# Patient Record
Sex: Male | Born: 1940 | Race: White | Hispanic: No | Marital: Married | Smoking: Former smoker
Health system: Southern US, Community
[De-identification: ages and names within clinical notes are randomized; demographics above are authoritative.]

## PROBLEM LIST (undated history)

## (undated) DIAGNOSIS — I2510 Atherosclerotic heart disease of native coronary artery without angina pectoris: Secondary | ICD-10-CM

## (undated) DIAGNOSIS — J189 Pneumonia, unspecified organism: Secondary | ICD-10-CM

## (undated) DIAGNOSIS — I10 Essential (primary) hypertension: Secondary | ICD-10-CM

## (undated) DIAGNOSIS — I350 Nonrheumatic aortic (valve) stenosis: Secondary | ICD-10-CM

## (undated) DIAGNOSIS — R011 Cardiac murmur, unspecified: Secondary | ICD-10-CM

## (undated) DIAGNOSIS — Z9289 Personal history of other medical treatment: Secondary | ICD-10-CM

## (undated) DIAGNOSIS — K922 Gastrointestinal hemorrhage, unspecified: Secondary | ICD-10-CM

## (undated) DIAGNOSIS — IMO0002 Reserved for concepts with insufficient information to code with codable children: Secondary | ICD-10-CM

## (undated) DIAGNOSIS — Z992 Dependence on renal dialysis: Secondary | ICD-10-CM

## (undated) DIAGNOSIS — Z955 Presence of coronary angioplasty implant and graft: Secondary | ICD-10-CM

## (undated) DIAGNOSIS — N186 End stage renal disease: Secondary | ICD-10-CM

## (undated) DIAGNOSIS — I4891 Unspecified atrial fibrillation: Secondary | ICD-10-CM

## (undated) DIAGNOSIS — D649 Anemia, unspecified: Secondary | ICD-10-CM

## (undated) DIAGNOSIS — I219 Acute myocardial infarction, unspecified: Secondary | ICD-10-CM

## (undated) DIAGNOSIS — K5520 Angiodysplasia of colon without hemorrhage: Secondary | ICD-10-CM

## (undated) DIAGNOSIS — K222 Esophageal obstruction: Secondary | ICD-10-CM

## (undated) DIAGNOSIS — K219 Gastro-esophageal reflux disease without esophagitis: Secondary | ICD-10-CM

## (undated) DIAGNOSIS — E7800 Pure hypercholesterolemia, unspecified: Secondary | ICD-10-CM

## (undated) DIAGNOSIS — M109 Gout, unspecified: Secondary | ICD-10-CM

## (undated) DIAGNOSIS — I701 Atherosclerosis of renal artery: Secondary | ICD-10-CM

## (undated) DIAGNOSIS — M1990 Unspecified osteoarthritis, unspecified site: Secondary | ICD-10-CM

## (undated) HISTORY — PX: OTHER SURGICAL HISTORY: SHX169

## (undated) HISTORY — DX: Acute myocardial infarction, unspecified: I21.9

## (undated) HISTORY — PX: DIALYSIS FISTULA CREATION: SHX611

## (undated) HISTORY — DX: Gastro-esophageal reflux disease without esophagitis: K21.9

## (undated) HISTORY — PX: ESOPHAGOGASTRODUODENOSCOPY (EGD) WITH ESOPHAGEAL DILATION: SHX5812

## (undated) HISTORY — PX: CORONARY ANGIOPLASTY WITH STENT PLACEMENT: SHX49

## (undated) HISTORY — DX: Angiodysplasia of colon without hemorrhage: K55.20

## (undated) HISTORY — DX: Atherosclerosis of renal artery: I70.1

## (undated) HISTORY — DX: Nonrheumatic aortic (valve) stenosis: I35.0

## (undated) HISTORY — DX: Unspecified osteoarthritis, unspecified site: M19.90

## (undated) HISTORY — DX: Anemia, unspecified: D64.9

## (undated) HISTORY — DX: Pure hypercholesterolemia, unspecified: E78.00

## (undated) HISTORY — PX: DOPPLER ECHOCARDIOGRAPHY: SHX263

## (undated) HISTORY — PX: URETERAL STENT PLACEMENT: SHX822

## (undated) HISTORY — DX: Essential (primary) hypertension: I10

## (undated) HISTORY — DX: Cardiac murmur, unspecified: R01.1

---

## 1996-08-22 HISTORY — PX: RENAL ARTERY STENT: SHX2321

## 2003-07-24 ENCOUNTER — Ambulatory Visit (HOSPITAL_COMMUNITY): Admission: RE | Admit: 2003-07-24 | Discharge: 2003-07-24 | Payer: Self-pay | Admitting: Pulmonary Disease

## 2003-12-28 ENCOUNTER — Encounter: Admission: RE | Admit: 2003-12-28 | Discharge: 2003-12-28 | Payer: Self-pay | Admitting: Cardiovascular Disease

## 2004-01-02 ENCOUNTER — Ambulatory Visit (HOSPITAL_COMMUNITY): Admission: RE | Admit: 2004-01-02 | Discharge: 2004-01-02 | Payer: Self-pay | Admitting: Cardiovascular Disease

## 2004-01-02 HISTORY — PX: RENAL ANGIOGRAM: SHX6061

## 2004-02-20 ENCOUNTER — Ambulatory Visit (HOSPITAL_COMMUNITY): Admission: RE | Admit: 2004-02-20 | Discharge: 2004-02-20 | Payer: Self-pay | Admitting: Vascular Surgery

## 2005-09-12 ENCOUNTER — Emergency Department (HOSPITAL_COMMUNITY): Admission: EM | Admit: 2005-09-12 | Discharge: 2005-09-12 | Payer: Self-pay | Admitting: Emergency Medicine

## 2007-03-24 ENCOUNTER — Encounter (HOSPITAL_COMMUNITY): Admission: RE | Admit: 2007-03-24 | Discharge: 2007-06-22 | Payer: Self-pay | Admitting: Nephrology

## 2009-03-19 ENCOUNTER — Ambulatory Visit (HOSPITAL_COMMUNITY): Admission: RE | Admit: 2009-03-19 | Discharge: 2009-03-19 | Payer: Self-pay | Admitting: Nephrology

## 2009-04-02 ENCOUNTER — Ambulatory Visit (HOSPITAL_COMMUNITY): Admission: RE | Admit: 2009-04-02 | Discharge: 2009-04-02 | Payer: Self-pay | Admitting: Nephrology

## 2009-04-13 HISTORY — PX: COLONOSCOPY: SHX174

## 2009-04-16 ENCOUNTER — Inpatient Hospital Stay (HOSPITAL_COMMUNITY): Admission: RE | Admit: 2009-04-16 | Discharge: 2009-04-17 | Payer: Self-pay | Admitting: Nephrology

## 2009-04-17 ENCOUNTER — Encounter (INDEPENDENT_AMBULATORY_CARE_PROVIDER_SITE_OTHER): Payer: Self-pay | Admitting: Nephrology

## 2009-10-01 DIAGNOSIS — Z955 Presence of coronary angioplasty implant and graft: Secondary | ICD-10-CM

## 2009-10-01 HISTORY — DX: Presence of coronary angioplasty implant and graft: Z95.5

## 2010-03-13 HISTORY — PX: ESOPHAGOGASTRODUODENOSCOPY: SHX1529

## 2010-03-15 ENCOUNTER — Encounter: Payer: Self-pay | Admitting: Emergency Medicine

## 2010-03-15 ENCOUNTER — Inpatient Hospital Stay (HOSPITAL_COMMUNITY)
Admission: EM | Admit: 2010-03-15 | Discharge: 2010-03-21 | Payer: Self-pay | Source: Home / Self Care | Attending: Cardiovascular Disease | Admitting: Cardiovascular Disease

## 2010-04-08 ENCOUNTER — Encounter (HOSPITAL_COMMUNITY)
Admission: RE | Admit: 2010-04-08 | Discharge: 2010-05-08 | Payer: Self-pay | Source: Home / Self Care | Attending: Family Medicine | Admitting: Family Medicine

## 2010-04-08 ENCOUNTER — Ambulatory Visit
Admission: RE | Admit: 2010-04-08 | Discharge: 2010-04-08 | Payer: Self-pay | Source: Home / Self Care | Attending: Internal Medicine | Admitting: Internal Medicine

## 2010-04-09 ENCOUNTER — Telehealth (INDEPENDENT_AMBULATORY_CARE_PROVIDER_SITE_OTHER): Payer: Self-pay

## 2010-04-09 ENCOUNTER — Ambulatory Visit (HOSPITAL_COMMUNITY)
Admission: RE | Admit: 2010-04-09 | Discharge: 2010-05-13 | Payer: Self-pay | Source: Home / Self Care | Attending: Family Medicine | Admitting: Family Medicine

## 2010-04-09 ENCOUNTER — Encounter: Payer: Self-pay | Admitting: Internal Medicine

## 2010-04-11 ENCOUNTER — Ambulatory Visit (HOSPITAL_COMMUNITY)
Admission: RE | Admit: 2010-04-11 | Discharge: 2010-04-11 | Payer: Self-pay | Source: Home / Self Care | Attending: Internal Medicine | Admitting: Internal Medicine

## 2010-04-15 ENCOUNTER — Encounter (INDEPENDENT_AMBULATORY_CARE_PROVIDER_SITE_OTHER): Payer: Self-pay

## 2010-04-15 ENCOUNTER — Encounter: Payer: Self-pay | Admitting: Urgent Care

## 2010-04-15 ENCOUNTER — Encounter: Payer: Self-pay | Admitting: Internal Medicine

## 2010-04-16 HISTORY — PX: GIVENS CAPSULE STUDY: SHX5432

## 2010-04-22 ENCOUNTER — Encounter: Payer: Self-pay | Admitting: Urgent Care

## 2010-04-24 IMAGING — CR DG CHEST 2V
2 series · 2 of 2 positions shown · non-contrast
Comparison: 04/02/2009

CLINICAL DATA: Pneumonia.  Cough.  Short of breath.  Anemia.

CHEST - 2 VIEW

[w chest pa]
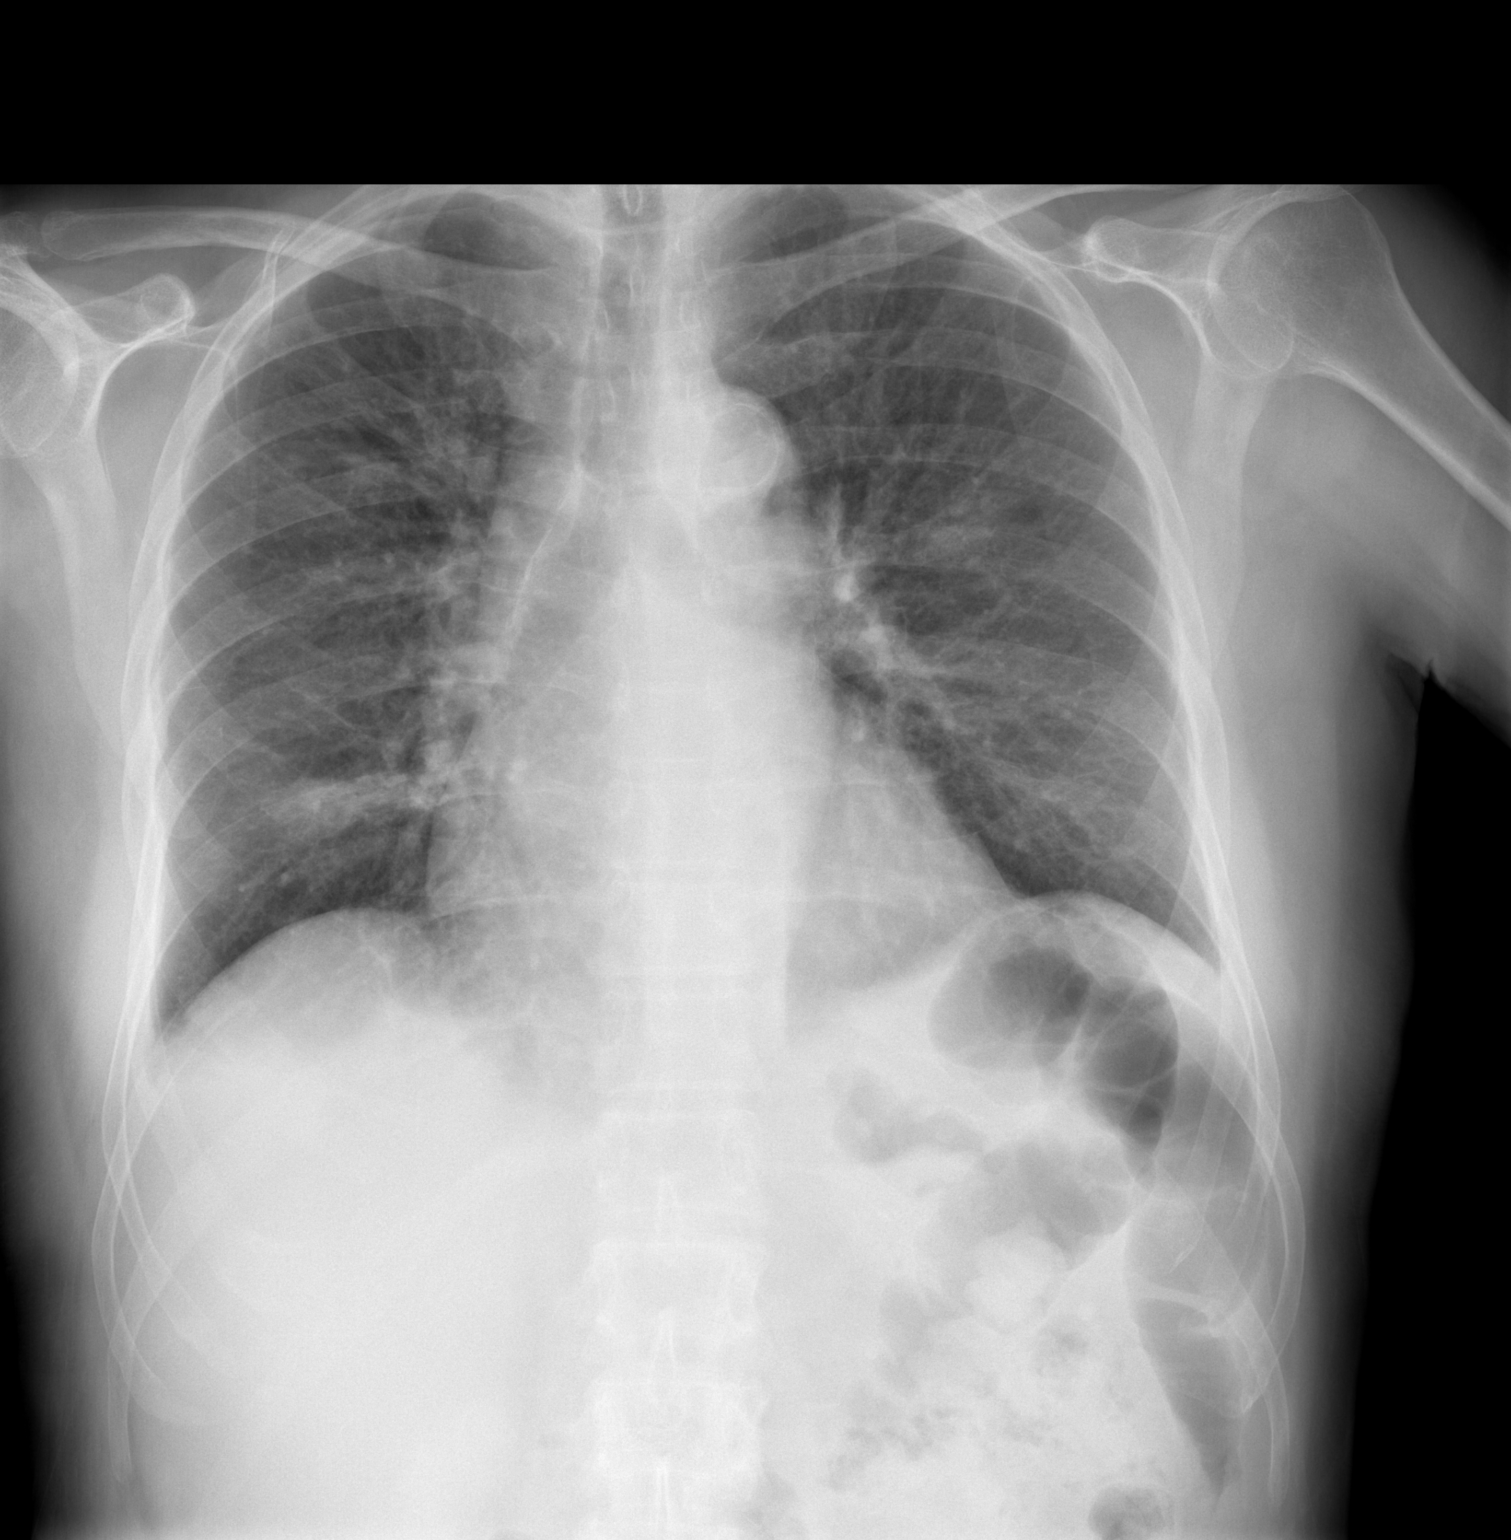

[w chest lat]
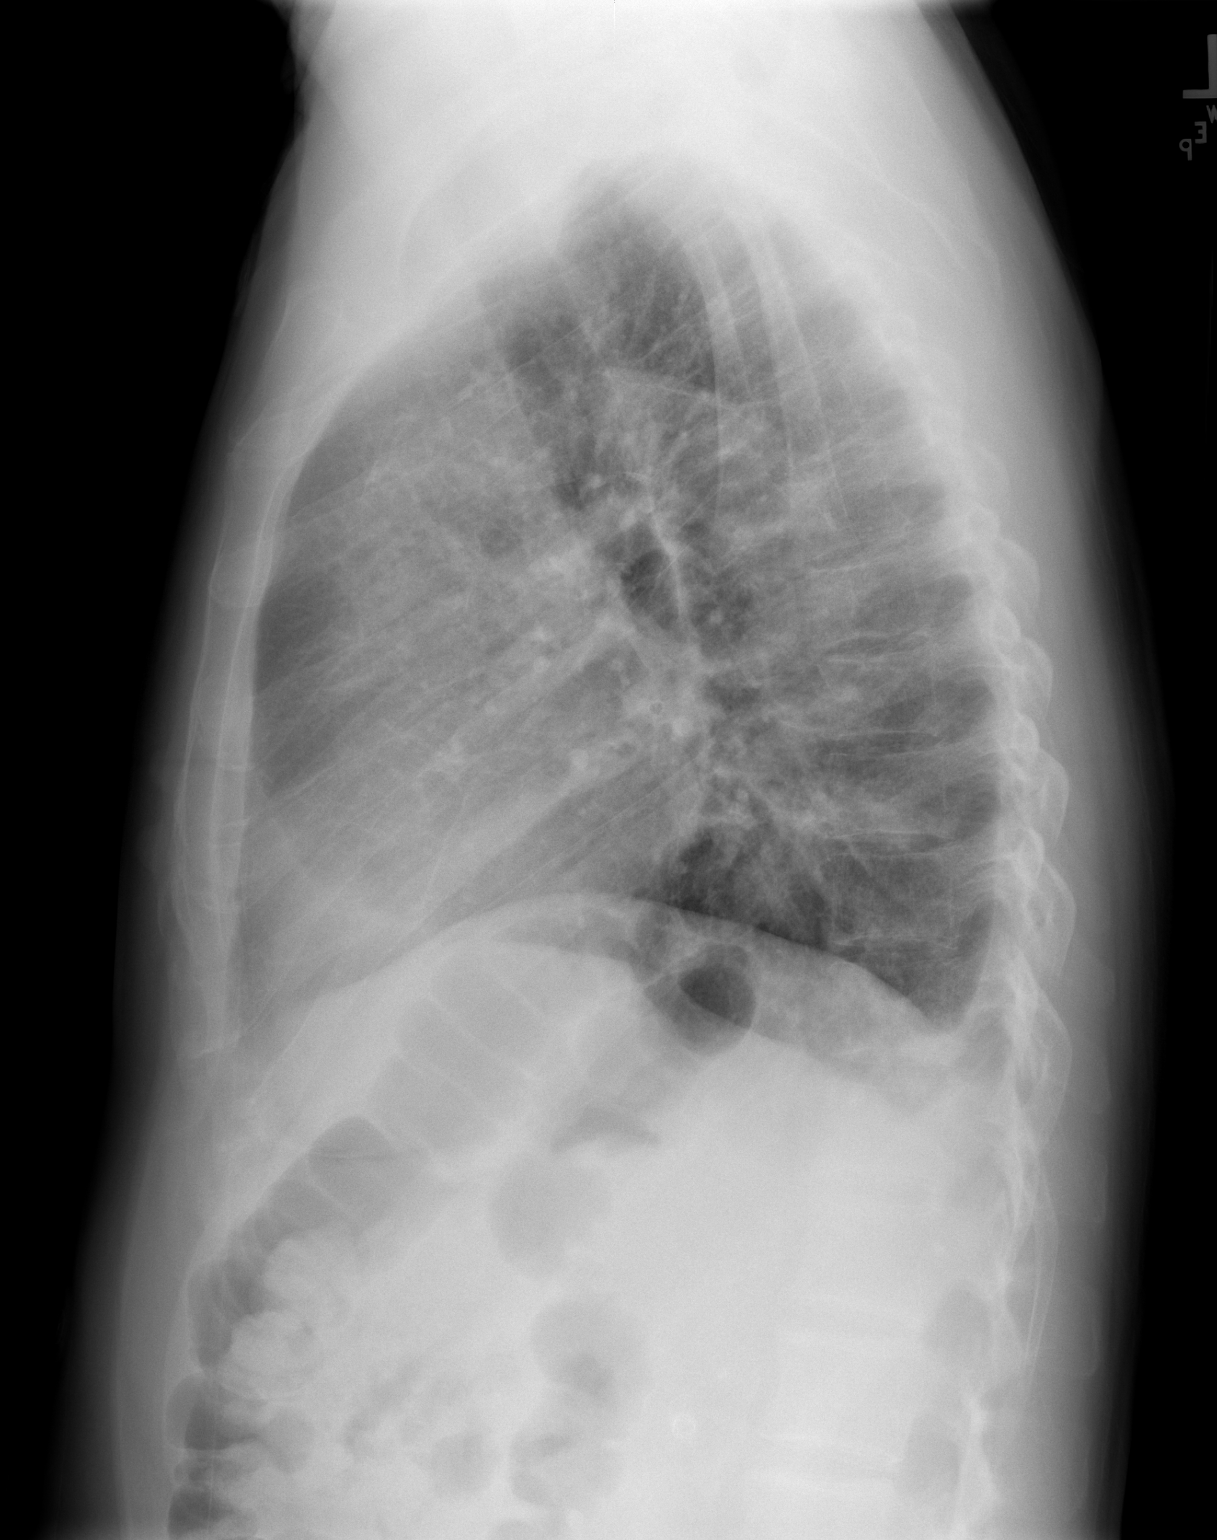

[2 of 2 positions shown; findings below may reference images not displayed]

FINDINGS: Mild infiltrate persists in the right lower lobe, and is
without significant change.  Tiny right pleural effusion is also
unchanged.  Left lung remains clear.  Heart size and mediastinal
contours are stable and within normal limits.
IMPRESSION: Mild right lower lobe infiltrate and tiny right pleural effusion,
without significant interval change.

## 2010-05-02 HISTORY — PX: NM MYOCAR PERF WALL MOTION: HXRAD629

## 2010-05-06 ENCOUNTER — Encounter (INDEPENDENT_AMBULATORY_CARE_PROVIDER_SITE_OTHER): Payer: Self-pay | Admitting: *Deleted

## 2010-05-13 NOTE — Op Note (Signed)
  NAME:  Roger [name], Roger [name]                  ACCOUNT NO.:  [id number]  MEDICAL RECORD NO.:  [id number]          PATIENT TYPE:  AMB  LOCATION:  DAY                           FACILITY:  [facility]  PHYSICIAN:  R. Roetta Sessions, M.D. DATE OF BIRTH:  [birth year]  DATE OF PROCEDURE:  04/11/2010 DATE OF DISCHARGE:  04/11/2010                              OPERATIVE REPORT   REFERRING PHYSICIAN:  R. Roetta Sessions, MD  PRIMARY CARE PHYSICIAN:  Angus G. McInnis, MD  PROCEDURE:  Small bowel Givens capsule study.  INDICATIONS FOR PROCEDURE:  Roger [name] is a 70 year old male with history of chronic renal failure.  He has been on aspirin, Plavix, and was taking NSAIDs as well.  He was seen further to evaluate for obscure GI bleed after an upper endoscopy in [place] on March 20, 2011, was found to have a small peripyloric ulcers and Schatzki ring.  His last colonoscopy was in January 2011, where he was found to have hemorrhoids. He previously had a negative bleeding scan.  FINDINGS:  First gastric image is at 0 seconds, gastric passage time is 26 minutes, first image is at 26 minutes and 44 seconds, at 1 hour, 29 minutes, and 6 seconds.  He has an erosion versus AVM which is nonbleeding at 1 hour, 32 minutes, and 19 seconds.  There is evidence of somewhat edematous small bowel mucosa.  There is no evidence of stricture or mass.  This persisted throughout the remainder of the small bowel.  He also has multiple petechiae beginning around 2 hours 14 minutes and persisting to the remainder of the small bowel.  There is no evidence of mass, stricture, bleeding, or ulceration.  The small bowel passage time is 2 hours, 3 minutes.  First ileocecal image at 2 hours, 29 minutes, and 54 seconds.  First cecal image at 2 hours, 29 minutes, and 54 seconds.  IMPRESSION:  He has a small bowel erosions versus AVM and nonspecific distal small bowel inflammation in the setting of anemia of chronic disease along with  aspirin, Plavix, and NSAIDs.  RECOMMENDATIONS: 1. We will continue to follow H and H. 2. Continue aspirin and Plavix. 3. Avoid all NSAIDs.     Lorenza Burton, N.P.   ______________________________ R. Roetta Sessions, M.D.    Dustin Folks  D:  04/15/2010  T:  04/15/2010  Job:  [id number]  cc:   Angus G. Renard Matter, MD Fax: [phone]  Electronically Signed by Lorenza Burton N.P. on 04/21/2010 07:09:02 PM Electronically Signed by Lorrin Goodell M.D. on 05/13/2010 09:08:13 AM

## 2010-05-15 NOTE — Letter (Signed)
Summary: Recall, Labs Needed  [facility]  [address]   [place], [place] [address]   Phone: [phone]  Fax: [phone]    April 15, 2010  Roger [name] [address] [place], [place]  [address] 10-Jan-1941   Dear [name],   Our records indicate it is time to repeat your blood work.  You can take the enclosed form to the lab on or near the date indicated.  Please make note of the new location of the lab:   [address], 2nd floor   [facility]  Our office will call you within a week to ten business days with the results.  If you do not hear from [place] in 10 business days, you should call the office.  If you have any questions regarding this, call the office at [phone], and ask for the nurse.  Labs are due on 04/29/10.   Sincerely,    Hendricks Limes LPN  [facility] Ph: [phone]   Fax: [phone]

## 2010-05-15 NOTE — Miscellaneous (Signed)
Summary: Orders Update  Clinical Lists Changes  Orders: Added new Test order of T-Hemoglobin and Hematocrit (1005) - Signed 

## 2010-05-15 NOTE — Letter (Addendum)
Summary: LABS  LABS   Imported By: Rexene Alberts 04/22/2010 12:06:32  _____________________________________________________________________  External Attachment:    Type:   Image     Comment:   External Document  Appended Document: LABS Please let pt know Hgb up to 10 OV w/ [place] 3 months or sooner if re-bleeds Have pt send H/H from dialysis in 6 weeks for our review CC:McInnis Thanks  Appended Document: LABS Pt aware.  Appended Document: LABS Lab order faxed to [name].  Appended Document: LABS pt aware fo appt 2/13 @ 2pm w/KJ

## 2010-05-15 NOTE — Letter (Signed)
Summary: GIVNES CAPSULE ORDER  GIVNES CAPSULE ORDER   Imported By: Ave Filter 04/09/2010 09:44:25  _____________________________________________________________________  External Attachment:    Type:   Image     Comment:   External Document  Appended Document: GIVNES CAPSULE ORDER #[id number] LM for return call Please let pt know: 1) GIVENS shows some erosions/inflammation 2) AVOID all NSAIDS 3) Continue ASA&plavix 4) Needs H/H 2 weeks NW:GNFAOZ 5) OV in 4-6 weeks 6) Call if overt bleeding Thanks  Appended Document: GIVNES CAPSULE ORDER pts wife aware, lab order in mail  Appended Document: GIVNES CAPSULE ORDER reminder in computer

## 2010-05-15 NOTE — Assessment & Plan Note (Signed)
Summary: FU/SS   Visit Type:  Initial Consult Primary Care Provider:  Dr. Megan Mans  CC:  blood in stool.  History of Present Illness: Roger [name] is a pleasant 70 year old male who presents today secondary to ongoing anemia. He has a hx significant for renal failure secondary to hypertension and goes to dialysis Tues, [place], Sat. Had blood drawn today during dialysis, H/H 8.1/25.5. Was going to have transfusion, but blood not ready. Will have it done tomorrow around 10. VSS. Feels somewhat weak, otherwise ok. Recently d/c from [facility] due to MI, requiring admission 12/3-12/9 with cardiac cath and stent on 12/3. When d/c from [facility]  Hgb 9.1. Had EGD with enteroscopy on 12/7, showing small hiatal hernia and a few tiny to small peripyloric ulcers with flat, white bases. +schatzki ring which was widely patent. nuclear bleeding scan negative. last TCS done Jan 2011 only showing internal hemorrhoids.Reports left-sided abdominal discomfort, intermittent, started a few days ago. felt like a 4/10. denies nausea.. Denies hematemesis. no brbpr, +tarry stool at [facility]. Reports continued dark stools but not tarry. "black".reports  rare, intertemiitent  dysphagia, has known Schatzki ring, widely patent on egd on 12/7. . +loss of appetite X 2 mos. Reports 190 a few years ago, had pneumonia last year and dropped lots of weight. now 161. reports NSAIDs use daily prior to hospitalization, secondary to headaches.   Current Medications (verified): 1)  Tylenol 325 Mg Tabs (Acetaminophen) .... Q 4 Hours As Needed 2)  Aspirin 325 Mg Tabs (Aspirin) .... Once Daily 3)  Lipitor 40 Mg Tabs (Atorvastatin Calcium) .... Once Daily 4)  Plavix 75 Mg Tabs (Clopidogrel Bisulfate) .... Once Daily 5)  Imdur 30 Mg Xr24h-Tab (Isosorbide Mononitrate) .... Once Daily 6)  Lopressor 50 Mg Tabs (Metoprolol Tartrate) .... 1/2 Once Daily 7)  Nitrostat 0.4 Mg Subl (Nitroglycerin) .... As Needed 8)  Pantoprazole Sodium 40 Mg Tbec (Pantoprazole Sodium)  .... Once Daily 9)  Allopurinol 100 Mg Tabs (Allopurinol) .... 2 Once Daily 10)  Enalapril Maleate 10 Mg Tabs (Enalapril Maleate) .... Once Daily 11)  Phoslo 667 Mg Caps (Calcium Acetate (Phos Binder)) .... Take 4 Caps Three Times A Day and 1 Cap With Snacks Two Times A Day 12)  Tramadol Hcl 50 Mg Tabs (Tramadol Hcl) .... Two Times A Day As Needed  Allergies (verified): No Known Drug Allergies  Past History:  Past Medical History: Hypertension arthritis hypercholesterolemia kidney failure secondary to HTN MI GERD EGD/TCS Jan 2011 with [name]: EGD: Moderately large hiatal hernia which could conceivably contribute       to his anemia. 2. Schatzki ring, which might account for his occasional intermittent       dysphagia symptoms. TCS:  No source of heme-positive stool or anemia identified.  2. Family history of colon cancer, without worrisome findings on  current examination. +internal hemorrhoids. EGD Dec 2011 by [name]:  ENDOSCOPIC DIAGNOSES:   1. Small hiatal hernia with a widely patent fibrous ring.   2. Few tiny to small peripyloric ulcers with nice flat white bases and       no bleeding stigmata.   3. Minimal bulbitis.   4. Otherwise within normal limits EGD and pediatric colonoscope past       the ligament of Treitz 160 cm from the mouth down the jejunum       without signs of bleeding.      Past Surgical History: ureteral stent  Family History: Mother: heart disease, HTN, deceased Father: heart disease, HTN, deceased  2 brothers, 1 sister: heart disease  brother: colon cancer, deceased age 73  Social History: Retired: Production assistant, [employer] Married X 47 years 3 children Patient is a former smoker. quit in 1994, 1ppd Alcohol Use - no Smoking Status:  quit  Review of Systems General:  Denies fever, chills, and anorexia. Eyes:  Denies blurring, irritation, and discharge. ENT:  Complains of difficulty swallowing; denies sore throat and hoarseness. CV:  Denies  chest pains and syncope. Resp:  Denies dyspnea at rest and wheezing. GI:  Complains of difficulty swallowing, abdominal pain, and black BMs; denies pain on swallowing, nausea, indigestion/heartburn, change in bowel habits, and bloody BM's. GU:  Denies urinary burning and urinary frequency. MS:  Denies joint pain / LOM, joint swelling, and joint stiffness. Derm:  Denies rash, itching, and dry skin. Neuro:  Denies weakness and syncope. Psych:  Denies depression and anxiety. Endo:  Denies cold intolerance and heat intolerance. Heme:  Denies bruising and bleeding.  Vital Signs:  Patient profile:   70 year old male Height:      69 inches Weight:      161 pounds BMI:     23.86 Temp:     98.7 degrees F oral Pulse rate:   68 / minute BP sitting:   148 / 82  (left arm) Cuff size:   regular  Vitals Entered By: Hendricks Limes LPN (April 08, 2010 2:52 PM)  Physical Exam  General:  Well developed, well nourished, no acute distress. Head:  Normocephalic and atraumatic. Eyes:  sclera without icterus, conjuctiva clear Mouth:  No deformity or lesions, dentition normal. Lungs:  Clear throughout to auscultation. Heart:  Regular rate and rhythm; no murmurs, rubs,  or bruits. Abdomen:  normal bowel sounds, obese, without guarding, without rebound, no distesion, no tenderness, and no hepatomegally or splenomegaly.   Msk:  Symmetrical with no gross deformities. Normal posture. Pulses:  Normal pulses noted. Extremities:  No clubbing, cyanosis, edema or deformities noted. Neurologic:  Alert and  oriented x4;  grossly normal neurologically.  Impression & Recommendations:  Problem # 1:  ANEMIA-UNSPECIFIED (ICD-75.8)  70 year old male with hx of renal disease requiring dialysis Tues, Thurs, Sat, recently d/c from [facility] after MI, stent placement. While admitted, underwent EGD with enteroscopy secondary to anemia, as well as nuclear bleeding scan. Notable drop in Hgb today at dialysis down to 8.1. Was  9.1 at discharge on 12/9. Prior to this, TCS/EGD done in Jan 2011 secondary to heme +stools. Likely component of anemia is r/t chronic disease; however, significant drop in hgb warrants further investigation. Will proceed with GIVENS capsule study to assess for any small bowel etiology.   capsule study further recommendations to follow Pt to receive blood from dialysis 12/28.   Orders: Consultation Level III [phone])

## 2010-05-15 NOTE — Progress Notes (Signed)
----   Converted from flag ---- ---- 04/09/2010 7:58 AM, Gerrit Halls NP wrote: Cleone Slim! This pt needs to be set up for GIVENS. Doris or Raynelle Fanning, can one of you call him this morning before 10ish? He is getting some blood around that time. Anna ------------------------------  Appended Document:  Pt aware the appt will be made.  Appended Document:  Pt scheduled for Givens 04/11/10@7 :30a.m.

## 2010-05-15 NOTE — Letter (Signed)
Summary: Scheduled Appointment  [facility]  [address]   [place], [place] [address]   Phone: [phone]  Fax: [phone]    May 06, 2010   Dear: Roger [name]            DOB: 07-Oct-1940    I have been instructed to schedule you an appointment in our office.  Your appointment is as follows:   Date:           May 26, 2010   Time:           2PM     Please be here 15 minutes early.   Provider:      Lorenza Burton, FNP-BS    Please contact the office if you need to reschedule this appointment for a more convenient time.   Thank you,    Diana Eves       [facility] Ph: [phone]   Fax: [phone]

## 2010-05-26 ENCOUNTER — Encounter: Payer: Self-pay | Admitting: Urgent Care

## 2010-05-26 ENCOUNTER — Encounter: Payer: Self-pay | Admitting: *Deleted

## 2010-05-26 ENCOUNTER — Ambulatory Visit: Payer: Medicare Other | Admitting: Urgent Care

## 2010-05-26 DIAGNOSIS — D649 Anemia, unspecified: Secondary | ICD-10-CM

## 2010-05-26 DIAGNOSIS — R1319 Other dysphagia: Secondary | ICD-10-CM

## 2010-06-04 NOTE — Assessment & Plan Note (Signed)
Summary: Anemia   Visit Type:  Follow-up Visit Primary Care Provider:  McInnis  Chief Complaint:  F/U Roger [name].  History of Present Illness: cc: Dr Roger [name]  Hx anemia/obscure GI bleed.  Last hgb 12 he believes 2-3 wks ago at dialysis [facility]).  We requested records.  Denies rectal bleeding or melena.  Denies abd pain, heartburn, indigestion, nausea or vomiting.  Wt steadily increasing.  Appetite ok.  Denies fever/chills.  Occ epistaxis every few days a couple months now in small amts.  On plavix & ASA.  Was previously on celebrex, but has d/c'd this.  1/412 SB Given's capsule->small bowel erosions versus AVM, nonspecific distal small bowel inflammation in the setting of anemia of chronic disease along with aspirin, Plavix, and NSAIDs.  c/o dysphagia, feels like food gets stuck in upper esophagus, Schatski's ring.  On protonix two times a day.  Does not want Esophageal dilation at this time.      Current Medications (verified): 1)  Tylenol 325 Mg Tabs (Acetaminophen) .... Q 4 Hours As Needed 2)  Aspirin 325 Mg Tabs (Aspirin) .... Once Daily 3)  Lipitor 40 Mg Tabs (Atorvastatin Calcium) .... Once Daily 4)  Plavix 75 Mg Tabs (Clopidogrel Bisulfate) .... Once Daily 5)  Imdur 30 Mg Xr24h-Tab (Isosorbide Mononitrate) .... Once Daily 6)  Lopressor 50 Mg Tabs (Metoprolol Tartrate) .... 1/2 Once Daily 7)  Nitrostat 0.4 Mg Subl (Nitroglycerin) .... As Needed 8)  Pantoprazole Sodium 40 Mg Tbec (Pantoprazole Sodium) .... One Tablet Twice Daily 9)  Allopurinol 100 Mg Tabs (Allopurinol) .... 2 Once Daily 10)  Enalapril Maleate 10 Mg Tabs (Enalapril Maleate) .... Once Daily 11)  Phoslo 667 Mg Caps (Calcium Acetate (Phos Binder)) .... Take 4 Caps Three Times A Day and 1 Cap With Snacks Two Times A Day 12)  Renavite .... One Tablet Daily  Allergies (verified): No Known Drug Allergies  Past History:  Past Medical History: Hypertension arthritis hypercholesterolemia kidney  failure secondary to HTN MI GERD 1/412 SB Given's capsule->small bowel erosions versus AVM, nonspecific distal small bowel inflammation in the setting of anemia of chronic disease along with aspirin, Plavix, and NSAIDs. EGD/TCS Jan 2011 with Dr. Matthias [name]: EGD: Moderately large hiatal hernia which could conceivably contribute       to his anemia. 2. Schatzki ring, which might account for his occasional intermittent       dysphagia symptoms. TCS:  No source of heme-positive stool or anemia identified.  2. Family history of colon cancer, without worrisome findings on  current examination. +internal hemorrhoids. EGD Dec 2011 by Dr. Ewing [name]:  ENDOSCOPIC DIAGNOSES:   1. Small hiatal hernia with a widely patent fibrous ring.   2. Few tiny to small peripyloric ulcers with nice flat white bases and       no bleeding stigmata.   3. Minimal bulbitis.   4. Otherwise within normal limits EGD and pediatric colonoscope past       the ligament of Treitz 160 cm from the mouth down the jejunum       without signs of bleeding.      Past Surgical History: Reviewed history from 04/08/2010 and no changes required. ureteral stent  Review of Systems General:  Denies fever, chills, sweats, anorexia, fatigue, weakness, malaise, weight loss, and sleep disorder. ENT:  See HPI; Complains of nosebleeds. CV:  Denies chest pains, angina, palpitations, syncope, dyspnea on exertion, orthopnea, PND, peripheral edema, and claudication. Resp:  Denies dyspnea at rest, dyspnea with exercise, cough, sputum,  wheezing, coughing up blood, and pleurisy. GI:  See HPI; Denies jaundice. GU:  Denies urinary burning, blood in urine, urinary frequency, urinary hesitancy, nocturnal urination, and urinary incontinence. MS:  Denies joint pain / LOM, joint swelling, joint stiffness, joint deformity, low back pain, muscle weakness, muscle cramps, muscle atrophy, leg pain at night, leg pain with exertion, and shoulder pain / LOM hand / wrist pain  (CTS). Derm:  Denies rash, itching, dry skin, hives, moles, warts, and unhealing ulcers. Psych:  Denies depression, anxiety, memory loss, suicidal ideation, hallucinations, paranoia, phobia, and confusion. Heme:  Denies bruising and enlarged lymph nodes.  Vital Signs:  Patient profile:   70 year old male Height:      69 inches Weight:      167.50 pounds BMI:     24.82 Temp:     98.3 degrees F oral Pulse rate:   68 / minute BP sitting:   150 / 78  (left arm) Cuff size:   regular  Vitals Entered By: Cloria Spring LPN (May 26, 2010 2:16 PM)  Physical Exam  General:  Well developed, well nourished, no acute distress. Head:  Normocephalic and atraumatic. Eyes:  sclera without icterus, conjuctiva clear Mouth:  No deformity or lesions, dentition normal. Neck:  Supple; no masses or thyromegaly. Heart:  Regular rate and rhythm; no murmurs, rubs,  or bruits. Abdomen:  normal bowel sounds, obese, without guarding, without rebound, no distesion, no tenderness, and no hepatomegally or splenomegaly.   Msk:  Symmetrical with no gross deformities. Normal posture. Pulses:  Normal pulses noted. Extremities:  No clubbing, cyanosis, edema or deformities noted. Neurologic:  Alert and  oriented x4;  grossly normal neurologically. Skin:  Intact without significant lesions or rashes. Cervical Nodes:  No significant cervical adenopathy. Psych:  Alert and cooperative. Normal mood and affect.  Impression & Recommendations:  Problem # 1:  ANEMIA-UNSPECIFIED (ICD-285.9) 70 y/o caucasian male w/ CKD & GI bleed in the setting of NSAIDs, plavix & asa. Hx PUD on EGD & SB erosions/ mild inflammation on recent Roger [name], but no active bleeding.  Pt believes last hgb stable.  Recurrent epistaxis may be contributing to chronic anemia.  Orders: Est. Patient Level III (84696)  Problem # 2:  OTHER DYSPHAGIA (ICD-787.29) Dysphagia in setting of known Schatzki's ring.  Offered EGD w/ esophageal dilation, pt  declined.    Patient Instructions: 1)  FU with Dr Roger [name] & your cardiologist re: nosebleeds on plavix & ASA 2)  Avoid all NSAIDs 3)  If you decide you want EGD w/ dilation, call us back 4)  Level 4-5 dysphagia diet

## 2010-06-23 LAB — CARDIAC PANEL(CRET KIN+CKTOT+MB+TROPI)
CK, MB: 34.4 ng/mL (ref 0.3–4.0)
Relative Index: 8.9 — ABNORMAL HIGH (ref 0.0–2.5)
Relative Index: 9.3 — ABNORMAL HIGH (ref 0.0–2.5)
Total CK: 1866 U/L — ABNORMAL HIGH (ref 7–232)
Troponin I: >100 ng/mL (ref 0.00–0.06)

## 2010-06-23 LAB — HEMOGLOBIN A1C
Hgb A1c MFr Bld: 4.8 % (ref <5.7)
Mean Plasma Glucose: 91 mg/dL (ref <117)

## 2010-06-23 LAB — RENAL FUNCTION PANEL
Albumin: 2.7 g/dL — ABNORMAL LOW (ref 3.5–5.2)
BUN: 66 mg/dL — ABNORMAL HIGH (ref 6–23)
BUN: 81 mg/dL — ABNORMAL HIGH (ref 6–23)
CO2: 29 mEq/L (ref 19–32)
Calcium: 8.6 mg/dL (ref 8.4–10.5)
Chloride: 93 mEq/L — ABNORMAL LOW (ref 96–112)
Creatinine, Ser: 9.71 mg/dL — ABNORMAL HIGH (ref 0.4–1.5)
GFR calc Af Amer: 7 mL/min — ABNORMAL LOW (ref >60)
Phosphorus: 3 mg/dL (ref 2.3–4.6)
Potassium: 5.5 mEq/L — ABNORMAL HIGH (ref 3.5–5.1)
Potassium: 5.7 mEq/L — ABNORMAL HIGH (ref 3.5–5.1)

## 2010-06-23 LAB — CBC
HCT: 26.9 % — ABNORMAL LOW (ref 39.0–52.0)
HCT: 35.6 % — ABNORMAL LOW (ref 39.0–52.0)
Hemoglobin: 11.4 g/dL — ABNORMAL LOW (ref 13.0–17.0)
Hemoglobin: 8.6 g/dL — ABNORMAL LOW (ref 13.0–17.0)
Hemoglobin: 9.1 g/dL — ABNORMAL LOW (ref 13.0–17.0)
MCH: 36.3 pg — ABNORMAL HIGH (ref 26.0–34.0)
MCH: 36.3 pg — ABNORMAL HIGH (ref 26.0–34.0)
MCH: 36.4 pg — ABNORMAL HIGH (ref 26.0–34.0)
MCH: 37.1 pg — ABNORMAL HIGH (ref 26.0–34.0)
MCHC: 32.5 g/dL (ref 30.0–36.0)
MCHC: 33.7 g/dL (ref 30.0–36.0)
MCHC: 33.9 g/dL (ref 30.0–36.0)
MCV: 106.7 fL — ABNORMAL HIGH (ref 78.0–100.0)
MCV: 114.2 fL — ABNORMAL HIGH (ref 78.0–100.0)
Platelets: 154 10*3/uL (ref 150–400)
Platelets: 159 10*3/uL (ref 150–400)
Platelets: 164 10*3/uL (ref 150–400)
Platelets: 168 10*3/uL (ref 150–400)
Platelets: 193 10*3/uL (ref 150–400)
RBC: 2.36 MIL/uL — ABNORMAL LOW (ref 4.22–5.81)
RBC: 2.39 MIL/uL — ABNORMAL LOW (ref 4.22–5.81)
RBC: 2.4 MIL/uL — ABNORMAL LOW (ref 4.22–5.81)
RBC: 2.51 MIL/uL — ABNORMAL LOW (ref 4.22–5.81)
RDW: 14.9 % (ref 11.5–15.5)
RDW: 16.9 % — ABNORMAL HIGH (ref 11.5–15.5)
WBC: 5.2 10*3/uL (ref 4.0–10.5)
WBC: 6.7 10*3/uL (ref 4.0–10.5)

## 2010-06-23 LAB — LIPID PANEL
Cholesterol: 145 mg/dL (ref 0–200)
LDL Cholesterol: 79 mg/dL (ref 0–99)
Triglycerides: 177 mg/dL — ABNORMAL HIGH (ref <150)
VLDL: 35 mg/dL (ref 0–40)

## 2010-06-23 LAB — BASIC METABOLIC PANEL
BUN: 24 mg/dL — ABNORMAL HIGH (ref 6–23)
BUN: 86 mg/dL — ABNORMAL HIGH (ref 6–23)
CO2: 28 mEq/L (ref 19–32)
Calcium: 8.4 mg/dL (ref 8.4–10.5)
Calcium: 8.8 mg/dL (ref 8.4–10.5)
Calcium: 8.9 mg/dL (ref 8.4–10.5)
Chloride: 88 mEq/L — ABNORMAL LOW (ref 96–112)
Creatinine, Ser: 10.64 mg/dL — ABNORMAL HIGH (ref 0.4–1.5)
Creatinine, Ser: 6.23 mg/dL — ABNORMAL HIGH (ref 0.4–1.5)
Creatinine, Ser: 9.2 mg/dL — ABNORMAL HIGH (ref 0.4–1.5)
GFR calc Af Amer: 6 mL/min — ABNORMAL LOW (ref >60)
GFR calc non Af Amer: 5 mL/min — ABNORMAL LOW (ref >60)
GFR calc non Af Amer: 9 mL/min — ABNORMAL LOW (ref >60)
Glucose, Bld: 94 mg/dL (ref 70–99)
Potassium: 4.9 mEq/L (ref 3.5–5.1)
Potassium: 6.8 mEq/L (ref 3.5–5.1)
Sodium: 129 mEq/L — ABNORMAL LOW (ref 135–145)

## 2010-06-23 LAB — CROSSMATCH
ABO/RH(D): O POS
PT AG Type: NEGATIVE
Unit division: 0
Unit division: 0

## 2010-06-23 LAB — MRSA PCR SCREENING: MRSA by PCR: NEGATIVE

## 2010-06-23 LAB — HEMOGLOBIN AND HEMATOCRIT, BLOOD
HCT: 26.4 % — ABNORMAL LOW (ref 39.0–52.0)
Hemoglobin: 8.7 g/dL — ABNORMAL LOW (ref 13.0–17.0)

## 2010-06-23 LAB — HEMOCCULT GUIAC POC 1CARD (OFFICE): Fecal Occult Bld: POSITIVE

## 2010-06-24 LAB — PROTIME-INR: INR: 1 (ref 0.00–1.49)

## 2010-06-24 LAB — CBC
HCT: 43.2 % (ref 39.0–52.0)
MCV: 112.8 fL — ABNORMAL HIGH (ref 78.0–100.0)
Platelets: 212 10*3/uL (ref 150–400)
RBC: 3.83 MIL/uL — ABNORMAL LOW (ref 4.22–5.81)
RDW: 15.2 % (ref 11.5–15.5)
WBC: 6.1 10*3/uL (ref 4.0–10.5)

## 2010-06-24 LAB — COMPREHENSIVE METABOLIC PANEL
ALT: 16 U/L (ref 0–53)
Albumin: 4.1 g/dL (ref 3.5–5.2)
Alkaline Phosphatase: 116 U/L (ref 39–117)
Chloride: 93 mEq/L — ABNORMAL LOW (ref 96–112)
Potassium: 3.6 mEq/L (ref 3.5–5.1)
Sodium: 140 mEq/L (ref 135–145)
Total Bilirubin: 0.5 mg/dL (ref 0.3–1.2)
Total Protein: 8.7 g/dL — ABNORMAL HIGH (ref 6.0–8.3)

## 2010-06-24 LAB — POCT CARDIAC MARKERS
CKMB, poc: 1.2 ng/mL (ref 1.0–8.0)
Myoglobin, poc: 432 ng/mL (ref 12–200)
Troponin i, poc: <0.05 ng/mL (ref 0.00–0.09)

## 2010-06-29 LAB — RENAL FUNCTION PANEL
Albumin: 2.9 g/dL — ABNORMAL LOW (ref 3.5–5.2)
Albumin: 3 g/dL — ABNORMAL LOW (ref 3.5–5.2)
Chloride: 100 mEq/L (ref 96–112)
Chloride: 96 mEq/L (ref 96–112)
Creatinine, Ser: 9.46 mg/dL — ABNORMAL HIGH (ref 0.4–1.5)
GFR calc Af Amer: 16 mL/min — ABNORMAL LOW (ref >60)
GFR calc Af Amer: 7 mL/min — ABNORMAL LOW (ref >60)
GFR calc non Af Amer: 13 mL/min — ABNORMAL LOW (ref >60)
GFR calc non Af Amer: 6 mL/min — ABNORMAL LOW (ref >60)
Potassium: 3.8 mEq/L (ref 3.5–5.1)
Potassium: 4.4 mEq/L (ref 3.5–5.1)
Sodium: 139 mEq/L (ref 135–145)

## 2010-06-29 LAB — CBC
HCT: 33.5 % — ABNORMAL LOW (ref 39.0–52.0)
Hemoglobin: 9.4 g/dL — ABNORMAL LOW (ref 13.0–17.0)
Platelets: 300 10*3/uL (ref 150–400)
RBC: 2.74 MIL/uL — ABNORMAL LOW (ref 4.22–5.81)
WBC: 5.6 10*3/uL (ref 4.0–10.5)
WBC: 6.7 10*3/uL (ref 4.0–10.5)

## 2010-06-29 LAB — CEA: CEA: 1.7 ng/mL (ref 0.0–5.0)

## 2010-06-29 LAB — CROSSMATCH
ABO/RH(D): O POS
Antibody Screen: POSITIVE
Donor AG Type: NEGATIVE

## 2010-06-29 LAB — HEMOCCULT GUIAC POC 1CARD (OFFICE): Fecal Occult Bld: NEGATIVE

## 2010-08-29 NOTE — Cardiovascular Report (Signed)
NAME:  Roger [name], Roger [name] NO.:  [id number]   MEDICAL RECORD NO.:  [id number]          PATIENT TYPE:  OIB   LOCATION:  2899                         FACILITY:  [facility]   PHYSICIAN:  [name], M.D.   DATE OF BIRTH:  [birth year]   DATE OF PROCEDURE:  01/02/2004  DATE OF DISCHARGE:  01/02/2004                              CARDIAC CATHETERIZATION   INDICATIONS:  Mr. Sedore is a 70 year old gentleman with history of right  renal artery PTA and stenting in 1998.  He has hypertension, progressive  renal insufficiency and remote tobacco abuse.  He presents now for abdominal  aortography and bilateral renal angiography to rule out an ischemic etiology  of his progressive renal insufficiency.   PROCEDURE DESCRIPTION:  The patient was brought to the sixth floor [facility] in the postabsorptive state.  He was premedicated  with p.o. Valium.  His right groin was prepped and shaved in the usual  sterile fashion.  1% Xylocaine was used for local anesthesia.  A 6 French  sheath was inserted into the right femoral artery using standard Seldinger  technique. A 5 French tennis racquet catheter was used for midstream  abdominal aortography using Co2 and digital substraction.  A short 5 French  right Judkins catheter was used for selective right and left renal artery  angiography using approximately 5 mL of gadolinium in each kidney.  Retrograde aortic pressure was monitored during the case.   ANGIOGRAPHIC RESULTS:  Co2 angiography was performed using approximately 50  mL of Co2 revealing widely patent renal arteries.  Selective angiography  revealed patent renals bilaterally as well using gadolinium.   OVERALL IMPRESSION:  Widely patent renal arteries.  The patient has  intrinsic renal disease and progressive renal insufficiency.  He will be  hydrated and discharged home later today as an outpatient and will see me  back in approximately two weeks in followup.   Dr. Edd Arbour office was  notified of these results.       JB/MEDQ  D:  01/02/2004  T:  01/03/2004  Job:  [id number]   cc:   [facility]  [address]  [place], [place] [address]   Dyke Maes, M.D.  [address]  [place]  [place] [address]  Fax: [phone]

## 2010-08-29 NOTE — Op Note (Signed)
NAME:  Roger [name], Roger [name]                  ACCOUNT NO.:  [id number]   MEDICAL RECORD NO.:  [id number]          PATIENT TYPE:  OIB   LOCATION:  2899                         FACILITY:  [facility]   PHYSICIAN:  Quita Skye. Hart Rochester, M.D.  DATE OF BIRTH:  16-Jan-1941   DATE OF PROCEDURE:  02/20/2004  DATE OF DISCHARGE:                                 OPERATIVE REPORT   PREOPERATIVE DIAGNOSIS:  End-stage renal disease.   POSTOPERATIVE DIAGNOSIS:  End-stage renal disease.   OPERATION:  Creation of a left upper arm brachial artery to cephalic vein  arteriovenous fistula (Kauffman shunt).   SURGEON:  Quita Skye. Hart Rochester, M.D.   FIRST ASSISTANT:  Pecola Leisure, PA.   ANESTHESIA:  Local.   PROCEDURE:  The patient was taken to the operating room and placed in the  supine position, at which time the left upper extremity was prepped with  Betadine scrub and solution and draped in a routine sterile manner.  After  infiltration with 1% Xylocaine with epinephrine, a transverse incision was  made in the antecubital area, and the cubital vein dissected free.  The  cephalic vein was an excellent vein, being about 3.5 mm in size, and it was  dissected proximally and distally.  Using a side branch, it was dilated up  to the shoulder level and was felt to be adequate for a fistula.  Brachial  artery was exposed beneath the fascia and encircled with vessel loops.  It  had an excellent pulse.  Three thousand units of heparin were given  intravenously.  The artery was occluded proximally and distally with vessel  loops, opened with a 15 blade, and extended with the Potts scissors.  The  cephalic vein was then transected distally after ligating it, slightly  spatulated, and anastomosed end-to-side with 6-0 Prolene.  Following this,  the clamps were released, and there was an excellent pulse and thrill in the  vein, with good Doppler flow up to the shoulder level and good radial and  ulnar Doppler flow at the wrist with the  fistula open.  No protamine was  given.  The wound was irrigated with saline, closed in layers with Vicryl in  a subcuticular fashion.  Sterile dressing applied, and patient taken to the  recovery room in satisfactory condition.       JDL/MEDQ  D:  02/20/2004  T:  02/20/2004  Job:  [id number]

## 2010-08-29 NOTE — Procedures (Signed)
NAME:  Bartosik, Malike L                            ACCOUNT NO.:  [id number]   MEDICAL RECORD NO.:  [id number]                   PATIENT TYPE:  OUT   LOCATION:  [facility]                                 FACILITY:  [facility]   PHYSICIAN:  Edward L. Juanetta Gosling, M.D.             DATE OF BIRTH:  [birth year]   DATE OF PROCEDURE:  DATE OF DISCHARGE:                                    STRESS TEST   INDICATION:  Mr. Daft has a history of hypertension and of irregular heart  beat.  He is undergoing graded exercise testing to rule out ischemic cardiac  disease.  There are no contraindications to graded exercise testing.   FINDINGS:  Mr. Fitzsimmons exercised for 7 minutes, 5 seconds on Bruce protocol  with one acceleration of a stage, reaching and sustaining 10.1 mets.  His  maximum recorded heart rate was 136, which is 86% of his age predicted  maximal heart rate.  He had no chest discomfort with exercise, but did  complain of leg fatigue.  He was somewhat hypertensive at rest and had a  mildly hypertensive response to exercise.  There were no  electrocardiographic changes suggestive of inducible ischemia.   IMPRESSION:  1. Good exercise tolerance.  2. Leg fatigue and discomfort with exercise, but no chest pain.  3. No evidence of inducible ischemia.  4. Mild resting hypertension and hypertensive response to exercise.      ___________________________________________                                            Oneal Deputy. Juanetta Gosling, M.D.   ELH/MEDQ  D:  07/24/2003  T:  07/24/2003  Job:  [id number]   cc:   Angus G. Renard Matter, M.D.  [address]  [place]  [place] [address]  Fax: [phone]

## 2011-03-16 DIAGNOSIS — I219 Acute myocardial infarction, unspecified: Secondary | ICD-10-CM

## 2011-03-16 HISTORY — DX: Acute myocardial infarction, unspecified: I21.9

## 2011-04-20 HISTORY — PX: OTHER SURGICAL HISTORY: SHX169

## 2011-08-12 DIAGNOSIS — Z955 Presence of coronary angioplasty implant and graft: Secondary | ICD-10-CM

## 2011-08-21 ENCOUNTER — Emergency Department (HOSPITAL_COMMUNITY): Payer: Medicare Other

## 2011-08-21 ENCOUNTER — Encounter (HOSPITAL_COMMUNITY): Payer: Self-pay | Admitting: Emergency Medicine

## 2011-08-21 ENCOUNTER — Inpatient Hospital Stay (HOSPITAL_COMMUNITY)
Admission: EM | Admit: 2011-08-21 | Discharge: 2011-08-26 | DRG: 246 | Disposition: A | Discharge status: Home / Self Care | Payer: Medicare Other | Attending: Internal Medicine | Admitting: Internal Medicine

## 2011-08-21 DIAGNOSIS — M129 Arthropathy, unspecified: Secondary | ICD-10-CM | POA: Diagnosis present

## 2011-08-21 DIAGNOSIS — I498 Other specified cardiac arrhythmias: Secondary | ICD-10-CM | POA: Diagnosis present

## 2011-08-21 DIAGNOSIS — Z87891 Personal history of nicotine dependence: Secondary | ICD-10-CM

## 2011-08-21 DIAGNOSIS — R001 Bradycardia, unspecified: Secondary | ICD-10-CM | POA: Diagnosis not present

## 2011-08-21 DIAGNOSIS — I252 Old myocardial infarction: Secondary | ICD-10-CM

## 2011-08-21 DIAGNOSIS — I214 Non-ST elevation (NSTEMI) myocardial infarction: Principal | ICD-10-CM | POA: Diagnosis present

## 2011-08-21 DIAGNOSIS — D638 Anemia in other chronic diseases classified elsewhere: Secondary | ICD-10-CM | POA: Diagnosis present

## 2011-08-21 DIAGNOSIS — I10 Essential (primary) hypertension: Secondary | ICD-10-CM | POA: Diagnosis present

## 2011-08-21 DIAGNOSIS — T82855A Stenosis of coronary artery stent, initial encounter: Secondary | ICD-10-CM

## 2011-08-21 DIAGNOSIS — I739 Peripheral vascular disease, unspecified: Secondary | ICD-10-CM | POA: Diagnosis present

## 2011-08-21 DIAGNOSIS — E785 Hyperlipidemia, unspecified: Secondary | ICD-10-CM | POA: Diagnosis present

## 2011-08-21 DIAGNOSIS — Z992 Dependence on renal dialysis: Secondary | ICD-10-CM

## 2011-08-21 DIAGNOSIS — K219 Gastro-esophageal reflux disease without esophagitis: Secondary | ICD-10-CM | POA: Diagnosis present

## 2011-08-21 DIAGNOSIS — Y849 Medical procedure, unspecified as the cause of abnormal reaction of the patient, or of later complication, without mention of misadventure at the time of the procedure: Secondary | ICD-10-CM | POA: Diagnosis present

## 2011-08-21 DIAGNOSIS — Z9861 Coronary angioplasty status: Secondary | ICD-10-CM

## 2011-08-21 DIAGNOSIS — I2510 Atherosclerotic heart disease of native coronary artery without angina pectoris: Secondary | ICD-10-CM | POA: Diagnosis present

## 2011-08-21 DIAGNOSIS — I480 Paroxysmal atrial fibrillation: Secondary | ICD-10-CM | POA: Diagnosis present

## 2011-08-21 DIAGNOSIS — N2581 Secondary hyperparathyroidism of renal origin: Secondary | ICD-10-CM | POA: Diagnosis present

## 2011-08-21 DIAGNOSIS — M1990 Unspecified osteoarthritis, unspecified site: Secondary | ICD-10-CM | POA: Diagnosis present

## 2011-08-21 DIAGNOSIS — I4891 Unspecified atrial fibrillation: Secondary | ICD-10-CM | POA: Diagnosis present

## 2011-08-21 DIAGNOSIS — T82897A Other specified complication of cardiac prosthetic devices, implants and grafts, initial encounter: Secondary | ICD-10-CM | POA: Diagnosis present

## 2011-08-21 DIAGNOSIS — R1319 Other dysphagia: Secondary | ICD-10-CM

## 2011-08-21 DIAGNOSIS — N186 End stage renal disease: Secondary | ICD-10-CM | POA: Diagnosis present

## 2011-08-21 DIAGNOSIS — I120 Hypertensive chronic kidney disease with stage 5 chronic kidney disease or end stage renal disease: Secondary | ICD-10-CM | POA: Diagnosis present

## 2011-08-21 DIAGNOSIS — N189 Chronic kidney disease, unspecified: Secondary | ICD-10-CM | POA: Diagnosis present

## 2011-08-21 HISTORY — DX: Unspecified atrial fibrillation: I48.91

## 2011-08-21 LAB — BASIC METABOLIC PANEL
CO2: 26 mEq/L (ref 19–32)
Chloride: 91 mEq/L — ABNORMAL LOW (ref 96–112)
GFR calc Af Amer: 7 mL/min — ABNORMAL LOW (ref >90)
Potassium: 4 mEq/L (ref 3.5–5.1)
Sodium: 139 mEq/L (ref 135–145)

## 2011-08-21 LAB — MRSA PCR SCREENING: MRSA by PCR: NEGATIVE

## 2011-08-21 LAB — DIFFERENTIAL
Basophils Absolute: 0 10*3/uL (ref 0.0–0.1)
Lymphocytes Relative: 47 % — ABNORMAL HIGH (ref 12–46)
Monocytes Absolute: 0.9 10*3/uL (ref 0.1–1.0)
Neutro Abs: 2.8 10*3/uL (ref 1.7–7.7)
Neutrophils Relative %: 36 % — ABNORMAL LOW (ref 43–77)

## 2011-08-21 LAB — CARDIAC PANEL(CRET KIN+CKTOT+MB+TROPI)
CK, MB: 4.1 ng/mL — ABNORMAL HIGH (ref 0.3–4.0)
Total CK: 59 U/L (ref 7–232)
Troponin I: 21.24 ng/mL (ref <0.30)

## 2011-08-21 LAB — CBC
HCT: 44.9 % (ref 39.0–52.0)
Platelets: 211 10*3/uL (ref 150–400)
RDW: 14.6 % (ref 11.5–15.5)
WBC: 7.7 10*3/uL (ref 4.0–10.5)

## 2011-08-21 LAB — PROTIME-INR
INR: 1.02 (ref 0.00–1.49)
Prothrombin Time: 13.6 seconds (ref 11.6–15.2)

## 2011-08-21 LAB — APTT: aPTT: 29 seconds (ref 24–37)

## 2011-08-21 MED ORDER — CLOPIDOGREL BISULFATE 75 MG PO TABS
75.0000 mg | ORAL_TABLET | Freq: Every day | ORAL | Status: DC
  Administered 2011-08-21 – 2011-08-24 (×4): 75 mg via ORAL
  Filled 2011-08-21 (×5): qty 1

## 2011-08-21 MED ORDER — HEPARIN (PORCINE) IN NACL 100-0.45 UNIT/ML-% IJ SOLN
12.0000 [IU]/kg/h | INTRAMUSCULAR | Status: DC
  Administered 2011-08-21: 12 [IU]/kg/h via INTRAVENOUS
  Filled 2011-08-21 (×2): qty 250

## 2011-08-21 MED ORDER — FAMOTIDINE 10 MG PO TABS
10.0000 mg | ORAL_TABLET | Freq: Every day | ORAL | Status: DC
  Administered 2011-08-21 – 2011-08-22 (×2): 10 mg via ORAL
  Filled 2011-08-21 (×2): qty 1

## 2011-08-21 MED ORDER — DILTIAZEM HCL 100 MG IV SOLR
5.0000 mg/h | INTRAVENOUS | Status: DC
  Administered 2011-08-21: 5 mg/h via INTRAVENOUS
  Filled 2011-08-21: qty 100

## 2011-08-21 MED ORDER — DILTIAZEM HCL 50 MG/10ML IV SOLN
20.0000 mg | Freq: Once | INTRAVENOUS | Status: AC
  Administered 2011-08-21: 10 mg via INTRAVENOUS
  Filled 2011-08-21: qty 4

## 2011-08-21 MED ORDER — HEPARIN (PORCINE) IN NACL 100-0.45 UNIT/ML-% IJ SOLN
INTRAMUSCULAR | Status: AC
  Filled 2011-08-21: qty 250

## 2011-08-21 MED ORDER — SODIUM CHLORIDE 0.9 % IV BOLUS (SEPSIS)
1000.0000 mL | Freq: Once | INTRAVENOUS | Status: AC
  Administered 2011-08-21: 1000 mL via INTRAVENOUS

## 2011-08-21 MED ORDER — PANTOPRAZOLE SODIUM 40 MG PO TBEC
40.0000 mg | DELAYED_RELEASE_TABLET | Freq: Two times a day (BID) | ORAL | Status: DC
  Administered 2011-08-21 – 2011-08-26 (×11): 40 mg via ORAL
  Filled 2011-08-21 (×5): qty 1
  Filled 2011-08-21: qty 32
  Filled 2011-08-21 (×4): qty 1

## 2011-08-21 MED ORDER — NEPHRO-VITE 0.8 MG PO TABS
1.0000 | ORAL_TABLET | Freq: Every day | ORAL | Status: DC
  Administered 2011-08-21 – 2011-08-26 (×6): 1 via ORAL
  Filled 2011-08-21 (×7): qty 1

## 2011-08-21 MED ORDER — ALLOPURINOL 100 MG PO TABS
100.0000 mg | ORAL_TABLET | Freq: Two times a day (BID) | ORAL | Status: DC
  Administered 2011-08-21 – 2011-08-26 (×10): 100 mg via ORAL
  Filled 2011-08-21 (×12): qty 1

## 2011-08-21 MED ORDER — ASPIRIN 81 MG PO CHEW
324.0000 mg | CHEWABLE_TABLET | Freq: Once | ORAL | Status: AC
  Administered 2011-08-21: 324 mg via ORAL
  Filled 2011-08-21: qty 4

## 2011-08-21 MED ORDER — METOPROLOL TARTRATE 25 MG PO TABS
25.0000 mg | ORAL_TABLET | Freq: Two times a day (BID) | ORAL | Status: DC
  Administered 2011-08-22 – 2011-08-26 (×6): 25 mg via ORAL
  Filled 2011-08-21 (×10): qty 1

## 2011-08-21 MED ORDER — SODIUM CHLORIDE 0.9 % IV SOLN: INTRAVENOUS | Status: DC

## 2011-08-21 MED ORDER — NITROGLYCERIN 0.4 MG SL SUBL
0.4000 mg | SUBLINGUAL_TABLET | SUBLINGUAL | Status: DC | PRN
  Filled 2011-08-21: qty 25

## 2011-08-21 MED ORDER — SODIUM CHLORIDE 0.9 % IJ SOLN
INTRAMUSCULAR | Status: AC
  Filled 2011-08-21: qty 9

## 2011-08-21 MED ORDER — NITROGLYCERIN IN D5W 200-5 MCG/ML-% IV SOLN
2.0000 ug/min | INTRAVENOUS | Status: DC
  Administered 2011-08-21: 5 ug/min via INTRAVENOUS
  Filled 2011-08-21: qty 250

## 2011-08-21 MED ORDER — NITROGLYCERIN 0.4 MG SL SUBL: 0.4000 mg | SUBLINGUAL_TABLET | SUBLINGUAL | Status: DC | PRN

## 2011-08-21 MED ORDER — ISOSORBIDE MONONITRATE ER 30 MG PO TB24
30.0000 mg | ORAL_TABLET | Freq: Every day | ORAL | Status: DC
  Administered 2011-08-22 – 2011-08-26 (×5): 30 mg via ORAL
  Filled 2011-08-21 (×5): qty 1

## 2011-08-21 MED ORDER — ENALAPRIL MALEATE 10 MG PO TABS
10.0000 mg | ORAL_TABLET | Freq: Every day | ORAL | Status: DC
  Administered 2011-08-22 – 2011-08-25 (×5): 10 mg via ORAL
  Filled 2011-08-21 (×5): qty 1

## 2011-08-21 MED ORDER — ASPIRIN 325 MG PO TABS
325.0000 mg | ORAL_TABLET | Freq: Every day | ORAL | Status: DC
  Administered 2011-08-21 – 2011-08-23 (×3): 325 mg via ORAL
  Filled 2011-08-21 (×4): qty 1

## 2011-08-21 MED ORDER — ENOXAPARIN SODIUM 30 MG/0.3ML ~~LOC~~ SOLN
30.0000 mg | SUBCUTANEOUS | Status: DC
  Administered 2011-08-21: 30 mg via SUBCUTANEOUS
  Filled 2011-08-21: qty 0.3

## 2011-08-21 MED ORDER — ATORVASTATIN CALCIUM 40 MG PO TABS
40.0000 mg | ORAL_TABLET | Freq: Every day | ORAL | Status: DC
  Administered 2011-08-21 – 2011-08-26 (×6): 40 mg via ORAL
  Filled 2011-08-21 (×6): qty 1

## 2011-08-21 MED ORDER — HEPARIN BOLUS VIA INFUSION
4000.0000 [IU] | Freq: Once | INTRAVENOUS | Status: AC
  Administered 2011-08-21: 4000 [IU] via INTRAVENOUS
  Filled 2011-08-21: qty 4000

## 2011-08-21 MED ORDER — DILTIAZEM HCL 25 MG/5ML IV SOLN
INTRAVENOUS | Status: AC
  Filled 2011-08-21: qty 5

## 2011-08-21 MED ORDER — SEVELAMER CARBONATE 800 MG PO TABS
2400.0000 mg | ORAL_TABLET | Freq: Three times a day (TID) | ORAL | Status: DC
  Administered 2011-08-21 – 2011-08-26 (×13): 2400 mg via ORAL
  Filled 2011-08-21 (×18): qty 3

## 2011-08-21 NOTE — Progress Notes (Signed)
CRITICAL VALUE ALERT  Critical value received:  CKMB 61.9 and Trop 21.2  Date of notification:  08/21/11  Time of notification:  1725  Critical value read back: yes  Nurse who received alert:  yes  MD notified (1st page):  Brain Hagar  Time of first page:  1728  MD notified (2nd page):  Time of second page:  Responding MD: brian hagar  Time MD responded:  [phone]

## 2011-08-21 NOTE — H&P (Signed)
NAME:  Roger [name], Roger [name]                  ACCOUNT NO.:  [id number]  MEDICAL RECORD NO.:  [id number]  LOCATION:  IC07                          FACILITY:  [facility]  PHYSICIAN:  Kendallyn Lippold G. Renard Matter, MD   DATE OF BIRTH:  [birth year]  DATE OF ADMISSION:  08/21/2011 DATE OF DISCHARGE:  LH                             HISTORY & PHYSICAL   This patient had developed pain in substernal area this a.m.  Stated it was more of a feeling of pressure.  He presented to the emergency room where he was examined.  He was noted to have new-onset atrial fibrillation with rapid ventricular response.  He does have a prior history of MI and has 1 stent.  This was treated in December 2012.  The patient was started on a Cardizem drip in the emergency department and quickly returned to sinus rhythm with rapid ventricular response.  He was kept on Cardizem drip and moved to intensive care for further evaluation by Cardiology.  The patient was comfortable at the time of his last admission to the ICU.  An electrocardiogram showed sinus bradycardia with right bundle-branch block.  SOCIAL HISTORY:  The patient does not smoke or drink alcohol.  PAST MEDICAL HISTORY: 1. The patient has prior history of coronary artery disease with     previous myocardial infarction and stent placement. 2. Hypertension. 3. Arthritis. 4. Hyperlipidemia. 5. Kidney failure. 6. GERD 7. Atrial fibrillation.  SURGICAL PROCEDURES:  Ureteral stent placement, colonoscopy, esophagogastroduodenoscopy previous cardiac catheterization with stent placement.  ALLERGIES:  No known allergies.  REVIEW OF SYSTEMS:  HEENT:  Negative.  CARDIOPULMONARY:  The patient had palpitations and some shortness of breath and chest pressure today.  GI: No bowel irregularity or bleeding.  GU:  No dysuria or hematuria.  HOME MEDICATIONS: 1. Allopurinol 100 mg b.i.d. 2. Aspirin 325 mg daily. 3. Atorvastatin calcium 40 mg daily. 4. Clopidogrel 75 mg daily. 5. Enalapril  10 mg at bedtime. 6. Isosorbide 30 mg daily. 7. Metoprolol 25 mg b.i.d. 8. Rena-Vite 1 mg daily. 9. Nitroglycerin 0.4 mg p.r.n. 10.Pantoprazole sodium 40 mg daily.  PHYSICAL EXAMINATION:  VITAL SIGNS:  Alert male with blood pressure 100/60, pulse rate 56, respiration 18, temp 98. HEENT:  Eyes:  PERRLA.  TMs negative.  Oropharynx benign. NECK:  Supple.  No JVD or thyroid abnormalities. HEART:  Regular rhythm.  No cardiomegaly.  No murmurs. LUNGS:  Clear to P and A clear. ABDOMEN:  No palpable organs or masses.  No organomegaly. GENITALIA:  Normal. EXTREMITIES:  Free of edema. NEUROLOGIC:  Cranial nerves intact.  No motor or sensory deficit.  ASSESSMENT:  The patient has known coronary artery disease with previous myocardial infarction and stent placement, and presented with atrial fibrillation with rapid ventricular response and chest discomfort.  He does have a history of hypertension, hyperlipidemia, kidney failure, and gastroesophageal reflux disease.  PLAN:  The patient was monitored in ICU and seen in consultation by Dr. Alanda Amass who plans on transfer to [facility] for urgent cardiac catheterization.     Tyshun Tuckerman G. Renard Matter, MD     AGM/MEDQ  D:  08/21/2011  T:  08/21/2011  Job:  [id number]

## 2011-08-21 NOTE — ED Notes (Signed)
cardiazem stopped at this time, pt resting in bed,

## 2011-08-21 NOTE — Progress Notes (Signed)
Asked ED RN to turn off Cardizem gtt before pt arrives to floor. Patients HR upon arrival in 50's and NSR. BP 93/54.

## 2011-08-21 NOTE — ED Notes (Signed)
ntg held at this time, bp remains low. 87/53

## 2011-08-21 NOTE — ED Notes (Signed)
Pt reports getting up this morning and began having what he describes as "chest pressure" since 7am, sob at times, denies any n/v or cough, no fever. Is dizzy at times.  Pt placed on all monitors. ekg obtained and given to md

## 2011-08-21 NOTE — Progress Notes (Signed)
Brief Nutrition Note  Pt identified on nutrition risk report for dysphagia. Comments reports "he feels like he needs his esophagus stretched". Pt is currently on a renal diet. Good intake at lunch; PO: 100%. No nutrition interventions or recommendations at this time. Continue with current nutrition plan of care.  Melody Haver, RD, LDN Pager: [phone]

## 2011-08-21 NOTE — ED Notes (Signed)
Pt converted to sinus brady, md aware and repeat ekg obtained.

## 2011-08-21 NOTE — Progress Notes (Signed)
Report given to [facility] 

## 2011-08-21 NOTE — ED Notes (Signed)
Dr.king notified of pt's current vital signs and holding of ntg.

## 2011-08-21 NOTE — ED Notes (Signed)
Pt stated feeling much better, family at bedside, awaiting room assignment

## 2011-08-21 NOTE — Progress Notes (Signed)
UR Chart Review Completed  

## 2011-08-21 NOTE — ED Notes (Signed)
Pt c/o chest pressure since 7am with dizziness.

## 2011-08-21 NOTE — Consult Note (Signed)
Reason for Consult: [name] is being seen for new onset atrial fibrillation as a associated with substernal chest pain and ST segment depression on EKG prompting emergency admission to a [facility]H this morning.  Referring Physician: Patient is a medical patient of Dr. and his Megan Mans and followed cardiac-wise by Dr. Vena Rua. He was last seen by Dr. Gery Pray 05/06/2011 as an outpatient was doing well clinically. Roger [name] is an 71 y.o. male.    Chief Complaint: Patient was awakened this morning with substernal chest discomf  HPI: Patient developed substernal chest pressure this morning associated with palpitations prompting a visit to the emergency room. He was found to be in atrial fibrillation with rapid ventricular response an EKG showed diffuse inferolateral ST segment depression with incomplete right bundle branch block and left axis deviation he was started on IV Cardizem in the emergency room and admitted to the ICU for further care. His pain resolved spontaneously after nitroglycerin and in the emergency room on low-dose Cardizem drip he reverted to sinus rhythm. His Cardizem was DC'd in the ICU and EKG shows sinus bradycardia 53 per minute with incomplete RBBB RSR prime in V1 left axis deviation and resolution of ST segment depression with T wave inversion V1 V2. The patient was pain-free and essentially asymptomatic at this time.  Roger [name] is a 71 year old white distr in 1998 for hypertension and progressive renal insufficiency. He had 3 renal. 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 He discontinued smoking in 1994  He has chronic renal failure and has been on dialysis Tuesday Thursday and Saturday in [place] for left upper extremity AV fistula. He has been doing well  with that her blood pressures been under good control while he is taking he is medically medical his medications he has been compliant with this. He has a history of known coronary disease and presented to [facility] with acute posterior wall myocardial infarction in December of 2012. He was found to have a totally occluded circumflex artery and he went underwent stenting of this with a 3.0 her metal stent-15 mm successfully. He had associated 80-90% proximal LAD disease after the second diagonal and associated 80-90% stenosis of a moderate size second diagonal branch. The right coronary artery was dominant and had 40% mid and 50% PDA.  He had 60% bilateral common iliac stenosis that has not been symptomatic until lesion of his right renal artery on that study  It was elected to treat him medically after a culprit lesion bare-metal stenting of his circumflex. He had an outpatient Cardiolite to assess him for ischemia on 05/02/2010. No significant ischemia was demonstrated on nystatin the patient was well clinically and he has been followed up by Dr. Gery Pray [place] and fairly well with his dialysis  His major symptoms or exercise intolerance and fatigue is no overt heart failure no symptoms of arrhythmia no PND orthopnea and no significant chest pain up until this morning. As mentioned he had substernal chest pressure this morning it is prompt without radiation which is his prominent symptoms. He also had fluttering and palpitations when he went to the emergency room he had atrial fibrillation with rapid is no CVA with his cardiac enzymes were negative. He is comfortable in the ICU use now with a rate of 55 in sinus rhythm with EKG as  outlined.   He has a paced history of peripheral arterial disease and has had remote renal artery stent father of 3 with 4 grandchildren. 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  Past Medical History    Diagnosis Date  . HTN (hypertension)   . Arthritis   . Hypercholesteremia   . Kidney failure     secondary to HTN  . MI (myocardial infarction)   . GERD (gastroesophageal reflux disease)   . Atrial fibrillation     Past Surgical History  Procedure Date  . Ureteral stent placement   . Colonoscopy 04/2009    with EGD by Dr.Buccini moderately large hiatal hernia could contribute to his anemia,Schatzki ring which might accout for his intermittent dysphagia/+internal hemorrhoids ;family hx of colon ca  . Esophagogastroduodenoscopy 03/2010    by Dr.Magod/small hiatal hernia with widley patent fibrous ring,minimal bulbitis otherwise nl    History reviewed. No pertinent family history. Social History:  reports that he has never smoked. He does not have any smokeless tobacco history on file. He reports that he does not drink alcohol or use illicit drugs.  Allergies: No Known Allergies  Medications Prior to Admission  Medication Sig Dispense Refill  . allopurinol (ZYLOPRIM) 100 MG tablet Take 100 mg by mouth 2 (two) times daily.       [name] aspirin 325 MG tablet Take 325 mg by mouth daily.        [name] atorvastatin (LIPITOR) 40 MG tablet Take 40 mg by mouth daily.        . Cimetidine (TAGAMET PO) Take 1 tablet by mouth daily as needed. Acid Reflux      . clopidogrel (PLAVIX) 75 MG tablet Take 75 mg by mouth daily.        . enalapril (VASOTEC) 10 MG tablet Take 10 mg by mouth at bedtime.       . isosorbide mononitrate (IMDUR) 30 MG 24 hr tablet Take 30 mg by mouth daily.        . metoprolol tartrate (LOPRESSOR) 25 MG tablet Take 25 mg by mouth 2 (two) times daily.      . multivitamin (RENA-VIT) TABS tablet Take 1 tablet by mouth daily.      . nitroGLYCERIN (NITROSTAT) 0.4 MG SL tablet Place 0.4 mg under the tongue every 5 (five) minutes as needed.        . pantoprazole (PROTONIX) 40 MG tablet Take 40 mg by mouth 2 (two) times daily.       . sevelamer (RENVELA) 800 MG tablet Take 2,400 mg by mouth  3 (three) times daily with meals.        Results for orders placed during the hospital encounter of 08/21/11 (from the past 48 hour(s))  CBC     Status: Abnormal   Collection Time   08/21/11  8:13 AM      Component Value Range Comment   WBC 7.7  4.0 - 10.5 (K/uL)    RBC 4.07 (*) 4.22 - 5.81 (MIL/uL)    Hemoglobin 14.7  13.0 - 17.0 (g/dL)    HCT 16.1  09.6 - 04.5 (%)    MCV 110.3 (*) 78.0 - 100.0 (fL)    MCH 36.1 (*) 26.0 - 34.0 (pg)    MCHC 32.7  30.0 - 36.0 (g/dL)    RDW 40.9  81.1 - 91.4 (%)    Platelets 211  150 - 400 (K/uL)   DIFFERENTIAL  Status: Abnormal   Collection Time   08/21/11  8:13 AM      Component Value Range Comment   Neutrophils Relative 36 (*) 43 - 77 (%)    Neutro Abs 2.8  1.7 - 7.7 (K/uL)    Lymphocytes Relative 47 (*) 12 - 46 (%)    Lymphs Abs 3.6  0.7 - 4.0 (K/uL)    Monocytes Relative 11  3 - 12 (%)    Monocytes Absolute 0.9  0.1 - 1.0 (K/uL)    Eosinophils Relative 5  0 - 5 (%)    Eosinophils Absolute 0.4  0.0 - 0.7 (K/uL)    Basophils Relative 0  0 - 1 (%)    Basophils Absolute 0.0  0.0 - 0.1 (K/uL)   BASIC METABOLIC PANEL     Status: Abnormal   Collection Time   08/21/11  8:13 AM      Component Value Range Comment   Sodium 139  135 - 145 (mEq/L)    Potassium 4.0  3.5 - 5.1 (mEq/L)    Chloride 91 (*) 96 - 112 (mEq/L)    CO2 26  19 - 32 (mEq/L)    Glucose, Bld 129 (*) 70 - 99 (mg/dL)    BUN 35 (*) 6 - 23 (mg/dL)    Creatinine, Ser 4.78 (*) 0.50 - 1.35 (mg/dL)    Calcium 29.5 (*) 8.4 - 10.5 (mg/dL)    GFR calc non Af Amer 6 (*) >90 (mL/min)    GFR calc Af Amer 7 (*) >90 (mL/min)   APTT     Status: Normal   Collection Time   08/21/11  8:13 AM      Component Value Range Comment   aPTT 29  24 - 37 (seconds)   PROTIME-INR     Status: Normal   Collection Time   08/21/11  8:13 AM      Component Value Range Comment   Prothrombin Time 13.6  11.6 - 15.2 (seconds)    INR 1.02  0.00 - 1.49    CARDIAC PANEL(CRET KIN+CKTOT+MB+TROPI)     Status: Abnormal    Collection Time   08/21/11  8:13 AM      Component Value Range Comment   Total CK 59  7 - 232 (U/L)    CK, MB 4.1 (*) 0.3 - 4.0 (ng/mL)    Troponin I <0.30  <0.30 (ng/mL)    Relative Index RELATIVE INDEX IS INVALID  0.0 - 2.5     Dg Chest 2 View  08/21/2011  *RADIOLOGY REPORT*  Clinical Data: Onset of chest pain at 0700 this morning, dizziness, history hypertension, myocardial infarction, renal failure  CHEST - 2 VIEW  Comparison: 03/15/2010  Findings: Upper-normal size of cardiac silhouette. Calcified tortuous aorta. Pulmonary vascularity normal. Lungs clear. No pleural effusion or pneumothorax. No acute osseous findings.  IMPRESSION: No acute abnormalities.  Original Report Authenticated By: Lollie Marrow, M.D.    ROS: the remainder of refer systems unremarkable other than outlined above. Blood pressure 111/59, pulse 52, temperature 97.9 F (36.6 C), temperature source Oral, resp. rate 19, height 5\' 7"  (1.702 m), weight 76.6 kg (168 lb 14 oz), SpO2 100.00%. PE:  on exam he is euthyroid appearing blood pressure is 130/70 heart rate is 55-56 and regular he is comfortable and there is no overt CHF or no significant carotid bruits thyroid is not enlarged and his neck is supple chest is clear to P&A with no wheezes rubs or rhonchi. The PMI is in  the left fifth ICS just outside the [facility] is increased splitting of second heart sound there is a honking musical systolic ejection murmur 2/6 at the left and right mid sternal borders. There is minimal delay in his carotid upstroke there is no AI examination murmur in the second heart sound.  Is of note the patient has mild aortic valvular stenosis that was noted on a prior 2-D echo 1/12 and has not been followed up since that he has been asymptomatic with this. His murmur sounds like a mild to moderate aortic valvular stenosis at present. Liver spleen and kidney are not palpable abdomen nontender femorals intact +1 bilaterally. DP and PT are palpable but  diminished bilaterally there is no edema and no lower extremity ulcerations. The patient has a function AV fistula in the left upper arm area with an excellent a continuous bruit. There are no pathologic reflexes he is oriented x3 fairly comfortable at present and cooperative.  Roger [name] had chest pain with associated new onset AF this morning prompting his admission he is stable at present. He has residual high grade LAD and diagonal disease. He's had recent revascularization and acute PMI of the circumflex artery in December. Followup outpatient Cardiolite showed no ischemia  ST segment depression status morning I think his atrial fibrillation was probably ischemic in nature I believe he should have recatheterization at this assess his coronary disease. He would probably be a candidate for LAD and possible diagonal intervention aAsses he is not on Coumadin therapy at present he had been on Plavix which is not restarted and I will restart this now and we will also cover him with heparin. He does not require cortisone now since he's converted to sinus rhythm. I would recommend transfer to [facility] for consideration for recatheterization and probable coronary intervention. This has been discussed with the patient and we will try to arrange transfer. His wife is not in attendance at present. Discussed this with the nurses as well try to institute orders. Alanda Amass, Marsi Turvey A 08/21/2011, 12:26 PM

## 2011-08-21 NOTE — ED Notes (Signed)
Pt stated feeling a little better--pain 1/10

## 2011-08-21 NOTE — Progress Notes (Signed)
ANTICOAGULATION CONSULT NOTE - Initial Consult  Pharmacy Consult for Heparin Indication: chest pain/ACS  No Known Allergies  Patient Measurements: Height: 5\' 7"  (170.2 cm) Weight: 168 lb 14 oz (76.6 kg) IBW/kg (Calculated) : 66.1  Heparin Dosing Weight: 76.6kg  Vital Signs: Temp: 98.2 F (36.8 C) (05/10 1600) Temp src: Oral (05/10 1600) BP: 100/52 mmHg (05/10 1830) Pulse Rate: 58  (05/10 1830)  Labs:  Basename 08/21/11 1600 08/21/11 0813  HGB -- 14.7  HCT -- 44.9  PLT -- 211  APTT -- 29  LABPROT -- 13.6  INR -- 1.02  HEPARINUNFRC -- --  CREATININE -- 8.00*  CKTOTAL 535* 59  CKMB 61.9* 4.1*  TROPONINI 21.24* <0.30    Estimated Creatinine Clearance: 8.1 ml/min (by C-G formula based on Cr of 8).   Medical History: Past Medical History  Diagnosis Date  . HTN (hypertension)   . Arthritis   . Hypercholesteremia   . Kidney failure     secondary to HTN  . MI (myocardial infarction)   . GERD (gastroesophageal reflux disease)   . Atrial fibrillation     Medications:  Scheduled:    . allopurinol  100 mg Oral BID  . aspirin  324 mg Oral Once  . aspirin  325 mg Oral Daily  . atorvastatin  40 mg Oral Daily  . b complex-vitamin c-folic acid  1 tablet Oral Daily  . clopidogrel  75 mg Oral Q breakfast  . diltiazem  20 mg Intravenous Once  . enalapril  10 mg Oral QHS  . famotidine  10 mg Oral Daily  . heparin      . heparin  4,000 Units Intravenous Once  . isosorbide mononitrate  30 mg Oral Daily  . metoprolol tartrate  25 mg Oral BID  . pantoprazole  40 mg Oral BID  . sevelamer  2,400 mg Oral TID WC  . sodium chloride  1,000 mL Intravenous Once  . sodium chloride      . DISCONTD: sodium chloride   Intravenous STAT  . DISCONTD: enoxaparin (LOVENOX) injection  30 mg Subcutaneous Q24H    Assessment: 71 yo M with ESRD and documented CAD presents with chest pain and new onset Afib.  Cardiac enzymes are positive. No bleeding noted.  Plan transfer to [facility] for  cardiac cath.   Goal of Therapy: .  Heparin level 0.3-0.7 Monitor platelets by anticoagulation protocol: Yes   Plan:  1) heparin 4000 units IV bolus x1 2) heparin infusion at 12 units/kg/hr 3) Check 8hr heparin level then daily while on heparin 4) Daily CBC to monitor Hg & Pltc  Elson Clan, PHARMD 08/21/2011,7:08 PM

## 2011-08-21 NOTE — ED Provider Notes (Signed)
This chart was scribed for Roger Bailiff, MD by Wallis Mart. The patient was seen in room APA10/APA10 and the patient's care was started at 8:14 AM.    CSN: [id number]  Arrival date & time 08/21/11  0753   First MD Initiated Contact with Patient 08/21/11 [phone]      Chief Complaint  Patient presents with  . Chest Pain    (Consider location/radiation/quality/duration/timing/severity/associated sxs/prior treatment) HPI Pt c/o of pressure in chest onset 7 this morning while getting out of bed. Pt denies SOB. The pain goes all the way across his chest but does not radiate anywhere. Nothing improves or worsens the pain.  Pt also c/o associated dizziness and states that at one point it felt like his heart was fluttering. Pt took NG w/ no relief.  Denies taking any medications today. Pt states that he had an MI 2 years ago and experienced similar sx.  Pt is on dialysis, last tx was yesterday.  There are no other associated symptoms and no other alleviating or aggravating factors.  Past Medical History  Diagnosis Date  . HTN (hypertension)   . Arthritis   . Hypercholesteremia   . Kidney failure     secondary to HTN  . MI (myocardial infarction)   . GERD (gastroesophageal reflux disease)   . Atrial fibrillation     Past Surgical History  Procedure Date  . Ureteral stent placement   . Colonoscopy 04/2009    with EGD by Dr.Buccini moderately large hiatal hernia could contribute to his anemia,Schatzki ring which might accout for his intermittent dysphagia/+internal hemorrhoids ;family hx of colon ca  . Esophagogastroduodenoscopy 03/2010    by Dr.Magod/small hiatal hernia with widley patent fibrous ring,minimal bulbitis otherwise nl    History reviewed. No pertinent family history.  History  Substance Use Topics  . Smoking status: Never Smoker   . Smokeless tobacco: Not on file  . Alcohol Use: No      Review of Systems  Constitutional: Negative for fever, activity change,  appetite change and fatigue.  HENT: Negative for congestion, sore throat, rhinorrhea, neck pain and neck stiffness.   Respiratory: Positive for shortness of breath. Negative for cough.   Cardiovascular: Positive for chest pain and palpitations.  Gastrointestinal: Negative for nausea, vomiting and abdominal pain.  Genitourinary: Negative for dysuria, urgency, frequency and flank pain.  Musculoskeletal: Negative for myalgias, back pain and arthralgias.  Neurological: Positive for dizziness. Negative for weakness, light-headedness, numbness and headaches.  All other systems reviewed and are negative.   10 Systems reviewed and all are negative for acute change except as noted in the HPI.   Allergies  Review of patient's allergies indicates no known allergies.  Home Medications   Current Outpatient Rx  Name Route Sig Dispense Refill  . ALLOPURINOL 100 MG PO TABS Oral Take 100 mg by mouth 2 (two) times daily.     . ASPIRIN 325 MG PO TABS Oral Take 325 mg by mouth daily.      . ATORVASTATIN CALCIUM 40 MG PO TABS Oral Take 40 mg by mouth daily.      [name] TAGAMET PO Oral Take 1 tablet by mouth daily as needed. Acid Reflux    . CLOPIDOGREL BISULFATE 75 MG PO TABS Oral Take 75 mg by mouth daily.      . ENALAPRIL MALEATE 10 MG PO TABS Oral Take 10 mg by mouth at bedtime.     . ISOSORBIDE MONONITRATE ER 30 MG PO TB24 Oral Take 30 mg  by mouth daily.      [name] METOPROLOL TARTRATE 25 MG PO TABS Oral Take 25 mg by mouth 2 (two) times daily.    [name] RENA-VITE PO TABS Oral Take 1 tablet by mouth daily.    [name] NITROGLYCERIN 0.4 MG SL SUBL Sublingual Place 0.4 mg under the tongue every 5 (five) minutes as needed.      [name] PANTOPRAZOLE SODIUM 40 MG PO TBEC Oral Take 40 mg by mouth 2 (two) times daily.     [name] SEVELAMER CARBONATE 800 MG PO TABS Oral Take 2,400 mg by mouth 3 (three) times daily with meals.      BP 96/68  Pulse 55  Temp(Src) 97.5 F (36.4 C) (Oral)  Resp 18  Ht 5\' 6"  (1.676 m)  Wt 167 lb (75.751  kg)  BMI 26.95 kg/m2  SpO2 98%  Physical Exam  Nursing note and vitals reviewed. Constitutional: He is oriented to person, place, and time. He appears well-developed and well-nourished. No distress.  HENT:  Head: Normocephalic and atraumatic.  Mouth/Throat: Oropharynx is clear and moist.  Eyes: Conjunctivae and EOM are normal. Pupils are equal, round, and reactive to light.  Neck: Normal range of motion. Neck supple. No tracheal deviation present.  Cardiovascular: Normal rate, normal heart sounds and intact distal pulses.  Exam reveals no gallop and no friction rub.   No murmur heard.      Tachycardic and irreg irreg rhythm  Pulmonary/Chest: Effort normal. No respiratory distress.       CP is not reproducible  Abdominal: Soft. He exhibits no distension. There is no tenderness.  Musculoskeletal: Normal range of motion. He exhibits no edema and no tenderness.       No lower extremity edema  Neurological: He is alert and oriented to person, place, and time. No sensory deficit.  Skin: Skin is warm and dry.  Psychiatric: He has a normal mood and affect. His behavior is normal.    ED Course  Procedures (including critical care time)   Date: 08/21/2011  Rate: 145  Rhythm: atrial fibrillation  QRS Axis: right axis deviation  Intervals: normal  ST/T Wave abnormalities: ST depressions diffusely  Conduction Disutrbances:left anterior fascicular block  Narrative Interpretation:   Old EKG Reviewed: changes noted   Date: 08/21/2011  Rate: 53  Rhythm: sinus bradycardia  QRS Axis: right  Intervals: QT prolonged  ST/T Wave abnormalities: ST depressions inferiorly and ST depressions laterally  Conduction Disutrbances:none  Narrative Interpretation:   Old EKG Reviewed: changes noted   CRITICAL CARE Performed by: Roger [name]   Total critical care time: 40 min  Critical care time was exclusive of separately billable procedures and treating other patients.  Critical care was  necessary to treat or prevent imminent or life-threatening deterioration.  Critical care was time spent personally by me on the following activities: development of treatment plan with patient and/or surrogate as well as nursing, discussions with consultants, evaluation of patient's response to treatment, examination of patient, obtaining history from patient or surrogate, ordering and performing treatments and interventions, ordering and review of laboratory studies, ordering and review of radiographic studies, pulse oximetry and re-evaluation of patient's condition.  DIAGNOSTIC STUDIES: Oxygen Saturation is 99% on room air, normal by my interpretation.    COORDINATION OF CARE:  9:14 AM: EDP at pt's bedside. All results reviewed and discussed with pt, questions answered, pt agreeable with plan. Pt to be admitted.    Labs Reviewed  CBC - Abnormal; Notable for the following:  RBC 4.07 (*)    MCV 110.3 (*)    MCH 36.1 (*)    All other components within normal limits  DIFFERENTIAL - Abnormal; Notable for the following:    Neutrophils Relative 36 (*)    Lymphocytes Relative 47 (*)    All other components within normal limits  BASIC METABOLIC PANEL - Abnormal; Notable for the following:    Chloride 91 (*)    Glucose, Bld 129 (*)    BUN 35 (*)    Creatinine, Ser 8.00 (*)    Calcium 11.7 (*)    GFR calc non Af Amer 6 (*)    GFR calc Af Amer 7 (*)    All other components within normal limits  CARDIAC PANEL(CRET KIN+CKTOT+MB+TROPI) - Abnormal; Notable for the following:    CK, MB 4.1 (*)    All other components within normal limits  APTT  PROTIME-INR   Dg Chest 2 View  08/21/2011  *RADIOLOGY REPORT*  Clinical Data: Onset of chest pain at 0700 this morning, dizziness, history hypertension, myocardial infarction, renal failure  CHEST - 2 VIEW  Comparison: 03/15/2010  Findings: Upper-normal size of cardiac silhouette. Calcified tortuous aorta. Pulmonary vascularity normal. Lungs clear. No  pleural effusion or pneumothorax. No acute osseous findings.  IMPRESSION: No acute abnormalities.  Original Report Authenticated By: Lollie Marrow, M.D.     1. Atrial fibrillation with RVR   2. Chest pain       MDM  Chest pain likely secondary to afib with rapid ventricular response. Improvement of his symptoms after conversion on diltiazem drip following a bolus. Did not receive nitroglycerin as he became pain-free. Received aspirin. Initial cardiac markers are negative. Patient is a dialysis patient. Next data dialysis tomorrow. Until the patient warrants admission to the hospital for serial cardiac markers. Discussed the case with Dr. Megan Mans his primary care physician who accepted the patient for admission for rule out MI. He'll remain on the Cardizem drip. Additional etiologies chest pain considered such as pulmonary embolus. I do not feel this is the case secondary to resolution of pain with rate control.  I personally performed the services described in this documentation, which was scribed in my presence. The recorded information has been reviewed and considered.          Roger Bailiff, MD 08/21/11 [phone]

## 2011-08-22 DIAGNOSIS — E785 Hyperlipidemia, unspecified: Secondary | ICD-10-CM | POA: Diagnosis present

## 2011-08-22 DIAGNOSIS — I480 Paroxysmal atrial fibrillation: Secondary | ICD-10-CM | POA: Diagnosis present

## 2011-08-22 DIAGNOSIS — I214 Non-ST elevation (NSTEMI) myocardial infarction: Secondary | ICD-10-CM | POA: Diagnosis present

## 2011-08-22 DIAGNOSIS — I739 Peripheral vascular disease, unspecified: Secondary | ICD-10-CM | POA: Diagnosis present

## 2011-08-22 DIAGNOSIS — N189 Chronic kidney disease, unspecified: Secondary | ICD-10-CM | POA: Diagnosis present

## 2011-08-22 DIAGNOSIS — I10 Essential (primary) hypertension: Secondary | ICD-10-CM | POA: Diagnosis present

## 2011-08-22 DIAGNOSIS — M1990 Unspecified osteoarthritis, unspecified site: Secondary | ICD-10-CM | POA: Diagnosis present

## 2011-08-22 DIAGNOSIS — R001 Bradycardia, unspecified: Secondary | ICD-10-CM | POA: Diagnosis not present

## 2011-08-22 LAB — CBC
HCT: 35.4 % — ABNORMAL LOW (ref 39.0–52.0)
Hemoglobin: 11.9 g/dL — ABNORMAL LOW (ref 13.0–17.0)
MCH: 36.1 pg — ABNORMAL HIGH (ref 26.0–34.0)
MCHC: 33.6 g/dL (ref 30.0–36.0)
MCV: 107.3 fL — ABNORMAL HIGH (ref 78.0–100.0)
Platelets: 158 K/uL (ref 150–400)
RBC: 3.3 MIL/uL — ABNORMAL LOW (ref 4.22–5.81)
RDW: 14.2 % (ref 11.5–15.5)
WBC: 6 K/uL (ref 4.0–10.5)

## 2011-08-22 LAB — BASIC METABOLIC PANEL
CO2: 21 mEq/L (ref 19–32)
Calcium: 9.4 mg/dL (ref 8.4–10.5)
Chloride: 93 mEq/L — ABNORMAL LOW (ref 96–112)
Glucose, Bld: 110 mg/dL — ABNORMAL HIGH (ref 70–99)
Potassium: 4.8 mEq/L (ref 3.5–5.1)
Sodium: 136 mEq/L (ref 135–145)

## 2011-08-22 LAB — CARDIAC PANEL(CRET KIN+CKTOT+MB+TROPI)
CK, MB: 26.5 ng/mL (ref 0.3–4.0)
Troponin I: 16.56 ng/mL (ref <0.30)

## 2011-08-22 LAB — HEPARIN LEVEL (UNFRACTIONATED): Heparin Unfractionated: 0.56 IU/mL (ref 0.30–0.70)

## 2011-08-22 MED ORDER — ALUMINUM HYDROXIDE GEL 320 MG/5ML PO SUSP
30.0000 mL | Freq: Four times a day (QID) | ORAL | Status: DC | PRN
  Administered 2011-08-23: 30 mL via ORAL
  Filled 2011-08-22 (×2): qty 30

## 2011-08-22 NOTE — Progress Notes (Signed)
ANTICOAGULATION CONSULT NOTE - Follow Up Consult  Pharmacy Consult for Heparin Indication: chest pain/ACS  No Known Allergies  Patient Measurements: Height: 5\' 7"  (170.2 cm) Weight: 168 lb 14 oz (76.6 kg) IBW/kg (Calculated) : 66.1  Heparin Dosing Weight: 76.6kg   Vital Signs: Temp: 99.3 F (37.4 C) (05/11 0800) Temp src: Oral (05/11 0400) BP: 138/59 mmHg (05/11 1030) Pulse Rate: 58  (05/11 1030)  Labs:  Basename 08/22/11 0956 08/22/11 0331 08/21/11 1600 08/21/11 0813  HGB 11.9* -- -- 14.7  HCT 35.4* -- -- 44.9  PLT 158 -- -- 211  APTT -- -- -- 29  LABPROT -- -- -- 13.6  INR -- -- -- 1.02  HEPARINUNFRC 0.43 0.56 -- --  CREATININE -- -- -- 8.00*  CKTOTAL -- -- 535* 59  CKMB -- -- 61.9* 4.1*  TROPONINI -- -- 21.24* <0.30    Estimated Creatinine Clearance: 8.1 ml/min (by C-G formula based on Cr of 8).   Medications:  Scheduled:    . allopurinol  100 mg Oral BID  . aspirin  325 mg Oral Daily  . atorvastatin  40 mg Oral Daily  . b complex-vitamin c-folic acid  1 tablet Oral Daily  . clopidogrel  75 mg Oral Q breakfast  . enalapril  10 mg Oral QHS  . famotidine  10 mg Oral Daily  . heparin      . heparin  4,000 Units Intravenous Once  . isosorbide mononitrate  30 mg Oral Daily  . metoprolol tartrate  25 mg Oral BID  . pantoprazole  40 mg Oral BID  . sevelamer  2,400 mg Oral TID WC  . sodium chloride      . DISCONTD: enoxaparin (LOVENOX) injection  30 mg Subcutaneous Q24H   Infusions:    . sodium chloride 10 mL/hr at 08/22/11 1100  . heparin 11.749 Units/kg/hr (08/22/11 0700)  . nitroGLYCERIN 5 mcg/min (08/22/11 0900)  . DISCONTD: diltiazem (CARDIZEM) infusion Stopped (08/21/11 1100)    Assessment: 71 yo M with ESRD and documented CAD presents with chest pain and new onset Afib. Cardiac enzymes are positive. No bleeding noted. Plan transfer to [facility] for cardiac cath. Pharmacy dosing heparin. Heparin level this AM is therapeutic @ 0.43. No issues per RN.  Heparin running @ 900units/hr   Goal of Therapy:  Heparin level 0.3-0.7 units/ml Monitor platelets by anticoagulation protocol: Yes   Plan:  1) Continue Heparin gtt @ 900 units/hr (20ml/hr) 2) F/u AM Heparin level and CBC  Janace Litten, PharmD [phone] 08/22/2011,11:16 AM

## 2011-08-22 NOTE — Progress Notes (Signed)
Report given to [place], Charity fundraiser.

## 2011-08-22 NOTE — Progress Notes (Signed)
ANTICOAGULATION CONSULT NOTE - Follow Up Consult  Pharmacy Consult for heparin Indication: chest pain/ACS  Labs:  Basename 08/22/11 0331 08/21/11 1600 08/21/11 0813  HGB -- -- 14.7  HCT -- -- 44.9  PLT -- -- 211  APTT -- -- 29  LABPROT -- -- 13.6  INR -- -- 1.02  HEPARINUNFRC 0.56 -- --  CREATININE -- -- 8.00*  CKTOTAL -- 535* 59  CKMB -- 61.9* 4.1*  TROPONINI -- 21.24* <0.30    Assessment/Plan: 71yo male therapeutic on heparin with initial dosing for CP.  Will continue at current rate and confirm stable with additional level.  Colleen Can PharmD BCPS 08/22/2011,7:45 AM

## 2011-08-22 NOTE — Consult Note (Signed)
I have seen and examined this patient and agree with the assessment/plan as outlined above by [facility]. Plan for Hd later today or tomorrow depending on the staffing situation- reassuring that there is no acute HD needs at present. Glessie Eustice K.,MD 08/22/2011 1:10 PM

## 2011-08-22 NOTE — Progress Notes (Signed)
  Echocardiogram 2D Echocardiogram has been performed.  Roger [name] 08/22/2011, 4:08 PM

## 2011-08-22 NOTE — Consult Note (Signed)
[facility] Renal Consultation Note  Indication for Consultation:  Management of ESRD/hemodialysis; anemia, hypertension/volume and secondary hyperparathyroidism  HPI: Roger [name] is a 71 y.o. male admitted after  Transfer from [facility] yesterday with NSTEMI  With ho CAD/Atrial Fibrillation  RVR by [facility].  Now on 2900 , no chest pain or sob currently. Last HD was Thursday 08/20/11 without problems. Awoken with chest discomfort Friday am no relief wth subling. NTG then went to hospital. Converted to sinus with IV Cardizem now.     Past Medical History  Diagnosis Date  . HTN (hypertension)   . Arthritis   . Hypercholesteremia   . Kidney failure     secondary to HTN  . MI (myocardial infarction)   . GERD (gastroesophageal reflux disease)   . Atrial fibrillation   Anemia of Chronic Disease GI Bleed = 04/16/2009 Internal Hemorrhoids/Hiatal Hernia with Schatzk's Ring   [facility]                     12./30/2011 Small Bowel Capsule Study+ Small Bowell erosions vs AVM  Past Surgical History  Procedure Date  . Ureteral stent placement   . Colonoscopy 04/2009    with EGD by Dr.Buccini moderately large hiatal hernia could contribute to his anemia,Schatzki ring which might accout for his intermittent dysphagia/+internal hemorrhoids ;family hx of colon ca  . Esophagogastroduodenoscopy 03/2010    by Dr.Magod/small hiatal hernia with widley patent fibrous ring,minimal bulbitis otherwise nl     History reviewed. No pertinent family history. Social History= lives with Wife in [place], quit smoking 1994 after 36 years smoking.    No Known Allergies  Prior to Admission medications   Medication Sig Start Date End Date Taking? Authorizing Provider  allopurinol (ZYLOPRIM) 100 MG tablet Take 100 mg by mouth 2 (two) times daily.    Yes Historical Provider, MD  aspirin 325 MG tablet Take 325 mg by mouth daily.     Yes Historical Provider, MD  atorvastatin (LIPITOR)  40 MG tablet Take 40 mg by mouth daily.     Yes Historical Provider, MD  Cimetidine (TAGAMET PO) Take 1 tablet by mouth daily as needed. Acid Reflux   Yes Historical Provider, MD  clopidogrel (PLAVIX) 75 MG tablet Take 75 mg by mouth daily.     Yes Historical Provider, MD  enalapril (VASOTEC) 10 MG tablet Take 10 mg by mouth at bedtime.    Yes Historical Provider, MD  isosorbide mononitrate (IMDUR) 30 MG 24 hr tablet Take 30 mg by mouth daily.     Yes Historical Provider, MD  metoprolol tartrate (LOPRESSOR) 25 MG tablet Take 25 mg by mouth 2 (two) times daily.   Yes Historical Provider, MD  multivitamin (RENA-VIT) TABS tablet Take 1 tablet by mouth daily.   Yes Historical Provider, MD  nitroGLYCERIN (NITROSTAT) 0.4 MG SL tablet Place 0.4 mg under the tongue every 5 (five) minutes as needed.     Yes Historical Provider, MD  pantoprazole (PROTONIX) 40 MG tablet Take 40 mg by mouth 2 (two) times daily.    Yes Historical Provider, MD  sevelamer (RENVELA) 800 MG tablet Take 2,400 mg by mouth 3 (three) times daily with meals.   Yes Historical Provider, MD     Anti-infectives    None      Results for orders placed during the hospital encounter of 08/21/11 (from the past 48 hour(s))  POCT I-STAT TROPONIN I     Status: Normal  Collection Time   08/21/11  8:08 AM      Component Value Range Comment   Troponin i, poc 0.04  0.00 - 0.08 (ng/mL)    Comment 3            CBC     Status: Abnormal   Collection Time   08/21/11  8:13 AM      Component Value Range Comment   WBC 7.7  4.0 - 10.5 (K/uL)    RBC 4.07 (*) 4.22 - 5.81 (MIL/uL)    Hemoglobin 14.7  13.0 - 17.0 (g/dL)    HCT 21.3  08.6 - 57.8 (%)    MCV 110.3 (*) 78.0 - 100.0 (fL)    MCH 36.1 (*) 26.0 - 34.0 (pg)    MCHC 32.7  30.0 - 36.0 (g/dL)    RDW 46.9  62.9 - 52.8 (%)    Platelets 211  150 - 400 (K/uL)   DIFFERENTIAL     Status: Abnormal   Collection Time   08/21/11  8:13 AM      Component Value Range Comment   Neutrophils Relative 36  (*) 43 - 77 (%)    Neutro Abs 2.8  1.7 - 7.7 (K/uL)    Lymphocytes Relative 47 (*) 12 - 46 (%)    Lymphs Abs 3.6  0.7 - 4.0 (K/uL)    Monocytes Relative 11  3 - 12 (%)    Monocytes Absolute 0.9  0.1 - 1.0 (K/uL)    Eosinophils Relative 5  0 - 5 (%)    Eosinophils Absolute 0.4  0.0 - 0.7 (K/uL)    Basophils Relative 0  0 - 1 (%)    Basophils Absolute 0.0  0.0 - 0.1 (K/uL)   BASIC METABOLIC PANEL     Status: Abnormal   Collection Time   08/21/11  8:13 AM      Component Value Range Comment   Sodium 139  135 - 145 (mEq/L)    Potassium 4.0  3.5 - 5.1 (mEq/L)    Chloride 91 (*) 96 - 112 (mEq/L)    CO2 26  19 - 32 (mEq/L)    Glucose, Bld 129 (*) 70 - 99 (mg/dL)    BUN 35 (*) 6 - 23 (mg/dL)    Creatinine, Ser 4.13 (*) 0.50 - 1.35 (mg/dL)    Calcium 24.4 (*) 8.4 - 10.5 (mg/dL)    GFR calc non Af Amer 6 (*) >90 (mL/min)    GFR calc Af Amer 7 (*) >90 (mL/min)   APTT     Status: Normal   Collection Time   08/21/11  8:13 AM      Component Value Range Comment   aPTT 29  24 - 37 (seconds)   PROTIME-INR     Status: Normal   Collection Time   08/21/11  8:13 AM      Component Value Range Comment   Prothrombin Time 13.6  11.6 - 15.2 (seconds)    INR 1.02  0.00 - 1.49    CARDIAC PANEL(CRET KIN+CKTOT+MB+TROPI)     Status: Abnormal   Collection Time   08/21/11  8:13 AM      Component Value Range Comment   Total CK 59  7 - 232 (U/L)    CK, MB 4.1 (*) 0.3 - 4.0 (ng/mL)    Troponin I <0.30  <0.30 (ng/mL)    Relative Index RELATIVE INDEX IS INVALID  0.0 - 2.5    MRSA PCR SCREENING     Status:  Normal   Collection Time   08/21/11 10:54 AM      Component Value Range Comment   MRSA by PCR NEGATIVE  NEGATIVE    CARDIAC PANEL(CRET KIN+CKTOT+MB+TROPI)     Status: Abnormal   Collection Time   08/21/11  4:00 PM      Component Value Range Comment   Total CK 535 (*) 7 - 232 (U/L)    CK, MB 61.9 (*) 0.3 - 4.0 (ng/mL)    Troponin I 21.24 (*) <0.30 (ng/mL)    Relative Index 11.6 (*) 0.0 - 2.5    HEPARIN  LEVEL (UNFRACTIONATED)     Status: Normal   Collection Time   08/22/11  3:31 AM      Component Value Range Comment   Heparin Unfractionated 0.56  0.30 - 0.70 (IU/mL)   HEPARIN LEVEL (UNFRACTIONATED)     Status: Normal   Collection Time   08/22/11  9:56 AM      Component Value Range Comment   Heparin Unfractionated 0.43  0.30 - 0.70 (IU/mL)   CBC     Status: Abnormal   Collection Time   08/22/11  9:56 AM      Component Value Range Comment   WBC 6.0  4.0 - 10.5 (K/uL)    RBC 3.30 (*) 4.22 - 5.81 (MIL/uL)    Hemoglobin 11.9 (*) 13.0 - 17.0 (g/dL)    HCT 42.5 (*) 95.6 - 52.0 (%)    MCV 107.3 (*) 78.0 - 100.0 (fL)    MCH 36.1 (*) 26.0 - 34.0 (pg)    MCHC 33.6  30.0 - 36.0 (g/dL)    RDW 38.7  56.4 - 33.2 (%)    Platelets 158  150 - 400 (K/uL)    EKG: normal EKG, normal sinus rhythm, unchanged from previous tracings, frequent PVC's noted.  ROS: only positives for pre hosp. Cp as above hpi.    Physical Exam: Filed Vitals:   08/22/11 1030  BP: 138/59  Pulse: 58  Temp:   Resp: 15     General: alert wm , nad pleasant HEENT: [place] , mmm, nonicteric eomi Neck: no jvd Heart: Mild Brady and Regular heart arte 58/ 2/6 sem lsb, no rub Lungs: CTA no rales or wheezes Abdomen: bs++, soft ,nontender Extremities:  No pedal edema Skin:  No rash or overt ulcers Neuro:  0x3, no focal deficits Dialysis Access:  Pos. Bruit  L UA AVF  Dialysis Orders: Center: [facility]  on tthsat . EDW 76kg HD Bath 2.0 CA, 2.0 K  Time 3hr Heparin tight. Access left upper arm AVF  BFR 400 DFR 800    Zemplar 4.0 mcg mcg IV/HD Epogen 0   Units IV/HD  Venofer  0  Other 0  Assessment/PlaNn: 1. NSTEMI with ho CAD and Atrial FIB RVR= now in sinus, Card. Planning card cath. Monday 2. ESRD= usually TTHSAT RIEDS. = HD late today or in am . K=4.8 lings clear on cxr  . 3. Metabolic bone disease -  Ca.=11.7  Yesterday now 9.7 , usually on 2.0 ca bath op center ,will use 2.25ca bath and hold zemplar 4.0 mcg on Renvela and  Sensipar, fu ca phos 4. Anemia = hfb=11.9 no epo, fu hgb, on iv hep  With ho gi bleed 5.  HTN/VOL=  Stable vol on cxr. Op meds only takes Lopressor 25mg  bid  Lenny Pastel, PA-C [facility] Beeper [phone] 08/22/2011, 11:10 AM

## 2011-08-22 NOTE — Progress Notes (Signed)
[facility]  Subjective: Doing well.  Was transferred from [facility] after 12AM last night.  No CP now but during Afib he was having chest pressure and tightness.  No SOB  Objective: Vital signs in last 24 hours: Temp:  [97.5 F (36.4 C)-98.2 F (36.8 C)] 98 F (36.7 C) (05/11 0400) Pulse Rate:  [50-140] 52  (05/11 0700) Resp:  [13-22] 14  (05/11 0700) BP: (72-129)/(46-92) 94/46 mmHg (05/11 0700) SpO2:  [94 %-100 %] 99 % (05/11 0700) Weight:  [75.751 kg (167 lb)-76.6 kg (168 lb 14 oz)] 76.6 kg (168 lb 14 oz) (05/10 1110) Last BM Date: 08/19/11  Intake/Output from previous day: 05/10 0701 - 05/11 0700 In: 1095.7 [P.O.:960; I.V.:135.7] Out: -  Intake/Output this shift:    Medications Current Facility-Administered Medications  Medication Dose Route Frequency Provider Last Rate Last Dose  . 0.9 %  sodium chloride infusion   Intravenous Continuous [name], MD 10 mL/hr at 08/22/11 0500    . allopurinol (ZYLOPRIM) tablet 100 mg  100 mg Oral BID [name], MD   100 mg at 08/21/11 2158  . aspirin chewable tablet 324 mg  324 mg Oral Once Dayton Bailiff, MD   324 mg at 08/21/11 0817  . aspirin tablet 325 mg  325 mg Oral Daily Angus Edilia Bo, MD   325 mg at 08/21/11 1330  . atorvastatin (LIPITOR) tablet 40 mg  40 mg Oral Daily Angus Edilia Bo, MD   40 mg at 08/21/11 1331  . b complex-vitamin c-folic acid (NEPHRO-VITE) tablet 1 tablet  1 tablet Oral Daily [name], MD   1 tablet at 08/21/11 1331  . clopidogrel (PLAVIX) tablet 75 mg  75 mg Oral Q breakfast Governor Rooks, MD   75 mg at 08/21/11 1330  . diltiazem (CARDIZEM) injection SOLN 20 mg  20 mg Intravenous Once Dayton Bailiff, MD   10 mg at 08/21/11 0839  . enalapril (VASOTEC) tablet 10 mg  10 mg Oral QHS Angus G McInnis, MD      . famotidine (PEPCID) tablet 10 mg  10 mg Oral Daily Angus G McInnis, MD   10 mg at 08/21/11 1330  . heparin 100-0.45 UNIT/ML-% infusion      9 mL/hr at 08/22/11  0500    . heparin ADULT infusion 100 units/mL (25000 units/250 mL)  12 Units/kg/hr Intravenous Continuous [facility], PHARMD 9 mL/hr at 08/21/11 1800 12 Units/kg/hr at 08/21/11 1800  . heparin bolus via infusion 4,000 Units  4,000 Units Intravenous Once Elson Clan, PHARMD   4,000 Units at 08/21/11 1801  . isosorbide mononitrate (IMDUR) 24 hr tablet 30 mg  30 mg Oral Daily Angus Edilia Bo, MD      . metoprolol tartrate (LOPRESSOR) tablet 25 mg  25 mg Oral BID [name], MD      . nitroGLYCERIN (NITROSTAT) SL tablet 0.4 mg  0.4 mg Sublingual Q5 min PRN Dayton Bailiff, MD      . nitroGLYCERIN (NITROSTAT) SL tablet 0.4 mg  0.4 mg Sublingual Q5 min PRN [name], MD      . nitroGLYCERIN 0.2 mg/mL in dextrose 5 % infusion  2-200 mcg/min Intravenous Titrated Dwana Melena, PA 1.5 mL/hr at 08/22/11 0500 5 mcg/min at 08/22/11 0500  . pantoprazole (PROTONIX) EC tablet 40 mg  40 mg Oral BID [name], MD   40 mg at 08/21/11 2158  . sevelamer (RENVELA) tablet 2,400 mg  2,400 mg Oral TID WC Angus Edilia Bo, MD   2,400 mg at 08/21/11 1651  . sodium chloride 0.9 % bolus 1,000 mL  1,000 mL Intravenous Once Dayton Bailiff, MD   1,000 mL at 08/21/11 0850  . sodium chloride 0.9 % injection           . DISCONTD: 0.9 %  sodium chloride infusion   Intravenous STAT Dayton Bailiff, MD      . DISCONTD: 0.9 %  sodium chloride infusion   Intravenous Continuous Angus Edilia Bo, MD      . DISCONTD: diltiazem (CARDIZEM) 100 mg in dextrose 5 % 100 mL infusion  5-15 mg/hr Intravenous Titrated Dayton Bailiff, MD   5 mg/hr at 08/21/11 [phone]  . DISCONTD: enoxaparin (LOVENOX) injection 30 mg  30 mg Subcutaneous Q24H Angus Edilia Bo, MD   30 mg at 08/21/11 1331    PE: General appearance: alert, cooperative and no distress Lungs: clear to auscultation bilaterally Heart: Rhythm is regular. Rate is slow.  3/6 Systolic MM at the LSB   Extremities: No LEE Pulses: DPs 2+ and symmetric.  2+ right  radial  Lab Results:   Basename 08/21/11 0813  WBC 7.7  HGB 14.7  HCT 44.9  PLT 211   BMET  Basename 08/21/11 0813  NA 139  K 4.0  CL 91*  CO2 26  GLUCOSE 129*  BUN 35*  CREATININE 8.00*  CALCIUM 11.7*   PT/INR  Basename 08/21/11 0813  LABPROT 13.6  INR 1.02    Assessment/Plan   Principal Problem:  *NSTEMI (non-ST elevated myocardial infarction) Active Problems:  Atrial fibrillation with rapid ventricular response  Chronic kidney failure: HD T, TH, Sat  CAD (coronary artery disease)  HTN (hypertension)  PAD (peripheral artery disease): 60% bilateral common iliac stenosis   HLD (hyperlipidemia)  Arthritis  Bradycardia  Plan:   No CP now but during Afib he was having chest pressure and tightness.  Maintaining sinus bradycardia now.  Know CAD.  Considerable ischemic changes on EKG(ST depression) have improved.  Troponin 21.24.   Posterior wall myocardial infarction in December of 2012. He was found to have a totally occluded circumflex artery and he went underwent stenting of this with a 3.0 her metal stent-15 mm successfully. He had associated 80-90% proximal LAD disease after the second diagonal and associated 80-90% stenosis of a moderate size second diagonal branch. The right coronary artery was dominant and had 40% mid and 50% PDA.  IV heparin and IV nitro currently hanging.   Will schedule left heart cath for Monday.  Continue to check CE.  Dr. Allena Katz has been consulted for HD today.(Thank You!)    LOS: 1 day    Sina Lucchesi W 08/22/2011 7:51 AM

## 2011-08-22 NOTE — Progress Notes (Signed)
Pt. Seen and examined. Agree with the NP/PA-C note as written.  He is chest pain free now. Rate controlled a-fib. Troponin ~21 - unusual jump from negative, ?spurious value. Await next cardiac enzyme set - labs not drawn this am. Check 2D echocardiogram today for new wall motion abnormalities. Treating for ACS.  Will need to dialyze this weekend to optimize for probably LHC on Monday.  Chrystie Nose, MD, [facility] Attending Cardiologist [facility]

## 2011-08-23 ENCOUNTER — Inpatient Hospital Stay (HOSPITAL_COMMUNITY): Payer: Medicare Other

## 2011-08-23 LAB — HEPARIN LEVEL (UNFRACTIONATED): Heparin Unfractionated: 0.29 IU/mL — ABNORMAL LOW (ref 0.30–0.70)

## 2011-08-23 LAB — CBC
HCT: 31.5 % — ABNORMAL LOW (ref 39.0–52.0)
Hemoglobin: 10.9 g/dL — ABNORMAL LOW (ref 13.0–17.0)
MCV: 105.7 fL — ABNORMAL HIGH (ref 78.0–100.0)
RBC: 2.98 MIL/uL — ABNORMAL LOW (ref 4.22–5.81)
RDW: 14 % (ref 11.5–15.5)
WBC: 6.5 10*3/uL (ref 4.0–10.5)

## 2011-08-23 LAB — FERRITIN: Ferritin: 256 ng/mL (ref 22–322)

## 2011-08-23 LAB — IRON AND TIBC: Saturation Ratios: 20 % (ref 20–55)

## 2011-08-23 MED ORDER — HEPARIN (PORCINE) IN NACL 100-0.45 UNIT/ML-% IJ SOLN
1000.0000 [IU]/h | INTRAMUSCULAR | Status: DC
  Administered 2011-08-23: 1000 [IU]/h via INTRAVENOUS
  Filled 2011-08-23 (×2): qty 250

## 2011-08-23 MED ORDER — DARBEPOETIN ALFA-POLYSORBATE 100 MCG/0.5ML IJ SOLN
INTRAMUSCULAR | Status: AC
  Administered 2011-08-23: 100 ug via INTRAVENOUS
  Filled 2011-08-23: qty 0.5

## 2011-08-23 MED ORDER — DARBEPOETIN ALFA-POLYSORBATE 100 MCG/0.5ML IJ SOLN
100.0000 ug | Freq: Once | INTRAMUSCULAR | Status: AC
  Administered 2011-08-23: 100 ug via INTRAVENOUS
  Filled 2011-08-23: qty 0.5

## 2011-08-23 NOTE — Progress Notes (Signed)
ANTICOAGULATION CONSULT NOTE - Follow Up Consult  Pharmacy Consult for heparin Indication: chest pain/ACS  Labs:  Basename 08/23/11 0520 08/22/11 0956 08/22/11 0331 08/21/11 1600 08/21/11 0813  HGB 10.9* 11.9* -- -- --  HCT 31.5* 35.4* -- -- 44.9  PLT 149* 158 -- -- 211  APTT -- -- -- -- 29  LABPROT -- -- -- -- 13.6  INR -- -- -- -- 1.02  HEPARINUNFRC 0.29* 0.43 0.56 -- --  CREATININE -- 10.09* -- -- 8.00*  CKTOTAL -- 199 -- 535* 59  CKMB -- 26.5* -- 61.9* 4.1*  TROPONINI -- 16.56* -- 21.24* <0.30    Assessment: 71yo male had been therapeutic on heparin with levels trending down, now slightly below goal.  Goal of Therapy:  Heparin level 0.3-0.7 units/ml   Plan:  Will increase heparin gtt to 1000 units/hr and check level in 8hr.  Colleen Can PharmD BCPS 08/23/2011,6:32 AM

## 2011-08-23 NOTE — Progress Notes (Signed)
Patient ID: DEANTA MINCEY, male   DOB: 1940-09-22, 71 y.o.   MRN: [id number]   [facility] Progress Note    Subjective:   Reports no chest pain overnight and denies any shortness of breath.    Objective:   BP 106/64  Pulse 61  Temp(Src) 98.7 F (37.1 C) (Oral)  Resp 14  Ht 5\' 7"  (1.702 m)  Wt 77.4 kg (170 lb 10.2 oz)  BMI 26.73 kg/m2  SpO2 99%  Physical Exam: WUJ:[id number] resting in bed AOZ:HYQMV RRR, blowing HSM over outflow tract Resp:CTA bilaterally, no rales/rhonchi HQI:ONGE, flat, NT, BS normal Ext:No LE edema. LUA AVF with good thrill/bruit  Labs: BMET  Lab 08/22/11 0956 08/21/11 0813  NA 136 139  K 4.8 4.0  CL 93* 91*  CO2 21 26  GLUCOSE 110* 129*  BUN 52* 35*  CREATININE 10.09* 8.00*  ALB -- --  CALCIUM 9.4 11.7*  PHOS -- --   CBC  Lab 08/23/11 0520 08/22/11 0956 08/21/11 0813  WBC 6.5 6.0 7.7  NEUTROABS -- -- 2.8  HGB 10.9* 11.9* 14.7  HCT 31.5* 35.4* 44.9  MCV 105.7* 107.3* 110.3*  PLT 149* 158 211    Medications:      . allopurinol  100 mg Oral BID  . aspirin  325 mg Oral Daily  . atorvastatin  40 mg Oral Daily  . b complex-vitamin c-folic acid  1 tablet Oral Daily  . clopidogrel  75 mg Oral Q breakfast  . enalapril  10 mg Oral QHS  . isosorbide mononitrate  30 mg Oral Daily  . metoprolol tartrate  25 mg Oral BID  . pantoprazole  40 mg Oral BID  . sevelamer  2,400 mg Oral TID WC  . DISCONTD: famotidine  10 mg Oral Daily     Assessment/ Plan:   1. NSTEMI with ho CAD and Atrial FIB RVR= currently NSR, Planned card cath. Monday 2. ESRD= usually TTS [place]. = HD later today as he could not get this done yesterday. 3. Metabolic bone disease -  usually on 2.0 ca bath op center ,will use 2.25ca bath and hold zemplar 4.0 mcg on Renvela and Sensipar, fu ca phos 4. Anemia = hgb=10.9 , restart ESA today and monitor Tsat 5. HTN/VOL= Good BP control on current regime and will improve with UF on HD.   Zetta Bills,  MD 08/23/2011, 9:20 AM

## 2011-08-23 NOTE — Progress Notes (Signed)
Pt. Seen and examined. Agree with the NP/PA-C note as written.  I have reviewed his echo this morning. There is an inferior wall motion abnormality, but relatively preserved LV function. Stage 2 diastolic dysfunction with high filling pressures. There is moderate to severe aortic stenosis with an AVA of 1.0-1.1 cm2. Plan for dialysis with some volume removal to optimize for cardiac cath in am tomorrow with Dr. [name]. NPO p MN.  Chrystie Nose, MD, [employer] Attending Cardiologist [facility]

## 2011-08-23 NOTE — Progress Notes (Signed)
[facility]  Subjective: No CP or SOB  Objective: Vital signs in last 24 hours: Temp:  [97.8 F (36.6 C)-99.3 F (37.4 C)] 98.7 F (37.1 C) (05/12 0400) Pulse Rate:  [53-61] 61  (05/11 2158) Resp:  [13-20] 16  (05/12 0400) BP: (99-147)/(45-96) 113/52 mmHg (05/12 0400) SpO2:  [96 %-100 %] 99 % (05/12 0003) FiO2 (%):  [2 %] 2 % (05/11 1530) Last BM Date: 08/19/11  Intake/Output from previous day: 05/11 0701 - 05/12 0700 In: 1031.5 [P.O.:720; I.V.:311.5] Out: 1 [Stool:1] Intake/Output this shift:    Medications Current Facility-Administered Medications  Medication Dose Route Frequency Provider Last Rate Last Dose  . 0.9 %  sodium chloride infusion   Intravenous Continuous [name], MD 10 mL/hr at 08/22/11 2300    . allopurinol (ZYLOPRIM) tablet 100 mg  100 mg Oral BID [name], MD   100 mg at 08/22/11 2159  . aluminum hydroxide (AMPHOJEL/ALTERNAGEL) suspension 30 mL  30 mL Oral Q6H PRN Dwana Melena, PA      . aspirin tablet 325 mg  325 mg Oral Daily [name], MD   325 mg at 08/22/11 0957  . atorvastatin (LIPITOR) tablet 40 mg  40 mg Oral Daily [name], MD   40 mg at 08/22/11 0957  . b complex-vitamin c-folic acid (NEPHRO-VITE) tablet 1 tablet  1 tablet Oral Daily [name], MD   1 tablet at 08/22/11 0957  . clopidogrel (PLAVIX) tablet 75 mg  75 mg Oral Q breakfast Governor Rooks, MD   75 mg at 08/22/11 0932  . enalapril (VASOTEC) tablet 10 mg  10 mg Oral QHS Angus G McInnis, MD   10 mg at 08/22/11 2158  . heparin ADULT infusion 100 units/mL (25000 units/250 mL)  1,000 Units/hr Intravenous Continuous Colleen Can, PHARMD 10 mL/hr at 08/23/11 0630 1,000 Units/hr at 08/23/11 0630  . isosorbide mononitrate (IMDUR) 24 hr tablet 30 mg  30 mg Oral Daily [name], MD   30 mg at 08/22/11 0956  . metoprolol tartrate (LOPRESSOR) tablet 25 mg  25 mg Oral BID [name], MD   25 mg at 08/22/11 2158  .  nitroGLYCERIN (NITROSTAT) SL tablet 0.4 mg  0.4 mg Sublingual Q5 min PRN Dayton Bailiff, MD      . nitroGLYCERIN (NITROSTAT) SL tablet 0.4 mg  0.4 mg Sublingual Q5 min PRN [name], MD      . nitroGLYCERIN 0.2 mg/mL in dextrose 5 % infusion  2-200 mcg/min Intravenous Titrated Dwana Melena, PA 1.5 mL/hr at 08/22/11 0900 5 mcg/min at 08/22/11 0900  . pantoprazole (PROTONIX) EC tablet 40 mg  40 mg Oral BID [name], MD   40 mg at 08/22/11 2158  . sevelamer (RENVELA) tablet 2,400 mg  2,400 mg Oral TID WC Angus Edilia Bo, MD   2,400 mg at 08/22/11 1731  . DISCONTD: famotidine (PEPCID) tablet 10 mg  10 mg Oral Daily [name], MD   10 mg at 08/22/11 0956  . DISCONTD: heparin ADULT infusion 100 units/mL (25000 units/250 mL)  12 Units/kg/hr Intravenous Continuous Elson Clan, PHARMD 9 mL/hr at 08/22/11 0700 11.749 Units/kg/hr at 08/22/11 0700    PE: General appearance: alert, cooperative and no distress Lungs: Slight wheeze on the right, mid lung Heart: regular rate and rhythm, 3/6 sys MM Extremities: No LEE Pulses: DPs 2+ and symmetric  Lab Results:   Basename 08/23/11 0520  08/22/11 0956 08/21/11 0813  WBC 6.5 6.0 7.7  HGB 10.9* 11.9* 14.7  HCT 31.5* 35.4* 44.9  PLT 149* 158 211   BMET  Basename 08/22/11 0956 08/21/11 0813  NA 136 139  K 4.8 4.0  CL 93* 91*  CO2 21 26  GLUCOSE 110* 129*  BUN 52* 35*  CREATININE 10.09* 8.00*  CALCIUM 9.4 11.7*   PT/INR  Basename 08/21/11 0813  LABPROT 13.6  INR 1.02   Cholesterol No results found for this basename: CHOL in the last 72 hours Cardiac Enzymes CK, MB 4.1 61.9 26.5 Total CK 59 535 199 Troponin I <0.30,  21.24,  16.56   Studies/Results: 2D Echo    Assessment/Plan  Principal Problem:  *NSTEMI (non-ST elevated myocardial infarction) Active Problems:  Atrial fibrillation with rapid ventricular response  Chronic kidney failure: HD T, TH, Sat  CAD (coronary artery disease)  HTN (hypertension)   PAD (peripheral artery disease): 60% bilateral common iliac stenosis   HLD (hyperlipidemia)  Arthritis  Bradycardia  Plan:  Troponin decreased from 21.24 to 16.56.  Pain free. Hemodialysis today.  Left heart cath tomorrow with Dr. Berry(Preferred by Pt).  BP controlled and stable. Sinus brady on tele.  2D echo pending read.   LOS: 2 days    Railee Bonillas W 08/23/2011 7:43 AM

## 2011-08-23 NOTE — Progress Notes (Signed)
ANTICOAGULATION CONSULT NOTE - Follow Up Consult  Pharmacy Consult for heparin Indication: chest pain/ACS  No Known Allergies  Patient Measurements: Height: 5\' 7"  (170.2 cm) Weight: 170 lb 10.2 oz (77.4 kg) IBW/kg (Calculated) : 66.1  Heparin Dosing Weight:   Vital Signs: BP: 110/44 mmHg (05/12 1400)  Labs:  Basename 08/23/11 1524 08/23/11 0520 08/22/11 0956 08/21/11 1600 08/21/11 0813  HGB -- 10.9* 11.9* -- --  HCT -- 31.5* 35.4* -- 44.9  PLT -- 149* 158 -- 211  APTT -- -- -- -- 29  LABPROT -- -- -- -- 13.6  INR -- -- -- -- 1.02  HEPARINUNFRC 0.32 0.29* 0.43 -- --  CREATININE -- -- 10.09* -- 8.00*  CKTOTAL -- -- 199 535* 59  CKMB -- -- 26.5* 61.9* 4.1*  TROPONINI -- -- 16.56* 21.24* <0.30    Estimated Creatinine Clearance: 6.5 ml/min (by C-G formula based on Cr of 10.09).   Medications:  Scheduled:    . allopurinol  100 mg Oral BID  . aspirin  325 mg Oral Daily  . atorvastatin  40 mg Oral Daily  . b complex-vitamin c-folic acid  1 tablet Oral Daily  . clopidogrel  75 mg Oral Q breakfast  . darbepoetin (ARANESP) injection - DIALYSIS  100 mcg Intravenous Once  . enalapril  10 mg Oral QHS  . isosorbide mononitrate  30 mg Oral Daily  . metoprolol tartrate  25 mg Oral BID  . pantoprazole  40 mg Oral BID  . sevelamer  2,400 mg Oral TID WC  . DISCONTD: famotidine  10 mg Oral Daily   Infusions:    . sodium chloride 10 mL/hr at 08/22/11 2300  . heparin 1,000 Units/hr (08/23/11 0630)  . nitroGLYCERIN 5 mcg/min (08/22/11 0900)  . DISCONTD: heparin 11.749 Units/kg/hr (08/22/11 0700)    Assessment: 71 yo male with ACS is currently on therapeutic heparin.  Heparin level 0.32 Goal of Therapy:  Heparin level 0.3-0.7 units/ml Monitor platelets by anticoagulation protocol: Yes   Plan:  1) Continue heparin at 1000 units/hr. 2) Heparin level and CBC in am.  Kylan Liberati, Tsz-Yin 08/23/2011,4:16 PM

## 2011-08-24 ENCOUNTER — Encounter (HOSPITAL_COMMUNITY)
Admission: EM | Disposition: A | Discharge status: Home / Self Care | Payer: Self-pay | Source: Home / Self Care | Attending: Family Medicine

## 2011-08-24 HISTORY — PX: LEFT HEART CATHETERIZATION WITH CORONARY ANGIOGRAM: SHX5451

## 2011-08-24 LAB — CBC
HCT: 34.7 % — ABNORMAL LOW (ref 39.0–52.0)
Hemoglobin: 11.7 g/dL — ABNORMAL LOW (ref 13.0–17.0)
MCH: 36.3 pg — ABNORMAL HIGH (ref 26.0–34.0)
MCHC: 33.7 g/dL (ref 30.0–36.0)
MCV: 107.8 fL — ABNORMAL HIGH (ref 78.0–100.0)
Platelets: 159 K/uL (ref 150–400)
RBC: 3.22 MIL/uL — ABNORMAL LOW (ref 4.22–5.81)
RDW: 14.3 % (ref 11.5–15.5)
WBC: 5.3 K/uL (ref 4.0–10.5)

## 2011-08-24 LAB — PLATELET INHIBITION P2Y12: Platelet Function  P2Y12: 327 [PRU] (ref 194–418)

## 2011-08-24 LAB — POCT ACTIVATED CLOTTING TIME: Activated Clotting Time: 160 seconds

## 2011-08-24 LAB — HEPARIN LEVEL (UNFRACTIONATED): Heparin Unfractionated: 0.37 [IU]/mL (ref 0.30–0.70)

## 2011-08-24 SURGERY — LEFT HEART CATHETERIZATION WITH CORONARY ANGIOGRAM
Anesthesia: LOCAL

## 2011-08-24 MED ORDER — SODIUM CHLORIDE 0.9 % IV SOLN: 250.0000 mL | INTRAVENOUS | Status: DC | PRN

## 2011-08-24 MED ORDER — LIDOCAINE HCL (PF) 1 % IJ SOLN
INTRAMUSCULAR | Status: AC
  Filled 2011-08-24: qty 30

## 2011-08-24 MED ORDER — SODIUM CHLORIDE 0.9 % IV SOLN: 250.0000 mL | INTRAVENOUS | Status: DC

## 2011-08-24 MED ORDER — TICAGRELOR 90 MG PO TABS
90.0000 mg | ORAL_TABLET | Freq: Two times a day (BID) | ORAL | Status: DC
  Administered 2011-08-25 – 2011-08-26 (×3): 90 mg via ORAL
  Filled 2011-08-24 (×5): qty 1

## 2011-08-24 MED ORDER — MORPHINE SULFATE 4 MG/ML IJ SOLN
2.0000 mg | INTRAMUSCULAR | Status: DC | PRN
  Administered 2011-08-24: 2 mg via INTRAVENOUS
  Filled 2011-08-24: qty 1

## 2011-08-24 MED ORDER — MIDAZOLAM HCL 2 MG/2ML IJ SOLN
INTRAMUSCULAR | Status: AC
  Filled 2011-08-24: qty 2

## 2011-08-24 MED ORDER — ATROPINE SULFATE 1 MG/ML IJ SOLN
INTRAMUSCULAR | Status: AC
  Filled 2011-08-24: qty 1

## 2011-08-24 MED ORDER — SODIUM CHLORIDE 0.9 % IJ SOLN
3.0000 mL | Freq: Two times a day (BID) | INTRAMUSCULAR | Status: DC
  Administered 2011-08-24 – 2011-08-26 (×4): 3 mL via INTRAVENOUS

## 2011-08-24 MED ORDER — ONDANSETRON HCL 4 MG/2ML IJ SOLN: 4.0000 mg | Freq: Four times a day (QID) | INTRAMUSCULAR | Status: DC | PRN

## 2011-08-24 MED ORDER — FENTANYL CITRATE 0.05 MG/ML IJ SOLN
INTRAMUSCULAR | Status: AC
  Filled 2011-08-24: qty 2

## 2011-08-24 MED ORDER — NITROGLYCERIN 0.2 MG/ML ON CALL CATH LAB
INTRAVENOUS | Status: AC
  Filled 2011-08-24: qty 1

## 2011-08-24 MED ORDER — TICAGRELOR 90 MG PO TABS
ORAL_TABLET | ORAL | Status: AC
  Filled 2011-08-24: qty 2

## 2011-08-24 MED ORDER — ASPIRIN 81 MG PO CHEW
81.0000 mg | CHEWABLE_TABLET | Freq: Every day | ORAL | Status: DC
  Administered 2011-08-25 – 2011-08-26 (×2): 81 mg via ORAL
  Filled 2011-08-24: qty 1

## 2011-08-24 MED ORDER — DIAZEPAM 5 MG PO TABS
5.0000 mg | ORAL_TABLET | Freq: Once | ORAL | Status: AC
  Administered 2011-08-24: 5 mg via ORAL

## 2011-08-24 MED ORDER — ACETAMINOPHEN 325 MG PO TABS: 650.0000 mg | ORAL_TABLET | ORAL | Status: DC | PRN

## 2011-08-24 MED ORDER — SODIUM CHLORIDE 0.9 % IJ SOLN: 3.0000 mL | INTRAMUSCULAR | Status: DC | PRN

## 2011-08-24 MED ORDER — BIVALIRUDIN 250 MG IV SOLR
INTRAVENOUS | Status: AC
  Filled 2011-08-24: qty 250

## 2011-08-24 MED ORDER — ASPIRIN 81 MG PO CHEW
324.0000 mg | CHEWABLE_TABLET | ORAL | Status: AC
  Administered 2011-08-24: 324 mg via ORAL
  Filled 2011-08-24: qty 4

## 2011-08-24 MED ORDER — SEVELAMER CARBONATE 800 MG PO TABS
2400.0000 mg | ORAL_TABLET | Freq: Once | ORAL | Status: AC
Start: 2011-08-24 — End: 2011-08-24
  Administered 2011-08-24: 2400 mg via ORAL
  Filled 2011-08-24: qty 3

## 2011-08-24 MED ORDER — DIAZEPAM 5 MG PO TABS
ORAL_TABLET | ORAL | Status: AC
  Administered 2011-08-24: 5 mg via ORAL
  Filled 2011-08-24: qty 1

## 2011-08-24 MED ORDER — HEPARIN (PORCINE) IN NACL 2-0.9 UNIT/ML-% IJ SOLN
INTRAMUSCULAR | Status: AC
  Filled 2011-08-24: qty 2000

## 2011-08-24 MED ORDER — PAROXETINE HCL 20 MG PO TABS
20.0000 mg | ORAL_TABLET | Freq: Every day | ORAL | Status: DC
  Administered 2011-08-24 – 2011-08-26 (×3): 20 mg via ORAL
  Filled 2011-08-24 (×3): qty 1

## 2011-08-24 MED ORDER — SODIUM CHLORIDE 0.9 % IV SOLN: 1.0000 mL/kg/h | INTRAVENOUS | Status: DC

## 2011-08-24 MED ORDER — HEPARIN SODIUM (PORCINE) 1000 UNIT/ML DIALYSIS
20.0000 [IU]/kg | INTRAMUSCULAR | Status: DC | PRN
  Administered 2011-08-25: 1600 [IU] via INTRAVENOUS_CENTRAL

## 2011-08-24 MED ORDER — SODIUM CHLORIDE 0.9 % IJ SOLN: 3.0000 mL | Freq: Two times a day (BID) | INTRAMUSCULAR | Status: DC

## 2011-08-24 NOTE — Care Management Note (Addendum)
    Page 1 of 1   08/26/2011     10:50:19 AM   CARE MANAGEMENT NOTE 08/26/2011  Patient:  Roger [name], Roger [name]   Account Number:  [id number]  Date Initiated:  08/24/2011  Documentation initiated by:  Junius Creamer  Subjective/Objective Assessment:   adm w at fib w rvr     Action/Plan:   lives w wife, pcp dr Thalia Party mcginnis   Anticipated DC Date:  08/27/2011   Anticipated DC Plan:  HOME/SELF CARE      DC Planning Services  CM consult  Medication Assistance      Choice offered to / List presented to:             Status of service:   Medicare Important Message given?   (If response is "NO", the following Medicare IM given date fields will be blank) Date Medicare IM given:   Date Additional Medicare IM given:    Discharge Disposition:  HOME/SELF CARE  Per UR Regulation:  Reviewed for med. necessity/level of care/duration of stay  If discussed at Long Length of Stay Meetings, dates discussed:    Comments:  5/15 10:47a debbie Burrell Hodapp rn,bsn pt has card for free 30days of brilinta, he will need 1 prescription for 30days free and prescription w refills after that. spoke again w pt-wife-da. gave them pt assist forms for meds not on 4.00 list. da also has website to look up meds that may be ordered in future.  08/25/11 11:46a debbie Orvan Papadakis rn,bsn pt has 45.00 copay for brilinta.will leave sticky note for md.  08/24/11 13:45p debbie Yulieth Carrender rn,bsn [phone] getting cm sec to ck on copay for brilinta.

## 2011-08-24 NOTE — Progress Notes (Signed)
[facility]  Subjective: Anxious about heart cath procedure.  Objective: Vital signs in last 24 hours: Temp:  [97.8 F (36.6 C)-99.2 F (37.3 C)] 97.8 F (36.6 C) (05/13 0804) Pulse Rate:  [56-62] 56  (05/13 0804) Resp:  [12-20] 16  (05/13 0804) BP: (99-137)/(44-67) 117/60 mmHg (05/13 0804) SpO2:  [96 %-98 %] 96 % (05/13 0804) Weight:  [77.45 kg (170 lb 11.9 oz)-80.05 kg (176 lb 7.7 oz)] 77.45 kg (170 lb 11.9 oz) (05/13 0227) Last BM Date: 08/19/11  Intake/Output from previous day: 05/12 0701 - 05/13 0700 In: 1077.5 [P.O.:780; I.V.:297.5] Out: 2004  Intake/Output this shift:    Medications Current Facility-Administered Medications  Medication Dose Route Frequency Provider Last Rate Last Dose  . 0.9 %  sodium chloride infusion   Intravenous Continuous [name], MD 10 mL/hr at 08/22/11 2300    . 0.9 %  sodium chloride infusion  250 mL Intravenous PRN Dwana Melena, PA      . allopurinol (ZYLOPRIM) tablet 100 mg  100 mg Oral BID [name], MD   100 mg at 08/24/11 0113  . aluminum hydroxide (AMPHOJEL/ALTERNAGEL) suspension 30 mL  30 mL Oral Q6H PRN Dwana Melena, PA   30 mL at 08/23/11 2053  . aspirin chewable tablet 324 mg  324 mg Oral Pre-Cath Kelle Darting [place], [place]   324 mg at 08/24/11 0533  . aspirin tablet 325 mg  325 mg Oral Daily Dwana Melena, PA   325 mg at 08/23/11 1014  . atorvastatin (LIPITOR) tablet 40 mg  40 mg Oral Daily Angus Edilia Bo, MD   40 mg at 08/23/11 1014  . b complex-vitamin c-folic acid (NEPHRO-VITE) tablet 1 tablet  1 tablet Oral Daily [name], MD   1 tablet at 08/24/11 0112  . clopidogrel (PLAVIX) tablet 75 mg  75 mg Oral Q breakfast Governor Rooks, MD   75 mg at 08/23/11 0809  . darbepoetin (ARANESP) injection 100 mcg  100 mcg Intravenous Once Jay K. Allena Katz, MD   100 mcg at 08/23/11 2146  . enalapril (VASOTEC) tablet 10 mg  10 mg Oral QHS Angus G McInnis, MD   10 mg at 08/24/11 0114  . heparin ADULT  infusion 100 units/mL (25000 units/250 mL)  1,000 Units/hr Intravenous Continuous Colleen Can, PHARMD 10 mL/hr at 08/23/11 1842 1,000 Units/hr at 08/23/11 1842  . isosorbide mononitrate (IMDUR) 24 hr tablet 30 mg  30 mg Oral Daily Angus Edilia Bo, MD   30 mg at 08/23/11 1014  . metoprolol tartrate (LOPRESSOR) tablet 25 mg  25 mg Oral BID [name], MD   25 mg at 08/24/11 0115  . nitroGLYCERIN (NITROSTAT) SL tablet 0.4 mg  0.4 mg Sublingual Q5 min PRN Dayton Bailiff, MD      . nitroGLYCERIN (NITROSTAT) SL tablet 0.4 mg  0.4 mg Sublingual Q5 min PRN [name], MD      . nitroGLYCERIN 0.2 mg/mL in dextrose 5 % infusion  2-200 mcg/min Intravenous Titrated Dwana Melena, PA 1.5 mL/hr at 08/22/11 0900 5 mcg/min at 08/22/11 0900  . pantoprazole (PROTONIX) EC tablet 40 mg  40 mg Oral BID [name], MD   40 mg at 08/24/11 0112  . sevelamer (RENVELA) tablet 2,400 mg  2,400 mg Oral TID WC Angus Edilia Bo, MD   2,400 mg at 08/23/11 1736  . sodium chloride 0.9 % injection 3 mL  3 mL Intravenous Q12H  Dwana Melena, PA      . sodium chloride 0.9 % injection 3 mL  3 mL Intravenous PRN Dwana Melena, PA        PE: Gen:  Anxious about cath, Lungs: CTA B, non-labored Heart:  RRR, 3/6 systolic MM Abd: soft, NT/ND/NABS Ext:  No LEE Pulses:  2+ DPs. Neuro: A&O x3, CN II-XII grossly intact.  Lab Results:   Basename 08/24/11 0518 08/23/11 0520 08/22/11 0956  WBC 5.3 6.5 6.0  HGB 11.7* 10.9* 11.9*  HCT 34.7* 31.5* 35.4*  PLT 159 149* 158   BMET  Basename 08/22/11 0956  NA 136  K 4.8  CL 93*  CO2 21  GLUCOSE 110*  BUN 52*  CREATININE 10.09*  CALCIUM 9.4    Assessment/Plan   Principal Problem:  *NSTEMI (non-ST elevated myocardial infarction) Active Problems:  Atrial fibrillation with rapid ventricular response  Chronic kidney failure: HD T, TH, Sat  CAD (coronary artery disease)  HTN (hypertension)  PAD (peripheral artery disease): 60% bilateral common iliac stenosis     HLD (hyperlipidemia)  Arthritis  Bradycardia  Plan:  Patient did not HD until late last night and did not sleep last night.  Left heart cath today.  Maintaining NSR. 62BPM.  Added Paxil 20mg (home Med).  Valium 5mg  precath.     LOS: 3 days    HAGER,BRYAN W 08/24/2011 8:22 AM  ATTENDING ATTESTATION:  I have seen and examined the patient along with Wilburt Finlay, PA.  I have reviewed the chart, notes and new data.  I agree with Bryan's note.  Brief Description: 71 y/o man with h/o ESRD on HD, RAS (R Renal stent 2005) & Inf STEMi 12/11 (occluded LCx -- 3.0 mm x 15 mm BMS mid, PTCA of distal lesion, residual LAD 80% & Diag 90% --> negative Myoview, so no PCI) presented to [facility] ER with CP on Fri 4/10 with CP --> found to be in Afib RVR, converted with Diltiazem.  Troponin + to 21.  No further CP, Afib or CHF Sx.  Transferred to [facility] for further care with plan LHC +/- PCI today.   Key new complaints: Just anxious, tired since HD completed late last PM.  Key examination changes: AS murmur, RR  Key new findings / data: H/H stable, Troponin trend is downward  PLAN:  Proceed with LHC +/- PCI today --> will need to consider BMS vs. DES as his presentation wth Afib would require long-term anticoagulation for CVA prevention as CHADS2 score clearly would warrant this.    Prior films reviewed.  This procedure has been fully reviewed with the patient and written informed consent has been obtained.  Marykay Lex, M.D., M.S. [facility] [address] [place], [place]  [address]  [phone]  08/24/2011 8:54 AM

## 2011-08-24 NOTE — Progress Notes (Signed)
ACT result is 160. MD aware and OK'd sheath pull. 6 french sheath removed. Pressure held for 25 minutes. CIT. Pressure dressing applied. No hematoma. Pt. Given post sheath removal instructions.

## 2011-08-24 NOTE — Progress Notes (Signed)
Getting case magt sec to ck on copay for brlinta.

## 2011-08-24 NOTE — Progress Notes (Signed)
Patient ID: Roger [name], male   DOB: 1941-01-01, 71 y.o.   MRN: [id number] Patient ID: Roger [name], male   DOB: [birth year], 71 y.o.   MRN: [id number]   [facility] Progress Note    Subjective:   Reports no chest pain overnight and denies any shortness of breath.    Objective:   BP 131/58  Pulse 56  Temp(Src) 98 F (36.7 C) (Oral)  Resp 17  Ht 5\' 7"  (1.702 m)  Wt 77.45 kg (170 lb 11.9 oz)  BMI 26.74 kg/m2  SpO2 99%  Physical Exam: NWG:NFAOZHYQMVH resting in bed QIO:NGEXB RRR, blowing HSM over outflow tract Resp:CTA bilaterally, no rales/rhonchi MWU:XLKG, flat, NT, BS normal Ext:No LE edema. LUA AVF with good thrill/bruit  Labs: BMET  Lab 08/22/11 0956 08/21/11 0813  NA 136 139  K 4.8 4.0  CL 93* 91*  CO2 21 26  GLUCOSE 110* 129*  BUN 52* 35*  CREATININE 10.09* 8.00*  ALB -- --  CALCIUM 9.4 11.7*  PHOS -- --   CBC  Lab 08/24/11 0518 08/23/11 0520 08/22/11 0956 08/21/11 0813  WBC 5.3 6.5 6.0 7.7  NEUTROABS -- -- -- 2.8  HGB 11.7* 10.9* 11.9* 14.7  HCT 34.7* 31.5* 35.4* 44.9  MCV 107.8* 105.7* 107.3* 110.3*  PLT 159 149* 158 211    Medications:       . allopurinol  100 mg Oral BID  . aspirin  324 mg Oral Pre-Cath  . aspirin  81 mg Oral Daily  . atorvastatin  40 mg Oral Daily  . b complex-vitamin c-folic acid  1 tablet Oral Daily  . bivalirudin      . darbepoetin (ARANESP) injection - DIALYSIS  100 mcg Intravenous Once  . diazepam  5 mg Oral Once  . enalapril  10 mg Oral QHS  . fentaNYL      . fentaNYL      . heparin      . isosorbide mononitrate  30 mg Oral Daily  . lidocaine      . metoprolol tartrate  25 mg Oral BID  . midazolam      . nitroGLYCERIN      . pantoprazole  40 mg Oral BID  . PARoxetine  20 mg Oral Daily  . sevelamer  2,400 mg Oral TID WC  . sodium chloride  3 mL Intravenous Q12H  . Ticagrelor      . Ticagrelor  90 mg Oral BID  . DISCONTD: aspirin  325 mg Oral Daily  . DISCONTD: clopidogrel  75 mg Oral Q  breakfast  . DISCONTD: sodium chloride  3 mL Intravenous Q12H   Dialysis Orders: Center: [facility] on tthsat .  EDW 76kg HD Bath 2.0 CA, 2.0 K Time 3hr Heparin tight. Access left upper arm AVF BFR 400 DFR 800  Zemplar 4.0 mcg mcg IV/HD Epogen 0 Units IV/HD Venofer 0  Other 0   Assessment/ Plan:   1. NSTEMI with ho CAD and Atrial FIB RVR- had complicated PCI to instent restenosis of LCX stent today. Stable afterwards.  2. ESRD= usually TTS [place]. Had Sat HD here late Sun night. Will do HD in am, keep in TTS schedule. Remove 2-3kg. . 3. Metabolic bone disease -  usually on 2.0 ca bath op center ,will use 2.25ca bath and hold zemplar 4.0 mcg on Renvela and Sensipar, fu ca phos 4. Anemia = hgb=10.9 , continue ESA and monitor Tsat 5. HTN/VOL= Good BP control   Rob Tax adviser  MD [facility] [phone] pgr    [phone] cell 08/24/2011, 6:18 PM

## 2011-08-24 NOTE — CV Procedure (Signed)
[facility]     CARDIAC CATHETERIZATION REPORT  NAME:  Roger [name]     MRN: [id number] DATE OF BIRTH:  05/18/1940   ADMIT DATE:  08/21/2011  Performing Cardiologist: Marykay Lex, M.D., M.S. Primary Physician: [name], MD, MD Primary Cardiologist:  Runell Gess., MD  Procedures Performed:  Left Heart Catheterization via 5 Fr Right Femoral Artery access  Native Coronary Angiography  IC NTG Injection  Very complex, difficult multi-segment Percutaneous Coronary Artery Intervention on Proxiaml-Mid Left Cricumflex into the superior branch of the major Lateral OM1 with a 4 overlapping Integrity Resolute DES .2.25 mm x 8 mm (distal), 2.25 mm x 16 mm (at Lat OM1 bifurcation), 2.75 mm x 18 mm mid, 3.0 mm x 18 mm covering previously placed stent & extending proximally to cover the ~60% pre-stent stenosis.  final diameter: 3.4 mm proximal; 3.25 mm mid, 3.0 mm prior to OM brifurcation; 2.45 mm in the proximal superior branch and 2.3 mm at the distal stent edge.  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 male with a history of Renal Artery Stenosis - R renal stent in 2005, ESRD on HD and previous Inferior STEMI in 03/2010 with an occluded mid Left Circumflex, treated with a 3.39mm x 15 mm BMS and distal PTCA.  Also known moderate-to-severe mid LAD lesion involving the branch point into D2 which has a proximal ~95% lesion.  This was evaluated with an Out Patient Myoview that did not demonstrate ischemia, so he has remained on medical therapy. He had been feeling "poorly" over th past few weeks, but awoke Friday (08/21/11) at ~7 Am with sudden onset of chest pressure with dizziness and SOB.  Also noted palpitations, and was found to be in Afib with inferolateral ST depressions that improved somewhat after he converted to NSR after  Diltiazem.  Also RBBB pattern with Left axis deviation on ECG.   He was initiated on IV Heparin & NTG, then was transferred to [facility] after his 2nd set of Troponins returned at ~21.  He has remained stable over the weekend.  He is referred for diagnostic cardiac catheterization +/- PCI.[name]  Consent: The procedure with Risks/Benefits/Alternatives and Indications was reviewed with the patient (and family).  All questions were answered.    Risks / Complications include, but not limited to: Death, MI, CVA/TIA, VF/VT (with defibrillation), Bradycardia (need for temporary pacer placement), contrast induced nephropathy  - on HD, bleeding / bruising / hematoma / pseudoaneurysm, vascular or coronary injury (with possible emergent CT or Vascular Surgery), adverse medication reactions, infection.    The patient (and family) voice understanding and agree to proceed.     Consent for signed by MD and patient with RN witness -- placed on chart.  Procedure: The patient was brought to the 2nd Floor [facility] in the fasting state and prepped and draped in the usual sterile fashion for Right groin access. Sterile technique was used including antiseptics, cap, gloves, gown, hand hygiene, mask and sheet.  Skin prep: Chlorhexidine;  Time Out: Verified patient identification, verified procedure, site/side was marked, verified correct patient position, special equipment/implants available, medications/allergies/relevent history reviewed, required imaging and test results available.  Performed  The right femoral head was identified using tactile and fluoroscopic technique.  The right groin was anesthetized with 1% subcutaneous Lidocaine.  The right  Common Femoral Artery was accessed using the Modified Seldinger Technique with placement of a antimicrobial bonded/coated single lumen (5 Fr) sheath.  The sheath was aspirated and flushed.    A 5 Fr JL4 followed by a JR4 Catheter was advanced of  over a Standard J wire into the ascending Aorta, and used to engage the first the Left then Right Coronary Artery.  Multiple cineangiographic views of the Left then Right Coronary Artery system(s) were performed.   This catheter was then exchanged over the standard J wire for an angled Pigtail catheter that was advanced across the Aortic Valve.  LV hemodynamics were measured and Left Ventriculography was not performed as he has had an echocardiogram this admission.  LV hemodynamics were sampled, and the catheter was pulled back across the Aortic Valve for measurement of "pull-back" gradient.  The catheter was removed completely out of the body.  Hemodynamics:  Central Aortic Pressure / Mean Aortic Pressure: 136/58 mmHg; 88 mmHg  LV Pressure / LV End diastolic Pressure:  155/16 mHg; 20 mmHg  Aortic Valve Peak-Peak gradient: Avg 13.7 mmHg (to 18 mmHg)  Coronary Angiographic Data:  Left Main:  Large caliber vessel it are branches into LAD and Left Circumflex and a very small ramus intermedius the courses as a septal trunk; mildly calcified, but otherwise angiographically normal  Left Anterior Descending (LAD):  Large-caliber vessel which gives off a proximal diagonal branch followed by a second diagonal branch where the vessel tapers to a roughly 80% focal stenosis just after D2. The vessel then has diffuse luminal irregularities into the distal point where it gives off a third diagonal branch and tapers into a much smaller vessel around the apex. There is diffuse disease distally after the final bifurcation.  1st diagonal (D1):  Moderate caliber vessel that bifurcates in the mid vessel the distal portion of these branches have diffuse mild disease but otherwise minimal luminal irregularities.  2nd diagonal (D2):  Small to moderate caliber vessel with a proximal focal 95% lesion then tapered back up to normal vessel with a sharp angle along the lateral wall. The distal vessel as several small branches  and has minimal luminal irregularities  3rd diagonal (D3):  Small-caliber branching vessel that is at least the same size as the distal LAD. This is diffusely diseased.  Circumflex (LCx):  Large caliber, nondominant vessel with a 60% lesion just prior to 100% occlusion within the previous placed bare-metal stent. There is a large atrial branch that comes off where will expect a first obtuse marginal.   Following PTCA, the vessel. The terminal branch is a large lateral obtuse marginal branch that has 2 major branches. The atrioventricular groove branch originates just distal to the previously placed stent is a small calibered vessel with diffuse proximal disease. It really hasn't minimal distal flow.  Within the Circumflex stent, there was diffuse in-stent restenosis with likely some thrombosis.  This is followed by diffuse 60-70% lesions followed by 80-90% lesion just after the major ventricular groove branch and a 90% lesion at the bifurcation of the superior to inferior branches of the lateral OM.   posterior lateral branch:  Small diffusely diseased vessel at the termination of the AV Groove Circumflex .  Right Coronary Artery:  Large caliber dominant vessel with minimal luminal irregularities; this vessel terminates as a large posterior descending artery that reaches down to the apex. There is also smaller right posterior lateral system with a long small-caliber RPL 1 and a smaller RPL 2 because of  the AV nodal artery.  PERCUTANEOUS CORONARY INTERVENTION PROCEDURE  After reviewing the initial cineangiography images, the culprit lesion(s) was identified, and the decision was made to proceed with percutaneous coronary intervention.  The 5Fr Sheath was exchanged over a wire for a 6Fr sheath.   A weight based bolus of IV Angiomax was administered and the drip was continued until completion of the procedure.  An ACT of > 200 Sec was confirmed prior to advancing the Guidewire. A blood sample to  evaluate P2Y12 inhibition was sent to  Oral Ticagrelor 180 mg was administered.  The Guide catheter(s) were advanced over a J-wire and used to engage the left Coronary Artery.  After initial guiding angiography was performed, the BMW wire was advanced with extreme difficulty to the point in the distal stent was not able to pass beyond this location. The wire was redirected into appear to be different distribution and continue on beyond the stent into to be a small branch vessel. Initial balloon used was a 2.5 mm x 12 mm noncompliant balloon (balloon #1) that would not go very far distally. It was inflated at the proximal portion of the stent however not in the past down distal. This balloon was then removed in the process the wire placement was lost as the guide catheter disengaged from the Left Coronary Artery. Once the catheter was reengaged the wire was then advanced to the original location where would not pass beyond.  Then using an over-the-wire technique with a 2.0 mm 8 mm balloon (balloon #2), the wire wouldn't pass down into the lateral obtuse marginal branch initially into the inferior branch. Multiple inflations of the PDA 2.0 mm balloon were performed extending down to the bifurcation. This finally revealed TIMI 3 flow down the entire circumflex system revealing the AV groove circumflex as well as a lateral obtuse marginal with significant lesions extending down to the bifurcation of this lateral mark obtuse marginal branch. Next a 2.25 mm x 20 mm balloon was used stents at predilation was not able to pass beyond the bifurcation of the lateral obtuse marginal.  At this point that it was made pass a Promus element 2.5 mm x 20 mm stent does not able to pass through the 80-90% lesion prior to the bifurcation lesion. The stent was then removed, and a 2.25 balloon was then reinserted for the dilation distally followed by a 2.0 mm x 8 mm balloon was used distally at the bifurcation the lateral OM. The  guide wire was exchanged for a Wiggle wire. At this 0.2 0.25 mm x 16 mg element the stent was attempted cross was again unsuccessful pass beyond the bifurcation. First a 2.0 mm x 12 mm balloon with 6 [place] Guideliner catheter to allow for better guide seating was advanced. Then a 2.5 mm x 12 mm balloon then reinserted to predilate the bifurcation. Multiple inflations were made.  At this point the decision was made to switch to an Integrity Resolute DES 2.25 mm x 14 mm.  After one identified with cross beyond the bifurcation point. The stent was removed an [place] sprinter 2.25 mm 12 mm noncompliant balloon was advanced down beyond the bifurcation and inflated.  Post-balloon angioplastied angiography finally revealed resolution of the 90% bifurcation lesion. Following this a 3 overlapping drug-eluting stents were placed as described below.  On evaluation of the post stent deployment angiography a small distal edge dissection was noted beyond the distal 2.25 mm x 14 mm stent, so it was Resolute DES (2.25  mm x 8 mm) was advanced to overlap the distal stent and deployed to nominal pressures the final diameter 2.3 mm. The balloon was then used to post dilate the overlap point to roughly 2.4 mm. Final angiography with revealed excellent stent apposition with TIMI-3 flow distally with and without without wire in place.  Lesion #1:  Early mid circumflex extending to Superior Branch of major lateral  OM 1  Pre-PCI Stenosis: 100 in- restenosis% - with likely thrombosis; diffuse 60-90% lesions including the bifurcation of the lateral OM 1.  TIMI 0 flow   Post-PCI Stenosis:  0 %; TIMI  3 flow  Guide Catheter:  XB 3.5    Guidewire:  Initial wire, a BMW; final wire -- Wiggle wire  Pre-Dilitation Balloon: Emerge Monorail 2.5 mm x 12 mm; 6 Atm for 20 Sec, 4 Atm for 20 Sec  At this point was determined the wire was in the AV groove branch, not the distal OM. The wire was repositioned Scout angiography did not reveal  evidence of dissection or perforation, but no distal flow is noted.  Pre-Dilitation Balloon: Emerge Monorail 2.0 mm x 8 mm; used over-the-wire technique to advance balloon proceeded by wire into the inferior branch of the lateral OM  Multiple Inflations from within the stent to the bifurcation lateral OM: 6 Atm for 30 Sec; 8 Atm for 30 Sec x 2; 6 Atm x 40 sec; 6 Atm for 30 Sec  Pre-Dilitation Balloon: 2.25 mm x 20 mm; multiple inflations from mid circumflex/proximal OM back to proximal circumflex   Inflations: 8 Atm for 45 Sec; 10 Atm for 45 Sec; 8 Atm for 35 Sec; 8 Atm for 45 Sec; 8 Atm for 35 Sec; 6 Atm for 30 Sec - most distal to  Pre-Dilitation Balloon: Emerge Monorail 2.0 mm x 8 mm; 3 inflations at 8 Atm for 30 Sec attending to open up the bifurcation  The Guideliner catheter was advanced over this balloon  Pre-Dilitation Balloon: Emerge Monorail 2.0 mm x 12 mm; multiple inflations 4 Atm for 30 Sec, followed by 2 at 10 Atm x 45 Sec  Pre-Dilitation Balloon: Emerge Monorail 2.5 mm x 12 mm; at the lateral OM bifurcation distal to proximal  6 Atm for 30 Sec; 3 Atm for 30 Sec (most distal); 8 Atm for 30 Sec; 6 Atm for 45 Sec; 10 Atm for 45 Sec; 10 Atm for 45 Sec   Pre-Dilitation Balloon: [place] sprinter 2.25 mm x 12 mm  8Atm for 45 Sec; 10 Atm for 48 Sec; 14 Atm for 33 Sec; 14 Atm for 32 Sec Scout angiography did not reveal evidence of dissection or perforation with brisk flow down the lateral OM both branches as well as AV groove circumflex. At this point the decision made to proceed with stent placement.   Stent #1:  Integrity Resolute DES  2.25 mm x  14 mm (extending to just distal to the bifurcation to the mid OM   Deployment:  10 Atm for  30 Sec  2nd Inflation: 12 Atm for  48 Sec; final distal diameter 2.4 mm   Stent #2: Integrity Resolute DES  2.75 mm x  18 mm  Deployment: 10 Atm for  45 Sec  2nd Inflation: 12 Atm for  50 Sec  3rd Inflation:10  Atm for 50 Sec - at stent  overlap; final diameter 2.8 mm   Stent #3: Integrity Resolute DES  3.0 mm x  18 mm; covering proximal portion of the old stent into the  proximal circumflex, and overlapping stent #2 distally   Deployment:  12 Atm for  47 Sec  2nd Inflation: 14 Atm for  46 Sec; final diameter 3.4 mm  3rd Inflation:12 Atm for 44 Sec - at stent overlap; final diameter 3.2 mm   Post-Dilation Balloon: [place] sprinter 3.25 mm x 15 mm; beginning from distal stent overlap with 3 inflations ending in the proximal stent  1st Inflation: 8 Atm for 44 Sec; diameter 3.0 mm  2nd Inflation:12 Atm for 46 Sec; final diameter 3.2 mm  3rd Inflation:18 Atm for 45 Sec; final diameter 3.4 mm    Scout angiography did reveal evidence of  a small distal edge dissection in the superior branch of the lateral OM; therefore the decision was made to  Cover for DES.  Stent #4:  Integrity Resolute DES  2.25 mm x  8 mm  Deployment: 9 Atm for 30 Sec; final diameter 2.3 mm  2nd Inflation at overlap: 12 Atm for 30 Sec; at overlap - -final diameter 2.4 mm   Post-deployment cineangiography with and without guidewire in place was performed in multiple orthogonal views demonstrating excellent  stent deployment  and overlap.  Angiography did not reveal evidence of dissection or perforation, and TIMI-3 flow was preserved.   Angiomax was discontinued. The sheath was sutured in place, to be removed 2 hours after Angiomax discontinued.  The patient was transported to the PACU holding area  in q hemodynamically stable, chest pain-freecondition.   The patient  was stable before, during and following the procedure.   Patient did tolerate procedure well. There were not complications. Small displaced dissection covered with small stent, not a major complication. EBL ~70ml; with 30 mL sent for P2 Y12 inhibition assay.  Medications:  Premedication: 5mg   Valium,   Sedation:  2 mg IV Versed, 125 mcg IV Fentanyl  Contrast:  450 ml  Omnipaque  Anticoagulation: Angiomax (renal dose) bolus & gtt   Brilinta 180mg   IC NTG 200 mcg x 5  Impression:  Severe CAD involving 2 major vessel distributions -- 100% In-stent-restenosis / thrombosis with extensive calcified disease beyond the stent extending to beyond the Lateral OM1 bifurcation, 80-90% mid LAD just beyond D2 that has a proximal 95% lesion as well.  LAD lesions appear to have progressed since 03/2010.  Successful Complex / very difficult multi-segment PTCA-PCI of the Proximal Mid Left Circumflex extending to the superior branch of the Major Lateral OM1.  4 overlapping Resolute DES placed as described above.  Mild Aortic Stenosis with Average Peak-Peak gradient of 13.7 mmHg.  Plan:  Monitor overnight in Stepdown/ICU status.  Follow up P2Y12I assay results, but use Brilinta until results confirmed given ISR/thrombosis.  Will need to address LAD-D2 lesions, but would delay for a few days to allow recovery from this prolonged procedure.  Will review films with Dr. [name] in AM.  Will also need to address the issue of Atrial Fibrillation on presentation, may well have been related to MI.  With extensive DES stents placed, will need to be on DAPT for at least 1 year, especially if LAD stented.  Consider d/c on a Mobile Telemetry Monitor as OP to check for Afib burden prior to placing on triple therapy.  The case and results was discussed with the patient (and family). The case and results was not discussed with the patient's PCP - will be notified with the cath report. The case and results was discussed with the patient's Cardiologist.  Time Spend Directly with Patient:  4 hours, 15 minutes  Roger [name], M.D., M.S. [facility] [address] [place], [place]  [address]  [phone]

## 2011-08-25 ENCOUNTER — Inpatient Hospital Stay (HOSPITAL_COMMUNITY): Payer: Medicare Other

## 2011-08-25 LAB — BASIC METABOLIC PANEL
BUN: 38 mg/dL — ABNORMAL HIGH (ref 6–23)
CO2: 23 mEq/L (ref 19–32)
Calcium: 9.4 mg/dL (ref 8.4–10.5)
Creatinine, Ser: 8.65 mg/dL — ABNORMAL HIGH (ref 0.50–1.35)
Glucose, Bld: 104 mg/dL — ABNORMAL HIGH (ref 70–99)

## 2011-08-25 LAB — CBC
HCT: 32.4 % — ABNORMAL LOW (ref 39.0–52.0)
Hemoglobin: 10.9 g/dL — ABNORMAL LOW (ref 13.0–17.0)
MCH: 36.5 pg — ABNORMAL HIGH (ref 26.0–34.0)
MCV: 108.4 fL — ABNORMAL HIGH (ref 78.0–100.0)
RBC: 2.99 MIL/uL — ABNORMAL LOW (ref 4.22–5.81)

## 2011-08-25 MED FILL — Dextrose Inj 5%: INTRAVENOUS | Qty: 50 | Status: AC

## 2011-08-25 NOTE — Progress Notes (Signed)
Subjective:  Seen on HD, no current complaints, comfortable.  Objective: Vital signs in last 24 hours: Temp:  [97.8 F (36.6 C)-99.5 F (37.5 C)] 98.3 F (36.8 C) (05/14 0652) Pulse Rate:  [52-66] 59  (05/14 0700) Resp:  [8-20] 16  (05/14 0700) BP: (101-157)/(36-141) 144/57 mmHg (05/14 0700) SpO2:  [96 %-100 %] 97 % (05/14 0652) Arterial Line BP: (145-175)/(58-76) 145/60 mmHg (05/13 1800) Weight:  [79.5 kg (175 lb 4.3 oz)] 79.5 kg (175 lb 4.3 oz) (05/14 6962) Weight change: -0.55 kg (-1 lb 3.4 oz)  Intake/Output from previous day: 05/13 0701 - 05/14 0700 In: 506.4 [P.O.:210; I.V.:296.4] Out: -    EXAM: General appearance:  Alert, in no apparent distress Resp:  CTA bilaterally Cardio:  RRR with Gr II/VI systolic murmur GI:  + BS, soft and nontender Extremities:  No edema Access:  AVF @ LUA with BFR 400 cc/min  Lab Results:  Basename 08/25/11 0500 08/24/11 0518  WBC 5.3 5.3  HGB 10.9* 11.7*  HCT 32.4* 34.7*  PLT 161 159   BMET:  Basename 08/25/11 0500 08/22/11 0956  NA 127* 136  K 4.9 4.8  CL 89* 93*  CO2 23 21  GLUCOSE 104* 110*  BUN 38* 52*  CREATININE 8.65* 10.09*  CALCIUM 9.4 9.4  ALBUMIN -- --   No results found for this basename: PTH:2 in the last 72 hours Iron Studies:  Basename 08/23/11 1006  IRON 43  TIBC 215  TRANSFERRIN --  FERRITIN 256   Assessment/Plan: 1. NSTEMI with history of CAD and Atrial Fib RVR - cardiac cath yesterday with successful complex multi-segment PTCA-PCI of proximal mid left circumflex, currently stable; followed by Cardiology. 2. ESRD - HD on TTS in [place], pre-HD K 4.9 on 2K bath.  3. HTN/Volume - BP stable at 138/68, on Enalapril 10 mg qhs, Metoprolol 25 mg bid; slightly under EDW today. 4. Metabolic bone disease - Ca 9.4 on 2.25Ca bath, Zemplar held, on Renvela 3 with meals. 5. Anemia - Hgb 10.9 off Aranesp.     LOS: 4 days   LYLES,CHARLES 08/25/2011,7:27 AM  Patient seen and examined and agree with assessment  and plan as above.  Vinson Moselle  MD [facility] [phone] pgr    [phone] cell 08/25/2011, 12:54 PM

## 2011-08-25 NOTE — Progress Notes (Signed)
[facility]  Subjective: No CP, SOB or orthopnea.  Objective: Vital signs in last 24 hours: Temp:  [98 F (36.7 C)-99.5 F (37.5 C)] 98.3 F (36.8 C) (05/14 0652) Pulse Rate:  [52-67] 64  (05/14 0900) Resp:  [8-20] 17  (05/14 0800) BP: (101-145)/(36-73) 120/60 mmHg (05/14 0900) SpO2:  [96 %-100 %] 97 % (05/14 0652) Arterial Line BP: (145-175)/(58-76) 145/60 mmHg (05/13 1800) Weight:  [79.5 kg (175 lb 4.3 oz)] 79.5 kg (175 lb 4.3 oz) (05/14 0652) Last BM Date: 08/19/11  Intake/Output from previous day: 05/13 0701 - 05/14 0700 In: 506.4 [P.O.:210; I.V.:296.4] Out: -  Intake/Output this shift:    Medications Current Facility-Administered Medications  Medication Dose Route Frequency Provider Last Rate Last Dose  . 0.9 %  sodium chloride infusion  250 mL Intravenous Continuous Marykay Lex, MD      . acetaminophen (TYLENOL) tablet 650 mg  650 mg Oral Q4H PRN Marykay Lex, MD      . allopurinol (ZYLOPRIM) tablet 100 mg  100 mg Oral BID [name], MD   100 mg at 08/24/11 2105  . aluminum hydroxide (AMPHOJEL/ALTERNAGEL) suspension 30 mL  30 mL Oral Q6H PRN Dwana Melena, PA   30 mL at 08/23/11 2053  . aspirin chewable tablet 81 mg  81 mg Oral Daily Marykay Lex, MD      . atorvastatin (LIPITOR) tablet 40 mg  40 mg Oral Daily [name], MD   40 mg at 08/24/11 1549  . b complex-vitamin c-folic acid (NEPHRO-VITE) tablet 1 tablet  1 tablet Oral Daily [name], MD   1 tablet at 08/24/11 1549  . bivalirudin (ANGIOMAX) 250 MG injection           . enalapril (VASOTEC) tablet 10 mg  10 mg Oral QHS [name], MD   10 mg at 08/24/11 2105  . fentaNYL (SUBLIMAZE) 0.05 MG/ML injection           . fentaNYL (SUBLIMAZE) 0.05 MG/ML injection           . heparin 2-0.9 UNIT/ML-% infusion           . heparin injection 1,600 Units  20 Units/kg Dialysis PRN Maree Krabbe, MD   1,600 Units at 08/25/11 0650  . isosorbide mononitrate  (IMDUR) 24 hr tablet 30 mg  30 mg Oral Daily Angus Edilia Bo, MD   30 mg at 08/24/11 1549  . lidocaine (XYLOCAINE) 1 % injection           . metoprolol tartrate (LOPRESSOR) tablet 25 mg  25 mg Oral BID [name], MD   25 mg at 08/24/11 0115  . midazolam (VERSED) 2 MG/2ML injection           . morphine 4 MG/ML injection 2 mg  2 mg Intravenous Q2H PRN Marykay Lex, MD   2 mg at 08/24/11 1702  . nitroGLYCERIN (NITROSTAT) SL tablet 0.4 mg  0.4 mg Sublingual Q5 min PRN Dayton Bailiff, MD      . nitroGLYCERIN (NTG ON-CALL) 0.2 mg/mL injection           . ondansetron (ZOFRAN) injection 4 mg  4 mg Intravenous Q6H PRN Marykay Lex, MD      . pantoprazole (PROTONIX) EC tablet 40 mg  40 mg Oral BID [name], MD   40 mg at 08/24/11 2105  . PARoxetine (PAXIL) tablet 20 mg  20 mg  Oral Daily Dwana Melena, PA   20 mg at 08/24/11 1550  . sevelamer (RENVELA) tablet 2,400 mg  2,400 mg Oral TID WC Angus Edilia Bo, MD   2,400 mg at 08/24/11 1745  . sevelamer (RENVELA) tablet 2,400 mg  2,400 mg Oral Once Chrystie Nose, MD   2,400 mg at 08/24/11 1949  . sodium chloride 0.9 % injection 3 mL  3 mL Intravenous Q12H Marykay Lex, MD   3 mL at 08/24/11 2107  . sodium chloride 0.9 % injection 3 mL  3 mL Intravenous PRN Marykay Lex, MD      . Ticagrelor (BRILINTA) 90 MG tablet           . Ticagrelor (BRILINTA) tablet 90 mg  90 mg Oral BID Marykay Lex, MD      . DISCONTD: 0.9 %  sodium chloride infusion   Intravenous Continuous [name], MD 10 mL/hr at 08/22/11 2300    . DISCONTD: 0.9 %  sodium chloride infusion  250 mL Intravenous PRN Dwana Melena, PA      . DISCONTD: 0.9 %  sodium chloride infusion  1 mL/kg/hr Intravenous Continuous Marykay Lex, MD 77.5 mL/hr at 08/24/11 1426 1 mL/kg/hr at 08/24/11 1426  . DISCONTD: aspirin tablet 325 mg  325 mg Oral Daily Dwana Melena, PA   325 mg at 08/23/11 1014  . DISCONTD: atropine 1 MG/ML injection           . DISCONTD: clopidogrel (PLAVIX)  tablet 75 mg  75 mg Oral Q breakfast Governor Rooks, MD   75 mg at 08/24/11 0837  . DISCONTD: heparin ADULT infusion 100 units/mL (25000 units/250 mL)  1,000 Units/hr Intravenous Continuous Colleen Can, PHARMD 10 mL/hr at 08/23/11 1842 1,000 Units/hr at 08/23/11 1842  . DISCONTD: nitroGLYCERIN (NITROSTAT) SL tablet 0.4 mg  0.4 mg Sublingual Q5 min PRN [name], MD      . DISCONTD: nitroGLYCERIN 0.2 mg/mL in dextrose 5 % infusion  2-200 mcg/min Intravenous Titrated Dwana Melena, PA   5 mcg/min at 08/22/11 0900  . DISCONTD: sodium chloride 0.9 % injection 3 mL  3 mL Intravenous Q12H Dwana Melena, PA      . DISCONTD: sodium chloride 0.9 % injection 3 mL  3 mL Intravenous PRN Dwana Melena, PA        PE: General appearance: alert, cooperative and no distress Lungs: clear to auscultation bilaterally Heart: regular rate and rhythm, S1, S2 normal, 3/6 systolic MM.  Extremities: No LEE Pulses: DPs 2+ and symmetric. 2+ right radial Right groin:  Mildly tender to palpation.  No bruit, ecchymosis or erythema.   Lab Results:   Basename 08/25/11 0500 08/24/11 0518 08/23/11 0520  WBC 5.3 5.3 6.5  HGB 10.9* 11.7* 10.9*  HCT 32.4* 34.7* 31.5*  PLT 161 159 149*   BMET  Basename 08/25/11 0500 08/22/11 0956  NA 127* 136  K 4.9 4.8  CL 89* 93*  CO2 23 21  GLUCOSE 104* 110*  BUN 38* 52*  CREATININE 8.65* 10.09*  CALCIUM 9.4 9.4    Studies/Results: Impression:  Severe CAD involving 2 major vessel distributions -- 100% In-stent-restenosis / thrombosis with extensive calcified disease beyond the stent extending to beyond the Lateral OM1 bifurcation, 80-90% mid LAD just beyond D2 that has a proximal 95% lesion as well. LAD lesions appear to have progressed since 03/2010.  Successful Complex / very difficult multi-segment PTCA-PCI of the Proximal  Mid Left Circumflex extending to the superior branch of the Major Lateral OM1. 4 overlapping Resolute DES placed as described above.    Mild Aortic Stenosis with Average Peak-Peak gradient of 13.7 mmHg. Plan:  Monitor overnight in Stepdown/ICU status.  Follow up P2Y12I assay results, but use Brilinta until results confirmed given ISR/thrombosis.  Will need to address LAD-D2 lesions, but would delay for a few days to allow recovery from this prolonged procedure. Will review films with Dr. [name] in AM.  Will also need to address the issue of Atrial Fibrillation on presentation, may well have been related to MI. With extensive DES stents placed, will need to be on DAPT for at least 1 year, especially if LAD stented. Consider d/c on a Mobile Telemetry Monitor as OP to check for Afib burden prior to placing on triple therapy. The case and results was discussed with the patient (and family).  The case and results was not discussed with the patient's PCP - will be notified with the cath report.  The case and results was discussed with the patient's Cardiologist.  Time Spend Directly with Patient:  4 hours, 15 minutes  HARDING,DAVID W, M.D., M.S.    Assessment/Plan  Principal Problem:  *NSTEMI (non-ST elevated myocardial infarction) Active Problems:  Atrial fibrillation with rapid ventricular response  Chronic kidney failure: HD T, TH, Sat  CAD (coronary artery disease)  HTN (hypertension)  PAD (peripheral artery disease): 60% bilateral common iliac stenosis   HLD (hyperlipidemia)  Arthritis  Bradycardia  Plan:    NSTEMI.  S/P Successful Complex / very difficult multi-segment PTCA-PCI of the Proximal Mid Left Circumflex extending to the superior branch of the Major Lateral OM1. 4 overlapping Resolute DES placed as described above.   Consider staged PCI to 80-90% mid LAD.  AFIB RVR at presentation.  Converted spontaneously while io IV Diltiazem and no further episodes.   Maintaining NSR at 63 BPM.  BP 118/58 - 144/57.    LOS: 4 days    HAGER,BRYAN W 08/25/2011 9:15 AM   I have seen and examined the patient along  with HAGER,BRYAN W, PA.  I have reviewed the chart, notes and new data.  I agree with PA's note.  Key new complaints: Feels well. Tolerated dialysis without problems. Key examination changes: No groin complications Key new findings / data: No new ECG changes; "quiet ECG" confirms that LCX was culprit vessel.  PLAN: Planned staged LAD intervention later this week. On appropriate Rx w ASA/ticagrelor, beta blocker, statin, ACEi Will need to consider warfarin anticoagulation in view of AFib. Defer warfarin until PCI completed.  Thurmon Fair, MD, [facility] [phone] 08/25/2011, 3:58 PM

## 2011-08-25 NOTE — Progress Notes (Addendum)
CARDIAC REHAB PHASE I   PRE:  Rate/Rhythm: 67 SR  BP:  Supine:   Sitting: 105/40  Standing:    SaO2:   MODE:  Ambulation: 560 ft   POST:  Rate/Rhythem: 69 SR  BP:  Supine:   Sitting: 127/58  Standing:    SaO2:  1445-1430 Assisted X 1 to ambulate. Gait steady with hand held assist. VS stable. Started MI education with pt and wife. Will follow pt tomorrow. Pt was tired by end of walk, denied any cp or pressure.  Beatrix Fetters

## 2011-08-25 NOTE — Progress Notes (Signed)
   CARE MANAGEMENT NOTE 08/25/2011  Patient:  RANDELL, TEARE   Account Number:  [id number]  Date Initiated:  08/24/2011  Documentation initiated by:  Junius Creamer  Subjective/Objective Assessment:   adm w at fib w rvr     Action/Plan:   lives w wife, pcp dr Thalia Party mcginnis   Anticipated DC Date:  08/26/2011   Anticipated DC Plan:  HOME/SELF CARE      DC Planning Services  CM consult      Choice offered to / List presented to:             Status of service:   Medicare Important Message given?   (If response is "NO", the following Medicare IM given date fields will be blank) Date Medicare IM given:   Date Additional Medicare IM given:    Discharge Disposition:  HOME/SELF CARE  Per UR Regulation:  Reviewed for med. necessity/level of care/duration of stay  If discussed at Long Length of Stay Meetings, dates discussed:    Comments:  08/25/11 11:46a debbie Saskia Simerson rn,bsn pt has 45.00 copay for brilinta.will leave sticky note for md.  08/24/11 13:45p debbie Umair Rosiles rn,bsn [phone] getting cm sec to ck on copay for brilinta.

## 2011-08-25 NOTE — Progress Notes (Signed)
Pt transferred to HD on monitor in bed by HD RN and HD tech; Elink notified; hall pass signed

## 2011-08-25 NOTE — Progress Notes (Signed)
Pt requested his Renvela last night with dinner tray; according to [facility], pt has already received it at 1745 - but pt insists that he refused that dose and wished to take it WITH his dinner.  Pharmacy ordered 1 time dose for last night with his dinner tray; medication given;

## 2011-08-26 MED ORDER — TICAGRELOR 90 MG PO TABS: 90.0000 mg | ORAL_TABLET | Freq: Two times a day (BID) | ORAL | Status: DC

## 2011-08-26 MED ORDER — ASPIRIN 81 MG PO CHEW: 81.0000 mg | CHEWABLE_TABLET | Freq: Every day | ORAL | Status: DC

## 2011-08-26 MED ORDER — PAROXETINE HCL 20 MG PO TABS: 20.0000 mg | ORAL_TABLET | Freq: Every day | ORAL | Status: DC

## 2011-08-26 MED ORDER — NITROGLYCERIN 0.4 MG SL SUBL: 0.4000 mg | SUBLINGUAL_TABLET | SUBLINGUAL | Status: DC | PRN

## 2011-08-26 NOTE — Progress Notes (Signed)
Cardiac Rehab 1130-1200 Pt has walked with nursing staff this am , denies any cp or SOB.Completed discharge education with pt.and wife. He agrees to [employer]. CRP in [place], will send referral. Pt declines Heart Attack booklet, states that he has one from his last admission.

## 2011-08-26 NOTE — Progress Notes (Signed)
RN ambulated pt in the hall; walked approximately 500 ft. Pt denied any discomfort, chest pain, SOB, and dizziness.  HR remained stable in the 60's and maintained Sinus Rhythm.

## 2011-08-26 NOTE — Progress Notes (Signed)
[facility]  Subjective: No Complaints.  He would like to go home.  Objective: Vital signs in last 24 hours: Temp:  [98.1 F (36.7 C)-100 F (37.8 C)] 98.5 F (36.9 C) (05/15 0400) Pulse Rate:  [62-66] 66  (05/14 1009) Resp:  [17-19] 18  (05/14 1600) BP: (91-133)/(34-70) 127/48 mmHg (05/15 0600) SpO2:  [95 %-98 %] 95 % (05/15 0400) Weight:  [75.7 kg (166 lb 14.2 oz)] 75.7 kg (166 lb 14.2 oz) (05/14 1009) Last BM Date: 08/19/11  Intake/Output from previous day: 05/14 0701 - 05/15 0700 In: 840 [P.O.:840] Out: 3000  Intake/Output this shift:    Medications Current Facility-Administered Medications  Medication Dose Route Frequency Provider Last Rate Last Dose  . 0.9 %  sodium chloride infusion  250 mL Intravenous Continuous Marykay Lex, MD      . acetaminophen (TYLENOL) tablet 650 mg  650 mg Oral Q4H PRN Marykay Lex, MD      . allopurinol (ZYLOPRIM) tablet 100 mg  100 mg Oral BID [name], MD   100 mg at 08/25/11 2224  . aluminum hydroxide (AMPHOJEL/ALTERNAGEL) suspension 30 mL  30 mL Oral Q6H PRN Dwana Melena, PA   30 mL at 08/23/11 2053  . aspirin chewable tablet 81 mg  81 mg Oral Daily Marykay Lex, MD   81 mg at 08/25/11 1000  . atorvastatin (LIPITOR) tablet 40 mg  40 mg Oral Daily Angus Edilia Bo, MD   40 mg at 08/25/11 1000  . b complex-vitamin c-folic acid (NEPHRO-VITE) tablet 1 tablet  1 tablet Oral Daily Angus Edilia Bo, MD   1 tablet at 08/25/11 1000  . enalapril (VASOTEC) tablet 10 mg  10 mg Oral QHS [name], MD   10 mg at 08/25/11 2224  . heparin injection 1,600 Units  20 Units/kg Dialysis PRN Maree Krabbe, MD   1,600 Units at 08/25/11 0650  . isosorbide mononitrate (IMDUR) 24 hr tablet 30 mg  30 mg Oral Daily Angus Edilia Bo, MD   30 mg at 08/25/11 1000  . metoprolol tartrate (LOPRESSOR) tablet 25 mg  25 mg Oral BID [name], MD   25 mg at 08/25/11 2224  . morphine 4 MG/ML injection 2 mg  2 mg  Intravenous Q2H PRN Marykay Lex, MD   2 mg at 08/24/11 1702  . nitroGLYCERIN (NITROSTAT) SL tablet 0.4 mg  0.4 mg Sublingual Q5 min PRN Dayton Bailiff, MD      . ondansetron [facility]) injection 4 mg  4 mg Intravenous Q6H PRN Marykay Lex, MD      . pantoprazole (PROTONIX) EC tablet 40 mg  40 mg Oral BID [name], MD   40 mg at 08/25/11 2224  . PARoxetine (PAXIL) tablet 20 mg  20 mg Oral Daily Dwana Melena, PA   20 mg at 08/25/11 1000  . sevelamer (RENVELA) tablet 2,400 mg  2,400 mg Oral TID WC Angus Edilia Bo, MD   2,400 mg at 08/25/11 1700  . sodium chloride 0.9 % injection 3 mL  3 mL Intravenous Q12H Marykay Lex, MD   3 mL at 08/25/11 2200  . sodium chloride 0.9 % injection 3 mL  3 mL Intravenous PRN Marykay Lex, MD      . Ticagrelor [facility]) tablet 90 mg  90 mg Oral BID Marykay Lex, MD   90 mg at 08/25/11 2224    PE: General appearance: alert,  cooperative and no distress Lungs: clear to auscultation bilaterally Heart: regular rate and rhythm, S1, S2 normal, 3/6 systolic MM.  Extremities: No LEE  Pulses: DPs 2+ and symmetric. 2+ right radial  Right groin: Mildly tender to palpation. No bruit, hematoma, ecchymosis or erythema.  Lab Results:   Basename 08/25/11 0500 08/24/11 0518  WBC 5.3 5.3  HGB 10.9* 11.7*  HCT 32.4* 34.7*  PLT 161 159   BMET  Basename 08/25/11 0500  NA 127*  K 4.9  CL 89*  CO2 23  GLUCOSE 104*  BUN 38*  CREATININE 8.65*  CALCIUM 9.4    Assessment/Plan  Principal Problem:  *NSTEMI (non-ST elevated myocardial infarction) Active Problems:  Atrial fibrillation with rapid ventricular response  Chronic kidney failure: HD T, TH, Sat  CAD (coronary artery disease)  HTN (hypertension)  PAD (peripheral artery disease): 60% bilateral common iliac stenosis   HLD (hyperlipidemia)  Arthritis  Bradycardia  Plan: NSTEMI. S/P Successful Complex / very difficult multi-segment PTCA-PCI of the Proximal Mid Left Circumflex extending to  the superior branch of the Major Lateral OM1. 4 overlapping Resolute DES placed as described above.   Recommend Staged PCI to 80% mid LAD prior to DC.[name] AFIB RVR at presentation. Converted spontaneously while io IV Diltiazem and no further episodes. Maintaining NSR at 63 BPM. BP 91/63 - 144/57.  Hemodialysis yesterday.     LOS: 5 days    HAGER,BRYAN W 08/26/2011 8:11 AM  I have seen & examined [name] this AM & agree with Mr. Jasper Riling findings, exam & initial recommendations.   I also discussed the potential plan for discharge with Dr. [name], his primary cardiologist -- who reviewed his films. Our consensus was that the LAD does appear to be incrementally progressed since prior cath, but unlikely to have been the culprit lesion -- especially as there was no hint of Anterior AMA on Echo & no acute ECG changes.  In light of his extensive LCx PCI on Monday, and how well he is doing, it is safe to discharge him on current medical therapy with plan to do an OP ST to assess for anterior ischemia or wait for recurrence of Anginal symptoms to determine the need to proceed with PCI.   As he has had not further episodes of Afib & recent DES stents, he is on ASA/Brilinta.  I will defer starting Warfarin until the LAD lesion has been fully evaluated in the outpatient setting to avoid the necessary warfarin cessation for potential cath for PCI.  Would consider OP MCOT to assess for any Afib burden, which would help in determining the COA for moving to triple therapy. Could also consider stopping ASA in that setting.   Provided he is able to ambulate in the halls without angina or CHF & has no Afib recurrence today, I think he is stable for discharge. Would consider Phase 2 Cardiac Rehab -- after LAD lesion evaluated.   Marykay Lex, M.D., M.S. [facility] [address] [place], [place]  [address]  [phone] Pager # [phone]  08/26/2011 9:47 AM

## 2011-08-26 NOTE — Progress Notes (Signed)
Subjective:  Seen on HD, no current complaints, comfortable.  Objective: Vital signs in last 24 hours: Temp:  [98.4 F (36.9 C)-100 F (37.8 C)] 98.6 F (37 C) (05/15 1200) Resp:  [17-18] 18  (05/14 1600) BP: (91-133)/(37-63) 102/56 mmHg (05/15 1200) SpO2:  [94 %-98 %] 94 % (05/15 1200) Weight change: -3.8 kg (-8 lb 6 oz)  Intake/Output from previous day: 05/14 0701 - 05/15 0700 In: 840 [P.O.:840] Out: 3000  Total I/O In: 120 [P.O.:120] Out: -  EXAM: General appearance:  Alert, in no apparent distress Resp:  CTA bilaterally Cardio:  RRR with Gr II/VI systolic murmur GI:  + BS, soft and nontender Extremities:  No edema Access:  AVF @ LUA with BFR 400 cc/min  Lab Results:  Basename 08/25/11 0500 08/24/11 0518  WBC 5.3 5.3  HGB 10.9* 11.7*  HCT 32.4* 34.7*  PLT 161 159   BMET:   Basename 08/25/11 0500  NA 127*  K 4.9  CL 89*  CO2 23  GLUCOSE 104*  BUN 38*  CREATININE 8.65*  CALCIUM 9.4  ALBUMIN --   No results found for this basename: PTH:2 in the last 72 hours Iron Studies: No results found for this basename: IRON,TIBC,TRANSFERRIN,FERRITIN in the last 72 hours Assessment/Plan: 1. NSTEMI with history of CAD and Atrial Fib RVR- s/p successful complex multi-segment PTCA-PCI of proximal mid left circumflex, currently stable; followed by Cardiology. Possible d/c today per cardiology.  2. ESRD - HD on TTS in [place]- pre-HD K 4.9 on 2K bath.  3. HTN/Volume - BP stable at 138/68, on Enalapril 10 mg qhs, Metoprolol 25 mg bid. He is at dry wt. 4. Metabolic bone disease - Ca 9.4 on 2.25Ca bath, Zemplar held, on Renvela 3 with meals. 5. Anemia - Hgb 10.9 off Aranesp.     Vinson Moselle  MD [facility] [phone] pgr    [phone] cell 08/26/2011, 12:30 PM

## 2011-08-26 NOTE — Progress Notes (Signed)
Pt d/c'd home with wife. RN reviewed discharge instructions and medications with Pt and verbalizes understanding.

## 2011-08-26 NOTE — Progress Notes (Signed)
Physician Discharge Summary  [name] ID: Roger [name] MRN: [id number] DOB/AGE: 09/12/1940 71 y.o.  Admit date: 08/21/2011 Discharge date: 08/26/2011  Admission Diagnoses:  Afib with RVR  Discharge Diagnoses:  Principal Problem:  *NSTEMI (non-ST elevated myocardial infarction) Active Problems:  Atrial fibrillation with rapid ventricular response  Chronic kidney failure: HD T, TH, Sat  CAD (coronary artery disease)  HTN (hypertension)  PAD (peripheral artery disease): 60% bilateral common iliac stenosis   HLD (hyperlipidemia)  Arthritis  Bradycardia  Discharged Condition: stable  Hospital Course:   Roger [name] is a 71 year old white male with a history of  hypertension and progressive renal insufficiency. He had a renal angiography in 2005 with widely patent renal arteries however he has had subsequent total occlusion of his right renal stent on subsequent angiography. He discontinued smoking in 1994.  He has chronic renal failure and has been on dialysis Tuesday Thursday and Saturday in [place]. He has a left upper extremity AV fistula.  He has a history of known coronary disease and presented to [facility] with acute posterior wall myocardial infarction in December of 2012. He was found to have a totally occluded circumflex artery and he underwent stenting of this with a 3.0 her metal stent-15 mm successfully. He had associated 80-90% proximal LAD disease after Roger second diagonal and associated 80-90% stenosis of a moderate size second diagonal branch. Roger right coronary artery was dominant and had 40% mid and 50% PDA.  He had 60% bilateral common iliac stenosis that has not been symptomatic until lesion of his right renal artery on that study.  It was elected to treat him medically after a culprit lesion bare-metal stenting of his circumflex. He had an outpatient Cardiolite to assess him for ischemia on 05/02/2010. No significant ischemia was demonstrated.   He developed substernal  chest pressure this morning associated with palpitations prompting a visit to Roger emergency room. He was found to be in atrial fibrillation with rapid ventricular response an EKG showed diffuse inferolateral ST segment depression with incomplete right bundle branch block and left axis deviation he was started on IV Cardizem in Roger emergency room and admitted to Roger ICU for further care. His pain resolved spontaneously after nitroglycerin and in Roger emergency room on low-dose Cardizem drip he reverted to sinus rhythm. His Cardizem was DC'd in Roger ICU and EKG shows sinus bradycardia 53 per minute with incomplete RBBB RSR prime in V1 left axis deviation and resolution of ST segment depression with T wave inversion V1 V2. Roger [name] was pain-free and essentially asymptomatic at this time.  Roger [name] was transferred to [facility].  Second troponin was 21.24.   IV heparin and nitro were started.  Hemodialysis was arranged by renal.  2D echo was completed(see complete results below).  EF 50-55% with moderate inferior hypokinesis.  He was taken to Roger cath lab(See cath report below), which revealed severe CAD involving 2 major vessel distributions -- 100% In-stent-restenosis / thrombosis with extensive calcified disease beyond Roger stent extending to beyond Roger Lateral OM1 bifurcation, 80-90% mid LAD just beyond D2 that has a proximal 95% lesion as well. LAD lesions appear to have progressed since 03/2010.  Successful Complex / very difficult multi-segment PTCA-PCI of Roger Proximal Mid Left Circumflex extending to Roger superior branch of Roger Major Lateral OM1. 4 overlapping Resolute DES placed as described above.  Mild Aortic Stenosis with Average Peak-Peak gradient of 13.7 mmHg. He was started on ASA and brilinta. Roger [name] did very well  and had considerable improvement.  He in have a 30 cardiac monitor to assess atrial fibrillation burden.  He will also have a nuclear stress test to assess for anterior ischemia and  identify whether he will need staged PCI to Roger LAD.  Consults: Renal  Significant Diagnostic Studies:  Procedures Performed:  Left Heart Catheterization via 5 Fr Right Femoral Artery access  Native Coronary Angiography  IC NTG Injection  Very complex, difficult multi-segment Percutaneous Coronary Artery Intervention on Proxiaml-Mid Left Cricumflex into Roger superior branch of Roger major Lateral OM1 with a 4 overlapping Integrity Resolute DES .2.25 mm x 8 mm (distal), 2.25 mm x 16 mm (at Lat OM1 bifurcation), 2.75 mm x 18 mm mid, 3.0 mm x 18 mm covering previously placed stent & extending proximally to cover Roger ~60% pre-stent stenosis.  final diameter: 3.4 mm proximal; 3.25 mm mid, 3.0 mm prior to OM brifurcation; 2.45 mm in Roger proximal superior branch and 2.3 mm at Roger distal stent edge. Indication(s): NSTEMI  Known 2 Vessel CAD with prior Inferior STEMI 03/2010 - BMS to mid LCircumflex  ESRD on HD  New onset Atrial Fibrillation History: 71 y.o. male with a history of Renal Artery Stenosis - R renal stent in 2005, ESRD on HD and previous Inferior STEMI in 03/2010 with an occluded mid Left Circumflex, treated with a 3.94mm x 15 mm BMS and distal PTCA. Also known moderate-to-severe mid LAD lesion involving Roger branch point into D2 which has a proximal ~95% lesion. This was evaluated with an [name] that did not demonstrate ischemia, so he has remained on medical therapy.  He had been feeling "poorly" over th past few weeks, but awoke Friday (08/21/11) at ~7 Am with sudden onset of chest pressure with dizziness and SOB. Also noted palpitations, and was found to be in Afib with inferolateral ST depressions that improved somewhat after he converted to NSR after Diltiazem. Also RBBB pattern with Left axis deviation on ECG. He was initiated on IV Heparin & NTG, then was transferred to [facility] after his 2nd set of Troponins returned at ~21. He has remained stable over Roger weekend. He is  referred for diagnostic cardiac catheterization +/- PCI.[name]  Consent: Roger procedure with Risks/Benefits/Alternatives and Indications was reviewed with Roger [name] (and family). All questions were answered.  Risks / Complications include, but not limited to: Death, MI, CVA/TIA, VF/VT (with defibrillation), Bradycardia (need for temporary pacer placement), contrast induced nephropathy - on HD, bleeding / bruising / hematoma / pseudoaneurysm, vascular or coronary injury (with possible emergent CT or Vascular Surgery), adverse medication reactions, infection.  Roger [name] (and family) voice understanding and agree to proceed.  Consent for signed by MD and [name] with RN witness -- placed on chart.  Procedure: Roger [name] was brought to Roger 2nd Floor [facility] in Roger fasting state and prepped and draped in Roger usual sterile fashion for Right groin access. Sterile technique was used including antiseptics, cap, gloves, gown, hand hygiene, mask and sheet.  Skin prep: Chlorhexidine;  Time Out: Verified [name] identification, verified procedure, site/side was marked, verified correct [name] position, special equipment/implants available, medications/allergies/relevent history reviewed, required imaging and test results available. Performed  Roger right femoral head was identified using tactile and fluoroscopic technique. Roger right groin was anesthetized with 1% subcutaneous Lidocaine. Roger right Common Femoral Artery was accessed using Roger Modified Seldinger Technique with placement of a antimicrobial bonded/coated single lumen (5 Fr) sheath. Roger sheath was aspirated and flushed.  A 5 Fr JL4 followed by a JR4 Catheter was advanced of over a Standard J wire into Roger ascending Aorta, and used to engage Roger first Roger Left then Right Coronary Artery. Multiple cineangiographic views of Roger Left then Right Coronary Artery system(s) were performed.  This catheter was then exchanged over Roger  standard J wire for an angled Pigtail catheter that was advanced across Roger Aortic Valve. LV hemodynamics were measured and Left Ventriculography was not performed as he has had an echocardiogram this admission. LV hemodynamics were sampled, and Roger catheter was pulled back across Roger Aortic Valve for measurement of "pull-back" gradient. Roger catheter was removed completely out of Roger body.  Hemodynamics:  Central Aortic Pressure / Mean Aortic Pressure: 136/58 mmHg; 88 mmHg  LV Pressure / LV End diastolic Pressure: 155/16 mHg; 20 mmHg  Aortic Valve Peak-Peak gradient: Avg 13.7 mmHg (to 18 mmHg)  Coronary Angiographic Data:  Left Main: Large caliber vessel it are branches into LAD and Left Circumflex and a very small ramus intermedius Roger courses as a septal trunk; mildly calcified, but otherwise angiographically normal  Left Anterior Descending (LAD): Large-caliber vessel which gives off a proximal diagonal branch followed by a second diagonal branch where Roger vessel tapers to a roughly 80% focal stenosis just after D2. Roger vessel then has diffuse luminal irregularities into Roger distal point where it gives off a third diagonal branch and tapers into a much smaller vessel around Roger apex. There is diffuse disease distally after Roger final bifurcation.  1st diagonal (D1): Moderate caliber vessel that bifurcates in Roger mid vessel Roger distal portion of these branches have diffuse mild disease but otherwise minimal luminal irregularities.  2nd diagonal (D2): Small to moderate caliber vessel with a proximal focal 95% lesion then tapered back up to normal vessel with a sharp angle along Roger lateral wall. Roger distal vessel as several small branches and has minimal luminal irregularities  3rd diagonal (D3): Small-caliber branching vessel that is at least Roger same size as Roger distal LAD. This is diffusely diseased.  Circumflex (LCx): Large caliber, nondominant vessel with a 60% lesion just prior to 100% occlusion  within Roger previous placed bare-metal stent. There is a large atrial branch that comes off where will expect a first obtuse marginal.  Following PTCA, Roger vessel. Roger terminal branch is a large lateral obtuse marginal branch that has 2 major branches. Roger atrioventricular groove branch originates just distal to Roger previously placed stent is a small calibered vessel with diffuse proximal disease. It really hasn't minimal distal flow.  Within Roger Circumflex stent, there was diffuse in-stent restenosis with likely some thrombosis. This is followed by diffuse 60-70% lesions followed by 80-90% lesion just after Roger major ventricular groove branch and a 90% lesion at Roger bifurcation of Roger superior to inferior branches of Roger lateral OM.  posterior lateral branch: Small diffusely diseased vessel at Roger termination of Roger AV Groove Circumflex .  Right Coronary Artery: Large caliber dominant vessel with minimal luminal irregularities; this vessel terminates as a large posterior descending artery that reaches down to Roger apex. There is also smaller right posterior lateral system with a long small-caliber RPL 1 and a smaller RPL 2 because of Roger AV nodal artery. PERCUTANEOUS CORONARY INTERVENTION PROCEDURE  After reviewing Roger initial cineangiography images, Roger culprit lesion(s) was identified, and Roger decision was made to proceed with percutaneous coronary intervention.  Roger 5Fr Sheath was exchanged over a wire for a 6Fr sheath.  A weight based  bolus of IV Angiomax was administered and Roger drip was continued until completion of Roger procedure. An ACT of > 200 Sec was confirmed prior to advancing Roger Guidewire.  A blood sample to evaluate P2Y12 inhibition was sent to  Oral Ticagrelor 180 mg was administered.  Roger Guide catheter(s) were advanced over a J-wire and used to engage Roger left Coronary Artery.  After initial guiding angiography was performed, Roger BMW wire was advanced with extreme difficulty to Roger point  in Roger distal stent was not able to pass beyond this location. Roger wire was redirected into appear to be different distribution and continue on beyond Roger stent into to be a small branch vessel. Initial balloon used was a 2.5 mm x 12 mm noncompliant balloon (balloon #1) that would not go very far distally. It was inflated at Roger proximal portion of Roger stent however not in Roger past down distal. This balloon was then removed in Roger process Roger wire placement was lost as Roger guide catheter disengaged from Roger Left Coronary Artery. Once Roger catheter was reengaged Roger wire was then advanced to Roger original location where would not pass beyond. Then using an over-Roger-wire technique with a 2.0 mm 8 mm balloon (balloon #2), Roger wire wouldn't pass down into Roger lateral obtuse marginal branch initially into Roger inferior branch. Multiple inflations of Roger PDA 2.0 mm balloon were performed extending down to Roger bifurcation. This finally revealed TIMI 3 flow down Roger entire circumflex system revealing Roger AV groove circumflex as well as a lateral obtuse marginal with significant lesions extending down to Roger bifurcation of this lateral mark obtuse marginal branch.  Next a 2.25 mm x 20 mm balloon was used stents at predilation was not able to pass beyond Roger bifurcation of Roger lateral obtuse marginal. At this point that it was made pass a Promus element 2.5 mm x 20 mm stent does not able to pass through Roger 80-90% lesion prior to Roger bifurcation lesion. Roger stent was then removed, and a 2.25 balloon was then reinserted for Roger dilation distally followed by a 2.0 mm x 8 mm balloon was used distally at Roger bifurcation Roger lateral OM. Roger guide wire was exchanged for a Wiggle wire. At this 0.2 0.25 mm x 16 mg element Roger stent was attempted cross was again unsuccessful pass beyond Roger bifurcation. First a 2.0 mm x 12 mm balloon with 6 [place] Guideliner catheter to allow for better guide seating was advanced. Then a 2.5 mm x 12 mm  balloon then reinserted to predilate Roger bifurcation. Multiple inflations were made.  At this point Roger decision was made to switch to an Integrity Resolute DES 2.25 mm x 14 mm. After one identified with cross beyond Roger bifurcation point. Roger stent was removed an [place] sprinter 2.25 mm 12 mm noncompliant balloon was advanced down beyond Roger bifurcation and inflated. Post-balloon angioplastied angiography finally revealed resolution of Roger 90% bifurcation lesion.  Following this a 3 overlapping drug-eluting stents were placed as described below. On evaluation of Roger post stent deployment angiography a small distal edge dissection was noted beyond Roger distal 2.25 mm x 14 mm stent, so it was Resolute DES (2.25 mm x 8 mm) was advanced to overlap Roger distal stent and deployed to nominal pressures Roger final diameter 2.3 mm. Roger balloon was then used to post dilate Roger overlap point to roughly 2.4 mm. Final angiography with revealed excellent stent apposition with TIMI-3 flow distally with and without without wire in  place.  Lesion #1: Early mid circumflex extending to Superior Branch of major lateral OM 1  Pre-PCI Stenosis: 100 in- restenosis% - with likely thrombosis; diffuse 60-90% lesions including Roger bifurcation of Roger lateral OM 1. TIMI 0 flow  Post-PCI Stenosis: 0 %; TIMI 3 flow Guide Catheter: XB 3.5  Guidewire: Initial wire, a BMW; final wire -- Wiggle wire Pre-Dilitation Balloon: Emerge Monorail 2.5 mm x 12 mm; 6 Atm for 20 Sec, 4 Atm for 20 Sec  At this point was determined Roger wire was in Roger AV groove branch, not Roger distal OM. Roger wire was repositioned Scout angiography did not reveal evidence of dissection or perforation, but no distal flow is noted.  Pre-Dilitation Balloon: Emerge Monorail 2.0 mm x 8 mm; used over-Roger-wire technique to advance balloon proceeded by wire into Roger inferior branch of Roger lateral OM Multiple Inflations from within Roger stent to Roger bifurcation lateral OM: 6 Atm for 30  Sec; 8 Atm for 30 Sec x 2; 6 Atm x 40 sec; 6 Atm for 30 Sec Pre-Dilitation Balloon: 2.25 mm x 20 mm; multiple inflations from mid circumflex/proximal OM back to proximal circumflex  Inflations: 8 Atm for 45 Sec; 10 Atm for 45 Sec; 8 Atm for 35 Sec; 8 Atm for 45 Sec; 8 Atm for 35 Sec; 6 Atm for 30 Sec - most distal to  Pre-Dilitation Balloon: Emerge Monorail 2.0 mm x 8 mm; 3 inflations at 8 Atm for 30 Sec attending to open up Roger bifurcation  Roger Guideliner catheter was advanced over this balloon Pre-Dilitation Balloon: Emerge Monorail 2.0 mm x 12 mm; multiple inflations 4 Atm for 30 Sec, followed by 2 at 10 Atm x 45 Sec Pre-Dilitation Balloon: Emerge Monorail 2.5 mm x 12 mm; at Roger lateral OM bifurcation distal to proximal 6 Atm for 30 Sec; 3 Atm for 30 Sec (most distal); 8 Atm for 30 Sec; 6 Atm for 45 Sec; 10 Atm for 45 Sec; 10 Atm for 45 Sec  Pre-Dilitation Balloon: [place] sprinter 2.25 mm x 12 mm 8Atm for 45 Sec; 10 Atm for 48 Sec; 14 Atm for 33 Sec; 14 Atm for 32 Sec Scout angiography did not reveal evidence of dissection or perforation with brisk flow down Roger lateral OM both branches as well as AV groove circumflex. At this point Roger decision made to proceed with stent placement.  Stent #1: Integrity Resolute DES 2.25 mm x 14 mm (extending to just distal to Roger bifurcation to Roger mid OM  Deployment: 10 Atm for 30 Sec  2nd Inflation: 12 Atm for 48 Sec; final distal diameter 2.4 mm  Stent #2: Integrity Resolute DES 2.75 mm x 18 mm  Deployment: 10 Atm for 45 Sec  2nd Inflation: 12 Atm for 50 Sec  3rd Inflation:10 Atm for 50 Sec - at stent overlap; final diameter 2.8 mm  Stent #3: Integrity Resolute DES 3.0 mm x 18 mm; covering proximal portion of Roger old stent into Roger proximal circumflex, and overlapping stent #2 distally  Deployment: 12 Atm for 47 Sec  2nd Inflation: 14 Atm for 46 Sec; final diameter 3.4 mm 3rd Inflation:12 Atm for 44 Sec - at stent overlap; final diameter 3.2 mm    Post-Dilation Balloon: [place] sprinter 3.25 mm x 15 mm; beginning from distal stent overlap with 3 inflations ending in Roger proximal stent 1st Inflation: 8 Atm for 44 Sec; diameter 3.0 mm  2nd Inflation:12 Atm for 46 Sec; final diameter 3.2 mm  3rd Inflation:18 Atm for  45 Sec; final diameter 3.4 mm  Scout angiography did reveal evidence of a small distal edge dissection in Roger superior branch of Roger lateral OM; therefore Roger decision was made to Cover for DES.  Stent #4: Integrity Resolute DES 2.25 mm x 8 mm  Deployment: 9 Atm for 30 Sec; final diameter 2.3 mm  2nd Inflation at overlap: 12 Atm for 30 Sec; at overlap - -final diameter 2.4 mm  Post-deployment cineangiography with and without guidewire in place was performed in multiple orthogonal views demonstrating excellent stent deployment and overlap. Angiography did not reveal evidence of dissection or perforation, and TIMI-3 flow was preserved.  Angiomax was discontinued. Roger sheath was sutured in place, to be removed 2 hours after Angiomax discontinued.  Roger [name] was transported to Roger PACU holding area in q hemodynamically stable, chest pain-freecondition.  Roger [name] was stable before, during and following Roger procedure.  [name] did tolerate procedure well.  There were not complications. Small displaced dissection covered with small stent, not a major complication.  EBL ~33ml; with 30 mL sent for P2 Y12 inhibition assay.  Medications:  Premedication: 5mg  Valium,  Sedation: 2 mg IV Versed, 125 mcg IV Fentanyl  Contrast: 450 ml Omnipaque  Anticoagulation: Angiomax (renal dose) bolus & gtt  Brilinta 180mg   IC NTG 200 mcg x 5  Impression:  Severe CAD involving 2 major vessel distributions -- 100% In-stent-restenosis / thrombosis with extensive calcified disease beyond Roger stent extending to beyond Roger Lateral OM1 bifurcation, 80-90% mid LAD just beyond D2 that has a proximal 95% lesion as well. LAD lesions appear to have progressed since  03/2010.  Successful Complex / very difficult multi-segment PTCA-PCI of Roger Proximal Mid Left Circumflex extending to Roger superior branch of Roger Major Lateral OM1. 4 overlapping Resolute DES placed as described above.  Mild Aortic Stenosis with Average Peak-Peak gradient of 13.7 mmHg. Plan:  Monitor overnight in Stepdown/ICU status.  Follow up P2Y12I assay results, but use Brilinta until results confirmed given ISR/thrombosis.  Will need to address LAD-D2 lesions, but would delay for a few days to allow recovery from this prolonged procedure. Will review films with Dr. [name] in AM.  Will also need to address Roger issue of Atrial Fibrillation on presentation, may well have been related to MI. With extensive DES stents placed, will need to be on DAPT for at least 1 year, especially if LAD stented. Consider d/c on a Mobile Telemetry Monitor as OP to check for Afib burden prior to placing on triple therapy. Roger case and results was discussed with Roger [name] (and family).  Roger case and results was not discussed with Roger [name]'s PCP - will be notified with Roger cath report.  Roger case and results was discussed with Roger [name]'s Cardiologist.  Time Spend Directly with [name]:  4 hours, 15 minutes  Chevella Pearce W, M.D., M.S.  [facility]  [address]  [place], [place] [address]  [phone]  __________________________________________________________________________________  2D Echo Study Conclusions  - Left ventricle: Roger cavity size was normal. Wall thickness was increased in a pattern of moderate LVH. Systolic function was low normal. Roger estimated ejection fraction was in Roger range of 50% to 55%. Moderate inferior hypokinesis. Doppler parameters are consistent withpseudonormal left ventricular relaxation (grade2 diastolic dysfunction). Roger E/A ratio is 1.7 Roger E/e' ratio is >15 suggesting markedly elevated LV filling pressure. - Aortic valve:  Moderately calcified with restricted leaflet motion. Peak and mean gradients across Roger valve are  41 mmHg and 25 mmHg, respectively. Based on an LVOT diameter of 2.0 cm, Roger calculated AVA is 1.0-1.1 cm2. Trivial regurgitation. Valve area: 1.09cm^2(VTI). Valve area: 0.98cm^2 (Vmax). - Left atrium: Severely dilated (41 ml/m2) - Right ventricle: Roger moderator band was prominent. Systolic function was normal. - Atrial septum: There was increased thickness of Roger septum, consistent with lipomatous hypertrophy. - Pulmonic valve: Trivial regurgitation. - Inferior vena cava: Roger vessel was normal in size; Roger respirophasic diameter changes were in Roger normal range (= 50%); findings are consistent with normal central venous pressure.    Treatments: IV Diltiazem.  See cath report.  Discharge Exam: Blood pressure 102/56, pulse 66, temperature 98.6 F (37 C), temperature source Oral, resp. rate 18, height 5\' 7"  (1.702 m), weight 75.7 kg (166 lb 14.2 oz), SpO2 94.00%.   Disposition: 01-Home or Self Care  Discharge Orders    Future Orders Please Complete By Expires   Amb Referral to Cardiac Rehabilitation      Comments:   Pt has LAD lesion that need to be evaluated before getting started in Outpt. CRP. If stress test is poistive he will need PCI.   Diet - low sodium heart healthy      Increase activity slowly      Discharge instructions      Comments:   Take it easy and relax for Roger next three weeks.     Medication List  As of 08/26/2011  3:09 PM   STOP taking these medications         aspirin 325 MG tablet      clopidogrel 75 MG tablet         TAKE these medications         allopurinol 100 MG tablet   Commonly known as: ZYLOPRIM   Take 100 mg by mouth 2 (two) times daily.      aspirin 81 MG chewable tablet   Chew 1 tablet (81 mg total) by mouth daily.      atorvastatin 40 MG tablet   Commonly known as: LIPITOR   Take 40 mg by mouth daily.      enalapril 10 MG tablet     Commonly known as: VASOTEC   Take 10 mg by mouth at bedtime.      isosorbide mononitrate 30 MG 24 hr tablet   Commonly known as: IMDUR   Take 30 mg by mouth daily.      metoprolol tartrate 25 MG tablet   Commonly known as: LOPRESSOR   Take 25 mg by mouth 2 (two) times daily.      multivitamin Tabs tablet   Take 1 tablet by mouth daily.      nitroGLYCERIN 0.4 MG SL tablet   Commonly known as: NITROSTAT   Place 1 tablet (0.4 mg total) under Roger tongue every 5 (five) minutes as needed.      pantoprazole 40 MG tablet   Commonly known as: PROTONIX   Take 40 mg by mouth 2 (two) times daily.      PARoxetine 20 MG tablet   Commonly known as: PAXIL   Take 1 tablet (20 mg total) by mouth daily.      sevelamer 800 MG tablet   Commonly known as: RENVELA   Take 2,400 mg by mouth 3 (three) times daily with meals.      TAGAMET PO   Take 1 tablet by mouth daily as needed. Acid Reflux      Ticagrelor 90 MG Tabs tablet  Commonly known as: BRILINTA   Take 1 tablet (90 mg total) by mouth 2 (two) times daily.           Follow-up Information    Follow up with Runell Gess, MD. (Our office will call you with Roger appointment times.)    Contact information:   [address] [place] [address] [phone]          Signed: Wilburt Finlay 08/26/2011, 3:09 PM  Roger [name] was doing well after his PCI.  Ambulating in Roger hall without any angina.   He has not had any further Afib.  He does have Roger LAD & Diagonal disease that will be evaluated with OP Myoview as it had not progressed significantly from his previous catheterization.  He is stable for d/c with plan to follow-up with Dr. [name] as OP.  Marykay Lex, M.D., M.S. [facility] [address] [place], [place]  [address]  [phone] Pager # [phone]  08/27/2011 5:29 PM

## 2011-09-30 ENCOUNTER — Encounter (HOSPITAL_COMMUNITY): Payer: Self-pay | Admitting: *Deleted

## 2011-09-30 ENCOUNTER — Inpatient Hospital Stay (HOSPITAL_COMMUNITY)
Admission: EM | Admit: 2011-09-30 | Discharge: 2011-10-03 | DRG: 246 | Disposition: A | Discharge status: Home / Self Care | Payer: Medicare Other | Attending: Cardiology | Admitting: Cardiology

## 2011-09-30 ENCOUNTER — Other Ambulatory Visit: Payer: Self-pay

## 2011-09-30 DIAGNOSIS — I4891 Unspecified atrial fibrillation: Principal | ICD-10-CM

## 2011-09-30 DIAGNOSIS — E785 Hyperlipidemia, unspecified: Secondary | ICD-10-CM | POA: Diagnosis present

## 2011-09-30 DIAGNOSIS — N2581 Secondary hyperparathyroidism of renal origin: Secondary | ICD-10-CM | POA: Diagnosis present

## 2011-09-30 DIAGNOSIS — Z955 Presence of coronary angioplasty implant and graft: Secondary | ICD-10-CM

## 2011-09-30 DIAGNOSIS — Z7982 Long term (current) use of aspirin: Secondary | ICD-10-CM

## 2011-09-30 DIAGNOSIS — E7800 Pure hypercholesterolemia, unspecified: Secondary | ICD-10-CM | POA: Diagnosis present

## 2011-09-30 DIAGNOSIS — Z9861 Coronary angioplasty status: Secondary | ICD-10-CM

## 2011-09-30 DIAGNOSIS — Z79899 Other long term (current) drug therapy: Secondary | ICD-10-CM

## 2011-09-30 DIAGNOSIS — Z87891 Personal history of nicotine dependence: Secondary | ICD-10-CM

## 2011-09-30 DIAGNOSIS — I120 Hypertensive chronic kidney disease with stage 5 chronic kidney disease or end stage renal disease: Secondary | ICD-10-CM | POA: Diagnosis present

## 2011-09-30 DIAGNOSIS — I359 Nonrheumatic aortic valve disorder, unspecified: Secondary | ICD-10-CM | POA: Diagnosis present

## 2011-09-30 DIAGNOSIS — I498 Other specified cardiac arrhythmias: Secondary | ICD-10-CM | POA: Diagnosis not present

## 2011-09-30 DIAGNOSIS — N189 Chronic kidney disease, unspecified: Secondary | ICD-10-CM | POA: Diagnosis present

## 2011-09-30 DIAGNOSIS — R001 Bradycardia, unspecified: Secondary | ICD-10-CM | POA: Diagnosis present

## 2011-09-30 DIAGNOSIS — I214 Non-ST elevation (NSTEMI) myocardial infarction: Secondary | ICD-10-CM | POA: Diagnosis present

## 2011-09-30 DIAGNOSIS — I739 Peripheral vascular disease, unspecified: Secondary | ICD-10-CM | POA: Diagnosis present

## 2011-09-30 DIAGNOSIS — I10 Essential (primary) hypertension: Secondary | ICD-10-CM | POA: Diagnosis present

## 2011-09-30 DIAGNOSIS — K219 Gastro-esophageal reflux disease without esophagitis: Secondary | ICD-10-CM | POA: Diagnosis present

## 2011-09-30 DIAGNOSIS — I252 Old myocardial infarction: Secondary | ICD-10-CM

## 2011-09-30 DIAGNOSIS — Z992 Dependence on renal dialysis: Secondary | ICD-10-CM

## 2011-09-30 DIAGNOSIS — D631 Anemia in chronic kidney disease: Secondary | ICD-10-CM | POA: Diagnosis present

## 2011-09-30 DIAGNOSIS — I480 Paroxysmal atrial fibrillation: Secondary | ICD-10-CM | POA: Diagnosis present

## 2011-09-30 DIAGNOSIS — Z7901 Long term (current) use of anticoagulants: Secondary | ICD-10-CM

## 2011-09-30 DIAGNOSIS — Z8249 Family history of ischemic heart disease and other diseases of the circulatory system: Secondary | ICD-10-CM

## 2011-09-30 DIAGNOSIS — N186 End stage renal disease: Secondary | ICD-10-CM | POA: Diagnosis present

## 2011-09-30 HISTORY — DX: Presence of coronary angioplasty implant and graft: Z95.5

## 2011-09-30 HISTORY — DX: Gout, unspecified: M10.9

## 2011-09-30 LAB — DIFFERENTIAL
Eosinophils Absolute: 0.3 10*3/uL (ref 0.0–0.7)
Lymphocytes Relative: 45 % (ref 12–46)
Lymphs Abs: 2.3 10*3/uL (ref 0.7–4.0)
Monocytes Relative: 9 % (ref 3–12)
Neutro Abs: 1.9 10*3/uL (ref 1.7–7.7)
Neutrophils Relative %: 38 % — ABNORMAL LOW (ref 43–77)

## 2011-09-30 LAB — CBC
Hemoglobin: 11 g/dL — ABNORMAL LOW (ref 13.0–17.0)
MCH: 35.8 pg — ABNORMAL HIGH (ref 26.0–34.0)
Platelets: 195 10*3/uL (ref 150–400)
RBC: 3.07 MIL/uL — ABNORMAL LOW (ref 4.22–5.81)
WBC: 5.1 10*3/uL (ref 4.0–10.5)

## 2011-09-30 LAB — APTT: aPTT: 33 seconds (ref 24–37)

## 2011-09-30 LAB — COMPREHENSIVE METABOLIC PANEL
ALT: 11 U/L (ref 0–53)
Alkaline Phosphatase: 104 U/L (ref 39–117)
BUN: 36 mg/dL — ABNORMAL HIGH (ref 6–23)
CO2: 21 mEq/L (ref 19–32)
Chloride: 95 mEq/L — ABNORMAL LOW (ref 96–112)
GFR calc Af Amer: 6 mL/min — ABNORMAL LOW (ref >90)
Glucose, Bld: 122 mg/dL — ABNORMAL HIGH (ref 70–99)
Potassium: 4.2 mEq/L (ref 3.5–5.1)
Sodium: 138 mEq/L (ref 135–145)
Total Bilirubin: 0.1 mg/dL — ABNORMAL LOW (ref 0.3–1.2)
Total Protein: 7.9 g/dL (ref 6.0–8.3)

## 2011-09-30 LAB — POCT I-STAT TROPONIN I: Troponin i, poc: 0.04 ng/mL (ref 0.00–0.08)

## 2011-09-30 MED ORDER — DILTIAZEM HCL 25 MG/5ML IV SOLN
10.0000 mg | Freq: Once | INTRAVENOUS | Status: AC
  Administered 2011-09-30: 10 mg via INTRAVENOUS
  Filled 2011-09-30: qty 5

## 2011-09-30 MED ORDER — DILTIAZEM HCL 100 MG IV SOLR
5.0000 mg/h | INTRAVENOUS | Status: DC
  Administered 2011-09-30: 5 mg/h via INTRAVENOUS
  Filled 2011-09-30: qty 100

## 2011-09-30 NOTE — ED Provider Notes (Cosign Needed)
History   This chart was scribed for Ward Givens, MD by Charolett Bumpers . The patient was seen in room APA19/APA19.    CSN: [id number]  Arrival date & time 09/30/11  2213   First MD Initiated Contact with Patient 09/30/11 2238      Chief Complaint  Patient presents with  . Tachycardia    (Consider location/radiation/quality/duration/timing/severity/associated sxs/prior treatment) HPI Roger [name] is a 71 y.o. male who presents to the Emergency Department complaining of constant, moderate tachycardia since PTA tonight. Patient states that he was relaxing when the tachycardia started about 7 pm. Heart rate in ED is 143. Patient denies any pain, SOB, light-headedness or diaphoresis. Patient describes his tachycardia as a fullness/pressure up both sides of chest. Patient states that he has a h/o HTN, MI, A-fib and Mild aortic stenosis. Patient states that he has had a Holter heart monitor on for 30 days and has an appointment to take it off in a couple of days. Patient states that he had a MI in the first of May/2013 and one in 2011 and had A-fib while he was hospitalized which is why he has the heart monitor. Patient states that he had several stents placed during hospitalization. Patient states that tonight is the first time since his hospitalization in May that he has felt his tachycardia. Patient denies any smoking or alcohol use. Patient states that he is a dialysis patient on Tues, Thurs and Saturdays for the past 4 years due to kidney failure. Patient states that he currently retired. Patient reports a family h/o heart problems.   Cardiologist: Dr. [name] PCP: Dr. [facility]  Past Medical History  Diagnosis Date  . HTN (hypertension)   . Arthritis   . Hypercholesteremia   . Kidney failure     secondary to HTN  . MI (myocardial infarction)   . GERD (gastroesophageal reflux disease)   . Atrial fibrillation     Past Surgical History  Procedure Date  . Ureteral  stent placement   . Colonoscopy 04/2009    with EGD by Dr.Buccini moderately large hiatal hernia could contribute to his anemia,Schatzki ring which might accout for his intermittent dysphagia/+internal hemorrhoids ;family hx of colon ca  . Esophagogastroduodenoscopy 03/2010    by Dr.Magod/small hiatal hernia with widley patent fibrous ring,minimal bulbitis otherwise nl  . Dialysis fistula creation     History reviewed. No pertinent family history.  History  Substance Use Topics  . Smoking status: Never Smoker   . Smokeless tobacco: Not on file  . Alcohol Use: No  lives at home Retired Lives with spouse   Review of Systems  Constitutional: Negative for diaphoresis.  Respiratory: Negative for shortness of breath.   Cardiovascular: Positive for palpitations. Negative for chest pain.  Neurological: Negative for light-headedness.  All other systems reviewed and are negative.    Allergies  Review of patient's allergies indicates no known allergies.  Home Medications   Current Outpatient Rx  Name Route Sig Dispense Refill  . ALLOPURINOL 100 MG PO TABS Oral Take 100 mg by mouth 2 (two) times daily.     . ASPIRIN 81 MG PO CHEW Oral Chew 81 mg by mouth every morning.    . ASPIRIN-SALICYLAMIDE-CAFFEINE 325-95-16 MG PO TABS Oral Take 1 packet by mouth as needed.    . ENALAPRIL MALEATE 10 MG PO TABS Oral Take 10 mg by mouth at bedtime.     . ISOSORBIDE MONONITRATE ER 30 MG PO TB24 Oral Take  30 mg by mouth every morning.     [name] METOPROLOL TARTRATE 25 MG PO TABS Oral Take 25 mg by mouth 2 (two) times daily.    [name] RENA-VITE PO TABS Oral Take 1 tablet by mouth every morning.     [name] NITROGLYCERIN 0.4 MG SL SUBL Sublingual Place 1 tablet (0.4 mg total) under the tongue every 5 (five) minutes as needed. 25 tablet 5  . PANTOPRAZOLE SODIUM 40 MG PO TBEC Oral Take 40 mg by mouth 2 (two) times daily.     [name] ROSUVASTATIN CALCIUM 20 MG PO TABS Oral Take 20 mg by mouth every evening.    [name] SEVELAMER  CARBONATE 800 MG PO TABS Oral Take 2,400 mg by mouth 3 (three) times daily with meals.    [name] TICAGRELOR 90 MG PO TABS Oral Take 1 tablet (90 mg total) by mouth 2 (two) times daily. 60 tablet 10  . PAROXETINE HCL 20 MG PO TABS Oral Take 1 tablet (20 mg total) by mouth daily.      BP 121/79  Pulse 143  Temp 98.6 F (37 C) (Oral)  Resp 20  Ht 5\' 9"  (1.753 m)  Wt 170 lb (77.111 kg)  BMI 25.10 kg/m2  SpO2 98%  Vital signs normal except tachycardia   Physical Exam  Nursing note and vitals reviewed. Constitutional: He is oriented to person, place, and time. He appears well-developed and well-nourished.  Non-toxic appearance. He does not appear ill. No distress.  HENT:  Head: Normocephalic and atraumatic.  Right Ear: External ear normal.  Left Ear: External ear normal.  Nose: Nose normal. No mucosal edema or rhinorrhea.  Mouth/Throat: Oropharynx is clear and moist and mucous membranes are normal. No dental abscesses or uvula swelling.  Eyes: Conjunctivae and EOM are normal. Pupils are equal, round, and reactive to light.  Neck: Normal range of motion and full passive range of motion without pain. Neck supple. No tracheal deviation present.  Cardiovascular: Intact distal pulses.  An irregular rhythm present. Tachycardia present.  Exam reveals no gallop and no friction rub.   Murmur heard.  Systolic murmur is present  Pulmonary/Chest: Effort normal and breath sounds normal. No respiratory distress. He has no wheezes. He has no rhonchi. He has no rales. He exhibits no tenderness and no crepitus.  Abdominal: Soft. Normal appearance and bowel sounds are normal. He exhibits no distension. There is no tenderness. There is no rebound and no guarding.  Musculoskeletal: Normal range of motion. He exhibits no edema and no tenderness.       Moves all extremities well. Pt has a shunt in his LUE  Neurological: He is alert and oriented to person, place, and time. He has normal strength. No cranial nerve  deficit or sensory deficit. Coordination normal.  Skin: Skin is warm, dry and intact. No rash noted. No erythema. No pallor.  Psychiatric: He has a normal mood and affect. His speech is normal and behavior is normal. His mood appears not anxious.    ED Course  Procedures (including critical care time)   Medications  rosuvastatin (CRESTOR) 20 MG tablet (not administered)  Aspirin-Salicylamide-Caffeine (BC HEADACHE) 325-95-16 MG TABS (not administered)  aspirin 81 MG chewable tablet (not administered)  diltiazem (CARDIZEM) 100 mg in dextrose 5 % 100 mL infusion (5 mg/hr Intravenous New Bag/Given 09/30/11 2326)  diltiazem (CARDIZEM) injection 10 mg (10 mg Intravenous Given 09/30/11 2318)   Pt had brief episode of hypotension  DIAGNOSTIC STUDIES: Oxygen Saturation is 98% on room air,  normal by my interpretation.    COORDINATION OF CARE:  2302: Discussed planned course of treatment with the patient, who is agreeable at this time.   Aug 21, 2011 Procedures Performed: by Dr Herbie Baltimore Left Heart Catheterization via 5 Fr Right Femoral Artery access  Native Coronary Angiography  IC NTG Injection  Very complex, difficult multi-segment Percutaneous Coronary Artery Intervention on Proxiaml-Mid Left Cricumflex into the superior branch of the major Lateral OM1 with a 4 overlapping Integrity Resolute DES .2.25 mm x 8 mm (distal), 2.25 mm x 16 mm (at Lat OM1 bifurcation), 2.75 mm x 18 mm mid, 3.0 mm x 18 mm covering previously placed stent & extending proximally to cover the ~60% pre-stent stenosis.  final diameter: 3.4 mm proximal; 3.25 mm mid, 3.0 mm prior to OM brifurcation; 2.45 mm in the proximal superior branch and 2.3 mm at the distal stent edge. 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   01:04 When I went to check  on his latest BP to tell Dr Herbie Baltimore, patient had converted to NSR and states he feels better. BP 106 systolic. Dr Herbie Baltimore states to admit overnight to observation and have Dr [name] see in hospital in am.   01:35 Dr Selena Batten agrees to admission  Results for orders placed during the hospital encounter of 09/30/11  CBC      Component Value Range   WBC 5.1  4.0 - 10.5 K/uL   RBC 3.07 (*) 4.22 - 5.81 MIL/uL   Hemoglobin 11.0 (*) 13.0 - 17.0 g/dL   HCT 09.8 (*) 11.9 - 14.7 %   MCV 107.5 (*) 78.0 - 100.0 fL   MCH 35.8 (*) 26.0 - 34.0 pg   MCHC 33.3  30.0 - 36.0 g/dL   RDW 82.9  56.2 - 13.0 %   Platelets 195  150 - 400 K/uL  DIFFERENTIAL      Component Value Range   Neutrophils Relative 38 (*) 43 - 77 %   Neutro Abs 1.9  1.7 - 7.7 K/uL   Lymphocytes Relative 45  12 - 46 %   Lymphs Abs 2.3  0.7 - 4.0 K/uL   Monocytes Relative 9  3 - 12 %   Monocytes Absolute 0.5  0.1 - 1.0 K/uL   Eosinophils Relative 7 (*) 0 - 5 %   Eosinophils Absolute 0.3  0.0 - 0.7 K/uL   Basophils Relative 1  0 - 1 %   Basophils Absolute 0.1  0.0 - 0.1 K/uL  COMPREHENSIVE METABOLIC PANEL      Component Value Range   Sodium 138  135 - 145 mEq/L   Potassium 4.2  3.5 - 5.1 mEq/L   Chloride 95 (*) 96 - 112 mEq/L   CO2 21  19 - 32 mEq/L   Glucose, Bld 122 (*) 70 - 99 mg/dL   BUN 36 (*) 6 - 23 mg/dL   Creatinine, Ser 8.65 (*) 0.50 - 1.35 mg/dL   Calcium 78.4 (*) 8.4 - 10.5 mg/dL   Total Protein 7.9  6.0 - 8.3 g/dL   Albumin 4.0  3.5 - 5.2 g/dL   AST 11  0 - 37 U/L   ALT 11  0 - 53 U/L   Alkaline Phosphatase 104  39 - 117 U/L   Total Bilirubin 0.1 (*) 0.3 -  1.2 mg/dL   GFR calc non Af Amer 5 (*) >90 mL/min   GFR calc Af Amer 6 (*) >90 mL/min  APTT      Component Value Range   aPTT 33  24 - 37 seconds  PROTIME-INR      Component Value Range   Prothrombin Time 14.4  11.6 - 15.2 seconds   INR 1.10  0.00 - 1.49  POCT I-STAT TROPONIN I      Component Value Range   Troponin i, poc 0.04  0.00 - 0.08 ng/mL   Comment 3             Laboratory interpretation all normal except renal failure, mild anemia  .  Date: 09/30/2011  Rate: 142  Rhythm: atrial fibrillation  QRS Axis: normal  Intervals: normal  ST/T Wave abnormalities: nonspecific ST changes  Conduction Disutrbances:none  Narrative Interpretation:   Old EKG Reviewed: changes noted from 08/24/2011 was in NSR HR 58   01:28  Date: 10/01/2011  Rate: 59  Rhythm: normal sinus rhythm  QRS Axis: normal  Intervals: normal  ST/T Wave abnormalities: nonspecific T wave changes  Conduction Disutrbances:none  Narrative Interpretation:   Old EKG Reviewed: changes noted was in afib with RVR      1. Atrial fibrillation with rapid ventricular response     Plan admission to observation, Dr [name] to consult in am  MDM  I personally performed the services described in this documentation, which was scribed in my presence. The recorded information has been reviewed and considered.  Devoria Albe, MD, Armando Gang        Ward Givens, MD 10/01/11 [phone]

## 2011-09-30 NOTE — ED Notes (Signed)
Heart rate ranging between 125-135. cardizem drip increased to 10 mg\hr (10 ml\hr).

## 2011-09-30 NOTE — ED Notes (Addendum)
cardizem drip started at 5 mg\hr (5 cc\hr). Heart rate 124 bpm, atrial fib.

## 2011-09-30 NOTE — ED Notes (Addendum)
Resting comfortably. Call bell within reach. Denies needs.

## 2011-09-30 NOTE — ED Notes (Addendum)
Attempting second IV access. Denies pain. Denies any needs at this time. Call bell within reach. No distress. No shortness of breath.

## 2011-09-30 NOTE — ED Notes (Addendum)
Pt says he was not feeling  Well, checked his bp and noticed that his pulse was 140.  No sob.  Pressure in chest  Wearing a holtor monitor.

## 2011-09-30 NOTE — ED Notes (Signed)
MD at bedside. 

## 2011-09-30 NOTE — ED Notes (Signed)
Heart rate 140. Alert and oriented x 4. Call bell within reach.  Wife at bedside. Equal chest rise and fall, regular, unlabored. Call bell within reach. Denies needs. No distress. s1 and s2 present. Clear lung sounds in all fields. States he is feeling a fluttering in his chest. Started at 1900 this evening.

## 2011-10-01 ENCOUNTER — Encounter (HOSPITAL_COMMUNITY)
Admission: EM | Disposition: A | Discharge status: Home / Self Care | Payer: Self-pay | Source: Home / Self Care | Attending: Cardiology

## 2011-10-01 ENCOUNTER — Encounter (HOSPITAL_COMMUNITY): Payer: Self-pay | Admitting: Internal Medicine

## 2011-10-01 DIAGNOSIS — I4891 Unspecified atrial fibrillation: Secondary | ICD-10-CM

## 2011-10-01 DIAGNOSIS — R112 Nausea with vomiting, unspecified: Secondary | ICD-10-CM

## 2011-10-01 DIAGNOSIS — K219 Gastro-esophageal reflux disease without esophagitis: Secondary | ICD-10-CM | POA: Diagnosis present

## 2011-10-01 DIAGNOSIS — I2510 Atherosclerotic heart disease of native coronary artery without angina pectoris: Secondary | ICD-10-CM

## 2011-10-01 DIAGNOSIS — N186 End stage renal disease: Secondary | ICD-10-CM

## 2011-10-01 HISTORY — PX: LEFT HEART CATHETERIZATION WITH CORONARY ANGIOGRAM: SHX5451

## 2011-10-01 LAB — CARDIAC PANEL(CRET KIN+CKTOT+MB+TROPI)
CK, MB: 5.3 ng/mL — ABNORMAL HIGH (ref 0.3–4.0)
Relative Index: INVALID (ref 0.0–2.5)
Total CK: 97 U/L (ref 7–232)
Total CK: 96 U/L (ref 7–232)
Troponin I: 0.78 ng/mL (ref <0.30)

## 2011-10-01 LAB — COMPREHENSIVE METABOLIC PANEL
BUN: 37 mg/dL — ABNORMAL HIGH (ref 6–23)
CO2: 23 mEq/L (ref 19–32)
Calcium: 10 mg/dL (ref 8.4–10.5)
Creatinine, Ser: 9.17 mg/dL — ABNORMAL HIGH (ref 0.50–1.35)
GFR calc Af Amer: 6 mL/min — ABNORMAL LOW (ref >90)
GFR calc non Af Amer: 5 mL/min — ABNORMAL LOW (ref >90)
Glucose, Bld: 84 mg/dL (ref 70–99)
Total Protein: 6.7 g/dL (ref 6.0–8.3)

## 2011-10-01 LAB — CBC
Hemoglobin: 10.5 g/dL — ABNORMAL LOW (ref 13.0–17.0)
MCH: 36.2 pg — ABNORMAL HIGH (ref 26.0–34.0)
MCHC: 33.1 g/dL (ref 30.0–36.0)
MCV: 109.3 fL — ABNORMAL HIGH (ref 78.0–100.0)
RBC: 2.9 MIL/uL — ABNORMAL LOW (ref 4.22–5.81)

## 2011-10-01 LAB — MRSA PCR SCREENING: MRSA by PCR: NEGATIVE

## 2011-10-01 SURGERY — LEFT HEART CATHETERIZATION WITH CORONARY ANGIOGRAM
Anesthesia: LOCAL

## 2011-10-01 MED ORDER — PENTAFLUOROPROP-TETRAFLUOROETH EX AERO: 1.0000 "application " | INHALATION_SPRAY | CUTANEOUS | Status: DC | PRN

## 2011-10-01 MED ORDER — METOPROLOL TARTRATE 25 MG PO TABS
25.0000 mg | ORAL_TABLET | Freq: Two times a day (BID) | ORAL | Status: DC
  Administered 2011-10-01 (×2): 25 mg via ORAL
  Filled 2011-10-01 (×2): qty 1

## 2011-10-01 MED ORDER — ALPRAZOLAM 0.25 MG PO TABS
0.2500 mg | ORAL_TABLET | Freq: Three times a day (TID) | ORAL | Status: DC | PRN
  Administered 2011-10-01: 0.25 mg via ORAL
  Filled 2011-10-01: qty 1

## 2011-10-01 MED ORDER — ATROPINE SULFATE 1 MG/ML IJ SOLN
INTRAMUSCULAR | Status: AC
  Filled 2011-10-01: qty 1

## 2011-10-01 MED ORDER — DIAZEPAM 5 MG PO TABS
5.0000 mg | ORAL_TABLET | ORAL | Status: AC
  Administered 2011-10-01: 5 mg via ORAL
  Filled 2011-10-01: qty 1

## 2011-10-01 MED ORDER — CALCIUM CARBONATE 1250 MG/5ML PO SUSP
500.0000 mg | Freq: Four times a day (QID) | ORAL | Status: DC | PRN
  Filled 2011-10-01: qty 5

## 2011-10-01 MED ORDER — NITROGLYCERIN 0.2 MG/ML ON CALL CATH LAB
INTRAVENOUS | Status: AC
  Filled 2011-10-01: qty 1

## 2011-10-01 MED ORDER — SODIUM CHLORIDE 0.9 % IV SOLN: 250.0000 mL | INTRAVENOUS | Status: DC

## 2011-10-01 MED ORDER — ACETAMINOPHEN 650 MG RE SUPP: 650.0000 mg | Freq: Four times a day (QID) | RECTAL | Status: DC | PRN

## 2011-10-01 MED ORDER — PARICALCITOL 5 MCG/ML IV SOLN
6.0000 ug | INTRAVENOUS | Status: DC
  Filled 2011-10-01: qty 1.2

## 2011-10-01 MED ORDER — NITROGLYCERIN 0.4 MG SL SUBL: 0.4000 mg | SUBLINGUAL_TABLET | SUBLINGUAL | Status: DC | PRN

## 2011-10-01 MED ORDER — LIDOCAINE-PRILOCAINE 2.5-2.5 % EX CREA
1.0000 "application " | TOPICAL_CREAM | CUTANEOUS | Status: DC | PRN
  Filled 2011-10-01: qty 5

## 2011-10-01 MED ORDER — PANTOPRAZOLE SODIUM 40 MG PO TBEC
40.0000 mg | DELAYED_RELEASE_TABLET | Freq: Two times a day (BID) | ORAL | Status: DC
  Administered 2011-10-01 – 2011-10-02 (×5): 40 mg via ORAL
  Filled 2011-10-01 (×5): qty 1

## 2011-10-01 MED ORDER — HYDROXYZINE HCL 25 MG PO TABS: 25.0000 mg | ORAL_TABLET | Freq: Three times a day (TID) | ORAL | Status: DC | PRN

## 2011-10-01 MED ORDER — DILTIAZEM HCL 30 MG PO TABS
30.0000 mg | ORAL_TABLET | Freq: Three times a day (TID) | ORAL | Status: DC
  Filled 2011-10-01 (×3): qty 1

## 2011-10-01 MED ORDER — ONDANSETRON HCL 4 MG PO TABS: 4.0000 mg | ORAL_TABLET | Freq: Four times a day (QID) | ORAL | Status: DC | PRN

## 2011-10-01 MED ORDER — BIVALIRUDIN 250 MG IV SOLR
INTRAVENOUS | Status: AC
  Filled 2011-10-01: qty 250

## 2011-10-01 MED ORDER — SODIUM CHLORIDE 0.9 % IJ SOLN
3.0000 mL | Freq: Two times a day (BID) | INTRAMUSCULAR | Status: DC
  Administered 2011-10-02 (×2): 3 mL via INTRAVENOUS

## 2011-10-01 MED ORDER — ATORVASTATIN CALCIUM 40 MG PO TABS
40.0000 mg | ORAL_TABLET | Freq: Every day | ORAL | Status: DC
  Administered 2011-10-02: 40 mg via ORAL
  Filled 2011-10-01 (×3): qty 1

## 2011-10-01 MED ORDER — PAROXETINE HCL 20 MG PO TABS
20.0000 mg | ORAL_TABLET | Freq: Every day | ORAL | Status: DC
  Administered 2011-10-01 – 2011-10-02 (×2): 20 mg via ORAL
  Filled 2011-10-01 (×3): qty 1

## 2011-10-01 MED ORDER — SODIUM CHLORIDE 0.9 % IJ SOLN
3.0000 mL | Freq: Two times a day (BID) | INTRAMUSCULAR | Status: DC
  Administered 2011-10-01 – 2011-10-02 (×5): 3 mL via INTRAVENOUS
  Filled 2011-10-01: qty 6
  Filled 2011-10-01: qty 3

## 2011-10-01 MED ORDER — SODIUM CHLORIDE 0.9 % IJ SOLN: 3.0000 mL | Freq: Two times a day (BID) | INTRAMUSCULAR | Status: DC

## 2011-10-01 MED ORDER — ISOSORBIDE MONONITRATE ER 30 MG PO TB24
30.0000 mg | ORAL_TABLET | Freq: Every day | ORAL | Status: DC
  Administered 2011-10-01 – 2011-10-02 (×2): 30 mg via ORAL
  Filled 2011-10-01 (×3): qty 1

## 2011-10-01 MED ORDER — CAMPHOR-MENTHOL 0.5-0.5 % EX LOTN
1.0000 "application " | TOPICAL_LOTION | Freq: Three times a day (TID) | CUTANEOUS | Status: DC | PRN
  Filled 2011-10-01: qty 222

## 2011-10-01 MED ORDER — ACETAMINOPHEN 325 MG PO TABS: 650.0000 mg | ORAL_TABLET | ORAL | Status: DC | PRN

## 2011-10-01 MED ORDER — ZOLPIDEM TARTRATE 5 MG PO TABS
5.0000 mg | ORAL_TABLET | Freq: Every evening | ORAL | Status: DC | PRN
  Administered 2011-10-01 – 2011-10-02 (×2): 5 mg via ORAL
  Filled 2011-10-01 (×2): qty 1

## 2011-10-01 MED ORDER — FENTANYL CITRATE 0.05 MG/ML IJ SOLN
INTRAMUSCULAR | Status: AC
  Filled 2011-10-01: qty 2

## 2011-10-01 MED ORDER — AMIODARONE HCL 200 MG PO TABS: 400.0000 mg | ORAL_TABLET | Freq: Two times a day (BID) | ORAL | Status: DC

## 2011-10-01 MED ORDER — SEVELAMER CARBONATE 800 MG PO TABS
2400.0000 mg | ORAL_TABLET | Freq: Three times a day (TID) | ORAL | Status: DC
  Administered 2011-10-01 – 2011-10-03 (×4): 2400 mg via ORAL
  Filled 2011-10-01 (×8): qty 3
  Filled 2011-10-01: qty 4

## 2011-10-01 MED ORDER — ONDANSETRON HCL 4 MG/2ML IJ SOLN: 4.0000 mg | Freq: Four times a day (QID) | INTRAMUSCULAR | Status: DC | PRN

## 2011-10-01 MED ORDER — DOCUSATE SODIUM 283 MG RE ENEM
1.0000 | ENEMA | RECTAL | Status: DC | PRN
  Filled 2011-10-01: qty 1

## 2011-10-01 MED ORDER — SODIUM CHLORIDE 0.9 % IJ SOLN: 3.0000 mL | INTRAMUSCULAR | Status: DC | PRN

## 2011-10-01 MED ORDER — RENA-VITE PO TABS
1.0000 | ORAL_TABLET | Freq: Every day | ORAL | Status: DC
  Administered 2011-10-01 – 2011-10-02 (×2): 1 via ORAL
  Filled 2011-10-01 (×5): qty 1

## 2011-10-01 MED ORDER — AMIODARONE HCL 200 MG PO TABS
400.0000 mg | ORAL_TABLET | Freq: Two times a day (BID) | ORAL | Status: DC
  Administered 2011-10-01 – 2011-10-02 (×3): 400 mg via ORAL
  Filled 2011-10-01 (×5): qty 2

## 2011-10-01 MED ORDER — WARFARIN VIDEO
Freq: Once | Status: AC
  Administered 2011-10-02: 11:00:00

## 2011-10-01 MED ORDER — SODIUM CHLORIDE 0.9 % IV SOLN: 100.0000 mL | INTRAVENOUS | Status: DC | PRN

## 2011-10-01 MED ORDER — HEPARIN (PORCINE) IN NACL 2-0.9 UNIT/ML-% IJ SOLN
INTRAMUSCULAR | Status: AC
  Filled 2011-10-01: qty 1000

## 2011-10-01 MED ORDER — SORBITOL 70 % SOLN
30.0000 mL | Status: DC | PRN
  Filled 2011-10-01: qty 30

## 2011-10-01 MED ORDER — ASPIRIN 81 MG PO CHEW: 81.0000 mg | CHEWABLE_TABLET | ORAL | Status: DC

## 2011-10-01 MED ORDER — ALLOPURINOL 100 MG PO TABS
100.0000 mg | ORAL_TABLET | Freq: Two times a day (BID) | ORAL | Status: DC
  Administered 2011-10-01 – 2011-10-02 (×4): 100 mg via ORAL
  Filled 2011-10-01 (×6): qty 1

## 2011-10-01 MED ORDER — SODIUM CHLORIDE 0.9 % IV SOLN
62.5000 mg | Freq: Once | INTRAVENOUS | Status: AC
  Administered 2011-10-02: 62.5 mg via INTRAVENOUS
  Filled 2011-10-01 (×2): qty 5

## 2011-10-01 MED ORDER — WARFARIN - PHARMACIST DOSING INPATIENT
Freq: Every day | Status: DC
  Administered 2011-10-02: 18:00:00

## 2011-10-01 MED ORDER — METOPROLOL TARTRATE 25 MG PO TABS
25.0000 mg | ORAL_TABLET | Freq: Two times a day (BID) | ORAL | Status: DC
  Administered 2011-10-01 – 2011-10-02 (×3): 25 mg via ORAL
  Filled 2011-10-01 (×5): qty 1

## 2011-10-01 MED ORDER — LIDOCAINE HCL (PF) 1 % IJ SOLN
INTRAMUSCULAR | Status: AC
  Filled 2011-10-01: qty 30

## 2011-10-01 MED ORDER — SODIUM CHLORIDE 0.9 % IV SOLN: 62.5000 mg | INTRAVENOUS | Status: DC

## 2011-10-01 MED ORDER — ENALAPRIL MALEATE 10 MG PO TABS
10.0000 mg | ORAL_TABLET | Freq: Every day | ORAL | Status: DC
  Filled 2011-10-01: qty 1

## 2011-10-01 MED ORDER — PATIENT'S GUIDE TO USING COUMADIN BOOK
Freq: Once | Status: AC
  Administered 2011-10-01: 22:00:00
  Filled 2011-10-01 (×2): qty 1

## 2011-10-01 MED ORDER — NEPRO/CARBSTEADY PO LIQD
237.0000 mL | Freq: Three times a day (TID) | ORAL | Status: DC | PRN
  Filled 2011-10-01: qty 237

## 2011-10-01 MED ORDER — SODIUM CHLORIDE 0.9 % IV SOLN: 250.0000 mL | INTRAVENOUS | Status: DC | PRN

## 2011-10-01 MED ORDER — LIDOCAINE HCL (PF) 1 % IJ SOLN
5.0000 mL | INTRAMUSCULAR | Status: DC | PRN
  Filled 2011-10-01 (×2): qty 5

## 2011-10-01 MED ORDER — ACETAMINOPHEN 325 MG PO TABS
650.0000 mg | ORAL_TABLET | Freq: Four times a day (QID) | ORAL | Status: DC | PRN
  Administered 2011-10-02: 650 mg via ORAL

## 2011-10-01 MED ORDER — TRAMADOL HCL 50 MG PO TABS
50.0000 mg | ORAL_TABLET | Freq: Four times a day (QID) | ORAL | Status: DC | PRN
  Filled 2011-10-01: qty 1

## 2011-10-01 MED ORDER — HEPARIN SODIUM (PORCINE) 1000 UNIT/ML DIALYSIS
1000.0000 [IU] | INTRAMUSCULAR | Status: DC | PRN
  Filled 2011-10-01: qty 1

## 2011-10-01 MED ORDER — ALTEPLASE 2 MG IJ SOLR
2.0000 mg | Freq: Once | INTRAMUSCULAR | Status: AC | PRN
  Filled 2011-10-01: qty 2

## 2011-10-01 MED ORDER — ASPIRIN 81 MG PO CHEW
81.0000 mg | CHEWABLE_TABLET | Freq: Every day | ORAL | Status: DC
  Administered 2011-10-01 – 2011-10-02 (×2): 81 mg via ORAL
  Filled 2011-10-01 (×2): qty 1

## 2011-10-01 MED ORDER — MIDAZOLAM HCL 2 MG/2ML IJ SOLN
INTRAMUSCULAR | Status: AC
  Filled 2011-10-01: qty 2

## 2011-10-01 MED ORDER — SODIUM CHLORIDE 0.9 % IV SOLN
1.0000 mL/kg/h | INTRAVENOUS | Status: DC
  Administered 2011-10-01: 1 mL/kg/h via INTRAVENOUS

## 2011-10-01 MED ORDER — WARFARIN SODIUM 5 MG PO TABS
5.0000 mg | ORAL_TABLET | Freq: Once | ORAL | Status: AC
  Administered 2011-10-02: 5 mg via ORAL
  Filled 2011-10-01: qty 1

## 2011-10-01 MED ORDER — TICAGRELOR 90 MG PO TABS
90.0000 mg | ORAL_TABLET | Freq: Two times a day (BID) | ORAL | Status: DC
  Administered 2011-10-01 – 2011-10-02 (×4): 90 mg via ORAL
  Filled 2011-10-01 (×8): qty 1

## 2011-10-01 MED ORDER — DARBEPOETIN ALFA-POLYSORBATE 25 MCG/0.42ML IJ SOLN
12.5000 ug | Freq: Once | INTRAMUSCULAR | Status: AC
  Administered 2011-10-02: 12.5 ug via INTRAVENOUS
  Filled 2011-10-01: qty 0.42

## 2011-10-01 NOTE — Consult Note (Signed)
[facility] Renal Consultation Note  Indication for Consultation:  Management of ESRD/hemodialysis; anemia, hypertension/volume and secondary hyperparathyroidism  HPI: Roger [name] is a 71 y.o. white male with ESRD on chronic HD support every TTS in [place], [place]. who presented to [facility] yesterday following complaints chest pain, palpitations since around 7pm the night prior. He was found to be in Afib with RVR, placed on a cardizem drip and converted to NSR. However, with elevated troponins over night and possible ACS, he was transferred to [facility] due to the need for urgent heart cath. Of note, he was recently admitted [facility] New onset Afib 5/10-5/15/13. PMH also HTN, HLD, Gout, SHPT, Anemia, Remote smoker (quit 1994 after 36 yrs), Rheumatoid arthritis, and Depression.  Renal consult has been requested due to the need for ongoing RRT. He is currently undergoing cardiac cath and pending results, we will plan HD tomorrow off schedule unless waranted sooner pending results of his cardiac cath.    Dialysis Orders: Center: [facility]) in [place]. EDW 76.5kg HD Bath 2K/2.25Ca Time 3:15 Heparin 2,000. Access LUA AVF (placed 12/13/03) BFR 450 DFR A2.0 (800)    Zemplar IV/HD Epogen None Units IV/HD  Venofer  50mg  qwk (Thu) Other   Past Medical History  Diagnosis Date  . HTN (hypertension)   . Arthritis   . Hypercholesteremia   . Kidney failure     secondary to HTN  . MI (myocardial infarction)   . GERD (gastroesophageal reflux disease)   . Atrial fibrillation   . Gout    Past Surgical History  Procedure Date  . Ureteral stent placement   . Colonoscopy 04/2009    with EGD by Dr.Buccini moderately large hiatal hernia could contribute to his anemia,Schatzki ring which might accout for his intermittent dysphagia/+internal hemorrhoids ;family hx of colon ca  . Esophagogastroduodenoscopy 03/2010    by Dr.Magod/small hiatal hernia with  widley patent fibrous ring,minimal bulbitis otherwise nl  . Dialysis fistula creation   . Coronary angioplasty with stent placement 08/2011    Dr. Herbie Baltimore  . Coronary angioplasty with stent placement 2011    [name]   Family History  Problem Relation Age of Onset  . Coronary artery disease Father   . Hypertension Mother   . Coronary artery disease Mother     reports that he quit smoking about 19 years ago. His smoking use included Cigarettes. He has a 22.5 pack-year smoking history. He does not have any smokeless tobacco history on file. He reports that he does not drink alcohol or use illicit drugs. No Known Allergies Prior to Admission medications   Medication Sig Start Date End Date Taking? Authorizing Provider  allopurinol (ZYLOPRIM) 100 MG tablet Take 100 mg by mouth 2 (two) times daily.    Yes Historical Provider, MD  aspirin 81 MG chewable tablet Chew 81 mg by mouth every morning. 08/26/11 08/25/12 Yes Wilburt Finlay, PA  Aspirin-Salicylamide-Caffeine (BC HEADACHE) 325-95-16 MG TABS Take 1 packet by mouth as needed.   Yes Historical Provider, MD  enalapril (VASOTEC) 10 MG tablet Take 10 mg by mouth at bedtime.    Yes Historical Provider, MD  isosorbide mononitrate (IMDUR) 30 MG 24 hr tablet Take 30 mg by mouth every morning.    Yes Historical Provider, MD  metoprolol tartrate (LOPRESSOR) 25 MG tablet Take 25 mg by mouth 2 (two) times daily.   Yes Historical Provider, MD  multivitamin (RENA-VIT) TABS tablet Take 1 tablet by mouth every  morning.    Yes Historical Provider, MD  nitroGLYCERIN (NITROSTAT) 0.4 MG SL tablet Place 1 tablet (0.4 mg total) under the tongue every 5 (five) minutes as needed. 08/26/11  Yes Wilburt Finlay, PA  pantoprazole (PROTONIX) 40 MG tablet Take 40 mg by mouth 2 (two) times daily.    Yes Historical Provider, MD  rosuvastatin (CRESTOR) 20 MG tablet Take 20 mg by mouth every evening.   Yes Historical Provider, MD  sevelamer (RENVELA) 800 MG tablet Take 2,400 mg  by mouth 3 (three) times daily with meals.   Yes Historical Provider, MD  Ticagrelor (BRILINTA) 90 MG TABS tablet Take 1 tablet (90 mg total) by mouth 2 (two) times daily. 08/26/11  Yes Wilburt Finlay, PA  PARoxetine (PAXIL) 20 MG tablet Take 1 tablet (20 mg total) by mouth daily. 08/26/11 08/25/12  Wilburt Finlay, PA   I have reviewed the patient's current medications. Results for orders placed during the hospital encounter of 09/30/11 (from the past 48 hour(s))  CBC     Status: Abnormal   Collection Time   09/30/11 10:56 PM      Component Value Range Comment   WBC 5.1  4.0 - 10.5 K/uL    RBC 3.07 (*) 4.22 - 5.81 MIL/uL    Hemoglobin 11.0 (*) 13.0 - 17.0 g/dL    HCT 16.1 (*) 09.6 - 52.0 %    MCV 107.5 (*) 78.0 - 100.0 fL    MCH 35.8 (*) 26.0 - 34.0 pg    MCHC 33.3  30.0 - 36.0 g/dL    RDW 04.5  40.9 - 81.1 %    Platelets 195  150 - 400 K/uL   DIFFERENTIAL     Status: Abnormal   Collection Time   09/30/11 10:56 PM      Component Value Range Comment   Neutrophils Relative 38 (*) 43 - 77 %    Neutro Abs 1.9  1.7 - 7.7 K/uL    Lymphocytes Relative 45  12 - 46 %    Lymphs Abs 2.3  0.7 - 4.0 K/uL    Monocytes Relative 9  3 - 12 %    Monocytes Absolute 0.5  0.1 - 1.0 K/uL    Eosinophils Relative 7 (*) 0 - 5 %    Eosinophils Absolute 0.3  0.0 - 0.7 K/uL    Basophils Relative 1  0 - 1 %    Basophils Absolute 0.1  0.0 - 0.1 K/uL   COMPREHENSIVE METABOLIC PANEL     Status: Abnormal   Collection Time   09/30/11 10:56 PM      Component Value Range Comment   Sodium 138  135 - 145 mEq/L    Potassium 4.2  3.5 - 5.1 mEq/L    Chloride 95 (*) 96 - 112 mEq/L    CO2 21  19 - 32 mEq/L    Glucose, Bld 122 (*) 70 - 99 mg/dL    BUN 36 (*) 6 - 23 mg/dL    Creatinine, Ser 9.14 (*) 0.50 - 1.35 mg/dL    Calcium 78.2 (*) 8.4 - 10.5 mg/dL    Total Protein 7.9  6.0 - 8.3 g/dL    Albumin 4.0  3.5 - 5.2 g/dL    AST 11  0 - 37 U/L    ALT 11  0 - 53 U/L    Alkaline Phosphatase 104  39 - 117 U/L    Total Bilirubin  0.1 (*) 0.3 - 1.2 mg/dL    GFR  calc non Af Amer 5 (*) >90 mL/min    GFR calc Af Amer 6 (*) >90 mL/min   APTT     Status: Normal   Collection Time   09/30/11 10:56 PM      Component Value Range Comment   aPTT 33  24 - 37 seconds   PROTIME-INR     Status: Normal   Collection Time   09/30/11 10:56 PM      Component Value Range Comment   Prothrombin Time 14.4  11.6 - 15.2 seconds    INR 1.10  0.00 - 1.49   POCT I-STAT TROPONIN I     Status: Normal   Collection Time   09/30/11 11:33 PM      Component Value Range Comment   Troponin i, poc 0.04  0.00 - 0.08 ng/mL    Comment 3            MRSA PCR SCREENING     Status: Normal   Collection Time   10/01/11  3:27 AM      Component Value Range Comment   MRSA by PCR NEGATIVE  NEGATIVE   CBC     Status: Abnormal   Collection Time   10/01/11  3:31 AM      Component Value Range Comment   WBC 4.8  4.0 - 10.5 K/uL    RBC 2.90 (*) 4.22 - 5.81 MIL/uL    Hemoglobin 10.5 (*) 13.0 - 17.0 g/dL    HCT 40.9 (*) 81.1 - 52.0 %    MCV 109.3 (*) 78.0 - 100.0 fL    MCH 36.2 (*) 26.0 - 34.0 pg    MCHC 33.1  30.0 - 36.0 g/dL    RDW 91.4  78.2 - 95.6 %    Platelets 185  150 - 400 K/uL   COMPREHENSIVE METABOLIC PANEL     Status: Abnormal   Collection Time   10/01/11  3:31 AM      Component Value Range Comment   Sodium 140  135 - 145 mEq/L    Potassium 4.3  3.5 - 5.1 mEq/L    Chloride 99  96 - 112 mEq/L    CO2 23  19 - 32 mEq/L    Glucose, Bld 84  70 - 99 mg/dL    BUN 37 (*) 6 - 23 mg/dL    Creatinine, Ser 2.13 (*) 0.50 - 1.35 mg/dL    Calcium 08.6  8.4 - 10.5 mg/dL    Total Protein 6.7  6.0 - 8.3 g/dL    Albumin 3.7  3.5 - 5.2 g/dL    AST 10  0 - 37 U/L    ALT 9  0 - 53 U/L    Alkaline Phosphatase 88  39 - 117 U/L    Total Bilirubin 0.1 (*) 0.3 - 1.2 mg/dL    GFR calc non Af Amer 5 (*) >90 mL/min    GFR calc Af Amer 6 (*) >90 mL/min   CARDIAC PANEL(CRET KIN+CKTOT+MB+TROPI)     Status: Abnormal   Collection Time   10/01/11  3:31 AM      Component  Value Range Comment   Total CK 97  7 - 232 U/L    CK, MB 5.3 (*) 0.3 - 4.0 ng/mL    Troponin I 0.78 (*) <0.30 ng/mL    Relative Index RELATIVE INDEX IS INVALID  0.0 - 2.5   TSH     Status: Normal   Collection Time   10/01/11  3:31  AM      Component Value Range Comment   TSH 1.306  0.350 - 4.500 uIU/mL   CARDIAC PANEL(CRET KIN+CKTOT+MB+TROPI)     Status: Abnormal   Collection Time   10/01/11  8:00 AM      Component Value Range Comment   Total CK 96  7 - 232 U/L    CK, MB 6.7 (*) 0.3 - 4.0 ng/mL    Troponin I 1.45 (*) <0.30 ng/mL    Relative Index RELATIVE INDEX IS INVALID  0.0 - 2.5    EKG/Telemetry: SB/SR on monitor.  ROS: currently all systems negative including c/p, SOB, palpitations, N/V, abd pain.   Physical Exam: Filed Vitals:   10/01/11 1500  BP: 130/62  Pulse: 57  Temp:   Resp: 17     General: comfortable HEENT: atraumatic, normocephalic Eyes: sclera nonicteric Neck: supple Heart: RRR, 3/6 SEM Lungs: no adventitious B.S. Abdomen: soft, NT/ND, +BS Extremities: no ankle edema Skin: intact Neuro: alert, OX3; grossly intact Dialysis Access: LUA AVF +T/B  Assessment/Plan: 1. Chest pain/Afib w/RVR/Palpitations- converted with cardizem drip and now resolved; previously on Imdur, Plavix, ASA, Imdur, NTG sl prn; cardiac cath in progress; defer to cardiology 2. ESRD -  TTS [facility]; plan HD off schedule tomorrow no heparinunless warranted sooner s/p heart cath 3 Hypertension/volume  - controlled with Lopressor, Enalapril, and UF with HD in the outpt setting; await cath results and cardiology recommendations 4. Anemia  - Hgb 10.5; ESA on hold since 09/17/11; T-Sat 30% on 09/03/11; Ferritin 467 on 08/06/11; s/p iron bolus (Venofer 100mg  qtx x 5 then 50mg  qwk); resume low dose and follow  5. Metabolic bone disease -ca 10.0, check renal panel pre-HD and continue outpt binders-Renvela, Nephrovite, and Zemplar;  (iPTH 427.9 on 09/03/11; phos 7.5 on 5/23); follow 6. Nutrition -  albumin 3.7; previously 4.5 on 09/03/11; ^ protein renal diet when taking po; follow  7. Depression- on Paxil 8. HLD- on Crestor (allergic Lipitor-myalgia) 9. Gout- on Allopurinol 10. Disposition- per primary services  Samuel Germany, FNP-C [facility] Pager [phone] 10/01/2011, 4:33 PM   Attending Nephrologist: Dr. Delano Metz  Patient seen and examined and agree with assessment and plan as above.  Vinson Moselle  MD [employer] [phone] pgr    [phone] cell 10/01/2011, 10:08 PM

## 2011-10-01 NOTE — ED Notes (Signed)
Heart rate down to 56 normal sinus. Cardizem decreased to 5 mg\hr (5 cc\hr). Denies chest pain. No distress. Call bell within reach. Equal chest rise and fall, regular, unlabored.

## 2011-10-01 NOTE — ED Notes (Signed)
Cardizem gtt, stopped due to HR.

## 2011-10-01 NOTE — Progress Notes (Signed)
Telephone report given to [facility] staff, ambulance en route.

## 2011-10-01 NOTE — ED Notes (Signed)
Hospitalist at bedside with this patient evaluating. No distress. Equal chest rise and fall, regular, unlabored. Call bell within reach. Wife at bedside. Denies needs.

## 2011-10-01 NOTE — Progress Notes (Signed)
Cardia cath sheath removal note Site area: R femoral Site prior to removal: LEVEL 0 Manual Pressure applied for 35 minutes Minutes beginning at: 2010 Pt remained stable throughout entire sheath pull and after Post sheath removal site: LEVEL 0 Post cath instructions given Post pulses present +2 Pressure dressing applied Paul Half, RN

## 2011-10-01 NOTE — Progress Notes (Signed)
UR chart review completed.  

## 2011-10-01 NOTE — Progress Notes (Signed)
CRITICAL VALUE ALERT  Critical value received:  Troponin-0.78  Date of notification:  10/01/11  Time of notification:  0535  Critical value read back:yes  Nurse who received alert:  Marcos Eke, RN  MD notified (1st page):  Dr. Selena Batten  Time of first page:  [phone]  MD notified (2nd page):  Time of second page:  Responding MD:  Dr. Selena Batten  Time MD responded:  06:35   Documented called back time for S. Ward, RN

## 2011-10-01 NOTE — ED Notes (Signed)
Remains resting in bed on back. Eyes closed. No distress. Heart rate 65 bpm, A.fib. No distress. Equal chest rise and fall, regular, unlabored. Will continue to monitor.

## 2011-10-01 NOTE — Progress Notes (Signed)
NAME:  Roger [name], Roger [name]                  ACCOUNT NO.:  [id number]  MEDICAL RECORD NO.:  [id number]  LOCATION:  2914                         FACILITY:  [facility]  PHYSICIAN:  Zanovia Rotz G. Renard Matter, MD   DATE OF BIRTH:  November 30, 1940  DATE OF PROCEDURE: DATE OF DISCHARGE:                                PROGRESS NOTE   This patient was admitted to the ICU last evening with atrial fibrillation with rapid ventricular response.  He was placed on Cardizem drip which slowed his rate and rhythm.  This morning he was more alert, was not complaining of chest pain or dyspnea.  He is on chronic dialysis for chronic renal failure as well.  His cardiac markers were normal on admission.  But as the day progressed, his troponin went from 0.78 to 1.45.  OBJECTIVE:  VITAL SIGNS:  Blood pressure 131/66, respirations 16, pulse 61, temp 98.2. LUNGS:  Clear to P and A. HEART:  Regular rhythm. ABDOMEN:  No palpable organs or masses.  ASSESSMENT:  The patient was admitted with palpitations and atrial fib. He was started on Cardizem drip and converted to normal sinus rhythm. He does have a prior history of coronary artery disease with previous stent placement.  His troponins became progressively worse.  Discussed the patient's problems with Dr. [name] of [facility], who recommended he be transferred to [facility]. Arrangements were made for this to occur.  The patient was stable at the time of his transfer.     Ayaana Biondo G. Renard Matter, MD     AGM/MEDQ  D:  10/01/2011  T:  10/01/2011  Job:  [id number]

## 2011-10-01 NOTE — CV Procedure (Signed)
[facility] PERCUTANEOUS CORONARY INTERVENTION REPORT  NAME:  Roger [name]   MRN: [id number] DOB:  11-Nov-1940   ADMIT DATE: 09/30/2011  INTERVENTIONAL CARDIOLOGIST: Marykay Lex, M.D., MS PRIMARY CARE PROVIDER: [name], MD PRIMARY CARDIOLOGIST: Runell Gess MD  PATIENT:  Roger [name] is a 71 y.o. male with a history of CAD, s/p STEMI 12/11 rx'd with CFX BMS. He had some residual LAD disease then but Myoview 1/12 was low risk. He presented May 2013 with NSTEMI and had DES to CFX/OM. The plan was to have an OP Myoview to access LAD. Yesterday he felt "great". He did some yard work without problems. Yesterday evening he developed abd and chest discomfort. He took antacids and NTG without relief. He took his B/P at home and noted his HR was elevated. He has had PAF in the past. He went to [facility] ER and is noted to be in rapid AF that converted with IV Diltiazem with resolution of his chest pain. His Troponin is rising, 1.45 now. He was transferred to [facility] with the plan to proceed with Coronary Angiography & planned PCI of LAD/D2 lesions as the Troponin elevation is likely due to Type 2 MI - supply vs. Demand ischemia, suggesting that he failed his stress test.  PROCEDURES PERFORMED:    Native Coronary Angiography via 6 Fr Right Common Femoral Artery access  IC NTG Injection 200 mcg x 3  Percutaneous Coronary Artery Intervention on mid LAD ~80% lesion with an Integrity Resolute DES 2.75 mm x 14 mm; final diameter 2.9 mm distal and 3.75mm proximal  Percutaneous Coronary Balloon Angioplasty of proximal Diagonal 2 with an Sprinter Legend 2.0 mm x 15 mm - final diameter 2.15 mm   PRE-OPERATIVE DIAGNOSIS:    NSTEMI with existing CAD in setting of Afib RVR   PROCEDURE: Consent: The procedure with Risks/Benefits/Alternatives and Indications was reviewed with the patient in a and family.   All questions were answered.    Risks / Complications include, but not  limited to: Death, MI, CVA/TIA, VF/VT (with defibrillation), Bradycardia (need for temporary pacer placement), contrast induced nephropathy -- less of a concern due to him being on HD, bleeding / bruising / hematoma / pseudoaneurysm, vascular or coronary injury (with possible emergent CT or Vascular Surgery), adverse medication reactions, infection.    The patient and family voice understanding and agree to proceed.   Consent for signed by MD and patient with RN witness -- placed on chart.  Procedure: The patient was brought to the 2nd Floor [place] [facility] Lab in the fasting state and prepped and draped in the usual sterile fashion for Right groin access. Sterile technique was used including antiseptics, cap, gloves, gown, hand hygiene, mask and sheet.  Skin prep: Chlorhexidine;  Time Out: Verified patient identification, verified procedure, site/side was marked, verified correct patient position, special equipment/implants available, medications/allergies/relevent history reviewed, required imaging and test results available.  Performed.   Access: Right Common Femoray Artery  The right femoral head was identified using tactile and fluoroscopic technique.  The right groin was anesthetized with 1% subcutaneous Lidocaine.  The right Common Femoral Artery was accessed using the Modified Seldinger Technique with placement of a antimicrobial bonded/coated single lumen (6 Fr) sheath.  The sheath was aspirated and flushed.     Diagnostic:    6F5 JR4 - RCA Angiography  6Fr XBLAD 3.5 Guide -- LCA Angiography.  After completion of the PCI, a brief attempt was made to  cross the moderately stenotic aortic valve that was unsuccessful.  Hemodynamics:  Central Aortic / Mean Pressures: 136/65 mmHg; 93 mmHg  Coronary Anatomy:  Left Main: Large-caliber vessel branching into LAD and Left Circumflex with small Ramus Intermedius.   Mildly calcified with ostial 10% lesion.  LAD: Large  caliber vessel gives off a proximal Diagonal 1 followed shortly thereafter by to septal perforators and Diagonal 2. Just following Diagonal 2 there is a tubular 70-80% stenosis that continues beyond the second (Lesion #2) SP branch.  The remaining vessel has diffuse luminal irregularities and bifurcates into a small third diagonal branch in the distal LAD with diffuse luminal irregularities.  D1:  Small to moderate caliber vessel that bifurcates in the mid vessel. Diffuse luminal irregularities D2:  Small to moderate caliber vessel with a proximal 95% irregular lesion(Lesion #1) the vessel covers a large distribution along the mid antero-lateral wall.  The remainder the vessel has minimal luminal irregularities distally  Left Circumflex:  large caliber nondominant vessel with series of widely patent stents placed extending into was essentially a Lateral OM2 that bifurcates with the inferior branch jailed by the final stent. There is a small OM1 proximally also jailed by the first stent. The AV groove vessel is very small diffusely diseased vessel that terminates in a posterior lateral branch. OM1:  small-caliber vessel jailed by proximal Circumflex stent no ostial stenosis and TIMI-3 flow distally with minimal luminal irregularities.  Ramus intermedius:  small-caliber vessel it courses as a septal trunk.  RCA: Large caliber dominant vessel with minimal luminal irregularities; this vessel terminates as a large posterior descending artery that reaches down to the apex. There is also smaller right posterior lateral system with a long small-caliber RPL 1 and a smaller RPL 2 because of the AV nodal artery.  Percutaneous Coronary Intervention    Lesion #1: Proximal Diag2 95% lesion - reduced to ~10% residual stenosis; PTCA only  Guide: 6 Fr   XBLAD3.5 Guidewire: Prowater  Predilation Balloon: Sprinter Legend 1.25 mm x 15 mm   12 Atm x 30 Sec, 12 Atm x 35Sec  Final Balloon: Sprinter Legend 2.0 mm x 15  mm   6 Atm x 60 Sec, 10 Atm x 60 Sec;  After LAD PCI, the Diag2 was re-wired with the Prowater Wire for additional PTCA of the distal portion of the original lesion that demonstrated some recoil.  12 Atm x 120 Sec; 14 Atm x 60 Sec - final diameter 2.1 mm  Lesion #2: Mid LAD ~80% lesion just distal to Diag2 reduced to 0% with an Integrity Resolute DES stent  Guidewire: BMW  Stent: Integrity Resolute 2.75 mm x 14 mm    10 Atm x 30 Sec, 14 Atm x 45 Sec -- final diameter distal 2.9 mm  Postdilation Balloon: Thayer Sprinter 3.25 mm x 12 mm   14 Atm x 60Sec - final diameter 3.54mm proximal  Post-deployment cineangiography with and without guidewire in place was performed in multiple orthogonal views demonstrating excellent stent deployment and did not reveal evidence of dissection or perforation.  Diag 2 remained with ~10% residual stenosis, with no recoil 5 min after final dilation.  ANESTHESIA:   Local Lidocaine 16 ml SEDATION:  1 mg IV Versed, 25 mcg IV fentanyl  MEDICATIONS: Angiomax Bolus & gtt - renal dose; d/c'd upon completion of PCI.  EBL:   <  20 ml  PATIENT DISPOSITION:    The patient was stable before, during, and after the procedure.  There were  no complications.  He was transferred directly to the Cath Lab to the CCU for standard post catheterization care. The sheath will removed to our following discontinuation of Angiomax with recommend a pressure being held for hemostasis.  POST-OPERATIVE DIAGNOSIS:    Severe 2 site LAD and Diagonal 2 lesions treated successfully with PTCA Of Diagonal 2 and DES PCI of the mid LAD as described above.   Widely patent stents in the mid to distal circumflex into the large lateral OM.  PLAN OF CARE:  Continue dual antiplatelet therapy with aspirin and Brilinta, for minimum of one month, at which time aspirin can be discontinued as the patient will be on warfarin.  Initiate warfarin therapy tonight without IV Heparin bridge.  Continue  oral amiodarone loading for atrial fibrillation.    Marykay Lex, M.D., M.S. [facility] [address] [place], [place]  [address]  [phone]  10/01/2011 6:38 PM

## 2011-10-01 NOTE — H&P (Addendum)
. Patient ID: Roger [name] MRN: [id number], DOB/AGE: May 02, 1940   Admit date: 09/30/2011   Primary Physician: [name], MD Primary Cardiologist: Dr Herbie Baltimore  HPI: 71 y/o with a history of CAD, s/p STEMI 12/11 rx'd with CFX BMS. He had some residual LAD disease then but Myoview 1/12 was low risk. He presented May 2013 with NSTEMI and had DES to CFX/OM. The plan was to have an OP Myoview to access LAD. Yesterday he felt "great". He did some yard work without problems. Yesterday evening he developed abd and chest discomfort. He took antacids and NTG without relief. He took his B/P at home and noted his HR was elevated. He has had PAF in the past. He went to [facility] ER and is noted to be in rapid AF that converted with IV Diltiazem with resolution of his chest pain. His Troponin is rising, 1.45 now. He is transferred to [facility]. Currently he is pain free and in NSR.    Problem List: Past Medical History  Diagnosis Date  . HTN (hypertension)   . Arthritis   . Hypercholesteremia   . Kidney failure     secondary to HTN  . MI (myocardial infarction)   . GERD (gastroesophageal reflux disease)   . Atrial fibrillation   . Gout     Past Surgical History  Procedure Date  . Ureteral stent placement   . Colonoscopy 04/2009    with EGD by Dr.Buccini moderately large hiatal hernia could contribute to his anemia,Schatzki ring which might accout for his intermittent dysphagia/+internal hemorrhoids ;family hx of colon ca  . Esophagogastroduodenoscopy 03/2010    by Dr.Magod/small hiatal hernia with widley patent fibrous ring,minimal bulbitis otherwise nl  . Dialysis fistula creation   . Coronary angioplasty with stent placement 08/2011    Dr. Herbie Baltimore  . Coronary angioplasty with stent placement 2011    [name]     Allergies: No Known Allergies   Home Medications Prescriptions prior to admission  Medication Sig Dispense Refill  . allopurinol (ZYLOPRIM) 100 MG tablet Take 100 mg by mouth 2  (two) times daily.       [name] aspirin 81 MG chewable tablet Chew 81 mg by mouth every morning.      . Aspirin-Salicylamide-Caffeine (BC HEADACHE) 325-95-16 MG TABS Take 1 packet by mouth as needed.      . enalapril (VASOTEC) 10 MG tablet Take 10 mg by mouth at bedtime.       . isosorbide mononitrate (IMDUR) 30 MG 24 hr tablet Take 30 mg by mouth every morning.       . metoprolol tartrate (LOPRESSOR) 25 MG tablet Take 25 mg by mouth 2 (two) times daily.      . multivitamin (RENA-VIT) TABS tablet Take 1 tablet by mouth every morning.       . nitroGLYCERIN (NITROSTAT) 0.4 MG SL tablet Place 1 tablet (0.4 mg total) under the tongue every 5 (five) minutes as needed.  25 tablet  5  . pantoprazole (PROTONIX) 40 MG tablet Take 40 mg by mouth 2 (two) times daily.       . rosuvastatin (CRESTOR) 20 MG tablet Take 20 mg by mouth every evening.      . sevelamer (RENVELA) 800 MG tablet Take 2,400 mg by mouth 3 (three) times daily with meals.      . Ticagrelor (BRILINTA) 90 MG TABS tablet Take 1 tablet (90 mg total) by mouth 2 (two) times daily.  60 tablet  10  . PARoxetine (PAXIL)  20 MG tablet Take 1 tablet (20 mg total) by mouth daily.         Family History  Problem Relation Age of Onset  . Coronary artery disease Father   . Hypertension Mother   . Coronary artery disease Mother      History   Social History  . Marital Status: Married    Spouse Name: N/A    Number of Children: N/A  . Years of Education: N/A   Occupational History  . Not on file.   Social History Main Topics  . Smoking status: Former Smoker -- 0.5 packs/day for 45 years    Types: Cigarettes    Quit date: 04/13/1992  . Smokeless tobacco: Not on file  . Alcohol Use: No  . Drug Use: No  . Sexually Active: No   Other Topics Concern  . 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      Review of Systems: General: negative for chills, fever, night sweats or weight  changes.  Cardiovascular: negative for chest pain, dyspnea on exertion, edema, orthopnea, palpitations, paroxysmal nocturnal dyspnea or shortness of breath Dermatological: negative for rash Respiratory: negative for cough or wheezing Urologic: negative for hematuria Abdominal: negative for nausea, vomiting, diarrhea, bright red blood per rectum, melena, or hematemesis Neurologic: negative for visual changes, syncope, or dizziness All other systems reviewed and are otherwise negative except as noted above.  Physical Exam: Blood pressure 138/71, pulse 56, temperature 98.2 F (36.8 C), temperature source Oral, resp. rate 17, height 5\' 8"  (1.727 m), weight 76.4 kg (168 lb 6.9 oz), SpO2 100.00%.  General appearance: alert, cooperative and no distress Neck: no adenopathy, no carotid bruit, no JVD, supple, symmetrical, trachea midline, thyroid not enlarged, symmetric, no tenderness/mass/nodules and transmitted murmur to carotids Lungs: clear to auscultation bilaterally Heart: regular rate and rhythm and 2/6 systolic murmur LSB Aov Abdomen: soft, non-tender; bowel sounds normal; no masses,  no organomegaly Extremities: extremities normal, atraumatic, no cyanosis or edema Pulses: 2+ and symmetric LLE dist pulses diminnished compaired with Rt  Skin: Skin color, texture, turgor normal. No rashes or lesions Neurologic: Grossly normal    Labs:   Results for orders placed during the hospital encounter of 09/30/11 (from the past 24 hour(s))  CBC     Status: Abnormal   Collection Time   09/30/11 10:56 PM      Component Value Range   WBC 5.1  4.0 - 10.5 K/uL   RBC 3.07 (*) 4.22 - 5.81 MIL/uL   Hemoglobin 11.0 (*) 13.0 - 17.0 g/dL   HCT 16.1 (*) 09.6 - 04.5 %   MCV 107.5 (*) 78.0 - 100.0 fL   MCH 35.8 (*) 26.0 - 34.0 pg   MCHC 33.3  30.0 - 36.0 g/dL   RDW 40.9  81.1 - 91.4 %   Platelets 195  150 - 400 K/uL  DIFFERENTIAL     Status: Abnormal   Collection Time   09/30/11 10:56 PM      Component  Value Range   Neutrophils Relative 38 (*) 43 - 77 %   Neutro Abs 1.9  1.7 - 7.7 K/uL   Lymphocytes Relative 45  12 - 46 %   Lymphs Abs 2.3  0.7 - 4.0 K/uL   Monocytes Relative 9  3 - 12 %   Monocytes Absolute 0.5  0.1 - 1.0 K/uL   Eosinophils Relative 7 (*) 0 - 5 %  Eosinophils Absolute 0.3  0.0 - 0.7 K/uL   Basophils Relative 1  0 - 1 %   Basophils Absolute 0.1  0.0 - 0.1 K/uL  COMPREHENSIVE METABOLIC PANEL     Status: Abnormal   Collection Time   09/30/11 10:56 PM      Component Value Range   Sodium 138  135 - 145 mEq/L   Potassium 4.2  3.5 - 5.1 mEq/L   Chloride 95 (*) 96 - 112 mEq/L   CO2 21  19 - 32 mEq/L   Glucose, Bld 122 (*) 70 - 99 mg/dL   BUN 36 (*) 6 - 23 mg/dL   Creatinine, Ser 0.98 (*) 0.50 - 1.35 mg/dL   Calcium 11.9 (*) 8.4 - 10.5 mg/dL   Total Protein 7.9  6.0 - 8.3 g/dL   Albumin 4.0  3.5 - 5.2 g/dL   AST 11  0 - 37 U/L   ALT 11  0 - 53 U/L   Alkaline Phosphatase 104  39 - 117 U/L   Total Bilirubin 0.1 (*) 0.3 - 1.2 mg/dL   GFR calc non Af Amer 5 (*) >90 mL/min   GFR calc Af Amer 6 (*) >90 mL/min  APTT     Status: Normal   Collection Time   09/30/11 10:56 PM      Component Value Range   aPTT 33  24 - 37 seconds  PROTIME-INR     Status: Normal   Collection Time   09/30/11 10:56 PM      Component Value Range   Prothrombin Time 14.4  11.6 - 15.2 seconds   INR 1.10  0.00 - 1.49  POCT I-STAT TROPONIN I     Status: Normal   Collection Time   09/30/11 11:33 PM      Component Value Range   Troponin i, poc 0.04  0.00 - 0.08 ng/mL   Comment 3           MRSA PCR SCREENING     Status: Normal   Collection Time   10/01/11  3:27 AM      Component Value Range   MRSA by PCR NEGATIVE  NEGATIVE  CBC     Status: Abnormal   Collection Time   10/01/11  3:31 AM      Component Value Range   WBC 4.8  4.0 - 10.5 K/uL   RBC 2.90 (*) 4.22 - 5.81 MIL/uL   Hemoglobin 10.5 (*) 13.0 - 17.0 g/dL   HCT 14.7 (*) 82.9 - 56.2 %   MCV 109.3 (*) 78.0 - 100.0 fL   MCH 36.2 (*) 26.0  - 34.0 pg   MCHC 33.1  30.0 - 36.0 g/dL   RDW 13.0  86.5 - 78.4 %   Platelets 185  150 - 400 K/uL  COMPREHENSIVE METABOLIC PANEL     Status: Abnormal   Collection Time   10/01/11  3:31 AM      Component Value Range   Sodium 140  135 - 145 mEq/L   Potassium 4.3  3.5 - 5.1 mEq/L   Chloride 99  96 - 112 mEq/L   CO2 23  19 - 32 mEq/L   Glucose, Bld 84  70 - 99 mg/dL   BUN 37 (*) 6 - 23 mg/dL   Creatinine, Ser 6.96 (*) 0.50 - 1.35 mg/dL   Calcium 29.5  8.4 - 28.4 mg/dL   Total Protein 6.7  6.0 - 8.3 g/dL   Albumin 3.7  3.5 - 5.2  g/dL   AST 10  0 - 37 U/L   ALT 9  0 - 53 U/L   Alkaline Phosphatase 88  39 - 117 U/L   Total Bilirubin 0.1 (*) 0.3 - 1.2 mg/dL   GFR calc non Af Amer 5 (*) >90 mL/min   GFR calc Af Amer 6 (*) >90 mL/min  CARDIAC PANEL(CRET KIN+CKTOT+MB+TROPI)     Status: Abnormal   Collection Time   10/01/11  3:31 AM      Component Value Range   Total CK 97  7 - 232 U/L   CK, MB 5.3 (*) 0.3 - 4.0 ng/mL   Troponin I 0.78 (*) <0.30 ng/mL   Relative Index RELATIVE INDEX IS INVALID  0.0 - 2.5  CARDIAC PANEL(CRET KIN+CKTOT+MB+TROPI)     Status: Abnormal   Collection Time   10/01/11  8:00 AM      Component Value Range   Total CK 96  7 - 232 U/L   CK, MB 6.7 (*) 0.3 - 4.0 ng/mL   Troponin I 1.45 (*) <0.30 ng/mL   Relative Index RELATIVE INDEX IS INVALID  0.0 - 2.5     Radiology/Studies: No results found.  EKG:NSR without acute changes  ASSESSMENT AND PLAN:  Principal Problem:  *Paroxysmal atrial fibrillation, symptomatic with chest pain when in AF Active Problems:  NSTEMI (non-ST elevated myocardial infarction)  CAD, MI CFX BMS 12/11 with ISR DES (as well as OM) May 2013  Chronic kidney failure: HD T, TH, Sat  ANEMIA-UNSPECIFIED  HTN (hypertension)  PAD (peripheral artery disease): 60% bilateral common iliac stenosis   HLD (hyperlipidemia)  Bradycardia when in NSR  Plan- Dr Herbie Baltimore to see. I will notify renal service. Hold ACE, add Diltiazem low  dose.  Deland Pretty, PA-C 10/01/2011, 12:58 PM  ATTENDING ATTESTATION:  I have seen and examined the patient along with Darcella Gasman, PA.  I have reviewed the chart, notes and new data.  I agree with Luke's note.  Brief Description: 71 y/o well known to me (pt of Dr. Dorma Russell) with prior Inf STEMI in 03/2010 - Rx with BMS with LAD/Diag disease Rx'd medically, with Mod-Severe AS & ESRD on HD.  Admitted 08/21/11 with Afib RVR/NSTEMI - ISR/thrombosis - complex / extensive PCI of LCx-OM.  D/c'd on monitor to assess Afib burden & plan for OP ST to evaluate LAD lesion.   He presented to [facility] ER last PM with "chest fullness" & SOB --> noted rapid irregular HR on BP monitor.  @ [facility] ER - IV Diltiazem bolus given --> briefly hypotensive, but converted to NSR.  Was admitted for o/n monitoring.  Troponin noted to be elevated c/w likely supply vs. Demand ischemia with RVR & existing LAD lesion (essentially failing his NST).    Exam: RRR (brady in 50s, NSR on monitor), AS murmur as described above.  Lungs CTAB, non-labored. 2+ pulses with femoral bruits.  PLAN:  Likely Type II MI with RVR - but given known LAD/Diag lesion & recent extensive PCI, will plan to proceed to cath lab for Dx LHC & most likely PCI of LAD +/- Diag lesion.  He is already on ASA/Brilinta, but unfortunately Heparin not initiated  Will initiate Warfarin Rx with short term IV Heparin bridging (may not need full bridge) -- plan to d/c ASA after ~29month.  As per d/w Dr. Dorma Russell - will also start PO Load of Amiodarone for rhythm control.  Continue BB & statin, but once Amiodarone inititated, will d/c CCB.  Have d/w Nephrology - HD tonite or in AM post cath/PCI.   Marykay Lex, M.D., M.S. [facility] [address] [place], [place]  [address]  [phone]  10/01/2011 1:40 PM  CARDIAC CATHETERIZATION CONSENT  Performing MD:  Marykay Lex, M.D., M.S.  Procedure:  Left Heart  Catheterization with Coronary Angiography AND Percutaneous Coronary Intervention (Balloon & Stent)  The procedure with Risks/Benefits/Alternatives and Indications was reviewed with the patient and family.  All questions were answered.    Risks / Complications include, but not limited to: Death, MI, CVA/TIA, VF/VT (with defibrillation), Bradycardia (need for temporary pacer placement), contrast induced nephropathy - not a major concern as he is on HD, bleeding / bruising / hematoma / pseudoaneurysm, vascular or coronary injury (with possible emergent CT or Vascular Surgery), adverse medication reactions, infection.    The patient and family voice understanding and agree proceed.   I have signed the consent form and placed it on the chart for patient signature and RN witness.     Marykay Lex, M.D., M.S. [facility] [address] [place], [place]  [address]  [phone]  10/01/2011 1:50 PM

## 2011-10-01 NOTE — Progress Notes (Signed)
Critical CKMB and troponin faxed to Dr Hazle Coca office, at their request. Pt and wife informed of critical labs in layman's terms. Pt placed on 2l O2 per nasal cannula. EKG ordered stat per protocol.

## 2011-10-01 NOTE — Care Management Note (Unsigned)
    Page 1 of 1   10/01/2011     10:45:31 AM   CARE MANAGEMENT NOTE 10/01/2011  Patient:  Roger [name], Roger [name]   Account Number:  [id number]  Date Initiated:  10/01/2011  Documentation initiated by:  Sharrie Rothman  Subjective/Objective Assessment:   Pt admitted from home with A fib with RVR. Pt lives with wife and will return home at discharge. Pt is independent with ADL's.     Action/Plan:   No CM needs noted.   Anticipated DC Date:  10/02/2011   Anticipated DC Plan:  HOME/SELF CARE      DC Planning Services  CM consult      Choice offered to / List presented to:             Status of service:  Completed, signed off Medicare Important Message given?   (If response is "NO", the following Medicare IM given date fields will be blank) Date Medicare IM given:   Date Additional Medicare IM given:    Discharge Disposition:  ACUTE TO ACUTE TRANS  Per UR Regulation:    If discussed at Long Length of Stay Meetings, dates discussed:    Comments:  10/01/11 1043 Arlyss Queen, RN BSN CM Pt being transfered to [facility] to services of [facility].

## 2011-10-01 NOTE — ED Notes (Signed)
Heart rate remains between 115-130 bpm. Chest pressure 5\10. No distress. Denies needs. Call bell within reach. Will continue to monitor.

## 2011-10-01 NOTE — H&P (Signed)
Roger [name] is an 71 y.o. male.   Chief Complaint: palpitation HPI: 71 yo male with hx of htn, Pafib, CAD s/p stent, ESRD on HD T, T, S apparently c/o palpitations starting about 7pm last nite.  Denies fever, chills, cough, cp, n/v, sob, diarrhea, brbpr, black stool.   Pt presented to ED for evaluation and found in Afib with RVR  In ED: started on cardizem drip and converted to nsr.  Dr. Lynelle Doctor apparently consulted cardiology who asked that hospitalist service admit the patient overnite for AFib with RVR   Past Medical History  Diagnosis Date  . HTN (hypertension)   . Arthritis   . Hypercholesteremia   . Kidney failure     secondary to HTN  . MI (myocardial infarction)   . GERD (gastroesophageal reflux disease)   . Atrial fibrillation     Past Surgical History  Procedure Date  . Ureteral stent placement   . Colonoscopy 04/2009    with EGD by Dr.Buccini moderately large hiatal hernia could contribute to his anemia,Schatzki ring which might accout for his intermittent dysphagia/+internal hemorrhoids ;family hx of colon ca  . Esophagogastroduodenoscopy 03/2010    by Dr.Magod/small hiatal hernia with widley patent fibrous ring,minimal bulbitis otherwise nl  . Dialysis fistula creation   . Coronary angioplasty with stent placement 08/2011    Dr. Herbie Baltimore  . Coronary angioplasty with stent placement 2011    [name]    Family History  Problem Relation Age of Onset  . Coronary artery disease Father   . Hypertension Mother   . Coronary artery disease Mother    Social History:  reports that he quit smoking about 19 years ago. His smoking use included Cigarettes. He has a 22.5 pack-year smoking history. He does not have any smokeless tobacco history on file. He reports that he does not drink alcohol or use illicit drugs.  Allergies: No Known Allergies   (Not in a hospital admission)  Results for orders placed during the hospital encounter of 09/30/11 (from the past 48 hour(s))    CBC     Status: Abnormal   Collection Time   09/30/11 10:56 PM      Component Value Range Comment   WBC 5.1  4.0 - 10.5 K/uL    RBC 3.07 (*) 4.22 - 5.81 MIL/uL    Hemoglobin 11.0 (*) 13.0 - 17.0 g/dL    HCT 16.1 (*) 09.6 - 52.0 %    MCV 107.5 (*) 78.0 - 100.0 fL    MCH 35.8 (*) 26.0 - 34.0 pg    MCHC 33.3  30.0 - 36.0 g/dL    RDW 04.5  40.9 - 81.1 %    Platelets 195  150 - 400 K/uL   DIFFERENTIAL     Status: Abnormal   Collection Time   09/30/11 10:56 PM      Component Value Range Comment   Neutrophils Relative 38 (*) 43 - 77 %    Neutro Abs 1.9  1.7 - 7.7 K/uL    Lymphocytes Relative 45  12 - 46 %    Lymphs Abs 2.3  0.7 - 4.0 K/uL    Monocytes Relative 9  3 - 12 %    Monocytes Absolute 0.5  0.1 - 1.0 K/uL    Eosinophils Relative 7 (*) 0 - 5 %    Eosinophils Absolute 0.3  0.0 - 0.7 K/uL    Basophils Relative 1  0 - 1 %    Basophils Absolute 0.1  0.0 - 0.1 K/uL   COMPREHENSIVE METABOLIC PANEL     Status: Abnormal   Collection Time   09/30/11 10:56 PM      Component Value Range Comment   Sodium 138  135 - 145 mEq/L    Potassium 4.2  3.5 - 5.1 mEq/L    Chloride 95 (*) 96 - 112 mEq/L    CO2 21  19 - 32 mEq/L    Glucose, Bld 122 (*) 70 - 99 mg/dL    BUN 36 (*) 6 - 23 mg/dL    Creatinine, Ser 1.61 (*) 0.50 - 1.35 mg/dL    Calcium 09.6 (*) 8.4 - 10.5 mg/dL    Total Protein 7.9  6.0 - 8.3 g/dL    Albumin 4.0  3.5 - 5.2 g/dL    AST 11  0 - 37 U/L    ALT 11  0 - 53 U/L    Alkaline Phosphatase 104  39 - 117 U/L    Total Bilirubin 0.1 (*) 0.3 - 1.2 mg/dL    GFR calc non Af Amer 5 (*) >90 mL/min    GFR calc Af Amer 6 (*) >90 mL/min   APTT     Status: Normal   Collection Time   09/30/11 10:56 PM      Component Value Range Comment   aPTT 33  24 - 37 seconds   PROTIME-INR     Status: Normal   Collection Time   09/30/11 10:56 PM      Component Value Range Comment   Prothrombin Time 14.4  11.6 - 15.2 seconds    INR 1.10  0.00 - 1.49   POCT I-STAT TROPONIN I     Status: Normal    Collection Time   09/30/11 11:33 PM      Component Value Range Comment   Troponin i, poc 0.04  0.00 - 0.08 ng/mL    Comment 3             No results found.  Review of Systems  Constitutional: Negative for fever, chills, weight loss, malaise/fatigue and diaphoresis.  HENT: Negative for hearing loss, ear pain, nosebleeds, congestion, sore throat, neck pain, tinnitus and ear discharge.   Eyes: Negative for blurred vision, double vision, photophobia, pain, discharge and redness.  Respiratory: Negative for cough, hemoptysis, sputum production, shortness of breath, wheezing and stridor.   Cardiovascular: Positive for palpitations. Negative for chest pain, orthopnea, claudication, leg swelling and PND.  Gastrointestinal: Negative for heartburn, nausea, vomiting, abdominal pain, diarrhea, constipation, blood in stool and melena.  Genitourinary: Negative for dysuria, urgency, frequency, hematuria and flank pain.  Musculoskeletal: Positive for joint pain. Negative for myalgias, back pain and falls.  Skin: Negative for itching and rash.  Neurological: Negative for dizziness, tingling, tremors, sensory change, speech change, focal weakness, seizures, loss of consciousness, weakness and headaches.  Endo/Heme/Allergies: Negative for environmental allergies and polydipsia. Does not bruise/bleed easily.  Psychiatric/Behavioral: Negative for depression, suicidal ideas, hallucinations and substance abuse. The patient is not nervous/anxious and does not have insomnia.     Blood pressure 121/79, pulse 143, temperature 98.6 F (37 C), temperature source Oral, resp. rate 20, height 5\' 9"  (1.753 m), weight 77.111 kg (170 lb), SpO2 98.00%. Physical Exam  Constitutional: He is oriented to person, place, and time. He appears well-developed and well-nourished. No distress.  HENT:  Head: Normocephalic and atraumatic.  Mouth/Throat: No oropharyngeal exudate.  Eyes: Conjunctivae and EOM are normal. Pupils are equal,  round, and reactive to light.  Right eye exhibits no discharge. Left eye exhibits no discharge. No scleral icterus.  Neck: Normal range of motion. Neck supple. No JVD present. No tracheal deviation present. No thyromegaly present.  Cardiovascular: Normal rate, regular rhythm and normal heart sounds.  Exam reveals no gallop and no friction rub.   No murmur heard. Respiratory: Effort normal and breath sounds normal. No stridor. No respiratory distress. He has no wheezes. He has no rales. He exhibits no tenderness.  GI: Soft. Bowel sounds are normal. He exhibits no distension and no mass. There is no tenderness. There is no rebound and no guarding.  Musculoskeletal: Normal range of motion. He exhibits no edema and no tenderness.  Lymphadenopathy:    He has no cervical adenopathy.  Neurological: He is alert and oriented to person, place, and time. He has normal reflexes. He displays normal reflexes. No cranial nerve deficit. He exhibits normal muscle tone. Coordination normal.  Skin: Skin is warm and dry. No rash noted. He is not diaphoretic. No erythema. No pallor.  Psychiatric: He has a normal mood and affect. His behavior is normal. Judgment and thought content normal.     Assessment/Plan Afib with RVR CAD s/p stent Hypertension Hyperlipidemia ESRD on HD T, T, S Anemia Hypercalcemia Gout  Tele Cpk , mb, trop q6h x3 Cardiac 2d echo Continue cardizem drip Consult cardiology for afib with rvr Please involve nephrology in am for dialysis Repeat cbc, cmp due to anemia and hypercalcemia in am 10/01/2011  Appreciate cardiology and nephrology input    Pearson Grippe 10/01/2011, 2:02 AM   Cc:  [name], Dr. Briant Cedar, Dr. Soyla Murphy (pcp)

## 2011-10-01 NOTE — Progress Notes (Signed)
Patient picked up by [facility], for transfer to [facility], at 1125. [facility], [facility] packet, and EKGs given to transporters.

## 2011-10-01 NOTE — Progress Notes (Signed)
Per Dr Renard Matter patient is to be transferred to [facility] at [facility]. Patient and wife are aware, and wife spoke to Dr Renard Matter by phone. Pt denies chest pain or dyspnea. He does complain of neck stiffness. EKG obtained and faxed to Dr [name].

## 2011-10-01 NOTE — Progress Notes (Signed)
ANTICOAGULATION CONSULT NOTE - Initial Consult  Pharmacy Consult for Coumadin Indication: atrial fibrillation  No Known Allergies  Patient Measurements: Height: 5\' 8"  (172.7 cm) Weight: 168 lb 6.9 oz (76.4 kg) IBW/kg (Calculated) : 68.4  Heparin Dosing Weight:   Vital Signs: Temp: 98.2 F (36.8 C) (06/20 0800) Temp src: Oral (06/20 0800) BP: 131/64 mmHg (06/20 1845) Pulse Rate: 56  (06/20 1845)  Labs:  Basename 10/01/11 0800 10/01/11 0331 09/30/11 2256  HGB -- 10.5* 11.0*  HCT -- 31.7* 33.0*  PLT -- 185 195  APTT -- -- 33  LABPROT -- -- 14.4  INR -- -- 1.10  HEPARINUNFRC -- -- --  CREATININE -- 9.17* 9.07*  CKTOTAL 96 97 --  CKMB 6.7* 5.3* --  TROPONINI 1.45* 0.78* --    Estimated Creatinine Clearance: 7.4 ml/min (by C-G formula based on Cr of 9.17).   Medical History: Past Medical History  Diagnosis Date  . HTN (hypertension)   . Arthritis   . Hypercholesteremia   . Kidney failure     secondary to HTN  . MI (myocardial infarction)   . GERD (gastroesophageal reflux disease)   . Atrial fibrillation   . Gout     Medications:  Scheduled:    . allopurinol  100 mg Oral BID  . amiodarone  400 mg Oral BID  . aspirin  81 mg Oral Daily  . atorvastatin  40 mg Oral q1800  . bivalirudin      . darbepoetin (ARANESP) injection - DIALYSIS  12.5 mcg Intravenous Once  . diazepam  5 mg Oral On Call  . diltiazem  10 mg Intravenous Once  . fentaNYL      . ferric gluconate (FERRLECIT/NULECIT) IV  62.5 mg Intravenous Once  . ferric gluconate (FERRLECIT/NULECIT) IV  62.5 mg Intravenous Q Thu-HD  . heparin      . isosorbide mononitrate  30 mg Oral Daily  . lidocaine      . metoprolol tartrate  25 mg Oral BID  . midazolam      . multivitamin  1 tablet Oral Daily  . nitroGLYCERIN      . pantoprazole  40 mg Oral BID  . paricalcitol  6 mcg Intravenous Q T,Th,Sa-HD  . PARoxetine  20 mg Oral Daily  . sevelamer  2,400 mg Oral TID WC  . sodium chloride  3 mL Intravenous  Q12H  . sodium chloride  3 mL Intravenous Q12H  . Ticagrelor  90 mg Oral BID  . DISCONTD: amiodarone  400 mg Oral BID  . DISCONTD: aspirin  81 mg Oral Pre-Cath  . DISCONTD: diltiazem  30 mg Oral Q8H  . DISCONTD: enalapril  10 mg Oral QHS  . DISCONTD: metoprolol tartrate  25 mg Oral BID  . DISCONTD: sodium chloride  3 mL Intravenous Q12H    Assessment: 71 yr old male presented to the [facility] with chest discomfort and elevated HR. He was transferred to [facility] for coronary angiography and PCI. He is now post cath, has been started on Brilinta, aspirin, loaded with PO amiodarone and is to start coumadin with no heparin bridge.  Goal of Therapy:  INR 2-3 Monitor platelets by anticoagulation protocol: Yes   Plan:  Coumadin is to start 6 hrs after sheath pull per MD. Will give 5 mg at 0300, check daily PT/INR. Ordered coumadin booklet and video for pt. Note that pt is also on Brilinta, aspirin and amiodarone.  Eugene Garnet 10/01/2011,7:43 PM

## 2011-10-01 NOTE — Progress Notes (Signed)
Report called to Pershing Proud RN, accepting nurse at unit [address], [facility].

## 2011-10-01 NOTE — Progress Notes (Signed)
Dr Benay Spice office notified of patients critical CKMB and troponin levels as per Dr Lorenso Courier request.

## 2011-10-01 NOTE — Progress Notes (Signed)
CRITICAL VALUE ALERT  Critical value received:  CKMB 6.7, Troponin 1.45  Date of notification:  10/01/11  Time of notification:  0846  Critical value read back:yes Nurse who received alert:  Lovena Neighbours RN  MD notified (1st page): Dr Renard Matter  Time of first page:  [phone]  MD notified (2nd page):Dr McInnis  Time of second [id number]  Responding MD:  Dr Renard Matter  Time MD responded:  [phone]

## 2011-10-01 NOTE — ED Notes (Signed)
Resting comfortably in bed with eyes closed. Heart rate between 60-70 bpm. Sinus rhythm. No distress. No facial grimaces. Call bell within reach. Wife with patient.

## 2011-10-02 ENCOUNTER — Inpatient Hospital Stay (HOSPITAL_COMMUNITY): Payer: Medicare Other

## 2011-10-02 ENCOUNTER — Encounter (HOSPITAL_COMMUNITY): Payer: Self-pay | Admitting: Cardiology

## 2011-10-02 LAB — PROTIME-INR: INR: 1.21 (ref 0.00–1.49)

## 2011-10-02 LAB — BASIC METABOLIC PANEL
Calcium: 9.7 mg/dL (ref 8.4–10.5)
GFR calc Af Amer: 5 mL/min — ABNORMAL LOW (ref >90)
GFR calc non Af Amer: 4 mL/min — ABNORMAL LOW (ref >90)
Potassium: 5.5 mEq/L — ABNORMAL HIGH (ref 3.5–5.1)
Sodium: 132 mEq/L — ABNORMAL LOW (ref 135–145)

## 2011-10-02 LAB — CBC
MCH: 35.4 pg — ABNORMAL HIGH (ref 26.0–34.0)
MCHC: 33 g/dL (ref 30.0–36.0)
Platelets: 194 10*3/uL (ref 150–400)
RDW: 14.1 % (ref 11.5–15.5)

## 2011-10-02 LAB — POCT ACTIVATED CLOTTING TIME: Activated Clotting Time: 359 seconds

## 2011-10-02 MED ORDER — DARBEPOETIN ALFA-POLYSORBATE 25 MCG/0.42ML IJ SOLN
INTRAMUSCULAR | Status: AC
  Administered 2011-10-02: 12.5 ug via INTRAVENOUS
  Filled 2011-10-02: qty 0.42

## 2011-10-02 MED ORDER — WARFARIN SODIUM 5 MG PO TABS
5.0000 mg | ORAL_TABLET | Freq: Once | ORAL | Status: AC
  Administered 2011-10-02: 5 mg via ORAL
  Filled 2011-10-02: qty 1

## 2011-10-02 MED FILL — Dextrose Inj 5%: INTRAVENOUS | Qty: 50 | Status: AC

## 2011-10-02 NOTE — Procedures (Signed)
I was present at this dialysis session. I have reviewed the session itself and made appropriate changes.   Vinson Moselle, MD [employer] 10/02/2011, 9:33 AM

## 2011-10-02 NOTE — Progress Notes (Signed)
[facility] Progress Note  Subjective: No SOB, CP or palpitations.  Not happy with having to take coumadin, but will do what he has to do.  Objective Filed Vitals:   10/02/11 0645 10/02/11 0715 10/02/11 0730 10/02/11 0745  BP: 162/76 142/68 143/67   Pulse: 66 62 62 61  Temp:      TempSrc:      Resp: 22 21 24 22   Height:      Weight:      SpO2:       Physical Exam General: Rouses easily from sleep on HD Heart: RRR III/VI RUSB Lungs:no wheezes or rales Abdomen:soft NT Extremities:SCDs, no LE edema Dialysis Access: left upper AVF Qb 400 (RN ^ to 450)  Dialysis Orders: Center: [facility]) in [place] T[place].  EDW 76.5kg HD Bath 2K/2.25Ca Time 3:15 Heparin 2,000. Access LUA AVF (placed 12/13/03) BFR 450 DFR A2.0 (800) Zemplar IV/HD Epogen None Units IV/HD Venofer 50mg  qwk (Thu)   Assessment/Plan: 1. 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 Herbie Baltimore.  Stents placed in May widely patent. Per cards, continue dual antiplatelet therapy with aspirin and Brilinta, for minimum of one month, then d/c aspirin as the patient will be on warfarin. Warfarin therapy initiated without IV Heparin bridge.  Continue oral amiodarone loading for atrial fibrillation.  2. ESRD - MWF K 5.5 on 2 K bath; only dialyzes 3.25 hours.  I think given his cardiac issues, CAD, AS, that he would do better on HD with longer time. 3. Anemia - Hgb stable - continue same ESA and IV Fe 4. Secondary hyperparathyroidism - Ca elevated on admission, down to 9.7 5. HTN/volume - current meds, reassess EDW 6. Nutrition - changed to high protein, renal diet, d/c heart diet due to high K content  Sheffield Slider, PA-C [facility] [phone]  10/02/2011,8:02 AM  LOS: 2 days   Patient seen and examined and agree  with assessment and plan as above. Roger Moselle  MD [facility] [phone] pgr    [phone] cell 10/02/2011, 9:32 AM   Additional Objective Labs: Basic Metabolic Panel:  Lab 10/02/11 2130 10/01/11 0331 09/30/11 2256  NA 132* 140 138  K 5.5* 4.3 4.2  CL 93* 99 95*  CO2 18* 23 21  GLUCOSE 107* 84 122*  BUN 51* 37* 36*  CREATININE 11.02* 9.17* 9.07*  CALCIUM 9.7 10.0 10.9*  ALB -- -- --  PHOS -- -- --   Liver Function Tests:  Lab 10/01/11 0331 09/30/11 2256  AST 10 11  ALT 9 11  ALKPHOS 88 104  BILITOT 0.1* 0.1*  PROT 6.7 7.9  ALBUMIN 3.7 4.0   Lab Results  Component Value Date   INR 1.10 09/30/2011   INR 1.02 08/21/2011   CBC:  Lab 10/02/11 0525 10/01/11 0331 09/30/11 2256  WBC 5.9 4.8 5.1  NEUTROABS -- -- 1.9  HGB 10.7* 10.5* 11.0*  HCT 32.4* 31.7* 33.0*  MCV 107.3* 109.3* 107.5*  PLT 194 185 195  Cardiac Enzymes:  Lab 10/01/11 0800 10/01/11 0331  CKTOTAL 96 97  CKMB 6.7* 5.3*  CKMBINDEX -- --  TROPONINI 1.45* 0.78*   Medications:     . allopurinol  100 mg Oral BID  . amiodarone  400 mg Oral BID  .  aspirin  81 mg Oral Daily  . atorvastatin  40 mg Oral q1800  . bivalirudin      . darbepoetin (ARANESP) injection - DIALYSIS  12.5 mcg Intravenous Once  . diazepam  5 mg Oral On Call  . fentaNYL      . ferric gluconate (FERRLECIT/NULECIT) IV  62.5 mg Intravenous Once  . ferric gluconate (FERRLECIT/NULECIT) IV  62.5 mg Intravenous Q Thu-HD  . heparin      . isosorbide mononitrate  30 mg Oral Daily  . lidocaine      . metoprolol tartrate  25 mg Oral BID  . midazolam      . multivitamin  1 tablet Oral Daily  . nitroGLYCERIN      . pantoprazole  40 mg Oral BID  . paricalcitol  6 mcg Intravenous Q T,Th,Sa-HD  . PARoxetine  20 mg Oral Daily  . sevelamer  2,400 mg Oral TID WC  . sodium chloride  3 mL Intravenous Q12H  . sodium chloride  3 mL Intravenous Q12H  . Ticagrelor  90 mg Oral BID  . warfarin  5 mg Oral Once    I  have reviewed  scheduled and prn medications.

## 2011-10-02 NOTE — Progress Notes (Signed)
CARDIAC REHAB PHASE I   PRE:  Rate/Rhythm: 75 SR    BP: sitting 86/50, 100/50standing    SaO2: 100 2L RA  MODE:  Ambulation: 170 ft   POST:  Rate/Rhythm: 74 SR    BP: sitting 100/48     SaO2: 97 2L  Pt feeling SOB earlier so wearing O2. BP on low side. Used RW, 2L and assist x2. Pt felt DOE and had to sit and rest. Lips pale. SaO2 stable. Had to stand and rest again later in walk. Questionable confusion at times. Return to chair. VSS. Left on O2. Denied CP, just feels SOB at rest and walking. Reviewed ed. Pt sts he gets enough ex and not interested in CRPII. [id number]  Roger [name] CES, ACSM

## 2011-10-02 NOTE — Progress Notes (Signed)
Pt has exhibited very inappropriate language and suggestive sexual language to me and other nurses. Pt stated "are you going to rub it," referring to his cath site on his right groin.  Pt also continued to refer to me as "baby" and other inappropriate names, even after multiple attempts of reinforcing my name.  Pt also continuously attempted touching my arm, lower back & side, even after repeated redirection of hands.  When removing pressure dressing from groin, pt stated "I need to get as much out of you as I can before you leave."  I continued to enforce professionalism and decorum.  Pt then stated "well that is just my personality and how I talk, and if you don't like it, then you can get another pt."  Pt's wife at bedside entire time, with no response.

## 2011-10-02 NOTE — Progress Notes (Signed)
[facility]  DAILY PROGRESS NOTE   Subjective:  Seen in dialysis, feels well other than being cold. Denies chest pain.  Objective:  Temp:  [97.7 F (36.5 C)-98.8 F (37.1 C)] 98.8 F (37.1 C) (06/21 1000) Pulse Rate:  [55-126] 72  (06/21 1000) Resp:  [13-27] 18  (06/21 1000) BP: (105-165)/(49-109) 122/62 mmHg (06/21 1000) SpO2:  [91 %-100 %] 99 % (06/21 1000) Weight:  [77 kg (169 lb 12.1 oz)-78.8 kg (173 lb 11.6 oz)] 77 kg (169 lb 12.1 oz) (06/21 1000) Weight change: 1.689 kg (3 lb 11.6 oz)  Intake/Output from previous day: 06/20 0701 - 06/21 0700 In: 684.1 [P.O.:240; I.V.:444.1] Out: -   Intake/Output from this shift: Total I/O In: -  Out: 1800 [Other:1800]  Medications: Current Facility-Administered Medications  Medication Dose Route Frequency Provider Last Rate Last Dose  . 0.9 %  sodium chloride infusion  250 mL Intravenous Continuous Marykay Lex, MD      . acetaminophen (TYLENOL) tablet 650 mg  650 mg Oral Q6H PRN Lizbeth Bark, FNP       Or  . acetaminophen (TYLENOL) suppository 650 mg  650 mg Rectal Q6H PRN Lizbeth Bark, FNP      . allopurinol (ZYLOPRIM) tablet 100 mg  100 mg Oral BID Massie Maroon, MD   100 mg at 10/01/11 2156  . ALPRAZolam Prudy Feeler) tablet 0.25 mg  0.25 mg Oral TID PRN Abelino Derrick, PA   0.25 mg at 10/01/11 2007  . alteplase (CATHFLO ACTIVASE) injection 2 mg  2 mg Intracatheter Once PRN Lizbeth Bark, FNP      . amiodarone (PACERONE) tablet 400 mg  400 mg Oral BID Marykay Lex, MD   400 mg at 10/01/11 1909  . aspirin chewable tablet 81 mg  81 mg Oral Daily Massie Maroon, MD   81 mg at 10/01/11 0930  . atorvastatin (LIPITOR) tablet 40 mg  40 mg Oral q1800 Massie Maroon, MD      . bivalirudin (ANGIOMAX) 250 MG injection           . calcium carbonate (dosed in mg elemental calcium) suspension 500 mg of elemental calcium  500 mg of elemental calcium Oral Q6H PRN Lizbeth Bark, FNP      . camphor-menthol (SARNA) lotion 1  application  1 application Topical Q8H PRN Lizbeth Bark, FNP       And  . hydrOXYzine (ATARAX/VISTARIL) tablet 25 mg  25 mg Oral Q8H PRN Lizbeth Bark, FNP      . darbepoetin Stan Head) injection 12.5 mcg  12.5 mcg Intravenous Once Lizbeth Bark, FNP   12.5 mcg at 10/02/11 0758  . diazepam (VALIUM) tablet 5 mg  5 mg Oral On Call Abelino Derrick, PA   5 mg at 10/01/11 1606  . docusate sodium (ENEMEEZ) enema 283 mg  1 enema Rectal PRN Lizbeth Bark, FNP      . feeding supplement (NEPRO CARB STEADY) liquid 237 mL  237 mL Oral TID PRN Lizbeth Bark, FNP      . fentaNYL (SUBLIMAZE) 0.05 MG/ML injection           . ferric gluconate (NULECIT) 62.5 mg in sodium chloride 0.9 % 100 mL IVPB  62.5 mg Intravenous Once Lizbeth Bark, FNP   62.5 mg at 10/02/11 0908  . ferric gluconate (NULECIT) 62.5 mg in sodium chloride 0.9 % 100 mL IVPB  62.5 mg  Intravenous Q Thu-HD Lizbeth Bark, FNP      . heparin 2-0.9 UNIT/ML-% infusion           . heparin injection 1,000 Units  1,000 Units Dialysis PRN Lizbeth Bark, FNP      . isosorbide mononitrate (IMDUR) 24 hr tablet 30 mg  30 mg Oral Daily Massie Maroon, MD   30 mg at 10/01/11 0930  . lidocaine (XYLOCAINE) 1 % injection 5 mL  5 mL Intradermal PRN Lizbeth Bark, FNP      . lidocaine (XYLOCAINE) 1 % injection           . lidocaine-prilocaine (EMLA) cream 1 application  1 application Topical PRN Lizbeth Bark, FNP      . metoprolol tartrate (LOPRESSOR) tablet 25 mg  25 mg Oral BID Eda Paschal [place], [place]   25 mg at 10/01/11 2156  . midazolam (VERSED) 2 MG/2ML injection           . multivitamin (RENA-VIT) tablet 1 tablet  1 tablet Oral Daily Massie Maroon, MD   1 tablet at 10/01/11 0930  . nitroGLYCERIN (NITROSTAT) SL tablet 0.4 mg  0.4 mg Sublingual Q5 min PRN Massie Maroon, MD      . nitroGLYCERIN (NTG ON-CALL) 0.2 mg/mL injection           . ondansetron (ZOFRAN) tablet 4 mg  4 mg Oral Q6H PRN Lizbeth Bark, FNP       Or  . ondansetron [facility]) injection 4 mg  4 mg Intravenous Q6H PRN Lizbeth Bark,  FNP      . pantoprazole (PROTONIX) EC tablet 40 mg  40 mg Oral BID Massie Maroon, MD   40 mg at 10/01/11 2155  . paricalcitol (ZEMPLAR) injection 6 mcg  6 mcg Intravenous Q T,Th,Sa-HD Lizbeth Bark, FNP      . PARoxetine (PAXIL) tablet 20 mg  20 mg Oral Daily Massie Maroon, MD   20 mg at 10/01/11 0930  . patient's guide to using coumadin book   Does not apply Once Marykay Lex, MD      . pentafluoroprop-tetrafluoroeth Peggye Pitt) aerosol 1 application  1 application Topical PRN Lizbeth Bark, FNP      . sevelamer (RENVELA) tablet 2,400 mg  2,400 mg Oral TID WC Massie Maroon, MD   2,400 mg at 10/01/11 [phone]  . sodium chloride 0.9 % injection 3 mL  3 mL Intravenous Q12H Massie Maroon, MD   3 mL at 10/01/11 2207  . sodium chloride 0.9 % injection 3 mL  3 mL Intravenous Q12H Marykay Lex, MD   3 mL at 10/02/11 0245  . sodium chloride 0.9 % injection 3 mL  3 mL Intravenous PRN Marykay Lex, MD      . sorbitol 70 % solution 30 mL  30 mL Oral PRN Lizbeth Bark, FNP      . Ticagrelor (BRILINTA) tablet 90 mg  90 mg Oral BID Massie Maroon, MD   90 mg at 10/01/11 2156  . traMADol (ULTRAM) tablet 50 mg  50 mg Oral Q6H PRN Abelino Derrick, PA      . warfarin (COUMADIN) tablet 5 mg  5 mg Oral Once Marykay Lex, MD   5 mg at 10/02/11 0400  . warfarin (COUMADIN) tablet 5 mg  5 mg Oral ONCE-1800 Hessie Diener [place], [place]      . warfarin (COUMADIN) video   Does  not apply Once Marykay Lex, MD      . Warfarin - Pharmacist Dosing Inpatient   Does not apply q1800 Marykay Lex, MD      . zolpidem [facility]) tablet 5 mg  5 mg Oral QHS PRN Abelino Derrick, PA   5 mg at 10/01/11 2155  . DISCONTD: 0.9 %  sodium chloride infusion  250 mL Intravenous PRN Abelino Derrick, PA      . DISCONTD: 0.9 %  sodium chloride infusion  100 mL Intravenous PRN Lizbeth Bark, FNP      . DISCONTD: 0.9 %  sodium chloride infusion  100 mL Intravenous PRN Lizbeth Bark, FNP      . DISCONTD: 0.9 %  sodium chloride infusion  1 mL/kg/hr Intravenous  Continuous Marykay Lex, MD 76.4 mL/hr at 10/01/11 1910 1 mL/kg/hr at 10/01/11 1910  . DISCONTD: acetaminophen (TYLENOL) tablet 650 mg  650 mg Oral Q4H PRN Abelino Derrick, PA      . DISCONTD: amiodarone (PACERONE) tablet 400 mg  400 mg Oral BID Marykay Lex, MD      . DISCONTD: aspirin chewable tablet 81 mg  81 mg Oral [address] [place], [place]      . DISCONTD: atropine 1 MG/ML injection           . DISCONTD: diltiazem (CARDIZEM) 100 mg in dextrose 5 % 100 mL infusion  5-15 mg/hr Intravenous Titrated Ward Givens, MD 5 mL/hr at 09/30/11 2326 5 mg/hr at 09/30/11 2326  . DISCONTD: diltiazem (CARDIZEM) tablet 30 mg  30 mg Oral [address] [place], [place]      . DISCONTD: enalapril (VASOTEC) tablet 10 mg  10 mg Oral QHS Massie Maroon, MD      . DISCONTD: metoprolol tartrate (LOPRESSOR) tablet 25 mg  25 mg Oral BID Massie Maroon, MD   25 mg at 10/01/11 [phone]  . DISCONTD: ondansetron (ZOFRAN) injection 4 mg  4 mg Intravenous Q6H PRN Abelino Derrick, PA      . DISCONTD: sodium chloride 0.9 % injection 3 mL  3 mL Intravenous Q12H Abelino Derrick, [place]      . DISCONTD: sodium chloride 0.9 % injection 3 mL  3 mL Intravenous PRN Abelino Derrick, PA        Physical Exam: General appearance: alert and no distress Neck: no adenopathy, no carotid bruit, no JVD, supple, symmetrical, trachea midline and thyroid not enlarged, symmetric, no tenderness/mass/nodules Lungs: clear to auscultation bilaterally Heart: regular rate and rhythm, S1, S2 normal, no murmur, click, rub or gallop Abdomen: soft, non-tender; bowel sounds normal; no masses,  no organomegaly Extremities: extremities normal, atraumatic, no cyanosis or edema Pulses: 2+ and symmetric  Lab Results: Results for orders placed during the hospital encounter of 09/30/11 (from the past 48 hour(s))  CBC     Status: Abnormal   Collection Time   09/30/11 10:56 PM      Component Value Range Comment   WBC 5.1  4.0 - 10.5 K/uL    RBC 3.07 (*) 4.22 - 5.81 MIL/uL     Hemoglobin 11.0 (*) 13.0 - 17.0 g/dL    HCT 96.0 (*) 45.4 - 52.0 %    MCV 107.5 (*) 78.0 - 100.0 fL    MCH 35.8 (*) 26.0 - 34.0 pg    MCHC 33.3  30.0 - 36.0 g/dL    RDW 09.8  11.9 - 14.7 %    Platelets 195  150 - 400 K/uL   DIFFERENTIAL     Status: Abnormal   Collection Time   09/30/11 10:56 PM      Component Value Range Comment   Neutrophils Relative 38 (*) 43 - 77 %    Neutro Abs 1.9  1.7 - 7.7 K/uL    Lymphocytes Relative 45  12 - 46 %    Lymphs Abs 2.3  0.7 - 4.0 K/uL    Monocytes Relative 9  3 - 12 %    Monocytes Absolute 0.5  0.1 - 1.0 K/uL    Eosinophils Relative 7 (*) 0 - 5 %    Eosinophils Absolute 0.3  0.0 - 0.7 K/uL    Basophils Relative 1  0 - 1 %    Basophils Absolute 0.1  0.0 - 0.1 K/uL   COMPREHENSIVE METABOLIC PANEL     Status: Abnormal   Collection Time   09/30/11 10:56 PM      Component Value Range Comment   Sodium 138  135 - 145 mEq/L    Potassium 4.2  3.5 - 5.1 mEq/L    Chloride 95 (*) 96 - 112 mEq/L    CO2 21  19 - 32 mEq/L    Glucose, Bld 122 (*) 70 - 99 mg/dL    BUN 36 (*) 6 - 23 mg/dL    Creatinine, Ser 4.54 (*) 0.50 - 1.35 mg/dL    Calcium 09.8 (*) 8.4 - 10.5 mg/dL    Total Protein 7.9  6.0 - 8.3 g/dL    Albumin 4.0  3.5 - 5.2 g/dL    AST 11  0 - 37 U/L    ALT 11  0 - 53 U/L    Alkaline Phosphatase 104  39 - 117 U/L    Total Bilirubin 0.1 (*) 0.3 - 1.2 mg/dL    GFR calc non Af Amer 5 (*) >90 mL/min    GFR calc Af Amer 6 (*) >90 mL/min   APTT     Status: Normal   Collection Time   09/30/11 10:56 PM      Component Value Range Comment   aPTT 33  24 - 37 seconds   PROTIME-INR     Status: Normal   Collection Time   09/30/11 10:56 PM      Component Value Range Comment   Prothrombin Time 14.4  11.6 - 15.2 seconds    INR 1.10  0.00 - 1.49   POCT I-STAT TROPONIN I     Status: Normal   Collection Time   09/30/11 11:33 PM      Component Value Range Comment   Troponin i, poc 0.04  0.00 - 0.08 ng/mL    Comment 3            MRSA PCR SCREENING      Status: Normal   Collection Time   10/01/11  3:27 AM      Component Value Range Comment   MRSA by PCR NEGATIVE  NEGATIVE   CBC     Status: Abnormal   Collection Time   10/01/11  3:31 AM      Component Value Range Comment   WBC 4.8  4.0 - 10.5 K/uL    RBC 2.90 (*) 4.22 - 5.81 MIL/uL    Hemoglobin 10.5 (*) 13.0 - 17.0 g/dL    HCT 11.9 (*) 14.7 - 52.0 %    MCV 109.3 (*) 78.0 - 100.0 fL    MCH 36.2 (*) 26.0 - 34.0 pg  MCHC 33.1  30.0 - 36.0 g/dL    RDW 78.2  95.6 - 21.3 %    Platelets 185  150 - 400 K/uL   COMPREHENSIVE METABOLIC PANEL     Status: Abnormal   Collection Time   10/01/11  3:31 AM      Component Value Range Comment   Sodium 140  135 - 145 mEq/L    Potassium 4.3  3.5 - 5.1 mEq/L    Chloride 99  96 - 112 mEq/L    CO2 23  19 - 32 mEq/L    Glucose, Bld 84  70 - 99 mg/dL    BUN 37 (*) 6 - 23 mg/dL    Creatinine, Ser 0.86 (*) 0.50 - 1.35 mg/dL    Calcium 57.8  8.4 - 10.5 mg/dL    Total Protein 6.7  6.0 - 8.3 g/dL    Albumin 3.7  3.5 - 5.2 g/dL    AST 10  0 - 37 U/L    ALT 9  0 - 53 U/L    Alkaline Phosphatase 88  39 - 117 U/L    Total Bilirubin 0.1 (*) 0.3 - 1.2 mg/dL    GFR calc non Af Amer 5 (*) >90 mL/min    GFR calc Af Amer 6 (*) >90 mL/min   CARDIAC PANEL(CRET KIN+CKTOT+MB+TROPI)     Status: Abnormal   Collection Time   10/01/11  3:31 AM      Component Value Range Comment   Total CK 97  7 - 232 U/L    CK, MB 5.3 (*) 0.3 - 4.0 ng/mL    Troponin I 0.78 (*) <0.30 ng/mL    Relative Index RELATIVE INDEX IS INVALID  0.0 - 2.5   TSH     Status: Normal   Collection Time   10/01/11  3:31 AM      Component Value Range Comment   TSH 1.306  0.350 - 4.500 uIU/mL   CARDIAC PANEL(CRET KIN+CKTOT+MB+TROPI)     Status: Abnormal   Collection Time   10/01/11  8:00 AM      Component Value Range Comment   Total CK 96  7 - 232 U/L    CK, MB 6.7 (*) 0.3 - 4.0 ng/mL    Troponin I 1.45 (*) <0.30 ng/mL    Relative Index RELATIVE INDEX IS INVALID  0.0 - 2.5   POCT ACTIVATED  CLOTTING TIME     Status: Normal   Collection Time   10/01/11  5:16 PM      Component Value Range Comment   Activated Clotting Time 359     CBC     Status: Abnormal   Collection Time   10/02/11  5:25 AM      Component Value Range Comment   WBC 5.9  4.0 - 10.5 K/uL    RBC 3.02 (*) 4.22 - 5.81 MIL/uL    Hemoglobin 10.7 (*) 13.0 - 17.0 g/dL    HCT 46.9 (*) 62.9 - 52.0 %    MCV 107.3 (*) 78.0 - 100.0 fL    MCH 35.4 (*) 26.0 - 34.0 pg    MCHC 33.0  30.0 - 36.0 g/dL    RDW 52.8  41.3 - 24.4 %    Platelets 194  150 - 400 K/uL   BASIC METABOLIC PANEL     Status: Abnormal   Collection Time   10/02/11  5:25 AM      Component Value Range Comment   Sodium 132 (*) 135 -  145 mEq/L    Potassium 5.5 (*) 3.5 - 5.1 mEq/L    Chloride 93 (*) 96 - 112 mEq/L    CO2 18 (*) 19 - 32 mEq/L    Glucose, Bld 107 (*) 70 - 99 mg/dL    BUN 51 (*) 6 - 23 mg/dL    Creatinine, Ser 16.10 (*) 0.50 - 1.35 mg/dL    Calcium 9.7  8.4 - 96.0 mg/dL    GFR calc non Af Amer 4 (*) >90 mL/min    GFR calc Af Amer 5 (*) >90 mL/min   PROTIME-INR     Status: Abnormal   Collection Time   10/02/11  5:25 AM      Component Value Range Comment   Prothrombin Time 15.6 (*) 11.6 - 15.2 seconds    INR 1.21  0.00 - 1.49     Imaging: No results found.  Assessment:  1. Principal Problem: 2.  *Paroxysmal atrial fibrillation, symptomatic with chest pain when in AF 3. Active Problems: 4.  ANEMIA-UNSPECIFIED 5.  NSTEMI (non-ST elevated myocardial infarction) 6.  CAD, MI CFX BMS 12/11 with ISR DES (as well as OM) May 2013 7.  HTN (hypertension) 8.  PAD (peripheral artery disease): 60% bilateral common iliac stenosis  9.  Chronic kidney failure: HD T, TH, Sat 10.  HLD (hyperlipidemia) 11.  Bradycardia when in NSR 12.  GERD (gastroesophageal reflux disease) 13.  Aortic stenosis, moderate 14.   Plan:  1. Successful PCI yesterday to LAD bifurcation. Chest pain free today. Groin is without hematoma, bruit or ecchymosis. Tolerated  dialysis today. Given the complexity of the intervention, I would recommend monitoring overnight and probable discharge in the am tomorrow.  Plan anticoagulation strategy per Dr. Elissa Hefty note yesterday.  Time Spent Directly with Patient:  15 minutes  Length of Stay:  LOS: 2 days   Chrystie Nose, MD, [facility] Attending Cardiologist [facility]  Amore Ackman C 10/02/2011, 10:27 AM

## 2011-10-02 NOTE — Progress Notes (Signed)
ANTICOAGULATION CONSULT NOTE - Follow Up Consult  Pharmacy Consult for Coumadin Indication: atrial fibrillation  No Known Allergies  Vital Signs: Temp: 98.5 F (36.9 C) (06/21 0633) Temp src: Oral (06/21 0633) BP: 138/52 mmHg (06/21 0745) Pulse Rate: 61  (06/21 0800)  Labs:  Basename 10/02/11 0525 10/01/11 0800 10/01/11 0331 09/30/11 2256  HGB 10.7* -- 10.5* --  HCT 32.4* -- 31.7* 33.0*  PLT 194 -- 185 195  APTT -- -- -- 33  LABPROT 15.6* -- -- 14.4  INR 1.21 -- -- 1.10  HEPARINUNFRC -- -- -- --  CREATININE 11.02* -- 9.17* 9.07*  CKTOTAL -- 96 97 --  CKMB -- 6.7* 5.3* --  TROPONINI -- 1.45* 0.78* --    Estimated Creatinine Clearance: 6.1 ml/min (by C-G formula based on Cr of 11.02).  Assessment: 70yom continues on coumadin for afib with a subtherapeutic INR as expected after one dose. Dosing cautiuosly as patient also on aspirin and brilinta (increased bleeding risk) and amiodarone load (can elevate INR). CBC stable.   Goal of Therapy:  INR 2-3 Monitor platelets by anticoagulation protocol: Yes   Plan:  1) Repeat coumadin 5mg  x 1 2) Follow up INR in AM 3) Follow up coumadin education (pt currently in HD)  Fredrik Rigger 10/02/2011,8:23 AM

## 2011-10-03 MED ORDER — AMIODARONE HCL 200 MG PO TABS: 400.0000 mg | ORAL_TABLET | Freq: Every day | ORAL | Status: DC

## 2011-10-03 MED ORDER — AMIODARONE HCL 200 MG PO TABS: 200.0000 mg | ORAL_TABLET | Freq: Every day | ORAL | Status: DC

## 2011-10-03 MED ORDER — WARFARIN SODIUM 5 MG PO TABS: 5.0000 mg | ORAL_TABLET | Freq: Every day | ORAL | Status: DC

## 2011-10-03 MED ORDER — NITROGLYCERIN 0.4 MG SL SUBL: 0.4000 mg | SUBLINGUAL_TABLET | SUBLINGUAL | Status: DC | PRN

## 2011-10-03 MED ORDER — AMIODARONE HCL 200 MG PO TABS: 400.0000 mg | ORAL_TABLET | Freq: Two times a day (BID) | ORAL | Status: DC

## 2011-10-03 NOTE — Discharge Instructions (Signed)
Heart Healthy Renal diet  No lifting for 3 days, no driving for 2 days.  Call [facility] if any bleeding, swelling or drainage at cath site.  May shower, no tub baths for 48 hours for groin sticks.   Continue dialysis as before.

## 2011-10-03 NOTE — Progress Notes (Signed)
[facility] Progress Note  Subjective: Anxious to go home.  Doesn't like waiting.  No CP or SOB  Objective Filed Vitals:   10/02/11 1200 10/02/11 1843 10/02/11 2053 10/03/11 0537  BP: 102/48 105/54 106/61 114/57  Pulse:  69 63 83  Temp:  100.1 F (37.8 C) 99.6 F (37.6 C) 98.4 F (36.9 C)  TempSrc:  Oral Oral Oral  Resp: 20  18 18   Height:      Weight:    78.382 kg (172 lb 12.8 oz)  SpO2:  92% 97% 95%   Physical Exam  General: NAD except somewhat irritated about having to wait for d/c paperwork Heart: RRR III/VI RUSB  Lungs:no wheezes or rales  Abdomen:soft NT  Extremities:SCDs, no LE edema  Dialysis Access: left upper AVF patent  Dialysis Orders: Center: [facility]) [place]  EDW 76.5kg HD Bath 2K/2.25Ca Time 3:15 Heparin 2,000. Access LUA AVF (placed 12/13/03) BFR 450 DFR A2.0 (800) Zemplar IV/HD Epogen None Units IV/HD Venofer 50mg  qwk (Thu)   Assessment/Plan:  1. Type II MI with known afib with RVR, known CAD, NSTEMI 5/13 with DES to CFX/OM, s/p 2 site LAD and Diagonal 2 lesions treated successfully with PTCA Of Diagonal 2 and DES PCI of the mid LAD by Dr. Herbie Baltimore. Stents placed in May widely patent. Per cards, continue dual antiplatelet therapy with aspirin and Brilinta, for minimum of one month, then d/c aspirin as the patient will be on warfarin. Warfarin therapy initiated without IV Heparin bridge-trending up. Continue oral amiodarone loading for atrial fibrillation. Asymptomatic. 2. ESRD - TTS off schedule due to heart cath - had HD Friday. Pt said Dr. [name] said he could wait until Tuesday, but I have arranged for him to go to dialysis Monday at 7 am and he is agreeable to this. 3. Anemia - Hgb stable - continue same ESA and IV Fe  4. Secondary hyperparathyroidism - Ca elevated on admission, down to 9.7 on 2 25 Ca bath 5. HTN/volume - current meds,; net UF 6/21 was 1.8 with a post wt of 78.4 - above prior EDW 6.  Nutrition - high protein, renal diet, d/c'd heart diet due to high K content 7.  Disp - for d/c today;  Will convey d/c info to his HD center.  Additional Objective Labs: Basic Metabolic Panel:  Lab 10/02/11 1610 10/01/11 0331 09/30/11 2256  NA 132* 140 138  K 5.5* 4.3 4.2  CL 93* 99 95*  CO2 18* 23 21  GLUCOSE 107* 84 122*  BUN 51* 37* 36*  CREATININE 11.02* 9.17* 9.07*  CALCIUM 9.7 10.0 10.9*  ALB -- -- --  PHOS -- -- --   Liver Function Tests:  Lab 10/01/11 0331 09/30/11 2256  AST 10 11  ALT 9 11  ALKPHOS 88 104  BILITOT 0.1* 0.1*  PROT 6.7 7.9  ALBUMIN 3.7 4.0   Lab Results  Component Value Date   INR 1.54* 10/03/2011   INR 1.21 10/02/2011   INR 1.10 09/30/2011  CBC:  Lab 10/02/11 0525 10/01/11 0331 09/30/11 2256  WBC 5.9 4.8 5.1  NEUTROABS -- -- 1.9  HGB 10.7* 10.5* 11.0*  HCT 32.4* 31.7* 33.0*  MCV 107.3* 109.3* 107.5*  PLT 194 185 195   Cardiac Enzymes:  Lab 10/01/11 0800 10/01/11 0331  CKTOTAL 96 97  CKMB 6.7* 5.3*  CKMBINDEX -- --  TROPONINI 1.45* 0.78*  Medications:    . sodium chloride        .  allopurinol  100 mg Oral BID  . amiodarone  400 mg Oral BID  . aspirin  81 mg Oral Daily  . atorvastatin  40 mg Oral q1800  . ferric gluconate (FERRLECIT/NULECIT) IV  62.5 mg Intravenous Q Thu-HD  . isosorbide mononitrate  30 mg Oral Daily  . metoprolol tartrate  25 mg Oral BID  . multivitamin  1 tablet Oral Daily  . pantoprazole  40 mg Oral BID  . paricalcitol  6 mcg Intravenous Q T,Th,Sa-HD  . PARoxetine  20 mg Oral Daily  . sevelamer  2,400 mg Oral TID WC  . sodium chloride  3 mL Intravenous Q12H  . sodium chloride  3 mL Intravenous Q12H  . Ticagrelor  90 mg Oral BID  . warfarin  5 mg Oral ONCE-1800  . warfarin   Does not apply Once  . Warfarin - Pharmacist Dosing Inpatient   Does not apply q1800    I  have reviewed scheduled and prn medications.  Sheffield Slider, PA-C [facility] Beeper [phone]  10/03/2011,10:54 AM   LOS: 3 days   Patient seen and examined and agree with assessment and plan as above.  Vinson Moselle  MD [facility] [phone] pgr    [phone] cell 10/03/2011, 12:25 PM

## 2011-10-03 NOTE — Progress Notes (Signed)
Subjective:  No CP/SOB  Objective:  Temp:  [98.4 F (36.9 C)-100.1 F (37.8 C)] 98.4 F (36.9 C) (06/22 0537) Pulse Rate:  [63-86] 83  (06/22 0537) Resp:  [18-32] 18  (06/22 0537) BP: (102-152)/(48-77) 114/57 mmHg (06/22 0537) SpO2:  [92 %-99 %] 95 % (06/22 0537) Weight:  [77 kg (169 lb 12.1 oz)-78.382 kg (172 lb 12.8 oz)] 78.382 kg (172 lb 12.8 oz) (06/22 0537) Weight change: -1.8 kg (-3 lb 15.5 oz)  Intake/Output from previous day: 06/21 0701 - 06/22 0700 In: 390 [P.O.:390] Out: 1800   Intake/Output from this shift:    Physical Exam:   Lab Results: Results for orders placed during the hospital encounter of 09/30/11 (from the past 48 hour(s))  POCT ACTIVATED CLOTTING TIME     Status: Normal   Collection Time   10/01/11  5:16 PM      Component Value Range Comment   Activated Clotting Time 359     CBC     Status: Abnormal   Collection Time   10/02/11  5:25 AM      Component Value Range Comment   WBC 5.9  4.0 - 10.5 K/uL    RBC 3.02 (*) 4.22 - 5.81 MIL/uL    Hemoglobin 10.7 (*) 13.0 - 17.0 g/dL    HCT 16.1 (*) 09.6 - 52.0 %    MCV 107.3 (*) 78.0 - 100.0 fL    MCH 35.4 (*) 26.0 - 34.0 pg    MCHC 33.0  30.0 - 36.0 g/dL    RDW 04.5  40.9 - 81.1 %    Platelets 194  150 - 400 K/uL   BASIC METABOLIC PANEL     Status: Abnormal   Collection Time   10/02/11  5:25 AM      Component Value Range Comment   Sodium 132 (*) 135 - 145 mEq/L    Potassium 5.5 (*) 3.5 - 5.1 mEq/L    Chloride 93 (*) 96 - 112 mEq/L    CO2 18 (*) 19 - 32 mEq/L    Glucose, Bld 107 (*) 70 - 99 mg/dL    BUN 51 (*) 6 - 23 mg/dL    Creatinine, Ser 91.47 (*) 0.50 - 1.35 mg/dL    Calcium 9.7  8.4 - 82.9 mg/dL    GFR calc non Af Amer 4 (*) >90 mL/min    GFR calc Af Amer 5 (*) >90 mL/min   PROTIME-INR     Status: Abnormal   Collection Time   10/02/11  5:25 AM      Component Value Range Comment   Prothrombin Time 15.6 (*) 11.6 - 15.2 seconds    INR 1.21  0.00 - 1.49     Imaging: Imaging results have  been reviewed  Assessment/Plan:   1. Principal Problem: 2.  *Paroxysmal atrial fibrillation, symptomatic with chest pain when in AF 3. Active Problems: 4.  ANEMIA-UNSPECIFIED 5.  NSTEMI (non-ST elevated myocardial infarction) 6.  CAD, MI CFX BMS 12/11 with ISR DES (as well as OM) May 2013 7.  HTN (hypertension) 8.  PAD (peripheral artery disease): 60% bilateral common iliac stenosis  9.  Chronic kidney failure: HD T, TH, Sat 10.  HLD (hyperlipidemia) 11.  Bradycardia when in NSR 12.  GERD (gastroesophageal reflux disease) 13.  Aortic stenosis, moderate 14.  S/P coronary artery stent placement, 10/01/11 with DES to mid LAD and  PTCA of Diag 2 15.   Time Spent Directly with Patient:  20 minutes  Length of  Stay:  LOS: 3 days   Looks great. Had HD yesterday. S/P complex LAD/Diag bifurcation intervention earlier in the week by Dr. [facility] with an excellent result. No CP/SOB. Maintaining NSR. Decision was appropriately made to initiate coumadin A/C for PAF. On ASA and Brilenta. Pt can be D/Cd home on low dose ASA, Brilenta BID and coumadin w/o hep bridge. HD in [place] next Tuesday. ROV with me in [place] 2-3 weeks. He can have his INRs checked in our [employer].  Runell Gess 10/03/2011, 8:03 AM

## 2011-10-06 ENCOUNTER — Encounter (HOSPITAL_COMMUNITY): Payer: Self-pay | Admitting: Cardiology

## 2011-10-06 NOTE — Discharge Summary (Signed)
Physician Discharge Summary  Patient ID: Roger [name] MRN: [id number] DOB/AGE: [birth year] 71 y.o.  Admit date: 09/30/2011 Discharge date: 10/03/2011  Discharge Diagnoses:  Principal Problem:  *Paroxysmal atrial fibrillation, symptomatic with chest pain when in AF Active Problems:  CAD, MI CFX BMS 12/11 with ISR DES (as well as OM) May 2013  S/P coronary artery stent placement, 10/01/11 with DES to mid LAD and  PTCA of Diag 2  Chronic kidney failure: HD T, TH, Sat  GERD (gastroesophageal reflux disease)  Aortic stenosis, moderate  Anticoagulated on warfarin  HTN (hypertension)  Bradycardia when in NSR  ANEMIA-UNSPECIFIED  NSTEMI (non-ST elevated myocardial infarction)  PAD (peripheral artery disease): 60% bilateral common iliac stenosis   HLD (hyperlipidemia)   Discharged Condition: good  Procedures:10/01/2011 heart catheterization by Dr. Bryan Lemma.  10/01/2011  Percutaneous Coronary Artery Intervention on mid LAD ~80% lesion with an Integrity Resolute DES 2.75 mm x 14 mm; final diameter 2.9 mm distal and 3.49mm proximal Percutaneous Coronary Balloon Angioplasty of proximal Diagonal 2 with an Sprinter Legend 2.0 mm x 15 mm - final diameter 2.15 mm.  Hospital Course: 71 y/o with a history of CAD, s/p STEMI 12/11 rx'd with CFX BMS. He had some residual LAD disease then but Myoview 1/12 was low risk. He presented May 2013 with NSTEMI and had DES to CFX/OM. The plan was to have an OP Myoview to access LAD. 09/30/11 he felt "great". He did some yard work without problems. Yesterday evening he developed abd and chest discomfort. He took antacids and NTG without relief. He took his B/P at home and noted his HR was elevated. He has had PAF in the past. He went to [facility] ER and is noted to be in rapid AF that converted with IV Diltiazem with resolution of his chest pain. His Troponin is rising, 1.45 now. He was transferred to [facility] and on arrival he was pain free and in NSR.  Concern for LAD disease  so taken to the cath lab.  10/01/11: PROCEDURES PERFORMED:  Native Coronary Angiography via 6 Fr Right Common Femoral Artery access  IC NTG Injection 200 mcg x 3  Percutaneous Coronary Artery Intervention on mid LAD ~80% lesion with an Integrity Resolute DES 2.75 mm x 14 mm; final diameter 2.9 mm distal and 3.47mm proximal  Percutaneous Coronary Balloon Angioplasty of proximal Diagonal 2 with an Sprinter Legend 2.0 mm x 15 mm - final diameter 2.15 mm  Nephrology also a was consulted for the patient with his known end-stage renal disease on hemodialysis.  On 10/02/2011 patient was stable, and cath site was without hematoma but due to the complexity of the intervention and elevation of troponin patient was kept another day. Additionally with paroxysmal atrial fibrillation Dr. Herbie Baltimore felt the patient should be on the Brilinta or aspirin and Coumadin. Coumadin was started but no cross over with Heparin.  By the next morning patient was stable and ready for discharge to home actually quite anxious for discharge. He was seen by Nephrology prior to discharge.  Plan will be to continue hemodialysis with next dialysis on Monday, June 24  He was discharged on amiodarone 400 mg twice a day with decreasing doses over the next several weeks will be followed up as an outpatient.  Consults: nephrology  Significant Diagnostic Studies:  At discharge sodium 132 potassium 5.5 chloride 93 CO2 18 BUN 51 creatinine 11.2 calcium 9.7 glucose 27  LFTs were normal  Peak troponin was 1.45 CK 96 MB 6.7  Hemoglobin 10.7 hematocrit 32.4 IVC filter 0.9 and platelets 194.  At discharge INR 1.54  TSH 1.306  EKG sinus rhythm first degree AV block incomplete right bundle branch block.  Discharge Exam: Blood pressure 114/57, pulse 83, temperature 98.4 F (36.9 C), temperature source Oral, resp. rate 18, height 5\' 8"  (1.727 m), weight 78.382 kg (172 lb 12.8 oz), SpO2 95.00%.    Disposition: 01-Home or Self  Care   Medication List  As of 10/06/2011  6:52 AM   STOP taking these medications         BC HEADACHE 325-95-16 MG Tabs      enalapril 10 MG tablet         TAKE these medications         allopurinol 100 MG tablet   Commonly known as: ZYLOPRIM   Take 100 mg by mouth 2 (two) times daily.      amiodarone 200 MG tablet   Commonly known as: PACERONE   Take 2 tablets (400 mg total) by mouth 2 (two) times daily.      amiodarone 200 MG tablet   Commonly known as: PACERONE   Take 2 tablets (400 mg total) by mouth daily.   Start taking on: 10/07/2011      amiodarone 200 MG tablet   Commonly known as: PACERONE   Take 1 tablet (200 mg total) by mouth daily.   Start taking on: 10/15/2011      aspirin 81 MG chewable tablet   Chew 81 mg by mouth every morning.      isosorbide mononitrate 30 MG 24 hr tablet   Commonly known as: IMDUR   Take 30 mg by mouth every morning.      metoprolol tartrate 25 MG tablet   Commonly known as: LOPRESSOR   Take 25 mg by mouth 2 (two) times daily.      multivitamin Tabs tablet   Take 1 tablet by mouth every morning.      nitroGLYCERIN 0.4 MG SL tablet   Commonly known as: NITROSTAT   Place 1 tablet (0.4 mg total) under the tongue every 5 (five) minutes as needed.      nitroGLYCERIN 0.4 MG SL tablet   Commonly known as: NITROSTAT   Place 1 tablet (0.4 mg total) under the tongue every 5 (five) minutes as needed for chest pain.      pantoprazole 40 MG tablet   Commonly known as: PROTONIX   Take 40 mg by mouth 2 (two) times daily.      PARoxetine 20 MG tablet   Commonly known as: PAXIL   Take 1 tablet (20 mg total) by mouth daily.      rosuvastatin 20 MG tablet   Commonly known as: CRESTOR   Take 20 mg by mouth every evening.      sevelamer 800 MG tablet   Commonly known as: RENVELA   Take 2,400 mg by mouth 3 (three) times daily with meals.      Ticagrelor 90 MG Tabs tablet   Commonly known as: BRILINTA   Take 1 tablet (90 mg total) by  mouth 2 (two) times daily.      warfarin 5 MG tablet   Commonly known as: COUMADIN   Take 1 tablet (5 mg total) by mouth daily.           Follow-up Information    Follow up with Runell Gess, MD on 10/09/2011. (at 11:45, just keep this previously made appt.  )  Contact information:   [address] [place] [address] [phone]       Follow up with Runell Gess, MD on 10/05/2011. (8:30 to check level of coumadin in the [place] office)    Contact information:   [address] [place] [address] [phone]       Follow up with [facility] (continue dialysis as before)        Discharge instructions: Heart Healthy Renal diet  No lifting for 3 days, no driving for 2 days.  Call [facility] if any bleeding, swelling or drainage at cath site.  May shower, no tub baths for 48 hours for groin sticks.   Continue dialysis as before.   SignedLeone Brand 10/06/2011, 6:52 AM

## 2011-10-09 ENCOUNTER — Other Ambulatory Visit (HOSPITAL_COMMUNITY): Payer: Self-pay | Admitting: Cardiovascular Disease

## 2011-10-09 DIAGNOSIS — R0602 Shortness of breath: Secondary | ICD-10-CM

## 2011-10-09 LAB — CBC WITH DIFFERENTIAL/PLATELET
HCT: 28 %
Hemoglobin: 9.2 g/dL — AB (ref 13.5–17.5)
WBC: 6.6

## 2011-10-12 ENCOUNTER — Ambulatory Visit (HOSPITAL_COMMUNITY)
Admission: RE | Admit: 2011-10-12 | Discharge: 2011-10-12 | Disposition: A | Discharge status: Home / Self Care | Payer: Medicare Other | Source: Ambulatory Visit | Attending: Cardiovascular Disease | Admitting: Cardiovascular Disease

## 2011-10-12 ENCOUNTER — Other Ambulatory Visit (HOSPITAL_COMMUNITY): Payer: Self-pay | Admitting: Cardiovascular Disease

## 2011-10-12 DIAGNOSIS — I10 Essential (primary) hypertension: Secondary | ICD-10-CM | POA: Insufficient documentation

## 2011-10-12 DIAGNOSIS — R0602 Shortness of breath: Secondary | ICD-10-CM

## 2011-10-14 ENCOUNTER — Emergency Department (HOSPITAL_COMMUNITY): Payer: Medicare Other

## 2011-10-14 ENCOUNTER — Encounter (HOSPITAL_COMMUNITY): Payer: Self-pay | Admitting: Emergency Medicine

## 2011-10-14 ENCOUNTER — Inpatient Hospital Stay (HOSPITAL_COMMUNITY)
Admission: EM | Admit: 2011-10-14 | Discharge: 2011-10-15 | DRG: 314 | Disposition: A | Discharge status: Home / Self Care | Payer: Medicare Other | Attending: Internal Medicine | Admitting: Internal Medicine

## 2011-10-14 DIAGNOSIS — I120 Hypertensive chronic kidney disease with stage 5 chronic kidney disease or end stage renal disease: Secondary | ICD-10-CM | POA: Diagnosis present

## 2011-10-14 DIAGNOSIS — D631 Anemia in chronic kidney disease: Secondary | ICD-10-CM

## 2011-10-14 DIAGNOSIS — Z9861 Coronary angioplasty status: Secondary | ICD-10-CM

## 2011-10-14 DIAGNOSIS — Z7901 Long term (current) use of anticoagulants: Secondary | ICD-10-CM

## 2011-10-14 DIAGNOSIS — D6832 Hemorrhagic disorder due to extrinsic circulating anticoagulants: Secondary | ICD-10-CM

## 2011-10-14 DIAGNOSIS — T45515A Adverse effect of anticoagulants, initial encounter: Secondary | ICD-10-CM | POA: Diagnosis present

## 2011-10-14 DIAGNOSIS — Z87891 Personal history of nicotine dependence: Secondary | ICD-10-CM

## 2011-10-14 DIAGNOSIS — D649 Anemia, unspecified: Secondary | ICD-10-CM

## 2011-10-14 DIAGNOSIS — K219 Gastro-esophageal reflux disease without esophagitis: Secondary | ICD-10-CM

## 2011-10-14 DIAGNOSIS — I359 Nonrheumatic aortic valve disorder, unspecified: Secondary | ICD-10-CM | POA: Diagnosis present

## 2011-10-14 DIAGNOSIS — I480 Paroxysmal atrial fibrillation: Secondary | ICD-10-CM

## 2011-10-14 DIAGNOSIS — I2510 Atherosclerotic heart disease of native coronary artery without angina pectoris: Secondary | ICD-10-CM

## 2011-10-14 DIAGNOSIS — E785 Hyperlipidemia, unspecified: Secondary | ICD-10-CM

## 2011-10-14 DIAGNOSIS — R001 Bradycardia, unspecified: Secondary | ICD-10-CM

## 2011-10-14 DIAGNOSIS — M1990 Unspecified osteoarthritis, unspecified site: Secondary | ICD-10-CM

## 2011-10-14 DIAGNOSIS — N189 Chronic kidney disease, unspecified: Secondary | ICD-10-CM

## 2011-10-14 DIAGNOSIS — R58 Hemorrhage, not elsewhere classified: Secondary | ICD-10-CM

## 2011-10-14 DIAGNOSIS — N186 End stage renal disease: Secondary | ICD-10-CM | POA: Diagnosis present

## 2011-10-14 DIAGNOSIS — D62 Acute posthemorrhagic anemia: Secondary | ICD-10-CM

## 2011-10-14 DIAGNOSIS — T82898A Other specified complication of vascular prosthetic devices, implants and grafts, initial encounter: Principal | ICD-10-CM | POA: Diagnosis present

## 2011-10-14 DIAGNOSIS — I214 Non-ST elevation (NSTEMI) myocardial infarction: Secondary | ICD-10-CM

## 2011-10-14 DIAGNOSIS — I4891 Unspecified atrial fibrillation: Secondary | ICD-10-CM | POA: Diagnosis present

## 2011-10-14 DIAGNOSIS — D689 Coagulation defect, unspecified: Secondary | ICD-10-CM

## 2011-10-14 DIAGNOSIS — Z992 Dependence on renal dialysis: Secondary | ICD-10-CM

## 2011-10-14 DIAGNOSIS — I252 Old myocardial infarction: Secondary | ICD-10-CM

## 2011-10-14 DIAGNOSIS — I739 Peripheral vascular disease, unspecified: Secondary | ICD-10-CM

## 2011-10-14 DIAGNOSIS — R1319 Other dysphagia: Secondary | ICD-10-CM

## 2011-10-14 DIAGNOSIS — R791 Abnormal coagulation profile: Secondary | ICD-10-CM | POA: Diagnosis present

## 2011-10-14 DIAGNOSIS — T82838A Hemorrhage of vascular prosthetic devices, implants and grafts, initial encounter: Secondary | ICD-10-CM

## 2011-10-14 DIAGNOSIS — I10 Essential (primary) hypertension: Secondary | ICD-10-CM

## 2011-10-14 DIAGNOSIS — I350 Nonrheumatic aortic (valve) stenosis: Secondary | ICD-10-CM

## 2011-10-14 DIAGNOSIS — Z8249 Family history of ischemic heart disease and other diseases of the circulatory system: Secondary | ICD-10-CM

## 2011-10-14 DIAGNOSIS — Z955 Presence of coronary angioplasty implant and graft: Secondary | ICD-10-CM

## 2011-10-14 LAB — CBC WITH DIFFERENTIAL/PLATELET
Basophils Absolute: 0.1 10*3/uL (ref 0.0–0.1)
Lymphocytes Relative: 23 % (ref 12–46)
Lymphs Abs: 1.3 10*3/uL (ref 0.7–4.0)
MCV: 109.1 fL — ABNORMAL HIGH (ref 78.0–100.0)
Neutro Abs: 3.3 10*3/uL (ref 1.7–7.7)
Neutrophils Relative %: 57 % (ref 43–77)
Platelets: 183 10*3/uL (ref 150–400)
RBC: 2.32 MIL/uL — ABNORMAL LOW (ref 4.22–5.81)
RDW: 14 % (ref 11.5–15.5)
WBC: 5.7 10*3/uL (ref 4.0–10.5)

## 2011-10-14 LAB — BASIC METABOLIC PANEL
CO2: 27 mEq/L (ref 19–32)
Glucose, Bld: 163 mg/dL — ABNORMAL HIGH (ref 70–99)
Potassium: 4.4 mEq/L (ref 3.5–5.1)
Sodium: 138 mEq/L (ref 135–145)

## 2011-10-14 LAB — SAMPLE TO BLOOD BANK

## 2011-10-14 LAB — PROTIME-INR
INR: 7 (ref 0.00–1.49)
Prothrombin Time: 61.3 seconds — ABNORMAL HIGH (ref 11.6–15.2)

## 2011-10-14 LAB — APTT: aPTT: 167 seconds — ABNORMAL HIGH (ref 24–37)

## 2011-10-14 LAB — TROPONIN I: Troponin I: <0.3 ng/mL (ref <0.30)

## 2011-10-14 MED ORDER — VITAMIN K1 10 MG/ML IJ SOLN
5.0000 mg | Freq: Once | INTRAMUSCULAR | Status: AC
  Administered 2011-10-14: 5 mg via SUBCUTANEOUS
  Filled 2011-10-14: qty 1

## 2011-10-14 MED ORDER — ATORVASTATIN CALCIUM 40 MG PO TABS
40.0000 mg | ORAL_TABLET | Freq: Every day | ORAL | Status: DC
  Administered 2011-10-14 – 2011-10-15 (×2): 40 mg via ORAL
  Filled 2011-10-14 (×2): qty 1

## 2011-10-14 MED ORDER — "THROMBI-PAD 3""X3"" EX PADS"
MEDICATED_PAD | CUTANEOUS | Status: AC
  Filled 2011-10-14: qty 2

## 2011-10-14 MED ORDER — HYDROCODONE-ACETAMINOPHEN 5-325 MG PO TABS
1.0000 | ORAL_TABLET | ORAL | Status: DC | PRN
Start: 2011-10-14 — End: 2011-10-16
  Administered 2011-10-14: 1 via ORAL
  Filled 2011-10-14: qty 1

## 2011-10-14 MED ORDER — ANTIINHIBITOR COAGULANT CMPLX IV SOLR: 500.0000 [IU] | Freq: Once | INTRAVENOUS | Status: DC | PRN

## 2011-10-14 MED ORDER — "THROMBI-PAD 3""X3"" EX PADS"
1.0000 | MEDICATED_PAD | Freq: Once | CUTANEOUS | Status: AC
  Administered 2011-10-14: 1 via TOPICAL

## 2011-10-14 MED ORDER — NITROGLYCERIN 0.4 MG SL SUBL: 0.4000 mg | SUBLINGUAL_TABLET | SUBLINGUAL | Status: DC | PRN

## 2011-10-14 MED ORDER — ONDANSETRON HCL 4 MG PO TABS: 4.0000 mg | ORAL_TABLET | Freq: Four times a day (QID) | ORAL | Status: DC | PRN

## 2011-10-14 MED ORDER — PANTOPRAZOLE SODIUM 40 MG PO TBEC
40.0000 mg | DELAYED_RELEASE_TABLET | Freq: Two times a day (BID) | ORAL | Status: DC
  Administered 2011-10-14 – 2011-10-15 (×4): 40 mg via ORAL
  Filled 2011-10-14 (×4): qty 1

## 2011-10-14 MED ORDER — SEVELAMER CARBONATE 800 MG PO TABS
2400.0000 mg | ORAL_TABLET | Freq: Three times a day (TID) | ORAL | Status: DC
  Administered 2011-10-14 – 2011-10-15 (×3): 2400 mg via ORAL
  Filled 2011-10-14 (×3): qty 3

## 2011-10-14 MED ORDER — ONDANSETRON HCL 4 MG/2ML IJ SOLN: 4.0000 mg | Freq: Four times a day (QID) | INTRAMUSCULAR | Status: DC | PRN

## 2011-10-14 MED ORDER — SODIUM CHLORIDE 0.9 % IJ SOLN
3.0000 mL | Freq: Two times a day (BID) | INTRAMUSCULAR | Status: DC
  Administered 2011-10-14 (×2): 3 mL via INTRAVENOUS
  Filled 2011-10-14 (×2): qty 3

## 2011-10-14 MED ORDER — METOPROLOL TARTRATE 25 MG PO TABS
25.0000 mg | ORAL_TABLET | Freq: Two times a day (BID) | ORAL | Status: DC
  Administered 2011-10-14 – 2011-10-15 (×3): 25 mg via ORAL
  Filled 2011-10-14 (×4): qty 1

## 2011-10-14 MED ORDER — PROTAMINE SULFATE 10 MG/ML IV SOLN
50.0000 mg | Freq: Once | INTRAVENOUS | Status: DC
  Filled 2011-10-14: qty 5

## 2011-10-14 MED ORDER — ISOSORBIDE MONONITRATE ER 60 MG PO TB24
30.0000 mg | ORAL_TABLET | ORAL | Status: DC
  Administered 2011-10-15: 30 mg via ORAL
  Filled 2011-10-14: qty 1

## 2011-10-14 MED ORDER — AMIODARONE HCL 200 MG PO TABS
200.0000 mg | ORAL_TABLET | Freq: Every day | ORAL | Status: DC
  Administered 2011-10-14 – 2011-10-15 (×2): 200 mg via ORAL
  Filled 2011-10-14 (×2): qty 1

## 2011-10-14 MED ORDER — ANTIINHIBITOR COAGULANT CMPLX IV SOLR
1000.0000 [IU] | INTRAVENOUS | Status: DC
  Filled 2011-10-14: qty 1

## 2011-10-14 MED ORDER — TICAGRELOR 90 MG PO TABS
90.0000 mg | ORAL_TABLET | Freq: Two times a day (BID) | ORAL | Status: DC
  Administered 2011-10-14 – 2011-10-15 (×4): 90 mg via ORAL
  Filled 2011-10-14 (×5): qty 1

## 2011-10-14 MED ORDER — ACETAMINOPHEN 650 MG RE SUPP: 650.0000 mg | Freq: Four times a day (QID) | RECTAL | Status: DC | PRN

## 2011-10-14 MED ORDER — ACETAMINOPHEN 325 MG PO TABS: 650.0000 mg | ORAL_TABLET | Freq: Four times a day (QID) | ORAL | Status: DC | PRN

## 2011-10-14 MED ORDER — SENNA 8.6 MG PO TABS: 1.0000 | ORAL_TABLET | Freq: Every day | ORAL | Status: DC | PRN

## 2011-10-14 MED ORDER — PAROXETINE HCL 20 MG PO TABS
20.0000 mg | ORAL_TABLET | Freq: Every day | ORAL | Status: DC
  Administered 2011-10-14 – 2011-10-15 (×2): 20 mg via ORAL
  Filled 2011-10-14 (×2): qty 1

## 2011-10-14 MED ORDER — RENA-VITE PO TABS
1.0000 | ORAL_TABLET | ORAL | Status: DC
  Administered 2011-10-15: 1 via ORAL
  Filled 2011-10-14 (×3): qty 1

## 2011-10-14 MED ORDER — ALLOPURINOL 100 MG PO TABS
50.0000 mg | ORAL_TABLET | Freq: Two times a day (BID) | ORAL | Status: DC
  Administered 2011-10-14 – 2011-10-15 (×4): 50 mg via ORAL
  Filled 2011-10-14 (×4): qty 1

## 2011-10-14 NOTE — ED Notes (Signed)
Pt states had dialysis yesterday. Bleeding was controlld when he left dialysis at 10am. Started bleeding and has oozed since. Pt has bandage on site at present time.

## 2011-10-14 NOTE — ED Provider Notes (Signed)
History     CSN: [id number]  Arrival date & time 10/14/11  [id number]   First MD Initiated Contact with Patient 10/14/11 [phone]      Chief Complaint  Patient presents with  . Bleeding/Bruising    bleeding from dialysis shunt    (Consider location/radiation/quality/duration/timing/severity/associated sxs/prior treatment) HPI  Patient relates he has been on dialysis for about 4 years. He had dialysis yesterday and got finished about 10 AM and states he had no bleeding. He states about noon he started having bleeding from the more superior site and since then has had a constant oozing of blood. He relates he's put pressure on it and had to change his gauze dressings multiple times. He denies chest pain, lightheadedness or feeling dizzy. However he does report having shortness of breath today which started about 2 weeks ago. He also reports he was recently started on Coumadin  about 2 weeks ago and his last INR 2 days ago was 2.3 and his Coumadin dose was decreased. He also reports that at  his dialysis yesterday his hemoglobin had dropped to 9.5.  PCP Dr. Megan Mans Cardiologist Dr. Gery Pray Nephrology is Dr. Briant Cedar  Past Medical History  Diagnosis Date  . HTN (hypertension)   . Arthritis   . Hypercholesteremia   . Kidney failure     secondary to HTN  . MI (myocardial infarction)   . GERD (gastroesophageal reflux disease)   . Atrial fibrillation   . Gout   . S/P coronary artery stent placement, 10/01/11 with DES to mid LAD and  PTCA of Diag 2 10/02/2011  . Anticoagulated on warfarin 10/06/2011  . Dialysis patient     Past Surgical History  Procedure Date  . Ureteral stent placement   . Colonoscopy 04/2009    with EGD by Dr.Buccini moderately large hiatal hernia could contribute to his anemia,Schatzki ring which might accout for his intermittent dysphagia/+internal hemorrhoids ;family hx of colon ca  . Esophagogastroduodenoscopy 03/2010    by Dr.Magod/small hiatal hernia with widley patent  fibrous ring,minimal bulbitis otherwise nl  . Dialysis fistula creation   . Coronary angioplasty with stent placement 08/2011    Dr. Herbie Baltimore  . Coronary angioplasty with stent placement 2011    [name]    Family History  Problem Relation Age of Onset  . Coronary artery disease Father   . Hypertension Mother   . Coronary artery disease Mother     History  Substance Use Topics  . Smoking status: Former Smoker -- 0.5 packs/day for 45 years    Types: Cigarettes    Quit date: 04/13/1992  . Smokeless tobacco: Not on file  . Alcohol Use: No   Lives with spouse   Review of Systems  All other systems reviewed and are negative.    Allergies  Review of patient's allergies indicates no known allergies.  Home Medications   Current Outpatient Rx  Name Route Sig Dispense Refill  . ALLOPURINOL 100 MG PO TABS Oral Take 50 mg by mouth 2 (two) times daily.     . ISOSORBIDE MONONITRATE ER 30 MG PO TB24 Oral Take 30 mg by mouth every morning.     [name] METOPROLOL TARTRATE 25 MG PO TABS Oral Take 25 mg by mouth 2 (two) times daily.    [name] RENA-VITE PO TABS Oral Take 1 tablet by mouth every morning.     [name] PANTOPRAZOLE SODIUM 40 MG PO TBEC Oral Take 40 mg by mouth 2 (two) times daily.     [name]  PAROXETINE HCL 20 MG PO TABS Oral Take 1 tablet (20 mg total) by mouth daily.    [name] ROSUVASTATIN CALCIUM 20 MG PO TABS Oral Take 20 mg by mouth every evening.    [name] SEVELAMER CARBONATE 800 MG PO TABS Oral Take 2,400 mg by mouth 3 (three) times daily with meals.    [name] TICAGRELOR 90 MG PO TABS Oral Take 1 tablet (90 mg total) by mouth 2 (two) times daily. 60 tablet 10  . WARFARIN SODIUM 5 MG PO TABS Oral Take 5 mg by mouth daily.    . AMIODARONE HCL 200 MG PO TABS Oral Take 200 mg by mouth daily.    [name] NITROGLYCERIN 0.4 MG SL SUBL Sublingual Place 1 tablet (0.4 mg total) under the tongue every 5 (five) minutes as needed. 25 tablet 5    BP 144/72  Pulse 60  Temp 98.9 F (37.2 C) (Oral)  Resp 20  Ht 5\' 9"   (1.753 m)  Wt 160 lb (72.576 kg)  BMI 23.63 kg/m2  SpO2 97%  Vital signs normal    Physical Exam  Constitutional: He is oriented to person, place, and time. He appears well-developed and well-nourished.  Non-toxic appearance. He does not appear ill. No distress.  HENT:  Head: Normocephalic and atraumatic.  Right Ear: External ear normal.  Left Ear: External ear normal.  Nose: Nose normal. No mucosal edema or rhinorrhea.  Mouth/Throat: Oropharynx is clear and moist and mucous membranes are normal. No dental abscesses or uvula swelling.  Eyes: Conjunctivae and EOM are normal. Pupils are equal, round, and reactive to light.  Neck: Normal range of motion and full passive range of motion without pain. Neck supple.  Cardiovascular: Normal rate and regular rhythm.  Exam reveals no gallop and no friction rub.   Murmur heard.      Pt has harsh systolic murmer  Pulmonary/Chest: Effort normal and breath sounds normal. No respiratory distress. He has no wheezes. He has no rhonchi. He has no rales. He exhibits no tenderness and no crepitus.  Abdominal: Soft. Normal appearance and bowel sounds are normal. He exhibits no distension. There is no tenderness. There is no rebound and no guarding.  Musculoskeletal: Normal range of motion. He exhibits no edema and no tenderness.       Moves all extremities well.   Patient has a bloodsoaked gauze on his left upper arm with a small trickle of bleeding from underneath the gauze. The dressing was removed he has a small pinpoint area that has a constant oozing of blood. Pressure was applied by myself while nursing staff obtained a thrombi pad. thrombi pad was placed on the area with folded 4x4s and pressure held by myself for about 7 or 8 minutes. He then had a coban wrap placed with 4x4s folded into 2 inch squares to hold pressure on the area. The lower site has a small drop of blood that is not actively bleeding. He still has palpable bruits.  Neurological: He is  alert and oriented to person, place, and time. He has normal strength. No cranial nerve deficit.  Skin: Skin is warm, dry and intact. No rash noted. No erythema. There is pallor.  Psychiatric: He has a normal mood and affect. His speech is normal and behavior is normal. His mood appears not anxious.    ED Course  Procedures (including critical care time)   Medications  Thrombi-Pad (THROMBI-PAD) 3"X3" pad 1 each (0  Topical Duplicate 10/14/11 0917)  phytonadione (VITAMIN K) SQ  injection 5 mg (5 mg Subcutaneous Given 10/14/11 1030)   Recheck shows no bleeding through the pressure dressing.   10:34 Dr Lendell Caprice, admit to telemetry, observation she will write orders  No PRC's, give  Vit K and follow coumadin reversal protocol.    Results for orders placed during the hospital encounter of 10/14/11  APTT      Component Value Range   aPTT 167 (*) 24 - 37 seconds  PROTIME-INR      Component Value Range   Prothrombin Time 61.3 (*) 11.6 - 15.2 seconds   INR 7.00 (*) 0.00 - 1.49  CBC WITH DIFFERENTIAL      Component Value Range   WBC 5.7  4.0 - 10.5 K/uL   RBC 2.32 (*) 4.22 - 5.81 MIL/uL   Hemoglobin 8.2 (*) 13.0 - 17.0 g/dL   HCT 16.1 (*) 09.6 - 04.5 %   MCV 109.1 (*) 78.0 - 100.0 fL   MCH 35.3 (*) 26.0 - 34.0 pg   MCHC 32.4  30.0 - 36.0 g/dL   RDW 40.9  81.1 - 91.4 %   Platelets 183  150 - 400 K/uL   Neutrophils Relative 57  43 - 77 %   Neutro Abs 3.3  1.7 - 7.7 K/uL   Lymphocytes Relative 23  12 - 46 %   Lymphs Abs 1.3  0.7 - 4.0 K/uL   Monocytes Relative 9  3 - 12 %   Monocytes Absolute 0.5  0.1 - 1.0 K/uL   Eosinophils Relative 11 (*) 0 - 5 %   Eosinophils Absolute 0.6  0.0 - 0.7 K/uL   Basophils Relative 1  0 - 1 %   Basophils Absolute 0.1  0.0 - 0.1 K/uL  BASIC METABOLIC PANEL      Component Value Range   Sodium 138  135 - 145 mEq/L   Potassium 4.4  3.5 - 5.1 mEq/L   Chloride 94 (*) 96 - 112 mEq/L   CO2 27  19 - 32 mEq/L   Glucose, Bld 163 (*) 70 - 99 mg/dL   BUN 34 (*) 6  - 23 mg/dL   Creatinine, Ser 7.82 (*) 0.50 - 1.35 mg/dL   Calcium 95.6  8.4 - 21.3 mg/dL   GFR calc non Af Amer 7 (*) >90 mL/min   GFR calc Af Amer 9 (*) >90 mL/min  TROPONIN I      Component Value Range   Troponin I <0.30  <0.30 ng/mL  SAMPLE TO BLOOD BANK      Component Value Range   Blood Bank Specimen SAMPLE AVAILABLE FOR TESTING     Sample Expiration 10/17/2011    PREPARE RBC (CROSSMATCH)      Component Value Range   Order Confirmation ORDER PROCESSED BY BLOOD BANK    TYPE AND SCREEN      Component Value Range   ABO/RH(D) O POS     Antibody Screen POS     Sample Expiration 10/17/2011     Laboratory interpretation all normal except overtherapeutic INR/PTT, renal failure  Dg Chest Port 1 View  10/14/2011  *RADIOLOGY REPORT*  Clinical Data: Short of breath.  Hypertension.  Myocardial infarction.  Coronary stents.  PORTABLE CHEST - 1 VIEW  Comparison: 10/12/2011  Findings: Heart size is normal.  The aorta shows atherosclerosis and unfolding.  Lungs are clear.  The vascularity is normal.  No effusions.  No bony abnormalities.  IMPRESSION: Aortic atherosclerosis.  No active disease.  Original Report Authenticated By: Janeece Riggers  Karin Golden, M.D.       Date: 10/14/2011  Rate: 59  Rhythm: sinus bradycardia  QRS Axis: normal  Intervals: QT prolonged  ST/T Wave abnormalities: normal  Conduction Disutrbances:none  Narrative Interpretation:   Old EKG Reviewed: unchanged from 10/01/2011     1. Bleeding due to dialysis catheter placement   2. Anemia   3. Coagulopathy   4. Acute blood loss anemia    Plan admission  CRITICAL CARE Performed by: Devoria Albe L   Total critical care time: 31 min  Critical care time was exclusive of separately billable procedures and treating other patients.  Critical care was necessary to treat or prevent imminent or life-threatening deterioration.  Critical care was time spent personally by me on the following activities: development of treatment  plan with patient and/or surrogate as well as nursing, discussions with consultants, evaluation of patient's response to treatment, examination of patient, obtaining history from patient or surrogate, ordering and performing treatments and interventions, ordering and review of laboratory studies, ordering and review of radiographic studies, pulse oximetry and re-evaluation of patient's condition.    MDM          Ward Givens, MD 10/14/11 [phone]

## 2011-10-14 NOTE — ED Notes (Signed)
Patient does not need anything at this time. 

## 2011-10-14 NOTE — H&P (Addendum)
Hospital Admission Note Date: 10/14/2011  Patient name: Roger [name] Medical record number: [id number] Date of birth: 1940/08/12 Age: 71 y.o. Gender: male PCP: [name], MD  Attending physician: Christiane Ha, MD  Chief Complaint: Bleeding from AV fistula  History of Present Illness:  Roger [name] is an 71 y.o. male who presents with bleeding from his left arm AV fistula since yesterday. He had dialysis yesterday morning. Since then he has had constant oozing from the site. He is on Coumadin and ticagrelor. He thinks he may have also received heparin in dialysis but this is not confirmed. His INR today was found to be 7. Hemoglobin was found to be 8.2. It had previously been 9.5-10. He reports he had an INR check a few days ago and it was 2.3. His Coumadin dose was decreased however. He's had no other change in medication or diet. His blood pressure is not low. He has no chest pain or shortness of breath. He's had no bleeding elsewhere. A thrombipad and dressing has been placed on the fistula and it is no longer bleeding. Primary care physician is Dr. Megan Mans who is away on vacation so the hospitalist have been asked to admit.  Past Medical History  Diagnosis Date  . HTN (hypertension)   . Arthritis   . Hypercholesteremia   . Kidney failure     secondary to HTN  . MI (myocardial infarction)   . GERD (gastroesophageal reflux disease)   . Atrial fibrillation   . Gout   . S/P coronary artery stent placement, 10/01/11 with DES to mid LAD and  PTCA of Diag 2 10/02/2011  . Anticoagulated on warfarin 10/06/2011  . Dialysis patient     Meds: Prescriptions prior to admission  Medication Sig Dispense Refill  . allopurinol (ZYLOPRIM) 100 MG tablet Take 50 mg by mouth 2 (two) times daily.       . isosorbide mononitrate (IMDUR) 30 MG 24 hr tablet Take 30 mg by mouth every morning.       . metoprolol tartrate (LOPRESSOR) 25 MG tablet Take 25 mg by mouth 2 (two) times daily.      .  multivitamin (RENA-VIT) TABS tablet Take 1 tablet by mouth every morning.       . pantoprazole (PROTONIX) 40 MG tablet Take 40 mg by mouth 2 (two) times daily.       [name] PARoxetine (PAXIL) 20 MG tablet Take 1 tablet (20 mg total) by mouth daily.      . rosuvastatin (CRESTOR) 20 MG tablet Take 20 mg by mouth every evening.      . sevelamer (RENVELA) 800 MG tablet Take 2,400 mg by mouth 3 (three) times daily with meals.      . Ticagrelor (BRILINTA) 90 MG TABS tablet Take 1 tablet (90 mg total) by mouth 2 (two) times daily.  60 tablet  10  . warfarin (COUMADIN) 5 MG tablet Take 5 mg by mouth daily.      [name] amiodarone (PACERONE) 200 MG tablet Take 200 mg by mouth daily.      . nitroGLYCERIN (NITROSTAT) 0.4 MG SL tablet Place 1 tablet (0.4 mg total) under the tongue every 5 (five) minutes as needed.  25 tablet  5    Allergies: Review of patient's allergies indicates no known allergies. History   Social History  . Marital Status: Married    Spouse Name: N/A    Number of Children: N/A  . Years of Education: N/A   Occupational  History  . Not on file.   Social History Main Topics  . Smoking status: Former Smoker -- 0.5 packs/day for 45 years    Types: Cigarettes    Quit date: 04/13/1992  . Smokeless tobacco: Not on file  . Alcohol Use: No  . Drug Use: No  . Sexually Active: No   Other Topics Concern  . 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    Family History  Problem Relation Age of Onset  . Coronary artery disease Father   . Hypertension Mother   . Coronary artery disease Mother    Past Surgical History  Procedure Date  . Ureteral stent placement   . Colonoscopy 04/2009    with EGD by Dr.Buccini moderately large hiatal hernia could contribute to his anemia,Schatzki ring which might accout for his intermittent dysphagia/+internal hemorrhoids ;family hx of colon ca  . Esophagogastroduodenoscopy 03/2010    by Dr.Magod/small  hiatal hernia with widley patent fibrous ring,minimal bulbitis otherwise nl  . Dialysis fistula creation   . Coronary angioplasty with stent placement 08/2011    Dr. Herbie Baltimore  . Coronary angioplasty with stent placement 2011    [name]    Review of Systems: Systems reviewed and as per HPI, otherwise negative.  Physical Exam: Blood pressure 120/54, pulse 57, temperature 98.9 F (37.2 C), temperature source Oral, resp. rate 16, height 5\' 9"  (1.753 m), weight 72.576 kg (160 lb), SpO2 100.00%. BP 120/54  Pulse 57  Temp 98.9 F (37.2 C) (Oral)  Resp 16  Ht 5\' 9"  (1.753 m)  Wt 72.576 kg (160 lb)  BMI 23.63 kg/m2  SpO2 100%  General Appearance:    Alert, cooperative, no distress, appears stated age  Head:    Normocephalic, without obvious abnormality, atraumatic  Eyes:    PERRL, conjunctiva/corneas clear, EOM's intact, fundi    benign, both eyes          Nose:   Nares normal, septum midline, mucosa normal, no drainage    or sinus tenderness  Throat:   Lips, mucosa, and tongue normal; teeth and gums normal  Neck:   Supple, symmetrical, trachea midline, no adenopathy;       thyroid:  No enlargement/tenderness/nodules; no carotid   bruit or JVD  Back:     Symmetric, no curvature, ROM normal, no CVA tenderness  Lungs:     Clear to auscultation bilaterally, respirations unlabored  Chest wall:    No tenderness or deformity  Heart:    Regular rate and rhythm, S1 and S2 normal, systolic murmur, rub   or gallop  Abdomen:     Soft, non-tender, bowel sounds active all four quadrants,    no masses, no organomegaly  Genitalia:   deferred   Rectal:   deferred   Extremities:   Extremities normal, atraumatic, no cyanosis or edema: Left arm with a dressing which is clean dry and intact.   Pulses:   2+ and symmetric all extremities  Skin:   Skin color, texture, turgor normal, no rashes or lesions  Lymph nodes:   Cervical, supraclavicular, and axillary nodes normal  Neurologic:   CNII-XII  intact. Normal strength, sensation and reflexes      throughout    Psychiatric: Normal affect  Lab results: Basic Metabolic Panel:  Basename 10/14/11 0921  NA 138  K 4.4  CL 94*  CO2 27  GLUCOSE 163*  BUN 34*  CREATININE 6.78*  CALCIUM  10.2  MG --  PHOS --   Liver Function Tests: No results found for this basename: AST:2,ALT:2,ALKPHOS:2,BILITOT:2,PROT:2,ALBUMIN:2 in the last 72 hours No results found for this basename: LIPASE:2,AMYLASE:2 in the last 72 hours No results found for this basename: AMMONIA:2 in the last 72 hours CBC:  Basename 10/14/11 0921  WBC 5.7  NEUTROABS 3.3  HGB 8.2*  HCT 25.3*  MCV 109.1*  PLT 183   Cardiac Enzymes:  Basename 10/14/11 0921  CKTOTAL --  CKMB --  CKMBINDEX --  TROPONINI <0.30   BNP: No results found for this basename: PROBNP:3 in the last 72 hours D-Dimer: No results found for this basename: DDIMER:2 in the last 72 hours CBG: No results found for this basename: GLUCAP:6 in the last 72 hours Hemoglobin A1C: No results found for this basename: HGBA1C in the last 72 hours Fasting Lipid Panel: No results found for this basename: CHOL,HDL,LDLCALC,TRIG,CHOLHDL,LDLDIRECT in the last 72 hours Thyroid Function Tests: No results found for this basename: TSH,T4TOTAL,FREET4,T3FREE,THYROIDAB in the last 72 hours Anemia Panel: No results found for this basename: VITAMINB12,FOLATE,FERRITIN,TIBC,IRON,RETICCTPCT in the last 72 hours Coagulation:  Basename 10/14/11 0921  LABPROT 61.3*  INR 7.00*   Imaging results:  Dg Chest Port 1 View  10/14/2011  *RADIOLOGY REPORT*  Clinical Data: Short of breath.  Hypertension.  Myocardial infarction.  Coronary stents.  PORTABLE CHEST - 1 VIEW  Comparison: 10/12/2011  Findings: Heart size is normal.  The aorta shows atherosclerosis and unfolding.  Lungs are clear.  The vascularity is normal.  No effusions.  No bony abnormalities.  IMPRESSION: Aortic atherosclerosis.  No active disease.  Original Report  Authenticated By: Thomasenia Sales, M.D.   EKG shows sinus bradycardia with a rate of 59. Prolonged QT  Assessment & Plan: Active Problems:  Acute blood loss anemia in the setting of chronic anemia.  Bleeding from AV fistula seems to have resolved  Warfarin-induced coagulopathy  CAD, MI CFX BMS 12/11 with ISR DES (as well as OM) May 2013  HTN (hypertension)  Chronic kidney failure: HD T, TH, Sat  Aortic stenosis, moderate  PAF (paroxysmal atrial fibrillation)  PAD (peripheral artery disease): 60% bilateral common iliac stenosis   HLD (hyperlipidemia)  GERD (gastroesophageal reflux disease)  Anemia of chronic renal failure  As the bleeding has stopped, and patient is asymptomatic with a normal blood pressure. Will hold off on transfusion of blood products for now, especially given his history of end-stage renal disease. He makes no urine. If his blood pressure drops her hemoglobin drops further, or the bleeding were to resume, will transfuse FFP and/or packed red blood cells. He has received vitamin K. Hold Coumadin. Continue ticagrelor.  Patient has macrocytic indices. He had iron, ferritin and TIBC measured within the past few months. Will check at B12 and folate as well.  Dr. Kristian Covey has been consulted for dialysis tomorrow.  Avory Rahimi L 10/14/2011, 1:30 PM

## 2011-10-14 NOTE — Consult Note (Signed)
Reason for Consult: End-stage renal disease Referring Physician: Dr. Fredric Dine is an 71 y.o. male.  HPI: He is a patient past history of hypertension end-stage renal disease on maintenance hemodialysis for the last 4 years presently had came with complaints of her bleeding from his fistula. According to her the patient was found to have her irregular heart beat and because of that he started on Coumadin about 3 weeks ago. He went to his hemodialysis yesterday and the after he went to his fistula site continues to ooze blood.. Since he was not able to stop it he decided to come to the emergency room. At this moment patient states that he might have received heparin yesterday. He complains of some weakness otherwise overall feels good.  Past Medical History  Diagnosis Date  . HTN (hypertension)   . Arthritis   . Hypercholesteremia   . Kidney failure     secondary to HTN  . MI (myocardial infarction)   . GERD (gastroesophageal reflux disease)   . Atrial fibrillation   . Gout   . S/P coronary artery stent placement, 10/01/11 with DES to mid LAD and  PTCA of Diag 2 10/02/2011  . Anticoagulated on warfarin 10/06/2011  . Dialysis patient     Past Surgical History  Procedure Date  . Ureteral stent placement   . Colonoscopy 04/2009    with EGD by Dr.Buccini moderately large hiatal hernia could contribute to his anemia,Schatzki ring which might accout for his intermittent dysphagia/+internal hemorrhoids ;family hx of colon ca  . Esophagogastroduodenoscopy 03/2010    by Dr.Magod/small hiatal hernia with widley patent fibrous ring,minimal bulbitis otherwise nl  . Dialysis fistula creation   . Coronary angioplasty with stent placement 08/2011    Dr. Herbie Baltimore  . Coronary angioplasty with stent placement 2011    [name]    Family History  Problem Relation Age of Onset  . Coronary artery disease Father   . Hypertension Mother   . Coronary artery disease Mother     Social  History:  reports that he quit smoking about 19 years ago. His smoking use included Cigarettes. He has a 22.5 pack-year smoking history. He does not have any smokeless tobacco history on file. He reports that he does not drink alcohol or use illicit drugs.  Allergies: No Known Allergies  Medications: I have reviewed the patient's current medications.  Results for orders placed during the hospital encounter of 10/14/11 (from the past 48 hour(s))  APTT     Status: Abnormal   Collection Time   10/14/11  9:21 AM      Component Value Range Comment   aPTT 167 (*) 24 - 37 seconds   PROTIME-INR     Status: Abnormal   Collection Time   10/14/11  9:21 AM      Component Value Range Comment   Prothrombin Time 61.3 (*) 11.6 - 15.2 seconds    INR 7.00 (*) 0.00 - 1.49   CBC WITH DIFFERENTIAL     Status: Abnormal   Collection Time   10/14/11  9:21 AM      Component Value Range Comment   WBC 5.7  4.0 - 10.5 K/uL    RBC 2.32 (*) 4.22 - 5.81 MIL/uL    Hemoglobin 8.2 (*) 13.0 - 17.0 g/dL    HCT 19.1 (*) 47.8 - 52.0 %    MCV 109.1 (*) 78.0 - 100.0 fL    MCH 35.3 (*) 26.0 - 34.0 pg  MCHC 32.4  30.0 - 36.0 g/dL    RDW 16.1  09.6 - 04.5 %    Platelets 183  150 - 400 K/uL    Neutrophils Relative 57  43 - 77 %    Neutro Abs 3.3  1.7 - 7.7 K/uL    Lymphocytes Relative 23  12 - 46 %    Lymphs Abs 1.3  0.7 - 4.0 K/uL    Monocytes Relative 9  3 - 12 %    Monocytes Absolute 0.5  0.1 - 1.0 K/uL    Eosinophils Relative 11 (*) 0 - 5 %    Eosinophils Absolute 0.6  0.0 - 0.7 K/uL    Basophils Relative 1  0 - 1 %    Basophils Absolute 0.1  0.0 - 0.1 K/uL   BASIC METABOLIC PANEL     Status: Abnormal   Collection Time   10/14/11  9:21 AM      Component Value Range Comment   Sodium 138  135 - 145 mEq/L    Potassium 4.4  3.5 - 5.1 mEq/L    Chloride 94 (*) 96 - 112 mEq/L    CO2 27  19 - 32 mEq/L    Glucose, Bld 163 (*) 70 - 99 mg/dL    BUN 34 (*) 6 - 23 mg/dL    Creatinine, Ser 4.09 (*) 0.50 - 1.35 mg/dL     Calcium 81.1  8.4 - 10.5 mg/dL    GFR calc non Af Amer 7 (*) >90 mL/min    GFR calc Af Amer 9 (*) >90 mL/min   TROPONIN I     Status: Normal   Collection Time   10/14/11  9:21 AM      Component Value Range Comment   Troponin I <0.30  <0.30 ng/mL   SAMPLE TO BLOOD BANK     Status: Normal   Collection Time   10/14/11  9:21 AM      Component Value Range Comment   Blood Bank Specimen SAMPLE AVAILABLE FOR TESTING      Sample Expiration 10/17/2011       Dg Chest Port 1 View  10/14/2011  *RADIOLOGY REPORT*  Clinical Data: Short of breath.  Hypertension.  Myocardial infarction.  Coronary stents.  PORTABLE CHEST - 1 VIEW  Comparison: 10/12/2011  Findings: Heart size is normal.  The aorta shows atherosclerosis and unfolding.  Lungs are clear.  The vascularity is normal.  No effusions.  No bony abnormalities.  IMPRESSION: Aortic atherosclerosis.  No active disease.  Original Report Authenticated By: Thomasenia Sales, M.D.    Review of Systems  Constitutional: Negative for fever.  Respiratory: Negative for cough and shortness of breath.   Cardiovascular: Negative for chest pain, orthopnea and leg swelling.  Gastrointestinal: Negative for nausea and vomiting.  Neurological: Positive for weakness.   Blood pressure 120/54, pulse 57, temperature 98.9 F (37.2 C), temperature source Oral, resp. rate 16, height 5\' 9"  (1.753 m), weight 72.576 kg (160 lb), SpO2 100.00%. Physical Exam  Constitutional: He is oriented to person, place, and time. No distress.  Eyes: No scleral icterus.  Neck: Neck supple. No JVD present.  Cardiovascular: Normal rate and regular rhythm.  Exam reveals no gallop and no friction rub.   Murmur heard. Respiratory: No respiratory distress. He has no wheezes.  Musculoskeletal: He exhibits no edema.  Neurological: He is alert and oriented to person, place, and time.    Assessment/Plan: Problem #1 end-stage renal disease is status post hemodialysis  yesterday his BUN and creatinine  is stable with normal potassium. Problem #2 history of coronary artery disease status post stent placement is a symptomatic Problem #3 aortic stenosis moderate Problem #4 anemia most likely secondary to her blood loss and also end-stage renal disease. Problem #5 history of hypertension Problem #6 history of a trial fibrillation his heart rate is controlled Problem #7 peripheral vascular disease. Plan: Will dialyze patient tomorrow and I will hold his heparin. We'll follow his blood work including phosphorus tomorrow.  Naasia Weilbacher S 10/14/2011, 11:04 AM

## 2011-10-14 NOTE — ED Notes (Signed)
CRITICAL VALUE ALERT  Critical value received:  INR 7.0  Date of notification:  10/14/11  Time of notification:  1006  Critical value read back:yes  Nurse who received alert:  Eustace Quail RN  MD notified (1st page):  Dr. Lynelle Doctor  Time of first page:  1007  MD notified (2nd page):  Time of second page:  Responding MD:    Time MD responded:

## 2011-10-14 NOTE — ED Notes (Signed)
Family at bedside. 

## 2011-10-14 NOTE — ED Notes (Addendum)
Kathlene November from Pharmacy called about protamine- will have to have [facility] send medication- not stocked at [facility]. Dr. Lars Mage made aware  Called nurse back moments later,  Medication located and delivered to patient.  MD decided not to use medication.  Mady Gemma, PHARMD. 10/14/2011

## 2011-10-15 LAB — CBC
HCT: 23 % — ABNORMAL LOW (ref 39.0–52.0)
MCH: 35.5 pg — ABNORMAL HIGH (ref 26.0–34.0)
MCV: 107.5 fL — ABNORMAL HIGH (ref 78.0–100.0)
RBC: 2.14 MIL/uL — ABNORMAL LOW (ref 4.22–5.81)
RDW: 13.9 % (ref 11.5–15.5)
WBC: 6.5 10*3/uL (ref 4.0–10.5)

## 2011-10-15 LAB — BASIC METABOLIC PANEL
BUN: 51 mg/dL — ABNORMAL HIGH (ref 6–23)
CO2: 26 mEq/L (ref 19–32)
Calcium: 10 mg/dL (ref 8.4–10.5)
Chloride: 93 mEq/L — ABNORMAL LOW (ref 96–112)
Creatinine, Ser: 8.84 mg/dL — ABNORMAL HIGH (ref 0.50–1.35)

## 2011-10-15 LAB — PHOSPHORUS: Phosphorus: 5 mg/dL — ABNORMAL HIGH (ref 2.3–4.6)

## 2011-10-15 MED ORDER — EPOETIN ALFA 10000 UNIT/ML IJ SOLN
10000.0000 [IU] | INTRAMUSCULAR | Status: DC
  Administered 2011-10-15: 10000 [IU] via SUBCUTANEOUS
  Filled 2011-10-15: qty 1

## 2011-10-15 MED ORDER — WARFARIN SODIUM 5 MG PO TABS: 2.5000 mg | ORAL_TABLET | Freq: Every day | ORAL | Status: DC

## 2011-10-15 NOTE — Progress Notes (Signed)
Called blood bank to check on status of blood.  Blood bank stated that blood was now in the building and would be ready to be transfused in the next 30 minutes. Will continue to monitor status of blood.  Sch[place], Candelaria Stagers 10/15/2011

## 2011-10-15 NOTE — Progress Notes (Signed)
hgb 7.6. INR 1.4. BP stable.  Will transfuse 1 unit PRBC

## 2011-10-15 NOTE — Discharge Summary (Signed)
Physician Discharge Summary  Patient ID: Roger [name] MRN: [id number] DOB/AGE: 14-Sep-1940 71 y.o.  Admit date: 10/14/2011 Discharge date: 10/15/2011  Discharge Diagnoses:  Active Problems:  Acute blood loss anemia  Bleeding  Warfarin-induced coagulopathy  CAD, MI CFX BMS 12/11 with ISR DES (as well as OM) May 2013  HTN (hypertension)  Chronic kidney failure: HD T, TH, Sat  Aortic stenosis, moderate  PAF (paroxysmal atrial fibrillation)  PAD (peripheral artery disease): 60% bilateral common iliac stenosis   HLD (hyperlipidemia)  GERD (gastroesophageal reflux disease)  Anemia of chronic renal failure   Medication List  As of 10/15/2011  5:01 PM   TAKE these medications         allopurinol 100 MG tablet   Commonly known as: ZYLOPRIM   Take 50 mg by mouth 2 (two) times daily.      amiodarone 200 MG tablet   Commonly known as: PACERONE   Take 200 mg by mouth daily.      isosorbide mononitrate 30 MG 24 hr tablet   Commonly known as: IMDUR   Take 30 mg by mouth every morning.      metoprolol tartrate 25 MG tablet   Commonly known as: LOPRESSOR   Take 25 mg by mouth 2 (two) times daily.      multivitamin Tabs tablet   Take 1 tablet by mouth every morning.      nitroGLYCERIN 0.4 MG SL tablet   Commonly known as: NITROSTAT   Place 1 tablet (0.4 mg total) under the tongue every 5 (five) minutes as needed.      pantoprazole 40 MG tablet   Commonly known as: PROTONIX   Take 40 mg by mouth 2 (two) times daily.      PARoxetine 20 MG tablet   Commonly known as: PAXIL   Take 1 tablet (20 mg total) by mouth daily.      rosuvastatin 20 MG tablet   Commonly known as: CRESTOR   Take 20 mg by mouth every evening.      sevelamer 800 MG tablet   Commonly known as: RENVELA   Take 2,400 mg by mouth 3 (three) times daily with meals.      Ticagrelor 90 MG Tabs tablet   Commonly known as: BRILINTA   Take 1 tablet (90 mg total) by mouth 2 (two) times daily.      warfarin 5 MG tablet     Commonly known as: COUMADIN   Take 0.5 tablets (2.5 mg total) by mouth daily.            Discharge Orders    Future Appointments: Provider: Department: Dept Phone: Center:   10/21/2011 2:30 PM [facility]  None     Future Orders Please Complete By Expires   Diet - low sodium heart healthy      Increase activity slowly         Disposition: 01-Home or Self Care  Discharged Condition: stable  Consults: Treatment Team:  Jamse Mead, MD  Labs:   Results for orders placed during the hospital encounter of 10/14/11 (from the past 48 hour(s))  APTT     Status: Abnormal   Collection Time   10/14/11  9:21 AM      Component Value Range Comment   aPTT 167 (*) 24 - 37 seconds   PROTIME-INR     Status: Abnormal   Collection Time   10/14/11  9:21 AM      Component Value Range  Comment   Prothrombin Time 61.3 (*) 11.6 - 15.2 seconds    INR 7.00 (*) 0.00 - 1.49   CBC WITH DIFFERENTIAL     Status: Abnormal   Collection Time   10/14/11  9:21 AM      Component Value Range Comment   WBC 5.7  4.0 - 10.5 K/uL    RBC 2.32 (*) 4.22 - 5.81 MIL/uL    Hemoglobin 8.2 (*) 13.0 - 17.0 g/dL    HCT 69.6 (*) 29.5 - 52.0 %    MCV 109.1 (*) 78.0 - 100.0 fL    MCH 35.3 (*) 26.0 - 34.0 pg    MCHC 32.4  30.0 - 36.0 g/dL    RDW 28.4  13.2 - 44.0 %    Platelets 183  150 - 400 K/uL    Neutrophils Relative 57  43 - 77 %    Neutro Abs 3.3  1.7 - 7.7 K/uL    Lymphocytes Relative 23  12 - 46 %    Lymphs Abs 1.3  0.7 - 4.0 K/uL    Monocytes Relative 9  3 - 12 %    Monocytes Absolute 0.5  0.1 - 1.0 K/uL    Eosinophils Relative 11 (*) 0 - 5 %    Eosinophils Absolute 0.6  0.0 - 0.7 K/uL    Basophils Relative 1  0 - 1 %    Basophils Absolute 0.1  0.0 - 0.1 K/uL   BASIC METABOLIC PANEL     Status: Abnormal   Collection Time   10/14/11  9:21 AM      Component Value Range Comment   Sodium 138  135 - 145 mEq/L    Potassium 4.4  3.5 - 5.1 mEq/L    Chloride 94 (*) 96 - 112 mEq/L     CO2 27  19 - 32 mEq/L    Glucose, Bld 163 (*) 70 - 99 mg/dL    BUN 34 (*) 6 - 23 mg/dL    Creatinine, Ser 1.02 (*) 0.50 - 1.35 mg/dL    Calcium 72.5  8.4 - 10.5 mg/dL    GFR calc non Af Amer 7 (*) >90 mL/min    GFR calc Af Amer 9 (*) >90 mL/min   TROPONIN I     Status: Normal   Collection Time   10/14/11  9:21 AM      Component Value Range Comment   Troponin I <0.30  <0.30 ng/mL   SAMPLE TO BLOOD BANK     Status: Normal   Collection Time   10/14/11  9:21 AM      Component Value Range Comment   Blood Bank Specimen SAMPLE AVAILABLE FOR TESTING      Sample Expiration 10/17/2011     PREPARE RBC (CROSSMATCH)     Status: Normal   Collection Time   10/14/11  9:25 AM      Component Value Range Comment   Order Confirmation ORDER PROCESSED BY BLOOD BANK     TYPE AND SCREEN     Status: Normal (Preliminary result)   Collection Time   10/14/11  9:25 AM      Component Value Range Comment   ABO/RH(D) O POS      Antibody Screen POS      Sample Expiration 10/17/2011      Antibody Identification ANTI-K ANTI-Jka (KIDD a)      PT AG Type POSITIVE FOR E ANTIGEN      DAT, IgG NEG  Unit Number [id number]      Blood Component Type RED CELLS,LR      Unit division 00      Status of Unit IN-TRANSIT      Transfusion Status OK TO TRANSFUSE      Crossmatch Result COMPATIBLE      Donor AG Type        Value: NEGATIVE FOR KELL ANTIGEN NEGATIVE FOR KIDD A ANTIGEN   Unit Number [id number]      Blood Component Type RED CELLS,LR      Unit division 00      Status of Unit IN-TRANSIT      Transfusion Status OK TO TRANSFUSE      Crossmatch Result COMPATIBLE     VITAMIN B12     Status: Normal   Collection Time   10/14/11  2:00 PM      Component Value Range Comment   Vitamin B-12 463  211 - 911 pg/mL   CBC     Status: Abnormal   Collection Time   10/15/11  6:25 AM      Component Value Range Comment   WBC 6.5  4.0 - 10.5 K/uL    RBC 2.14 (*) 4.22 - 5.81 MIL/uL    Hemoglobin 7.6 (*) 13.0 - 17.0 g/dL    HCT  62.9 (*) 52.8 - 52.0 %    MCV 107.5 (*) 78.0 - 100.0 fL    MCH 35.5 (*) 26.0 - 34.0 pg    MCHC 33.0  30.0 - 36.0 g/dL    RDW 41.3  24.4 - 01.0 %    Platelets 185  150 - 400 K/uL   BASIC METABOLIC PANEL     Status: Abnormal   Collection Time   10/15/11  6:25 AM      Component Value Range Comment   Sodium 137  135 - 145 mEq/L    Potassium 5.4 (*) 3.5 - 5.1 mEq/L DELTA CHECK NOTED   Chloride 93 (*) 96 - 112 mEq/L    CO2 26  19 - 32 mEq/L    Glucose, Bld 101 (*) 70 - 99 mg/dL    BUN 51 (*) 6 - 23 mg/dL    Creatinine, Ser 2.72 (*) 0.50 - 1.35 mg/dL    Calcium 53.6  8.4 - 10.5 mg/dL    GFR calc non Af Amer 5 (*) >90 mL/min    GFR calc Af Amer 6 (*) >90 mL/min   PROTIME-INR     Status: Abnormal   Collection Time   10/15/11  6:25 AM      Component Value Range Comment   Prothrombin Time 17.4 (*) 11.6 - 15.2 seconds    INR 1.40  0.00 - 1.49   APTT     Status: Abnormal   Collection Time   10/15/11  6:25 AM      Component Value Range Comment   aPTT 62 (*) 24 - 37 seconds   PHOSPHORUS     Status: Abnormal   Collection Time   10/15/11  6:25 AM      Component Value Range Comment   Phosphorus 5.0 (*) 2.3 - 4.6 mg/dL     Diagnostics:  Dg Chest 2 View  10/12/2011  *RADIOLOGY REPORT*  Clinical Data: Shortness of breath, history hypertension, stents  CHEST - 2 VIEW  Comparison: 08/21/2011  Findings: Upper-normal size of cardiac silhouette. Calcified tortuous aorta. Pulmonary vascularity normal. Chronic bronchitic changes. Coronary arterial stent noted. No infiltrate, pleural effusion or pneumothorax. Bones appear diffusely  demineralized.  IMPRESSION: Chronic bronchitic changes.  Original Report Authenticated By: Lollie Marrow, M.D.   Dg Chest Port 1 View  10/14/2011  *RADIOLOGY REPORT*  Clinical Data: Short of breath.  Hypertension.  Myocardial infarction.  Coronary stents.  PORTABLE CHEST - 1 VIEW  Comparison: 10/12/2011  Findings: Heart size is normal.  The aorta shows atherosclerosis and unfolding.   Lungs are clear.  The vascularity is normal.  No effusions.  No bony abnormalities.  IMPRESSION: Aortic atherosclerosis.  No active disease.  Original Report Authenticated By: Thomasenia Sales, M.D.   Full Code   Hospital Course: See H&P for complete admission details. Mr. Cuttino is a 71 year old white male on Coumadin and Ticagrelor who presented with bleeding from his AV fistula for over 12 hours. His INR was found to be 7. His hemoglobin initially was about 8.2. In the emergency room, he was given vitamin K, and a from the patch was placed on his AV fistula. The bleeding stopped. He was monitored overnight. His hemoglobin upon repeat was 7.6. Because of his recent MI, and multiple other medical problems, he was transfused one unit of packed red blood cells. He received dialysis. He's had no further bleeding. He's had no other complaints and his other medical problems have been stable. I recommend decreasing his Coumadin dose to 2-1/2 mg daily. He had been taking 5 mg daily last week, but it was recently decreased to 5/alternating 2.5. He has an appointment with [facility] for INR check next week. His primary care physician is [name] who is away on vacation. Total time on the day of discharge greater than 30 minutes.  Discharge Exam:  Blood pressure 108/43, pulse 57, temperature 98.3 F (36.8 C), temperature source Oral, resp. rate 20, height 5\' 9"  (1.753 m), weight 75 kg (165 lb 5.5 oz), SpO2 95.00%.  Exam unchanged from 10/14/2011. AV fistula without bleeding.  SignedChristiane Ha 10/15/2011, 5:01 PM

## 2011-10-15 NOTE — Progress Notes (Signed)
UR Chart Review Completed  

## 2011-10-15 NOTE — Progress Notes (Signed)
Called blood bank to check status of blood.  Margaret from lab stated it was going to be "several hours" before we received the blood from [facility].  Will continue to follow-up.  Schonewitz, Candelaria Stagers 10/15/2011

## 2011-10-15 NOTE — Progress Notes (Signed)
Pt received 1 unit of PRBC, ending at 2218. VS have remained stable. IV was discontinued. Site looked good, catheter whole, bleeding stopped. Discharge papers printed and explained. Pt and wife verbalized understanding of discharge instructions and have signed discharge papers. Pt escorted down in wheelchair by staff.

## 2011-10-15 NOTE — Progress Notes (Signed)
MD ordered to transfuse 1 unit PRBC this am, blood bank called to report that blood would have to be ordered from [facility] due to antibodies.  Will continue to follow-up.  Schonewitz, Candelaria Stagers 10/15/2011

## 2011-10-15 NOTE — Progress Notes (Signed)
Outpatient plan of care on chart / reviewed. Pt resting comfortably with no distress

## 2011-10-15 NOTE — Progress Notes (Signed)
Subjective: Interval History: has complaints weakness. Patient denies any nausea or vomiting appetite is good. He'll have any difficulty in breathing. Patient bleeding from his fistula site has stopped..  Objective: Vital signs in last 24 hours: Temp:  [97.6 F (36.4 C)-98.6 F (37 C)] 98.6 F (37 C) (07/04 0526) Pulse Rate:  [53-59] 54  (07/04 0526) Resp:  [16-19] 19  (07/04 0526) BP: (116-159)/(45-73) 135/65 mmHg (07/04 0526) SpO2:  [95 %-100 %] 97 % (07/04 0526) Weight:  [76.1 kg (167 lb 12.3 oz)] 76.1 kg (167 lb 12.3 oz) (07/04 0526) Weight change:   Intake/Output from previous day: 07/03 0701 - 07/04 0700 In: 240 [P.O.:240] Out: -  Intake/Output this shift:    General appearance: alert, cooperative and no distress Resp: clear to auscultation bilaterally Cardio: regular rate and rhythm, S1, S2 normal, no murmur, click, rub or gallop GI: soft, non-tender; bowel sounds normal; no masses,  no organomegaly Extremities: extremities normal, atraumatic, no cyanosis or edema  Lab Results:  [facility] 10/15/11 0625 10/14/11 0921  WBC 6.5 5.7  HGB 7.6* 8.2*  HCT 23.0* 25.3*  PLT 185 183   BMET:  Basename 10/15/11 0625 10/14/11 0921  NA 137 138  K 5.4* 4.4  CL 93* 94*  CO2 26 27  GLUCOSE 101* 163*  BUN 51* 34*  CREATININE 8.84* 6.78*  CALCIUM 10.0 10.2   No results found for this basename: PTH:2 in the last 72 hours Iron Studies: No results found for this basename: IRON,TIBC,TRANSFERRIN,FERRITIN in the last 72 hours  Studies/Results: Dg Chest Port 1 View  10/14/2011  *RADIOLOGY REPORT*  Clinical Data: Short of breath.  Hypertension.  Myocardial infarction.  Coronary stents.  PORTABLE CHEST - 1 VIEW  Comparison: 10/12/2011  Findings: Heart size is normal.  The aorta shows atherosclerosis and unfolding.  Lungs are clear.  The vascularity is normal.  No effusions.  No bony abnormalities.  IMPRESSION: Aortic atherosclerosis.  No active disease.  Original Report Authenticated  By: Thomasenia Sales, M.D.    I have reviewed the patient's current medications.  Assessment/Plan: Problem #1 end-stage renal disease patient is status post hemodialysis on Tuesday his BUN is 31 creatinine is 8.84 potassium is 5.4 and patient is due for dialysis today. Problem #2 anemia this secondary her to bleeding his hemoglobin is 7.6 hematocrit was2 3 declining presently complaints of her weakness. Problem #3 hypertension his blood pressure seems to be reasonably controlled Problem #4 history of moderate aortic stenosis patient is a symptomatic Problem #5 history of hypercoagulable state patient was on Coumadin and that was stopped presently his INR is 1.4 and patient does not have any bleeding. Problem #6 history of a trial fibrillation his heart rate is controlled Problem #7 history of coronary artery disease status post stent placement a symptomatic. Problem #8 history of hyperphosphatemia mild. Plan: We'll do dialysis today  We'll continue his Epogen 10,000 units IV after each dialysis We'll hold heparin.     LOS: 1 day   Koleen Celia S 10/15/2011,8:57 AM

## 2011-10-16 LAB — TYPE AND SCREEN
ABO/RH(D): O POS
Antibody Screen: POSITIVE
Donor AG Type: NEGATIVE
PT AG Type: POSITIVE
Unit division: 0

## 2011-10-20 NOTE — Progress Notes (Signed)
UR Chart Review Completed  

## 2011-10-21 ENCOUNTER — Other Ambulatory Visit (HOSPITAL_COMMUNITY): Payer: Self-pay | Admitting: Radiology

## 2011-10-21 ENCOUNTER — Ambulatory Visit (HOSPITAL_COMMUNITY)
Admission: RE | Admit: 2011-10-21 | Discharge: 2011-10-21 | Disposition: A | Discharge status: Home / Self Care | Payer: Medicare Other | Source: Ambulatory Visit | Attending: Cardiovascular Disease | Admitting: Cardiovascular Disease

## 2011-10-21 DIAGNOSIS — R0602 Shortness of breath: Secondary | ICD-10-CM | POA: Insufficient documentation

## 2011-10-21 LAB — PULMONARY FUNCTION TEST

## 2011-10-21 MED ORDER — ALBUTEROL SULFATE (5 MG/ML) 0.5% IN NEBU
2.5000 mg | INHALATION_SOLUTION | Freq: Once | RESPIRATORY_TRACT | Status: AC
  Administered 2011-10-21: 2.5 mg via RESPIRATORY_TRACT

## 2011-11-04 ENCOUNTER — Encounter (HOSPITAL_COMMUNITY)
Admission: RE | Admit: 2011-11-04 | Discharge: 2011-11-04 | Disposition: A | Discharge status: Home / Self Care | Payer: Medicare Other | Source: Ambulatory Visit | Attending: Nephrology | Admitting: Nephrology

## 2011-11-04 DIAGNOSIS — D649 Anemia, unspecified: Secondary | ICD-10-CM | POA: Insufficient documentation

## 2011-11-04 LAB — PREPARE RBC (CROSSMATCH)

## 2011-11-04 NOTE — Progress Notes (Signed)
1100 Lungs clear with equal breath sounds bilaterally.  O2 saturation 94%

## 2011-11-05 LAB — TYPE AND SCREEN
ABO/RH(D): O POS
Antibody Screen: POSITIVE
DAT, IgG: NEGATIVE
Unit division: 0

## 2011-11-06 LAB — CBC WITH DIFFERENTIAL/PLATELET
Albumin: 4.2
Alkaline Phosphatase: 92 U/L
Bicarbonate: 22
Calcium: 9.4 mg/dL
Creat: 8.4
Hemoglobin: 10.5 g/dL — AB (ref 13.5–17.5)
WBC: 4.04
platelet count: 163

## 2011-11-13 ENCOUNTER — Ambulatory Visit (INDEPENDENT_AMBULATORY_CARE_PROVIDER_SITE_OTHER): Payer: Medicare Other | Admitting: Urgent Care

## 2011-11-13 ENCOUNTER — Encounter: Payer: Self-pay | Admitting: Urgent Care

## 2011-11-13 VITALS — BP 156/69 | HR 56 | Temp 98.0°F | Ht 68.0 in | Wt 164.0 lb

## 2011-11-13 DIAGNOSIS — D649 Anemia, unspecified: Secondary | ICD-10-CM

## 2011-11-13 NOTE — Patient Instructions (Addendum)
Hemoccult stools with 3 cards & return to our office as soon as possible Please have kidney center fax CBC to me Monday To ER if chest pain, dizziness, bleeding or trouble breathing

## 2011-11-13 NOTE — Progress Notes (Signed)
Primary Care Physician:  [name], MD Primary Gastroenterologist:  Dr. Jena Gauss  Chief Complaint  Patient presents with  . hemoglobin low   HPI:  Roger [name] is a 71 y.o. male here for anemia & hemoccult positive stool.  He has hx CKD on dialysis & GI bleed in setting of asa/plavix & NSAIDs in 2011.  Previous EGD/colonoscpy done in [place].  Givens done here at that time w/ SB erosions but no active bleeding. PUD on EGD.  Admitted to [facility] 7/3-7/4 for anemia, single unit PRBCs, vitamin K due to AV fistula bleed in setting of coagulopathy.  INR was 7.  He was on ticagrelor too.  Last week received another 2 units PRBCs when hgb dropped to 8.  Now 10.5post-transfusion.  c/o SOB on Brilinta so he stopped the medication himself.  He saw Dr [name] who just changed his dose of coumadin & changed brilinta to effient.  He was instructed to take 60mg  today then 10mg  daily.  He was found to have heme positive [facility] when he returned 3 cards.   He is receiving epogen injections & venofer at [facility].  Denies any upper GI symptoms including heartburn, indigestion, nausea, vomiting, dysphagia, odynophagia or anorexia.  Denies any lower GI symptoms including constipation, diarrhea, rectal bleeding, melena or weight loss. SOBOE & laying flat.  Denies diziness or palpitations.  On holter monitor now.  No free bleeding noted recently including epistaxis or from AV fistula site.  Labs 11/06/11:WBC 4.04, Hgb 10.5, Hct 33, plts 163.  Creatinine 8.4,phos 5.3.Ferritin 1098.  Iron, UIBC, TIBC, trans sat normal.   Past Medical History  Diagnosis Date  . HTN (hypertension)   . Arthritis   . Hypercholesteremia   . Kidney failure     secondary to HTN  . MI (myocardial infarction)   . GERD (gastroesophageal reflux disease)   . Atrial fibrillation   . Gout   . S/P coronary artery stent placement, 10/01/11 with DES to mid LAD and  PTCA of Diag 2 10/02/2011  . Anticoagulated on warfarin 10/06/2011   . Dialysis patient     Past Surgical History  Procedure Date  . Ureteral stent placement   . Colonoscopy 04/2009    with EGD by Dr.Buccini moderately large hiatal hernia could contribute to his anemia,Schatzki ring which might accout for his intermittent dysphagia/+internal hemorrhoids ;family hx of colon ca  . Esophagogastroduodenoscopy 03/2010    by Dr.Magod/small hiatal hernia with widley patent fibrous ring,minimal bulbitis otherwise nl  . Dialysis fistula creation   . Coronary angioplasty with stent placement 08/2011    Dr. Herbie Baltimore  . Coronary angioplasty with stent placement 2011    [name]    Current Outpatient Prescriptions  Medication Sig Dispense Refill  . allopurinol (ZYLOPRIM) 100 MG tablet Take 50 mg by mouth 2 (two) times daily.       [name] amiodarone (PACERONE) 200 MG tablet Take 200 mg by mouth daily.      . isosorbide mononitrate (IMDUR) 30 MG 24 hr tablet Take 30 mg by mouth every morning.       . metoprolol tartrate (LOPRESSOR) 25 MG tablet Take 25 mg by mouth 2 (two) times daily.      . multivitamin (RENA-VIT) TABS tablet Take 1 tablet by mouth every morning.       . nitroGLYCERIN (NITROSTAT) 0.4 MG SL tablet Place 1 tablet (0.4 mg total) under the tongue every 5 (five) minutes as needed.  25 tablet  5  . pantoprazole (PROTONIX) 40 MG tablet Take 40 mg by mouth 2 (two) times daily.       [name] PARoxetine (PAXIL) 20 MG tablet Take 1 tablet (20 mg total) by mouth daily.      . rosuvastatin (CRESTOR) 20 MG tablet Take 20 mg by mouth every evening.      . sevelamer (RENVELA) 800 MG tablet Take 2,400 mg by mouth 3 (three) times daily with meals.      . Ticagrelor (BRILINTA) 90 MG TABS tablet Take 1 tablet (90 mg total) by mouth 2 (two) times daily.  60 tablet  10  . warfarin (COUMADIN) 5 MG tablet Take 0.5 tablets (2.5 mg total) by mouth daily.        Allergies as of 11/13/2011  . (No Known Allergies)    Family History:There is no known family history of colorectal  carcinoma , liver disease, or inflammatory bowel disease.   Problem Relation Age of Onset  . Coronary artery disease Father   . Hypertension Mother   . Coronary artery disease Mother     History   Social History  . Marital Status: Married    Spouse Name: N/A    Number of Children: N/A  . Years of Education: N/A   Occupational History  . Not on file.   Social History Main Topics  . Smoking status: Former Smoker -- 0.5 packs/day for 45 years    Types: Cigarettes    Quit date: 04/13/1992  . Smokeless tobacco: Not on file  . Alcohol Use: No  . Drug Use: No  . Sexually Active: No   Other Topics Concern  . 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     Review of Systems: Gen: Denies any fever, chills, sweats, anorexia, fatigue, weakness, malaise, weight loss, and sleep disorder CV: Denies chest pain, angina, palpitations, syncope, orthopnea, PND, peripheral edema, and claudication. Resp: Denies dyspnea at rest, dyspnea with exercise, cough, sputum, wheezing, coughing up blood, and pleurisy. GI: Denies vomiting blood, jaundice, and fecal incontinence.   Denies dysphagia or odynophagia. GU : Denies urinary burning, blood in urine, urinary frequency, urinary hesitancy, nocturnal urination, and urinary incontinence. MS: Denies joint pain, limitation of movement, and swelling, stiffness, low back pain, extremity pain. Denies muscle weakness, cramps, atrophy.  Derm: Denies rash, itching, dry skin, hives, moles, warts, or unhealing ulcers.  Psych: Denies depression, anxiety, memory loss, suicidal ideation, hallucinations, paranoia, and confusion. Heme: Denies bruising, bleeding, and enlarged lymph nodes. Neuro:  Denies any headaches, dizziness, paresthesias. Endo:  Denies any problems with DM, thyroid, adrenal function.  Physical Exam: BP 156/69  Pulse 56  Temp 98 F (36.7 C) (Temporal)  Ht 5\' 8"  (1.727 m)  Wt 164 lb (74.39  kg)  BMI 24.94 kg/m2 No LMP for male patient. General:   Alert,  Well-developed, well-nourished, pleasant and cooperative in NAD. Accompanied by his wife. Head:  Normocephalic and atraumatic. Eyes:  Sclera clear, no icterus.   Conjunctiva pink. Ears:  Normal auditory acuity. Nose:  No deformity, discharge, or lesions. Mouth:  No deformity or lesions,oropharynx pink & moist. Neck:  Supple; no masses or thyromegaly. Lungs:  Clear throughout to auscultation.   No wheezes, crackles, or rhonchi. No acute distress. Heart:  Regular rate and rhythm  5/6 murmur noted.   No clicks, rubs,  or gallops. Abdomen:  Normal bowel sounds.  No bruits.  Soft, non-tender and non-distended  without masses, hepatosplenomegaly or hernias noted.  No guarding or rebound tenderness.   Rectal:  No internal/external lesions noted. Hemoccult  Negative stool obtained from vault.   Msk:  Symmetrical without gross deformities. Normal posture. Pulses:  Normal pulses noted. Extremities:  No clubbing or edema.  Right upper arm with dressing at site AV fistula. Neurologic:  Alert and  oriented x4;  grossly normal neurologically. Skin:  Intact without significant lesions or rashes. Lymph Nodes:  No significant cervical adenopathy. Psych:  Alert and cooperative. Normal mood and affect.

## 2011-11-14 ENCOUNTER — Encounter: Payer: Self-pay | Admitting: Urgent Care

## 2011-11-14 LAB — CBC WITH DIFFERENTIAL/PLATELET
Basophils Absolute: 0 10*3/uL (ref 0.0–0.1)
Basophils Relative: 0 % (ref 0–1)
Hemoglobin: 11.2 g/dL — ABNORMAL LOW (ref 13.0–17.0)
MCHC: 32 g/dL (ref 30.0–36.0)
Monocytes Relative: 12 % (ref 3–12)
Neutro Abs: 2.3 10*3/uL (ref 1.7–7.7)
Neutrophils Relative %: 46 % (ref 43–77)
WBC: 5 10*3/uL (ref 4.0–10.5)

## 2011-11-14 NOTE — Assessment & Plan Note (Signed)
Roger [name] is a pleasant 71 y.o. male with transfusion dependent anemia & chronic kidney disease on coumadin & starting effient today.  No gross bleeding.  Hx anemia chronic disease & obscure GI bleed.  EGD/colonoscopies done elsewhere.  Hx non-bleeding small bowel AVMs/erosions & PUD 2 years ago.  Suspect intermittent occult bleeding, but heme negative today.  Hemoccult stools x 3 Check CBC next week at dialysis to be sure hgb stable Will discuss further work-up with Dr Jena Gauss.  Suspect he will need EGD at minimum. To ER if chest pain, SOB, dizziness or bleeding. No NSAIDs.

## 2011-11-16 NOTE — Progress Notes (Signed)
Quick Note:  Hgb much improved. Await hemoccults. FA:[id number],HQION G, MD CC: [facility]  ______

## 2011-11-16 NOTE — Progress Notes (Signed)
Faxed to PCP

## 2011-11-23 NOTE — Progress Notes (Signed)
Please call pt to remind him to return hemoccults Thanks

## 2011-11-23 NOTE — Progress Notes (Signed)
Discussed w/ Dr Jena Gauss.  Would await hemoccults.

## 2011-11-24 NOTE — Progress Notes (Signed)
Tried to call pt- NA. Mailed reminder letter. 

## 2011-12-11 NOTE — Progress Notes (Signed)
Quick Note:  Pt has not returned hemoccults despite our prompting.  ______

## 2011-12-24 NOTE — Progress Notes (Signed)
Quick Note:  Results reviewed at OV ______

## 2012-01-14 NOTE — Progress Notes (Signed)
REVIEWED.  

## 2012-03-12 ENCOUNTER — Inpatient Hospital Stay (HOSPITAL_COMMUNITY)
Admission: EM | Admit: 2012-03-12 | Discharge: 2012-03-17 | DRG: 377 | Disposition: A | Discharge status: Home / Self Care | Payer: Medicare Other | Attending: Internal Medicine | Admitting: Internal Medicine

## 2012-03-12 ENCOUNTER — Encounter (HOSPITAL_COMMUNITY): Payer: Self-pay | Admitting: Emergency Medicine

## 2012-03-12 DIAGNOSIS — R1319 Other dysphagia: Secondary | ICD-10-CM

## 2012-03-12 DIAGNOSIS — D62 Acute posthemorrhagic anemia: Secondary | ICD-10-CM | POA: Diagnosis present

## 2012-03-12 DIAGNOSIS — Z87891 Personal history of nicotine dependence: Secondary | ICD-10-CM

## 2012-03-12 DIAGNOSIS — Z992 Dependence on renal dialysis: Secondary | ICD-10-CM

## 2012-03-12 DIAGNOSIS — I252 Old myocardial infarction: Secondary | ICD-10-CM

## 2012-03-12 DIAGNOSIS — I480 Paroxysmal atrial fibrillation: Secondary | ICD-10-CM | POA: Diagnosis present

## 2012-03-12 DIAGNOSIS — K222 Esophageal obstruction: Secondary | ICD-10-CM

## 2012-03-12 DIAGNOSIS — I10 Essential (primary) hypertension: Secondary | ICD-10-CM | POA: Diagnosis present

## 2012-03-12 DIAGNOSIS — I120 Hypertensive chronic kidney disease with stage 5 chronic kidney disease or end stage renal disease: Secondary | ICD-10-CM | POA: Diagnosis present

## 2012-03-12 DIAGNOSIS — I959 Hypotension, unspecified: Secondary | ICD-10-CM

## 2012-03-12 DIAGNOSIS — Q391 Atresia of esophagus with tracheo-esophageal fistula: Secondary | ICD-10-CM

## 2012-03-12 DIAGNOSIS — I739 Peripheral vascular disease, unspecified: Secondary | ICD-10-CM | POA: Diagnosis present

## 2012-03-12 DIAGNOSIS — I4891 Unspecified atrial fibrillation: Secondary | ICD-10-CM | POA: Diagnosis present

## 2012-03-12 DIAGNOSIS — K449 Diaphragmatic hernia without obstruction or gangrene: Secondary | ICD-10-CM | POA: Diagnosis present

## 2012-03-12 DIAGNOSIS — N186 End stage renal disease: Secondary | ICD-10-CM | POA: Diagnosis present

## 2012-03-12 DIAGNOSIS — I214 Non-ST elevation (NSTEMI) myocardial infarction: Secondary | ICD-10-CM | POA: Diagnosis present

## 2012-03-12 DIAGNOSIS — T45515A Adverse effect of anticoagulants, initial encounter: Secondary | ICD-10-CM | POA: Diagnosis present

## 2012-03-12 DIAGNOSIS — I255 Ischemic cardiomyopathy: Secondary | ICD-10-CM | POA: Diagnosis present

## 2012-03-12 DIAGNOSIS — R195 Other fecal abnormalities: Secondary | ICD-10-CM

## 2012-03-12 DIAGNOSIS — K648 Other hemorrhoids: Secondary | ICD-10-CM | POA: Diagnosis present

## 2012-03-12 DIAGNOSIS — Z22322 Carrier or suspected carrier of Methicillin resistant Staphylococcus aureus: Secondary | ICD-10-CM

## 2012-03-12 DIAGNOSIS — I5022 Chronic systolic (congestive) heart failure: Secondary | ICD-10-CM | POA: Diagnosis present

## 2012-03-12 DIAGNOSIS — K922 Gastrointestinal hemorrhage, unspecified: Secondary | ICD-10-CM

## 2012-03-12 DIAGNOSIS — Z9861 Coronary angioplasty status: Secondary | ICD-10-CM

## 2012-03-12 DIAGNOSIS — I350 Nonrheumatic aortic (valve) stenosis: Secondary | ICD-10-CM | POA: Diagnosis present

## 2012-03-12 DIAGNOSIS — I209 Angina pectoris, unspecified: Secondary | ICD-10-CM

## 2012-03-12 DIAGNOSIS — Z79899 Other long term (current) drug therapy: Secondary | ICD-10-CM

## 2012-03-12 DIAGNOSIS — K5521 Angiodysplasia of colon with hemorrhage: Principal | ICD-10-CM | POA: Diagnosis present

## 2012-03-12 DIAGNOSIS — N189 Chronic kidney disease, unspecified: Secondary | ICD-10-CM | POA: Diagnosis present

## 2012-03-12 DIAGNOSIS — I708 Atherosclerosis of other arteries: Secondary | ICD-10-CM | POA: Diagnosis present

## 2012-03-12 DIAGNOSIS — I359 Nonrheumatic aortic valve disorder, unspecified: Secondary | ICD-10-CM | POA: Diagnosis present

## 2012-03-12 DIAGNOSIS — M109 Gout, unspecified: Secondary | ICD-10-CM | POA: Diagnosis present

## 2012-03-12 DIAGNOSIS — E785 Hyperlipidemia, unspecified: Secondary | ICD-10-CM | POA: Diagnosis present

## 2012-03-12 DIAGNOSIS — K219 Gastro-esophageal reflux disease without esophagitis: Secondary | ICD-10-CM | POA: Diagnosis present

## 2012-03-12 DIAGNOSIS — I2510 Atherosclerotic heart disease of native coronary artery without angina pectoris: Secondary | ICD-10-CM | POA: Diagnosis present

## 2012-03-12 DIAGNOSIS — D649 Anemia, unspecified: Secondary | ICD-10-CM

## 2012-03-12 DIAGNOSIS — M129 Arthropathy, unspecified: Secondary | ICD-10-CM | POA: Diagnosis present

## 2012-03-12 DIAGNOSIS — Z7901 Long term (current) use of anticoagulants: Secondary | ICD-10-CM

## 2012-03-12 HISTORY — DX: End stage renal disease: Z99.2

## 2012-03-12 HISTORY — DX: End stage renal disease: N18.6

## 2012-03-12 HISTORY — DX: Esophageal obstruction: K22.2

## 2012-03-12 LAB — CBC
HCT: 21.8 % — ABNORMAL LOW (ref 39.0–52.0)
HCT: 20 % — ABNORMAL LOW (ref 39.0–52.0)
HCT: 19.5 % — ABNORMAL LOW (ref 39.0–52.0)
Hemoglobin: 7.3 g/dL — ABNORMAL LOW (ref 13.0–17.0)
Hemoglobin: 6.4 g/dL — CL (ref 13.0–17.0)
Hemoglobin: 6.2 g/dL — CL (ref 13.0–17.0)
MCH: 40.6 pg — ABNORMAL HIGH (ref 26.0–34.0)
MCH: 39.3 pg — ABNORMAL HIGH (ref 26.0–34.0)
MCH: 38.8 pg — ABNORMAL HIGH (ref 26.0–34.0)
MCHC: 33.3 g/dL (ref 30.0–36.0)
MCHC: 33.5 g/dL (ref 30.0–36.0)
MCHC: 32 g/dL (ref 30.0–36.0)
MCHC: 31.8 g/dL (ref 30.0–36.0)
MCV: 121.3 fL — ABNORMAL HIGH (ref 78.0–100.0)
MCV: 121.1 fL — ABNORMAL HIGH (ref 78.0–100.0)
MCV: 121.9 fL — ABNORMAL HIGH (ref 78.0–100.0)
Platelets: 211 10*3/uL (ref 150–400)
Platelets: 208 K/uL (ref 150–400)
Platelets: 192 K/uL (ref 150–400)
RBC: 1.8 MIL/uL — ABNORMAL LOW (ref 4.22–5.81)
RBC: 1.6 MIL/uL — ABNORMAL LOW (ref 4.22–5.81)
RDW: 16.4 % — ABNORMAL HIGH (ref 11.5–15.5)
RDW: 16.6 % — ABNORMAL HIGH (ref 11.5–15.5)
RDW: 16.5 % — ABNORMAL HIGH (ref 11.5–15.5)
WBC: 5.8 10*3/uL (ref 4.0–10.5)
WBC: 5.1 K/uL (ref 4.0–10.5)
WBC: 5.3 K/uL (ref 4.0–10.5)

## 2012-03-12 LAB — CBC WITH DIFFERENTIAL/PLATELET
Eosinophils Relative: 8 % — ABNORMAL HIGH (ref 0–5)
HCT: 23.4 % — ABNORMAL LOW (ref 39.0–52.0)
Lymphocytes Relative: 26 % (ref 12–46)
Lymphs Abs: 1.5 10*3/uL (ref 0.7–4.0)
MCV: 120.6 fL — ABNORMAL HIGH (ref 78.0–100.0)
Monocytes Absolute: 0.7 10*3/uL (ref 0.1–1.0)
Platelets: 223 10*3/uL (ref 150–400)
RBC: 1.94 MIL/uL — ABNORMAL LOW (ref 4.22–5.81)
WBC: 5.8 10*3/uL (ref 4.0–10.5)

## 2012-03-12 LAB — TROPONIN I
Troponin I: <0.3 ng/mL
Troponin I: <0.3 ng/mL

## 2012-03-12 LAB — BASIC METABOLIC PANEL
CO2: 38 mEq/L — ABNORMAL HIGH (ref 19–32)
Calcium: 9.5 mg/dL (ref 8.4–10.5)
Chloride: 92 mEq/L — ABNORMAL LOW (ref 96–112)
Glucose, Bld: 134 mg/dL — ABNORMAL HIGH (ref 70–99)
Sodium: 142 mEq/L (ref 135–145)

## 2012-03-12 LAB — OCCULT BLOOD, POC DEVICE: Fecal Occult Bld: POSITIVE

## 2012-03-12 LAB — MRSA PCR SCREENING: MRSA by PCR: POSITIVE — AB

## 2012-03-12 LAB — PREPARE RBC (CROSSMATCH)

## 2012-03-12 LAB — PROTIME-INR
INR: 2.46 — ABNORMAL HIGH (ref 0.00–1.49)
Prothrombin Time: 25.5 seconds — ABNORMAL HIGH (ref 11.6–15.2)

## 2012-03-12 MED ORDER — CINACALCET HCL 30 MG PO TABS
30.0000 mg | ORAL_TABLET | Freq: Every day | ORAL | Status: DC
  Administered 2012-03-16 – 2012-03-17 (×2): 30 mg via ORAL
  Filled 2012-03-12 (×6): qty 1

## 2012-03-12 MED ORDER — ATORVASTATIN CALCIUM 20 MG PO TABS
20.0000 mg | ORAL_TABLET | Freq: Every day | ORAL | Status: DC
  Administered 2012-03-14 – 2012-03-17 (×4): 20 mg via ORAL
  Filled 2012-03-12 (×6): qty 1

## 2012-03-12 MED ORDER — DARBEPOETIN ALFA-POLYSORBATE 150 MCG/0.3ML IJ SOLN
150.0000 ug | INTRAMUSCULAR | Status: DC
  Administered 2012-03-15: 150 ug via INTRAVENOUS
  Filled 2012-03-12: qty 0.3

## 2012-03-12 MED ORDER — PARICALCITOL 5 MCG/ML IV SOLN
3.0000 ug | INTRAVENOUS | Status: DC
  Administered 2012-03-15 – 2012-03-17 (×2): 3 ug via INTRAVENOUS
  Filled 2012-03-12 (×2): qty 0.6

## 2012-03-12 MED ORDER — MUPIROCIN 2 % EX OINT
1.0000 "application " | TOPICAL_OINTMENT | Freq: Two times a day (BID) | CUTANEOUS | Status: DC
  Administered 2012-03-12 – 2012-03-17 (×9): 1 via NASAL
  Filled 2012-03-12 (×2): qty 22

## 2012-03-12 MED ORDER — ACETAMINOPHEN 325 MG PO TABS
650.0000 mg | ORAL_TABLET | ORAL | Status: DC | PRN
  Administered 2012-03-12 – 2012-03-15 (×2): 650 mg via ORAL
  Filled 2012-03-12 (×2): qty 2

## 2012-03-12 MED ORDER — AMIODARONE HCL 200 MG PO TABS
200.0000 mg | ORAL_TABLET | Freq: Every day | ORAL | Status: DC
  Filled 2012-03-12: qty 1

## 2012-03-12 MED ORDER — SODIUM CHLORIDE 0.9 % IV SOLN
8.0000 mg/h | INTRAVENOUS | Status: DC
  Administered 2012-03-13 – 2012-03-15 (×5): 8 mg/h via INTRAVENOUS
  Filled 2012-03-12 (×11): qty 80

## 2012-03-12 MED ORDER — PAROXETINE HCL 20 MG PO TABS
20.0000 mg | ORAL_TABLET | Freq: Every day | ORAL | Status: DC
  Administered 2012-03-14 – 2012-03-17 (×4): 20 mg via ORAL
  Filled 2012-03-12 (×6): qty 1

## 2012-03-12 MED ORDER — SODIUM CHLORIDE 0.9 % IJ SOLN
3.0000 mL | Freq: Two times a day (BID) | INTRAMUSCULAR | Status: DC
  Administered 2012-03-12 – 2012-03-17 (×8): 3 mL via INTRAVENOUS

## 2012-03-12 MED ORDER — SODIUM CHLORIDE 0.9 % IV BOLUS (SEPSIS)
250.0000 mL | Freq: Once | INTRAVENOUS | Status: AC
  Administered 2012-03-12: 250 mL via INTRAVENOUS

## 2012-03-12 MED ORDER — ALLOPURINOL 100 MG PO TABS
50.0000 mg | ORAL_TABLET | Freq: Two times a day (BID) | ORAL | Status: DC
  Administered 2012-03-13 – 2012-03-17 (×8): 50 mg via ORAL
  Filled 2012-03-12 (×12): qty 0.5

## 2012-03-12 MED ORDER — CHLORHEXIDINE GLUCONATE CLOTH 2 % EX PADS
6.0000 | MEDICATED_PAD | Freq: Every day | CUTANEOUS | Status: AC
  Administered 2012-03-13 – 2012-03-17 (×5): 6 via TOPICAL

## 2012-03-12 MED ORDER — SODIUM CHLORIDE 0.9 % IJ SOLN: 3.0000 mL | INTRAMUSCULAR | Status: DC | PRN

## 2012-03-12 MED ORDER — PANTOPRAZOLE SODIUM 40 MG IV SOLR: 40.0000 mg | Freq: Two times a day (BID) | INTRAVENOUS | Status: DC

## 2012-03-12 MED ORDER — LORAZEPAM 0.5 MG PO TABS
0.5000 mg | ORAL_TABLET | ORAL | Status: DC | PRN
  Administered 2012-03-12 – 2012-03-16 (×4): 0.5 mg via ORAL
  Filled 2012-03-12 (×4): qty 1

## 2012-03-12 MED ORDER — RENA-VITE PO TABS
1.0000 | ORAL_TABLET | Freq: Every day | ORAL | Status: DC
  Administered 2012-03-12 – 2012-03-17 (×5): 1 via ORAL
  Filled 2012-03-12 (×6): qty 1

## 2012-03-12 MED ORDER — SEVELAMER CARBONATE 800 MG PO TABS
2400.0000 mg | ORAL_TABLET | Freq: Three times a day (TID) | ORAL | Status: DC
  Administered 2012-03-16 (×2): 2400 mg via ORAL
  Filled 2012-03-12 (×17): qty 3

## 2012-03-12 NOTE — ED Notes (Signed)
Pt reports he was at HD this am and was told to come to ER for a hgb of 7.7 and positive heme stool test. Pt reports dark stools for about a week to week in a half. NAD

## 2012-03-12 NOTE — ED Notes (Signed)
IV attempted, IV team called.  

## 2012-03-12 NOTE — Progress Notes (Signed)
[facility] Consultation  Referring Provider:  Dr. Lonzo Cloud (Teaching service) Primary Care Physician:  [name], MD Primary Gastroenterologist:  Dr. Jena Gauss in [place] most recently  Reason for Consultation:  Melena, worsened anemia  HPI: Roger [name] is a 71 y.o. male with past medical history of end-stage renal disease on hemodialysis, CAD status post PCI last June 2013, hypertension, hypercholesterolemia, atrial fibrillation and gout who presents to the ER with complaints of weakness, dizziness, and intermittent chest discomfort after dialysis today. He was evaluated in the ER and noted to have dark stools heme positive. His hemoglobin was found to be 7.7 down from a baseline of around 10. He is maintained on chronic anticoagulation with warfarin and prasurgel.    He reports his stools a bit darker than normal over the last several weeks to one month. He reports no issues with abdominal pain, nausea or vomiting. No diarrhea or constipation. He does occasionally have bilateral lower back pain worse with walking. He's noted increasing dyspnea on exertion and fatigue. He reports intermittent chest pain but not to the level where he would use nitroglycerin.  He also reports intermittent solid food esophageal dysphagia, no odynophagia. No major issues with heartburn.  Past Medical History  Diagnosis Date  . HTN (hypertension)   . Arthritis   . Hypercholesteremia   . Kidney failure     secondary to HTN  . MI (myocardial infarction)   . GERD (gastroesophageal reflux disease)   . Atrial fibrillation   . Gout   . S/P coronary artery stent placement, 10/01/11 with DES to mid LAD and  PTCA of Diag 2 10/02/2011  . Anticoagulated on warfarin 10/06/2011  . Dialysis patient   . Heart murmur   . Atrial fibrillation   . Anemia     Past Surgical History  Procedure Date  . Ureteral stent placement   . Colonoscopy 04/2009    with EGD by Dr.Buccini moderately large hiatal hernia could  contribute to his anemia,Schatzki ring which might accout for his intermittent dysphagia/+internal hemorrhoids ;family hx of colon ca  . Esophagogastroduodenoscopy 03/2010    by Dr.Magod/small hiatal hernia with widley patent fibrous ring,minimal bulbitis otherwise nl  . Dialysis fistula creation   . Coronary angioplasty with stent placement 08/2011  . Coronary angioplasty with stent placement 2011    Roger [name]  . Givens capsule study 04/16/10    SB erosions vs. AVMs, non-specific distal SB inflammation in setting ASA, plavix, NSAIDs    Prior to Admission medications   Medication Sig Start Date End Date Taking? Authorizing Provider  allopurinol (ZYLOPRIM) 100 MG tablet Take 50 mg by mouth 2 (two) times daily.    Yes Historical Provider, MD  amiodarone (PACERONE) 200 MG tablet Take 200 mg by mouth daily. 10/15/11 10/14/12 Yes Nada Boozer, NP  Cimetidine (TAGAMET PO) Take 1 tablet by mouth daily as needed. For acid reflux.   Yes Historical Provider, MD  EFFIENT 10 MG TABS Take 10 mg by mouth Daily. 60mg  today, then 10mg  daily 11/12/11  Yes Runell Gess, MD  enalapril (VASOTEC) 10 MG tablet Take 10 mg by mouth daily.   Yes Historical Provider, MD  isosorbide mononitrate (IMDUR) 30 MG 24 hr tablet Take 30 mg by mouth every morning.    Yes Historical Provider, MD  metoprolol tartrate (LOPRESSOR) 25 MG tablet Take 25 mg by mouth 2 (two) times daily.   Yes Historical Provider, MD  multivitamin (RENA-VIT) TABS tablet Take 1 tablet by mouth every morning.  Yes Historical Provider, MD  pantoprazole (PROTONIX) 40 MG tablet Take 40 mg by mouth 2 (two) times daily.    Yes Historical Provider, MD  PARoxetine (PAXIL) 20 MG tablet Take 1 tablet (20 mg total) by mouth daily. 08/26/11 08/25/12 Yes Wilburt Finlay, PA  rosuvastatin (CRESTOR) 10 MG tablet Take 10 mg by mouth daily.   Yes Historical Provider, MD  sevelamer (RENVELA) 800 MG tablet Take 2,400 mg by mouth 3 (three) times daily with meals.   Yes  Historical Provider, MD  warfarin (COUMADIN) 5 MG tablet Take 0.5 tablets (2.5 mg total) by mouth daily. 10/15/11 10/14/12 Yes Christiane Ha, MD  nitroGLYCERIN (NITROSTAT) 0.4 MG SL tablet Place 1 tablet (0.4 mg total) under the tongue every 5 (five) minutes as needed. 08/26/11   Wilburt Finlay, PA    Current Facility-Administered Medications  Medication Dose Route Frequency Provider Last Rate Last Dose  . allopurinol (ZYLOPRIM) tablet 50 mg  50 mg Oral BID Neema Davina Poke, MD      . amiodarone (PACERONE) tablet 200 mg  200 mg Oral Daily Neema Davina Poke, MD      . atorvastatin (LIPITOR) tablet 20 mg  20 mg Oral q1800 Neema Davina Poke, MD      . pantoprazole (PROTONIX) 80 mg in sodium chloride 0.9 % 250 mL infusion  8 mg/hr Intravenous Continuous Neema Davina Poke, MD      . PARoxetine (PAXIL) tablet 20 mg  20 mg Oral Daily Neema Davina Poke, MD      . sevelamer (RENVELA) tablet 2,400 mg  2,400 mg Oral TID WC Neema K Sharda, MD      . sodium chloride 0.9 % injection 3 mL  3 mL Intravenous Q12H Neema K Sharda, MD      . sodium chloride 0.9 % injection 3 mL  3 mL Intravenous PRN Belia Heman, MD      . [DISCONTINUED] pantoprazole (PROTONIX) injection 40 mg  40 mg Intravenous Q12H Belia Heman, MD        Allergies as of 03/12/2012  . (No Known Allergies)    Family History  Problem Relation Age of Onset  . Coronary artery disease Father   . Hypertension Mother   . Coronary artery disease Mother   . Colon cancer Brother 11    History   Social History  . Marital Status: Married   Occupational History  . retired    Social History Main Topics  . Smoking status: Former Smoker -- 0.5 packs/day for 45 years    Types: Cigarettes    Quit date: 04/13/1992  . Smokeless tobacco: Not on file  . Alcohol Use: No  . Drug Use: No  . Sexually Active: No   Other Topics Concern  . 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     Review of  Systems: As per HPI, otherwise negative  Physical Exam: Vital signs in last 24 hours: Temp:  [98.5 F (36.9 C)-99 F (37.2 C)] 99 F (37.2 C) (11/30 1522) Pulse Rate:  [62-75] 68  (11/30 1507) Resp:  [12-22] 17  (11/30 1507) BP: (70-96)/(29-53) 87/39 mmHg (11/30 1415) SpO2:  [93 %-100 %] 99 % (11/30 1507)   Gen: awake, alert, NAD HEENT: anicteric, op clear CV: irreg, irreg, 3/6 SEM Pulm: CTA b/l Abd: soft, NT/ND, +BS throughout Ext: no c/c/e Neuro: nonfocal  Lab Results:  Basename 03/12/12 1436 03/12/12 1231  WBC  5.8 5.8  HGB 7.6* 7.6*  HCT 22.8* 23.4*  PLT 211 223   BMET  Basename 03/12/12 1231  NA 142  K 3.1*  CL 92*  CO2 38*  GLUCOSE 134*  BUN 15  CREATININE 4.01*  CALCIUM 9.5   PT/INR  Basename 03/12/12 1214  LABPROT 25.5*  INR 2.46*   Previous Endoscopies: EGD and small bowel enteroscopy-December 2011 -- small hiatus hernia with nonobstructing Schatzki's ring, a few prepyloric ulcers there were nonbleeding, normal examined small bowel and proximal jejunum  VCE - Dec 2011 -- small bowel erosion vs AVM and distal small bowel inflamamtion (non-specific)  Impression / Plan: 71 y.o. male with past medical history of end-stage renal disease on hemodialysis, CAD status post PCI last June 2013, hypertension, hypercholesterolemia, atrial fibrillation and gout who presents to the ER with complaints of weakness, dizziness, and intermittent chest discomfort after dialysis today found to have heme + stool and worsened anemia  1.  Heme + stool/worsened anemia -- likely a slow GI blood loss in the setting of warfarin and prasugrel.  I do not think he is actively bleeding currently.  Prior evaluations notable for pre-pyloric ulcers and ? Of small bowel AVM.   --For now, he is scheduled to receive 2u of pRBC (awaiting crossmatch) --Hold warfarin and prasugrel --Continue BID IV PPI (as ordered), already on PPI at home --Will need EGD timing to be determined by INR.  Prasugrel effect will be around 7 days, but likely EGD prior to this with understanding therapeutic interventions are limited (injection and hemoclip only) --Closely monitor H/H --2 PIVs   2.  Esophageal dysphagia -- hx of Schatzki's ring. Dilation likely would benefit him from a symptom standpoint, but dilation could only be performed off prasugrel 7 days.    LOS: 0 days   PYRTLE, JAY M  03/12/2012, 4:01 PM

## 2012-03-12 NOTE — Progress Notes (Signed)
Notified Md about critical HGB 6.4 and pt wanting something for anxiety.  Md aware of pt's VS 78.42 temp 102.2.  Received new orders.  Will continue to monitor. Roger [name]

## 2012-03-12 NOTE — ED Provider Notes (Signed)
History     CSN: [id number]  Arrival date & time 03/12/12  1107   First MD Initiated Contact with Patient 03/12/12 1111      Chief Complaint  Patient presents with  . GI Bleeding    (Consider location/radiation/quality/duration/timing/severity/associated sxs/prior treatment) The history is provided by the patient.   71 year old male comes in because of exertional chest pain and low blood pressure. He is a dialysis patient and was reported to have blood pressures low as 70 systolic during dialysis today. He has been having chest tightness over the last 2 days. Tightness comes on with exertion and is associated with dyspnea but not nausea or diaphoresis. Tightness will subside once he sits or rests. Tightness is moderate in intensity rates it at 6/10. He has noted that his stools have been darker than normal. He is on warfarin because of atrial fibrillation. He is also on Effient because of coronary artery stents. Hemoglobin was reported to be 7.7.  Past Medical History  Diagnosis Date  . HTN (hypertension)   . Arthritis   . Hypercholesteremia   . Kidney failure     secondary to HTN  . MI (myocardial infarction)   . GERD (gastroesophageal reflux disease)   . Atrial fibrillation   . Gout   . S/P coronary artery stent placement, 10/01/11 with DES to mid LAD and  PTCA of Diag 2 10/02/2011  . Anticoagulated on warfarin 10/06/2011  . Dialysis patient   . Heart murmur   . Atrial fibrillation   . Anemia     Past Surgical History  Procedure Date  . Ureteral stent placement   . Colonoscopy 04/2009    with EGD by Dr.Buccini moderately large hiatal hernia could contribute to his anemia,Schatzki ring which might accout for his intermittent dysphagia/+internal hemorrhoids ;family hx of colon ca  . Esophagogastroduodenoscopy 03/2010    by Dr.Magod/small hiatal hernia with widley patent fibrous ring,minimal bulbitis otherwise nl  . Dialysis fistula creation   . Coronary angioplasty with  stent placement 08/2011  . Coronary angioplasty with stent placement 2011    [name]  . Givens capsule study 04/16/10    SB erosions vs. AVMs, non-specific distal SB inflammation in setting ASA, plavix, NSAIDs    Family History  Problem Relation Age of Onset  . Coronary artery disease Father   . Hypertension Mother   . Coronary artery disease Mother   . Colon cancer Brother 34    History  Substance Use Topics  . Smoking status: Former Smoker -- 0.5 packs/day for 45 years    Types: Cigarettes    Quit date: 04/13/1992  . Smokeless tobacco: Not on file  . Alcohol Use: No      Review of Systems  All other systems reviewed and are negative.    Allergies  Review of patient's allergies indicates no known allergies.  Home Medications   Current Outpatient Rx  Name  Route  Sig  Dispense  Refill  . ALLOPURINOL 100 MG PO TABS   Oral   Take 50 mg by mouth 2 (two) times daily.          . AMIODARONE HCL 200 MG PO TABS   Oral   Take 200 mg by mouth daily.         [name] TAGAMET PO   Oral   Take 1 tablet by mouth daily as needed. For acid reflux.         [name] EFFIENT 10 MG PO TABS   Oral  Take 10 mg by mouth Daily. 60mg  today, then 10mg  daily         . ENALAPRIL MALEATE 10 MG PO TABS   Oral   Take 10 mg by mouth daily.         . ISOSORBIDE MONONITRATE ER 30 MG PO TB24   Oral   Take 30 mg by mouth every morning.          [name] METOPROLOL TARTRATE 25 MG PO TABS   Oral   Take 25 mg by mouth 2 (two) times daily.         [name] RENA-VITE PO TABS   Oral   Take 1 tablet by mouth every morning.          [name] PANTOPRAZOLE SODIUM 40 MG PO TBEC   Oral   Take 40 mg by mouth 2 (two) times daily.          [name] ROSUVASTATIN CALCIUM 10 MG PO TABS   Oral   Take 10 mg by mouth daily.         . WARFARIN SODIUM 5 MG PO TABS   Oral   Take 0.5 tablets (2.5 mg total) by mouth daily.         . EPOETIN ALFA 40981 UNIT/ML IJ SOLN   Subcutaneous   Inject 19,800 Units into  the skin 3 (three) times a week.         . IRON SUCROSE 20 MG/ML IV SOLN   Intravenous   Inject 50 mg into the vein once a week.         [name] NITROGLYCERIN 0.4 MG SL SUBL   Sublingual   Place 1 tablet (0.4 mg total) under the tongue every 5 (five) minutes as needed.   25 tablet   5   . PARICALCITOL 5 MCG/ML IV SOLN   Intravenous   Inject 5 mcg into the vein 3 (three) times a week.         [name] PAROXETINE HCL 20 MG PO TABS   Oral   Take 1 tablet (20 mg total) by mouth daily.         [name] SEVELAMER CARBONATE 800 MG PO TABS   Oral   Take 2,400 mg by mouth 3 (three) times daily with meals.           There were no vitals taken for this visit.  Physical Exam  Nursing note and vitals reviewed. 71 year old male, resting comfortably and in no acute distress. Vital signs are significant for hypotension with blood pressure 83/34. Oxygen saturation is 95%, which is normal. Head is normocephalic and atraumatic. PERRLA, EOMI. Oropharynx is clear. Neck is nontender and supple without adenopathy or JVD. Back is nontender and there is no CVA tenderness. Lungs are clear without rales, wheezes, or rhonchi. Chest is nontender. Heart has regular rate and rhythm without murmur. Abdomen is soft, flat, nontender without masses or hepatosplenomegaly and peristalsis is normoactive. Rectal exam shows small external skin tag, normal sphincter tone. Stool is greenish black and is sent for Hemoccult testing. Extremities have no cyanosis or edema, full range of motion is present. AV shunt is present in the left arm with prominent bruit. Skin is warm and dry without rash. Neurologic: Mental status is normal, cranial nerves are intact, there are no motor or sensory deficits.   ED Course  Procedures (including critical care time)  Results for orders placed during the hospital encounter of 03/12/12  CBC WITH DIFFERENTIAL  Component Value Range   WBC 5.8  4.0 - 10.5 K/uL   RBC 1.94 (*) 4.22 -  5.81 MIL/uL   Hemoglobin 7.6 (*) 13.0 - 17.0 g/dL   HCT 87.5 (*) 64.3 - 32.9 %   MCV 120.6 (*) 78.0 - 100.0 fL   MCH 39.2 (*) 26.0 - 34.0 pg   MCHC 32.5  30.0 - 36.0 g/dL   RDW 51.8 (*) 84.1 - 66.0 %   Platelets 223  150 - 400 K/uL   Neutrophils Relative 54  43 - 77 %   Neutro Abs 3.1  1.7 - 7.7 K/uL   Lymphocytes Relative 26  12 - 46 %   Lymphs Abs 1.5  0.7 - 4.0 K/uL   Monocytes Relative 12  3 - 12 %   Monocytes Absolute 0.7  0.1 - 1.0 K/uL   Eosinophils Relative 8 (*) 0 - 5 %   Eosinophils Absolute 0.5  0.0 - 0.7 K/uL   Basophils Relative 1  0 - 1 %   Basophils Absolute 0.0  0.0 - 0.1 K/uL  BASIC METABOLIC PANEL      Component Value Range   Sodium 142  135 - 145 mEq/L   Potassium 3.1 (*) 3.5 - 5.1 mEq/L   Chloride 92 (*) 96 - 112 mEq/L   CO2 38 (*) 19 - 32 mEq/L   Glucose, Bld 134 (*) 70 - 99 mg/dL   BUN 15  6 - 23 mg/dL   Creatinine, Ser 6.30 (*) 0.50 - 1.35 mg/dL   Calcium 9.5  8.4 - 16.0 mg/dL   GFR calc non Af Amer 14 (*) >90 mL/min   GFR calc Af Amer 16 (*) >90 mL/min  PROTIME-INR      Component Value Range   Prothrombin Time 25.5 (*) 11.6 - 15.2 seconds   INR 2.46 (*) 0.00 - 1.49  OCCULT BLOOD, POC DEVICE      Component Value Range   Fecal Occult Bld POSITIVE        1. Angina pectoris   2. GI bleeding   3. Anemia   4. Hypotension   5. End stage renal disease on dialysis    CRITICAL CARE Performed by: FUXNA,TFTDD   Total critical care time: 45 minutes  Critical care time was exclusive of separately billable procedures and treating other patients.  Critical care was necessary to treat or prevent imminent or life-threatening deterioration.  Critical care was time spent personally by me on the following activities: development of treatment plan with patient and/or surrogate as well as nursing, discussions with consultants, evaluation of patient's response to treatment, examination of patient, obtaining history from patient or surrogate, ordering and  performing treatments and interventions, ordering and review of laboratory studies, ordering and review of radiographic studies, pulse oximetry and re-evaluation of patient's condition.    MDM  Angina which is presumably due to anemia from blood loss. Cardiac markers will be obtained and blood was obtained for type and cross and INR level will be checked. He will need transfusion since he has symptomatic anemia.  Hemoglobin has come back 7.6. He is noted to be hypokalemic, however this is immediately following dialysis and will likely to equilobrate on its own without need for potassium supplementation. INR is therapeutic at 2.46. Case is discussed with Dr. Everardo Beals , medicine teaching service who agrees to admit the patient. Transfusion has been ordered. He is tolerating low blood pressure reasonably well at this point, so IV fluids will not be given.  He will get a significant fluid load with his transfusion and it is 3 days before his next dialysis so I do not wish to get him fluid overloaded.      Dione Booze, MD 03/12/12 1341

## 2012-03-12 NOTE — Consult Note (Signed)
[facility] Renal Consultation Note  Requesting MD: Internal Medicine Teaching service- Dr. Lonzo Cloud Indication for Consultation: ESRD and ESRD related needs  HPI:  Roger [name] is a 71 y.o. male with PMhx significant for CAD, gout, Atrial Fib on coumadin, chronic hypotension and also ESRD - HD TTS at [facility].  Patient was noted at the kidney center to have a hgb of 7.7 where it had been 8.9 on Nov 21 and 10.o on Nov 14 with positive stool cards.  He was symptomatic with CP and generalized weakness.  He has been admitted and to get 2 units PRBC and protonix drip, his coumadin and effient have been held and GI plans for EGD.  We are asked to assist with dialysis and dialysis related needs.  He did get his full run today, got 2000 of heparin.  Patient is currently eating dinner, pleasant and in NAD.  He says his BP is normally low.  He has noted dark stools but no abdominal pain and no obvious bleeding.       HD orders - [place] [facility]TS- AVF- 3 hours and 15 min, 2 K, 2.25 calc bath, EDW 76 kg, zemplar 3 mcg per HD and epo 19,800 units per HD, heparin 2000 units per treatment.  Of note, BP ran low today but is on enalapril as well as lopressor    PMHx:   Past Medical History  Diagnosis Date  . HTN (hypertension)   . Arthritis   . Hypercholesteremia   . Kidney failure     secondary to HTN  . MI (myocardial infarction)   . GERD (gastroesophageal reflux disease)   . Atrial fibrillation   . Gout   . S/P coronary artery stent placement, 10/01/11 with DES to mid LAD and  PTCA of Diag 2 10/02/2011  . Anticoagulated on warfarin 10/06/2011  . Dialysis patient   . Heart murmur   . Atrial fibrillation   . Anemia     Past Surgical History  Procedure Date  . Ureteral stent placement   . Colonoscopy 04/2009    with EGD by Dr.Buccini moderately large hiatal hernia could contribute to his anemia,Schatzki ring which might accout for his intermittent dysphagia/+internal  hemorrhoids ;family hx of colon ca  . Esophagogastroduodenoscopy 03/2010    by Dr.Magod/small hiatal hernia with widley patent fibrous ring,minimal bulbitis otherwise nl  . Dialysis fistula creation   . Coronary angioplasty with stent placement 08/2011  . Coronary angioplasty with stent placement 2011    [name]  . Givens capsule study 04/16/10    SB erosions vs. AVMs, non-specific distal SB inflammation in setting ASA, plavix, NSAIDs    Family Hx:  Family History  Problem Relation Age of Onset  . Coronary artery disease Father   . Hypertension Mother   . Coronary artery disease Mother   . Colon cancer Brother 5    Social History:  reports that he quit smoking about 19 years ago. His smoking use included Cigarettes. He has a 22.5 pack-year smoking history. He does not have any smokeless tobacco history on file. He reports that he does not drink alcohol or use illicit drugs.  Allergies: No Known Allergies  Medications: Prior to Admission medications   Medication Sig Start Date End Date Taking? Authorizing Provider  allopurinol (ZYLOPRIM) 100 MG tablet Take 50 mg by mouth 2 (two) times daily.    Yes Historical Provider, MD  amiodarone (PACERONE) 200 MG tablet Take 200 mg by mouth daily. 10/15/11  10/14/12 Yes Nada Boozer, NP  Cimetidine (TAGAMET PO) Take 1 tablet by mouth daily as needed. For acid reflux.   Yes Historical Provider, MD  EFFIENT 10 MG TABS Take 10 mg by mouth Daily. 60mg  today, then 10mg  daily 11/12/11  Yes Runell Gess, MD  enalapril (VASOTEC) 10 MG tablet Take 10 mg by mouth daily.   Yes Historical Provider, MD  isosorbide mononitrate (IMDUR) 30 MG 24 hr tablet Take 30 mg by mouth every morning.    Yes Historical Provider, MD  metoprolol tartrate (LOPRESSOR) 25 MG tablet Take 25 mg by mouth 2 (two) times daily.   Yes Historical Provider, MD  multivitamin (RENA-VIT) TABS tablet Take 1 tablet by mouth every morning.    Yes Historical Provider, MD  pantoprazole  (PROTONIX) 40 MG tablet Take 40 mg by mouth 2 (two) times daily.    Yes Historical Provider, MD  PARoxetine (PAXIL) 20 MG tablet Take 1 tablet (20 mg total) by mouth daily. 08/26/11 08/25/12 Yes Wilburt Finlay, PA  rosuvastatin (CRESTOR) 10 MG tablet Take 10 mg by mouth daily.   Yes Historical Provider, MD  sevelamer (RENVELA) 800 MG tablet Take 2,400 mg by mouth 3 (three) times daily with meals.   Yes Historical Provider, MD  warfarin (COUMADIN) 5 MG tablet Take 0.5 tablets (2.5 mg total) by mouth daily. 10/15/11 10/14/12 Yes Christiane Ha, MD  nitroGLYCERIN (NITROSTAT) 0.4 MG SL tablet Place 1 tablet (0.4 mg total) under the tongue every 5 (five) minutes as needed. 08/26/11   Wilburt Finlay, PA    I have reviewed the patient's current medications.  Labs:  Results for orders placed during the hospital encounter of 03/12/12 (from the past 48 hour(s))  OCCULT BLOOD, POC DEVICE     Status: Normal   Collection Time   03/12/12 12:02 PM      Component Value Range Comment   Fecal Occult Bld POSITIVE     PROTIME-INR     Status: Abnormal   Collection Time   03/12/12 12:14 PM      Component Value Range Comment   Prothrombin Time 25.5 (*) 11.6 - 15.2 seconds    INR 2.46 (*) 0.00 - 1.49   TYPE AND SCREEN     Status: Normal (Preliminary result)   Collection Time   03/12/12 12:25 PM      Component Value Range Comment   ABO/RH(D) PENDING      Antibody Screen POS      Sample Expiration 03/15/2012     CBC WITH DIFFERENTIAL     Status: Abnormal   Collection Time   03/12/12 12:31 PM      Component Value Range Comment   WBC 5.8  4.0 - 10.5 K/uL    RBC 1.94 (*) 4.22 - 5.81 MIL/uL    Hemoglobin 7.6 (*) 13.0 - 17.0 g/dL    HCT 40.9 (*) 81.1 - 52.0 %    MCV 120.6 (*) 78.0 - 100.0 fL    MCH 39.2 (*) 26.0 - 34.0 pg    MCHC 32.5  30.0 - 36.0 g/dL    RDW 91.4 (*) 78.2 - 15.5 %    Platelets 223  150 - 400 K/uL    Neutrophils Relative 54  43 - 77 %    Neutro Abs 3.1  1.7 - 7.7 K/uL    Lymphocytes Relative 26   12 - 46 %    Lymphs Abs 1.5  0.7 - 4.0 K/uL    Monocytes Relative 12  3 - 12 %    Monocytes Absolute 0.7  0.1 - 1.0 K/uL    Eosinophils Relative 8 (*) 0 - 5 %    Eosinophils Absolute 0.5  0.0 - 0.7 K/uL    Basophils Relative 1  0 - 1 %    Basophils Absolute 0.0  0.0 - 0.1 K/uL   BASIC METABOLIC PANEL     Status: Abnormal   Collection Time   03/12/12 12:31 PM      Component Value Range Comment   Sodium 142  135 - 145 mEq/L    Potassium 3.1 (*) 3.5 - 5.1 mEq/L    Chloride 92 (*) 96 - 112 mEq/L    CO2 38 (*) 19 - 32 mEq/L    Glucose, Bld 134 (*) 70 - 99 mg/dL    BUN 15  6 - 23 mg/dL    Creatinine, Ser 9.56 (*) 0.50 - 1.35 mg/dL    Calcium 9.5  8.4 - 21.3 mg/dL    GFR calc non Af Amer 14 (*) >90 mL/min    GFR calc Af Amer 16 (*) >90 mL/min   CBC     Status: Abnormal   Collection Time   03/12/12  2:36 PM      Component Value Range Comment   WBC 5.8  4.0 - 10.5 K/uL    RBC 1.88 (*) 4.22 - 5.81 MIL/uL    Hemoglobin 7.6 (*) 13.0 - 17.0 g/dL    HCT 08.6 (*) 57.8 - 52.0 %    MCV 121.3 (*) 78.0 - 100.0 fL    MCH 40.4 (*) 26.0 - 34.0 pg    MCHC 33.3  30.0 - 36.0 g/dL    RDW 46.9 (*) 62.9 - 15.5 %    Platelets 211  150 - 400 K/uL   CBC     Status: Abnormal   Collection Time   03/12/12  3:58 PM      Component Value Range Comment   WBC 5.1  4.0 - 10.5 K/uL    RBC 1.80 (*) 4.22 - 5.81 MIL/uL    Hemoglobin 7.3 (*) 13.0 - 17.0 g/dL    HCT 52.8 (*) 41.3 - 52.0 %    MCV 121.1 (*) 78.0 - 100.0 fL    MCH 40.6 (*) 26.0 - 34.0 pg    MCHC 33.5  30.0 - 36.0 g/dL    RDW 24.4 (*) 01.0 - 15.5 %    Platelets 208  150 - 400 K/uL   TROPONIN I     Status: Normal   Collection Time   03/12/12  3:58 PM      Component Value Range Comment   Troponin I <0.30  <0.30 ng/mL      ROS:  Constitutional: positive for fatigue Respiratory: positive for dyspnea on exertion Cardiovascular: negative Gastrointestinal: positive for dark stools Musculoskeletal:negative Neurological: negative  The  remainder of the ROS is negative  Physical Exam: Filed Vitals:   03/12/12 1611  BP:   Pulse:   Temp: 98.5 F (36.9 C)  Resp:      General: pleasant, eating dinner , NAD, family at bedside HEENT: PEERLA, EOMI, mucous membranes moist Neck: no JVD, bruits or adenopathy Heart:  irreg, irreg, no murmer.  Lungs: mostly clear Abdomen: soft, nontender , nondistended Extremities: no edema  Patient has a left upper arm AVF with a good thrill and bruit.  Skin: warm and dry Neuro: nonfocal   Assessment/Plan: 71 year old WM with multiple medical issues as well  as ESRD admitted with a presumed GIB and hypotension  1.GIB- presumed based on heme positive stools and falling hgb.  He is also on multiple anticoagulants. As of now, coumadin and effient are held.  He is to receive 2 units PRBC, he will be placed on a protonix drip and will be monitored closely with eventual plans for EGD.  Plans per primary team and GI 2.ESRD - He received his full HD treatment today.  Normally TTS via AVF at the [place] [facility].  Plan will be to continue regular schedule unless events require treatment sooner due to hyperkalemia or fluid overload.  No heparin with HD 3. 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  For transfusion today, continue ESA 4. Bones- As OP is on sensipar, renvela 3 with meals as well as Zemplar.  All of these meds will be continued inhouse.  5. Hypertension/volume- low BP at present, maybe due to blood loss.  Not able to remove fluid today at HD.  Holding BP meds, hopiong that transfusion will help BP.  Keep in closely monitored setting.  May need to reconsider the need for ACE and Beta blocker ?   Marcelene Weidemann A 03/12/2012, 5:20 PM

## 2012-03-12 NOTE — H&P (Signed)
Hospital Admission Note Date: 03/12/2012  Patient name: Roger [name] Medical record number: [id number] Date of birth: 08/23/1940 Age: 71 y.o. Gender: male PCP: [name], MD  Medical Service: Josefine Class  Attending physician: Dr. Lonzo Cloud   1st Contact: Dr. Shirlee Latch   Pager: [phone] 2nd Contact: Dr. Everardo Beals   Pager: [phone] After 5 pm or weekends: 1st Contact:  Intern on call   Pager: [phone] 2nd Contact:  Resident on call  Pager: [phone]  Chief Complaint: Chest pain, dark stools   History of Present Illness: Patient is a 71 year old male with past medical history of coronary artery disease status post stenting, atrial fibrillation anticoagulated on warfarin, gout, end-stage renal disease from hypertensive nephropathy on dialysis, and GERD presenting with a couple weeks complaint of chest discomfort as well as one week of dizziness and generalized weakness. Patient presented to the ED from dialysis due to reports of chest pain and measured systolic pressure of 70. Patient reported to the ED provided he was having exertional chest pain, but upon our questioning he denied any exertional component. He reports a couple weeks of mild chest discomfort that he felt to not warrant nitroglycerin therapy at home. It does not feel like his prior MIs. He says it begins over his left-sided chest and radiates across the right and is occasionally sharp. He does not noticed a pattern of onset, and this it occurs at rest. He denies any palpitations, radiation to the neck, radiation to the left arm. He most recently had heart catheterization with stenting to the LAD and balloon angioplasty of a diagonal branch in June of 2013. Prior to that, he is bare-metal stent placed in the circumflex in 2011 after STEMI, with repeat drug-eluting stent to the circumflex as well as OM branch in May 2013 after NSTEMI. Also concerning at dialysis was a hemoglobin of 7.7, down from his baseline of around 10. He's  anticoagulated on warfarin for atrial fibrillation and effient in setting of coronary artery stenting. Upon further questioning, patient endorses dark, tarry stools for the past week. He says he has never had dark stools in the past although he has had positive fecal occult blood testing. He does report worsening dizziness and weakness over the past week at home, but no shortness of breath. He denies hematemesis, abdominal pain, BRBPR. Has had some nausea.  Most recently, he had EGD  in December 2012 which demonstrated small hiatal hernia and nonbleeding peripyloric ulcers. He was also noted to have a Schatzki ring that was patent at the time. 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 Nuclear tagged red blood cell scan was negative. Limited colonoscopy in January 2011 showed only internal hemorrhoids. Patient has had bleeding complications from supratherapeutic anticoagulation the past, but never from the GI tract. In January 2013, he required blood transfusion for profuse bleeding from AV fistula site with a noted INR of 7 at the time. He reports that he is taking 2.5 mg of warfarin currently. Of note, patient reports worsening solid food dysphagia over the past 3 months. Was told by prior provider at that Schatzki ring was responsible for symptoms of dysphagia, but was widely patent at the time of endoscopy.    Meds: Current Outpatient Rx  Name  Route  Sig  Dispense  Refill  . ALLOPURINOL 100 MG PO TABS   Oral   Take 50 mg by mouth 2 (two) times daily.          . AMIODARONE HCL 200 MG PO  TABS   Oral   Take 200 mg by mouth daily.         [name] TAGAMET PO   Oral   Take 1 tablet by mouth daily as needed. For acid reflux.         [name] EFFIENT 10 MG PO TABS   Oral   Take 10 mg by mouth Daily. 60mg  today, then 10mg  daily         . ENALAPRIL MALEATE 10 MG PO TABS   Oral   Take 10 mg by mouth daily.         . ISOSORBIDE MONONITRATE ER 30 MG PO TB24   Oral   Take 30 mg by  mouth every morning.          [name] METOPROLOL TARTRATE 25 MG PO TABS   Oral   Take 25 mg by mouth 2 (two) times daily.         [name] RENA-VITE PO TABS   Oral   Take 1 tablet by mouth every morning.          [name] PANTOPRAZOLE SODIUM 40 MG PO TBEC   Oral   Take 40 mg by mouth 2 (two) times daily.          [name] PAROXETINE HCL 20 MG PO TABS   Oral   Take 1 tablet (20 mg total) by mouth daily.         [name] ROSUVASTATIN CALCIUM 10 MG PO TABS   Oral   Take 10 mg by mouth daily.         [name] SEVELAMER CARBONATE 800 MG PO TABS   Oral   Take 2,400 mg by mouth 3 (three) times daily with meals.         . WARFARIN SODIUM 5 MG PO TABS   Oral   Take 0.5 tablets (2.5 mg total) by mouth daily.         [name] NITROGLYCERIN 0.4 MG SL SUBL   Sublingual   Place 1 tablet (0.4 mg total) under the tongue every 5 (five) minutes as needed.   25 tablet   5     Allergies: Allergies as of 03/12/2012  . (No Known Allergies)   Past Medical History  Diagnosis Date  . HTN (hypertension)   . Arthritis   . Hypercholesteremia   . Kidney failure     secondary to HTN  . MI (myocardial infarction)   . GERD (gastroesophageal reflux disease)   . Atrial fibrillation   . Gout   . S/P coronary artery stent placement, 10/01/11 with DES to mid LAD and  PTCA of Diag 2 10/02/2011  . Anticoagulated on warfarin 10/06/2011  . Dialysis patient   . Heart murmur   . Atrial fibrillation   . Anemia    Past Surgical History  Procedure Date  . Ureteral stent placement   . Colonoscopy 04/2009    with EGD by Dr.Buccini moderately large hiatal hernia could contribute to his anemia,Schatzki ring which might accout for his intermittent dysphagia/+internal hemorrhoids ;family hx of colon ca  . Esophagogastroduodenoscopy 03/2010    by Dr.Magod/small hiatal hernia with widley patent fibrous ring,minimal bulbitis otherwise nl  . Dialysis fistula creation   . Coronary angioplasty with stent placement 08/2011  . Coronary  angioplasty with stent placement 2011    [name]  . Givens capsule study 04/16/10    SB erosions vs. AVMs, non-specific distal SB inflammation in setting ASA, plavix, NSAIDs   Family History  Problem Relation Age of Onset  . Coronary artery disease Father   . Hypertension Mother   . Coronary artery disease Mother   . Colon cancer Brother 61   History   Social History  . Marital Status: Married    Spouse Name: N/A    Number of Children: N/A  . Years of Education: N/A   Occupational History  . retired    Social History Main Topics  . Smoking status: Former Smoker -- 0.5 packs/day for 45 years    Types: Cigarettes    Quit date: 04/13/1992  . Smokeless tobacco: Not on file  . Alcohol Use: No  . Drug Use: No  . Sexually Active: No   Other Topics Concern  . 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     Review of Systems: 10 pt ROS performed, pertinent positives and negatives noted in HPI  Physical Exam: Blood pressure 87/39, pulse 66, temperature 98.9 F (37.2 C), temperature source Oral, resp. rate 22, SpO2 100.00%. Vitals reviewed. General: resting in bed, NAD HEENT: pale conjunctivae, PERRL, EOMI, no scleral icterus Cardiac: regular rhythm with HR in 50s, harsh 2/6 systolic murmur at RUSB and 3/6 systolic murmur at apex and radiating to L axilla Pulm: clear to auscultation bilaterally, no wheezes, rales, or rhonchi Abd: soft, nontender, nondistended, BS present Rectum: External hemorrhoid, nonthrombosed and non bleeding. No blood in anal vault.  Ext: L UE AVF access w bruit and thrill, without evidence of infection or bleeding. Legs warm and well perfused, no pedal edema.  Neuro: alert and oriented X3, cranial nerves II-XII grossly intact, strength and sensation to light touch equal in bilateral upper and lower extremities  Lab results: Basic Metabolic Panel:  Basename 03/12/12 1231  NA 142  K 3.1*    CL 92*  CO2 38*  GLUCOSE 134*  BUN 15  CREATININE 4.01*  CALCIUM 9.5  MG --  PHOS --   CBC:  Basename 03/12/12 1231  WBC 5.8  NEUTROABS 3.1  HGB 7.6*  HCT 23.4*  MCV 120.6*  PLT 223   Coagulation:  Basename 03/12/12 1214  LABPROT 25.5*  INR 2.46*    Other results: EKG: Sinus rhythm with HR 67, normal axis, normal intervals, slight ST segment depression in anterolateral leads  Assessment & Plan by Problem: Patient is a 71 year old male with past medical history of coronary artery disease status post stenting, atrial fibrillation anticoagulated on warfarin, gout, 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   1) Acute blood loss anemia Pt w 1 week dark tarry stools and Hb 7.5. On multiple anticoagulant and antiplatelet agents at home (warfarin, effient). Dark tarry stools concerning for upper GI versus proximal colon blood loss. Has had extensive GI workup within the past 2 years for occult blood in stool and microcytic anemia, but has never had gross blood loss like this in the past. Etiologies include possible prepyloric ulceration seen on EGD, versus questionable AVM/erosions on capsule endoscopy, versus internal hemorrhoid bleed. He is hypertensive with systolic pressures down to the 70s and diastolics in the 30s. He has had symptomatology of anemia at home including dizziness and generalized weakness. - Admit to stepdown -2 units PRBCs ordered in the ED - GI consulted and will see the patient today, appreciate recommendations -Protonix drip -N.p.o. midnight for possible endoscopic evaluation and -CBCs every 4  hours -No indication for NG tube at this point. - Fluid bolus for symptomatic hypotension  2) Chest pain Patient presents with a couple weeks complaints of nonexertional chest pain. Symptoms do not sound typical for ACS, however patient has extensive coronary disease  and multiple stenting interventions as recent as May 2013. Last echo in 09/2011 showed EF 50-55% and moderate inferior Hypokinesis. EKG notable for mild depressions of ST segment in anterolateral leads. Chest discomfort most likely representes demand ischemia in setting of acute blood loss anemia. Could also be referred gastrointestinal pain as patient may have upper GI ulcerative bleed. -cycle cardiac enzymes x3, repeat EKG if worsening sx and in am -Admit to telemetry -IV protonix drip as in #1 - hold ASA in setting of GI bleed, hold BB in setting of bradycardia  3) Coronary artery disease Patient with extensive coronary artery disease with multiple stenting procedures including bare-metal and drug-eluting to the left circumflex, drug-eluting to the LAD and and diagonal, and balloon angioplasty of OM.  -Holding effient in setting of acute GI bleed -Holding beta blocker as patient with heart rate in 50s  -Continue statin   4) Paroxysmal atrial fibrillation on anticoagulation  Patient with atrial fibrillation on amiodarone, metoprolol, and warfarin at home. In sinus bradycardia here. Patient appears in sinus bradycardia on monitor, however we do not have an EKG yet. -Holding metoprolol in setting of bradycardia -Holding warfarin in setting of acute blood loss anemia -EKG pending  5) ESRD on dialysis Patient with end-stage renal disease secondary to hypertensive nephropathy. His not making urine. Dialyzes on TTS schedule on [facility] in [place] with nephrologist Dr. Lowell Guitar. Has left upper extremity aVF access. Access site does not appear infected and is not bleeding. Patient dialyzed this morning. -Have contacted renal notify of patient's admission -Patient last dialyzed completely today, will need dialysis again on Tuesday if still  hospital  Dispo: Disposition is deferred at this time, awaiting improvement of current medical problems. Anticipated discharge in approximately 2-3 day(s).    The patient does have a current PCP (MCINNIS,ANGUS G, MD), therefore will not be requiring OPC follow-up after discharge.   The patient does not have transportation limitations that hinder transportation to clinic appointments.  Signed: Bronson Curb 03/12/2012, 2:34 PM

## 2012-03-12 NOTE — Progress Notes (Signed)
Per blood bank units not available. Requesting attending to re-evaluate need. Attending paged. Awaiting response. Will continue to monitor and advise attending as needed.

## 2012-03-13 ENCOUNTER — Encounter (HOSPITAL_COMMUNITY): Payer: Self-pay | Admitting: Cardiology

## 2012-03-13 ENCOUNTER — Inpatient Hospital Stay (HOSPITAL_COMMUNITY): Payer: Medicare Other

## 2012-03-13 DIAGNOSIS — Z992 Dependence on renal dialysis: Secondary | ICD-10-CM | POA: Insufficient documentation

## 2012-03-13 DIAGNOSIS — I209 Angina pectoris, unspecified: Secondary | ICD-10-CM | POA: Insufficient documentation

## 2012-03-13 DIAGNOSIS — R079 Chest pain, unspecified: Secondary | ICD-10-CM

## 2012-03-13 DIAGNOSIS — I4891 Unspecified atrial fibrillation: Secondary | ICD-10-CM

## 2012-03-13 DIAGNOSIS — N186 End stage renal disease: Secondary | ICD-10-CM | POA: Insufficient documentation

## 2012-03-13 DIAGNOSIS — I2510 Atherosclerotic heart disease of native coronary artery without angina pectoris: Secondary | ICD-10-CM

## 2012-03-13 LAB — CBC
HCT: 23 % — ABNORMAL LOW (ref 39.0–52.0)
Hemoglobin: 7.7 g/dL — ABNORMAL LOW (ref 13.0–17.0)
MCHC: 32.4 g/dL (ref 30.0–36.0)
MCHC: 33.5 g/dL (ref 30.0–36.0)
MCV: 106.4 fL — ABNORMAL HIGH (ref 78.0–100.0)
Platelets: 185 10*3/uL (ref 150–400)
Platelets: 182 10*3/uL (ref 150–400)
RBC: 2.35 MIL/uL — ABNORMAL LOW (ref 4.22–5.81)
RBC: 2.16 MIL/uL — ABNORMAL LOW (ref 4.22–5.81)
RDW: 17.1 % — ABNORMAL HIGH (ref 11.5–15.5)
RDW: 27 % — ABNORMAL HIGH (ref 11.5–15.5)
WBC: 4.6 10*3/uL (ref 4.0–10.5)
WBC: 4.2 10*3/uL (ref 4.0–10.5)

## 2012-03-13 LAB — TROPONIN I
Troponin I: 2.19 ng/mL (ref <0.30)
Troponin I: 2.52 ng/mL (ref <0.30)
Troponin I: 2.14 ng/mL (ref <0.30)

## 2012-03-13 LAB — PROTIME-INR
INR: 1.64 — ABNORMAL HIGH (ref 0.00–1.49)
Prothrombin Time: 18.9 seconds — ABNORMAL HIGH (ref 11.6–15.2)

## 2012-03-13 LAB — RENAL FUNCTION PANEL
CO2: 34 mEq/L — ABNORMAL HIGH (ref 19–32)
Calcium: 8.8 mg/dL (ref 8.4–10.5)
GFR calc Af Amer: 11 mL/min — ABNORMAL LOW (ref >90)
Glucose, Bld: 94 mg/dL (ref 70–99)
Sodium: 140 mEq/L (ref 135–145)

## 2012-03-13 MED ORDER — PHYTONADIONE 5 MG PO TABS
10.0000 mg | ORAL_TABLET | Freq: Once | ORAL | Status: AC
  Administered 2012-03-13: 10 mg via ORAL
  Filled 2012-03-13: qty 2

## 2012-03-13 MED ORDER — DIPHENHYDRAMINE HCL 25 MG PO TABS
25.0000 mg | ORAL_TABLET | Freq: Once | ORAL | Status: AC
  Administered 2012-03-13: 25 mg via ORAL
  Filled 2012-03-13 (×2): qty 1

## 2012-03-13 MED ORDER — ACETAMINOPHEN 500 MG PO TABS
500.0000 mg | ORAL_TABLET | Freq: Once | ORAL | Status: AC
  Administered 2012-03-13: 500 mg via ORAL
  Filled 2012-03-13: qty 1

## 2012-03-13 MED ORDER — SODIUM CHLORIDE 0.9 % IV BOLUS (SEPSIS)
500.0000 mL | Freq: Once | INTRAVENOUS | Status: DC
  Administered 2012-03-13: 500 mL via INTRAVENOUS

## 2012-03-13 NOTE — Progress Notes (Signed)
INTERNAL MEDICINE TEACHING SERVICE Night Float Progress Note   Subjective:    We were called overnight by RN Darel Hong regarding multiple issues, including new onset fever (prior to any blood products), as well as persistently declining Hgb. The patient indicates that he has not had recurrence of chest pain since at least 20:00. He additionally denies shortness of breath, lightheadedness, dizziness. He confirms recent cough that is occasionally productive, chills. States NO recurrent black stools except those noted on the AM of admission.    Objective:    BP 79/42  Pulse 75  Temp 100.4 F (38 C) (Oral)  Resp 17  Ht 5\' 8"  (1.727 m)  Wt 167 lb 8.8 oz (76 kg)  BMI 25.48 kg/m2  SpO2 98%   Labs: Basic Metabolic Panel:  Lab 03/12/12 0960  NA 142  K 3.1*  CL 92*  CO2 38*  GLUCOSE 134*  BUN 15  CREATININE 4.01*  CALCIUM 9.5  MG --  PHOS --    CBC:  Lab 03/12/12 2250 03/12/12 2025 03/12/12 1558 03/12/12 1436 03/12/12 1231  WBC 5.3 5.3 5.1 -- --  NEUTROABS -- -- -- -- 3.1  HGB 6.2* 6.4* 7.3* -- --  HCT 19.5* 20.0* 21.8* -- --  MCV 121.9* 122.7* 121.1* 121.3* 120.6*  PLT 192 225 208 -- --    Cardiac Enzymes:  Lab 03/12/12 2031 03/12/12 1558  CKTOTAL -- --  CKMB -- --  CKMBINDEX -- --  TROPONINI <0.30 <0.30    Coagulation Studies:  Basename 03/12/12 1214  LABPROT 25.5*  INR 2.46*     Assessment/ Plan:    Assessment: 1. 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 Patient is noted to have worsening anemia with admission Hgb of 7.6, now 6.2. Admission specimen was likely hemo-concentrated with change due to volume resuscitation. He has NOT had recurrent episodes of bloody stools, and has NOT had recurrent chest pain. Troponins also remain negative.Of note, the pathologist paged [place] regarding the patient's type and cross. 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 The patient was informed of these results, and that still, given his clinical picture (hypotension, occasional CP, and worsening anemia), transfusion would still be recommended. He refuses transfusion at this time. He states that given risk of transfusion reaction, he does not want to proceed with transfusion without consulting with his daughters. He was discussed the risk of prolonging intervention, which he understands.  2. Hypotension - likely contributed by #1 in addition to ongoing amiodarone usage in setting of #1. He was kept even on dialysis (therefore, not a contributing cause). 3. Fevers - unclear etiology, patient does complain of cough (although seems moreso subacute). 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 Will need to further evaluate. Of note, this is prior to blood transfusion, therefore, NOT considered to be related to transfusion reaction.  Plan:  Defer transfusion for now given patient refusal to proceed with transfusion without talking to daughters.  Continue to closely monitor CBC, with rediscussion with patient if emergent need arises.  Will provide additional NS 500 cc over next 2 hours.  Will obtain blood cultures and CXR in evaluation  of fever.   UA deferred as pt does not make urine.  Hold amiodarone.  As RN to inform team if recurrent fevers.  Patient instructed to notify RN IMMEDIATELY if recurrent bloody stools/ black stools, chest pain, shorntess of breath, lightheadedness, dizziness.  Continue to cycle CE.  Add AM EKG.   Signed: Johnette Abraham, Roma Schanz, Internal Medicine Resident Pager: [phone] 03/13/2012, 1:28 AM  Priscella Mann, DO  03/13/2012, 12:45 AM

## 2012-03-13 NOTE — H&P (Signed)
Internal Medicine teaching Service Attending Dr.Willim Turnage. I have personally examined the patient and reviewed the h and P documented by the Resident. In brief  Chief complaint: chest pain dark stools HOPI: patient with complex cardiac issues recently stented with drug eluting stent comes in with decreasing HB and active chest pain. He has not got blood overnight due to crossmatching issues. He has had previous GI bleeds. 9 point review of system as documented in the Resident note. Social history admitting medication family history past surgical history allergies reviewed. Physical examination Notable for  Normal exam with vitals stable Labs are significant for :  Increasing troponin n decreasing hb elevated INR  Imaging is significant for: EKG: ST depression currently resolving A and P: Cardiology : Nstemi with increasing troponins. Appreciate cardiology consult. GI : appreciate input. Would reverse anticoagulation if patients hb does not respond to blood transfusion. Nephrology: appreciate input Patient continues to be with guarded prognosis. Rest per resident documentation.

## 2012-03-13 NOTE — Progress Notes (Signed)
CRITICAL VALUE ALERT  Critical value received: Trop  Date of notification:  03/13/12  Time of notification:  0350  Critical value read back:yes  Nurse who received alert:  Emilie Rutter  MD notified (1st page):  Collier Bullock  Time of first page:  [phone]  MD notified (2nd page):  Time of second page:  Responding MD:    Time MD responded:  [phone]

## 2012-03-13 NOTE — Progress Notes (Signed)
Subjective: Patient denies chest pain, dizziness, lightheadedness, shortness of breath.  He has not had a bowel movement.  He wants to know if a GI procedure will be done today.  Daughter and wife at bedside.   Objective: Vital signs in last 24 hours: Filed Vitals:   03/13/12 0600 03/13/12 0653 03/13/12 0700 03/13/12 0805  BP: 88/46 79/45 88/42    Pulse: 75 67 67   Temp: 100.2 F (37.9 C) 99.7 F (37.6 C)  99.1 F (37.3 C)  TempSrc: Oral Oral  Oral  Resp: 19 17 18    Height:      Weight:      SpO2: 96%  96%    Weight change:   Intake/Output Summary (Last 24 hours) at 03/13/12 1110 Last data filed at 03/13/12 0805  Gross per 24 hour  Intake 1675.83 ml  Output      0 ml  Net 1675.83 ml   General: lying in bed alert and oriented, no distress HEENT: [place]/at CV:  RRR, no murmers Lungs: ctab Abdomen: soft, ntnd, +hypoactive bs Extremities: warm, no cyanosis or edema  Neuro: CN 2-12 grossly intact, moving all 4 extremities.   Lab Results: Basic Metabolic Panel:  Lab 03/13/12 0981 03/12/12 1231  NA 140 142  K 3.5 3.1*  CL 94* 92*  CO2 34* 38*  GLUCOSE 94 134*  BUN 30* 15  CREATININE 5.57* 4.01*  CALCIUM 8.8 9.5  MG -- --  PHOS 3.6 --   Liver Function Tests:  Lab 03/13/12 0320  AST --  ALT --  ALKPHOS --  BILITOT --  PROT --  ALBUMIN 3.0*   CBC:  Lab 03/13/12 0240 03/12/12 2250 03/12/12 1231  WBC 4.5 5.3 --  NEUTROABS -- -- 3.1  HGB 5.9* 6.2* --  HCT 18.2* 19.5* --  MCV 123.8* 121.9* --  PLT 185 192 --   Cardiac Enzymes:  Lab 03/13/12 0913 03/13/12 0240 03/12/12 2031  CKTOTAL -- -- --  CKMB -- -- --  CKMBINDEX -- -- --  TROPONINI 2.19* 0.54* <0.30   Coagulation:  Lab 03/13/12 0240 03/12/12 1214  LABPROT 24.7* 25.5*  INR 2.35* 2.46*   Misc. Labs: oending INR, CBC  Micro Results: Recent Results (from the past 240 hour(s))  MRSA PCR SCREENING     Status: Abnormal   Collection Time   03/12/12  5:54 PM      Component Value Range Status  Comment   MRSA by PCR POSITIVE (*) NEGATIVE Final    Studies/Results: Dg Chest Port 1 View  03/13/2012  *RADIOLOGY REPORT*  Clinical Data: Cough and fever.  PORTABLE CHEST - 1 VIEW  Comparison: 10/14/2011  Findings: Stable heart size and aortic tortuosity.  No evidence of pulmonary infiltrate, edema or pleural fluid.  IMPRESSION: No active disease.   Original Report Authenticated By: Irish Lack, M.D.    Medications:  Scheduled Meds:    . [COMPLETED] acetaminophen  500 mg Oral Once  . allopurinol  50 mg Oral BID  . atorvastatin  20 mg Oral q1800  . Chlorhexidine Gluconate Cloth  6 each Topical Q0600  . cinacalcet  30 mg Oral Q breakfast  . darbepoetin (ARANESP) injection - DIALYSIS  150 mcg Intravenous Q Tue-HD  . [COMPLETED] diphenhydrAMINE  25 mg Oral Once  . multivitamin  1 tablet Oral Daily  . mupirocin ointment  1 application Nasal BID  . paricalcitol  3 mcg Intravenous Q T,Th,Sa-HD  . PARoxetine  20 mg Oral Daily  . [COMPLETED] phytonadione  10 mg Oral Once  . sevelamer  2,400 mg Oral TID WC  . [COMPLETED] sodium chloride  250 mL Intravenous Once  . sodium chloride  3 mL Intravenous Q12H  . [DISCONTINUED] amiodarone  200 mg Oral Daily  . [DISCONTINUED] pantoprazole (PROTONIX) IV  40 mg Intravenous Q12H  . [COMPLETED] sodium chloride  500 mL Intravenous Once   Continuous Infusions:    . 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    1.  Acute blood loss anemia -With unknown source.  History of GIB in the past 2011 with negative Nuclear scan and endoscopy with ulcerations noted in the pre-pylorus on endoscopy and small hiatal hernia, schatizki ring (patent). Previous  capsule study showed small bowel AVM vs erosions though nuclear tagged RBC scan negative.  Other dDx i.e AVM, hemorrhoids -Hemoglobin trending down 7.6 to 5.9 -GI consulted for endoscopy/colonoscopy and will do procedure pending INR trending downward -pending INR (currently 2.35 with vitamin K overnight), if less than 1.4 no FFP -holding Coumadin at least 1 month per Dr. Herbie Baltimore, holding Effient -Transfusing 2 units of blood (of note the patient has warm antibodies)  -posttransfusion will repeat CBC and if his hemoglobin has not corrected with transfuse 2 units FFP currently on hold.   -goal hbg 10, monitoring cbc q4 hours  -Continue Protonix gtt   2. Elevated troponin secondary to demand ischemia vs. NSTEMI -0.54 trending up to 2.19 -Etiology probably secondary to demand ischemia due to blood loss (most likely) vs. NSTEMI as there was ST depression in V2-V6. Patient is currently asymptomatic without chest pain  -12/1 CXR negative -Cardiology (Dr. Herbie Baltimore) consulted and aware -will cycle enzymes.   3. Hypotension with history of Hypertension -Etiology could be secondary to GIB loss.  No evidence of third spacing or sodium wasting as electrolytes are normal  -BP ranging systolic 70s-80s/40s -will continue to monitor. Holding enalapril 10 mg qd, Amiodarone 200 mg qd, Imdur EF 30 mg, Metoprolol 25 mg bid   4. Possibly GI blood loss -GI following with fobt + 11/30 with h/o +fobt in the past years  -Endoscopy or colonoscopy on hold until INR trends down -patient is NPO  5. Fever -He does not have tachypnea, tachycardia or leukocytosis -T max 102.5 overnight. Trended down now patient is afebrile -pending blood cultures. He does not make urine ESRD -No source as of yet. If he continues spiking fevers will start antibiotics empirically.   6. History of CAD and grade 2 diastolic dysfunction -previous stenting LAD and balloon angioplasty of diagonal branch 09/2011.  History of STEMI CFX BMS  12/11 with repeat DES to cicumflex (as well as OM) May 2013 (08/22/2011) after NSTEMI -08/2011 EF 50-55% with grade 2 diastolic dysfunction -discontinued Effient to to possible GIB. Will hold Coumadin x 1 month as well.   -Consider Plavix depending on P2Y12 levels in the past versus Brilinta.  7. MRSA + nares -protocol and contact precautions  8. 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 F/E/N -will monitor daily labs -NPO pending GI procedure   11. DVT Px -none due to  bleeding    Dispo: Disposition is deferred at this time, awaiting improvement of current medical problems.  Anticipated discharge in approximately 3-5 day(s).   The patient does have a current PCP (Butch Penny G, MD), therefore will be requiring OPC follow-up after discharge with him.   The patient does not have transportation limitations that hinder transportation to clinic appointments.  .Services Needed at time of discharge: Y = Yes, Blank = No PT:   OT:   RN:   Equipment:   Other:     LOS: 1 day   Annett Gula [phone] 03/13/2012, 11:10 AM

## 2012-03-13 NOTE — Progress Notes (Signed)
Notified Md about Hgb 6.2  BP 79/42, Temp 100.4 after Tylenol given.  Pt resting with eyes closed.  Will continue to monitor. Emilie Rutter [place]

## 2012-03-13 NOTE — Progress Notes (Signed)
Subjective: Cross cover-LHC-GI Patient denies having any further rectal bleeding. Has not had a BM today. Denies having any abdominal pain or nausea. He has had some solid food dysphagia and has a history of a hiatal hernia and a Schatzki's ring. Hemoglobin 5.9 gm/dl today.  Objective: Vital signs in last 24 hours: Temp:  [98.4 F (36.9 C)-102.5 F (39.2 C)] 98.4 F (36.9 C) (12/01 1212) Pulse Rate:  [62-83] 67  (12/01 0700) Resp:  [13-23] 18  (12/01 0700) BP: (70-106)/(29-53) 88/42 mmHg (12/01 0700) SpO2:  [93 %-100 %] 96 % (12/01 0700) Weight:  [76 kg (167 lb 8.8 oz)-78.4 kg (172 lb 13.5 oz)] 78.4 kg (172 lb 13.5 oz) (12/01 0500)    Intake/Output from previous day: 11/30 0701 - 12/01 0700 In: 1663.3 [P.O.:300; I.V.:720; Blood:197.5; IV Piggyback:445.8] Out: -  Intake/Output this shift: Total I/O In: 12.5 [Blood:12.5] Out: -   General appearance: alert, cooperative, appears stated age, no distress and pale Resp: clear to auscultation bilaterally Cardio: irregular rate and rhythm, S1, S2 irregular, no murmur, click, rub or gallop GI: soft, non-tender; bowel sounds normal; no masses,  no organomegaly Extremities: extremities normal, atraumatic, no cyanosis or edema  Lab Results:  Basename 03/13/12 0240 03/12/12 2250 03/12/12 2025  WBC 4.5 5.3 5.3  HGB 5.9* 6.2* 6.4*  HCT 18.2* 19.5* 20.0*  PLT 185 192 225   BMET  Basename 03/13/12 0320 03/12/12 1231  NA 140 142  K 3.5 3.1*  CL 94* 92*  CO2 34* 38*  GLUCOSE 94 134*  BUN 30* 15  CREATININE 5.57* 4.01*  CALCIUM 8.8 9.5   LFT  Basename 03/13/12 0320  PROT --  ALBUMIN 3.0*  AST --  ALT --  ALKPHOS --  BILITOT --  BILIDIR --  IBILI --   PT/INR  Basename 03/13/12 0240 03/12/12 1214  LABPROT 24.7* 25.5*  INR 2.35* 2.46*   Studies/Results: Dg Chest Port 1 View  03/13/2012  *RADIOLOGY REPORT*  Clinical Data: Cough and fever.  PORTABLE CHEST - 1 VIEW  Comparison: 10/14/2011  Findings: Stable heart size and  aortic tortuosity.  No evidence of pulmonary infiltrate, edema or pleural fluid.  IMPRESSION: No active disease.   Original Report Authenticated By: Irish Lack, M.D.     Medications: I have reviewed the patient's current medications.  Assessment/Plan: 1) Guaiac positive stools with anemia: will continue to monitor his counts closely till his INR improves-he has been on Effient.   2) Dysphagia/GERD/History of Schatzki's ring: he will benefit from an EGD with dilatation as well.   LOS: 1 day   Everlina Gotts 03/13/2012, 12:50 PM

## 2012-03-13 NOTE — Consult Note (Signed)
[facility]       CONSULTATION NOTE   Reason for Consult: Exertional Angina with mild Troponin elevation in setting of GI Bleed related anemia;  Advice on how to proceed with anticoagulation / antiplatelet Rx.  Requesting Physician: Dr. Lonzo Cloud  Cardiologist: Runell Gess., MD  HPI: [name] is well known to me from his 2013 summertime events -- NSTEMI in May with occluded LCx-OM treated with complex multi-stent PCI (DES stents used due to ISR/tjhrombosis of previous BMS as the culprit), Dx'd with PAF at that time.  Plavix changed to Effient due to P2Y12 Inhibition levels & d/c'd on Warfarin for Afib; NSTEMI in June in the setting of Afib RVR with existing mid LAD & Diag disease --> proceeded with planned staged PCI of LAD with PTCA of small caliber / yet major Diag.    He also has ESRD on HD as well as PAD.  He has been doing well with no further anginal symptoms since his last stent. He denied any further symptoms of atrial fibrillation either until the last few weeks he's noticed worsening dizziness and weakness as well as a couple episodes of exertional discomfort in his chest that is    somewhat like his prior cardiac symptoms but to a much lesser degree, and has not warranted the use of nitroglycerin.  He presented emergency room to [facility] from dialysis yesterday with a complaint of chest pain during dialysis was found to be hypotensive with blood pressures in the 70s systolic.  He was found in the emergency room to be profoundly anemic initial hemoglobin 7.7 but is now down to 5.8. He was given vitamin K to reverse his INR was therapeutic in the 2.3 range, however his transfusions have been delayed due to antibodies.   He is now currently getting blood at, and is feeling much better. He is no further episodes of chest discomfort since starting to get blood. He also denies any symptoms of heart failure.  He denies any hematochezia however does note  dark tarry/melanotic stool and a was found to be guaiac positive. He has had some nausea but denies any abdominal pain or vomiting. He does have a history of a hiatal hernia and nonbleeding pyloric ulcers as well as Schatzki's ring in the past. 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  GI medicines on board however they're waiting for his INR to drop prior to proceeding with any diagnostic procedures.   PMHx:  Past Medical History  Diagnosis Date  . HTN (hypertension)   . Arthritis   . Hypercholesteremia   . Kidney failure     secondary to HTN  . MI (myocardial infarction)   . GERD (gastroesophageal reflux disease)   . Atrial fibrillation     Paroxysmal - 2 episodes related to ischemic NSTEMI events  5 & 09/2011  . Gout   . S/P coronary artery stent placement, 10/01/11 with DES to mid LAD and  PTCA of Diag 2 10/01/2009    Inferior STEMI 2011 - BMS to LCx; NSTEMI 5/13 with ISR/thrombosis of Cx BMS -->multiple DES to  LCx ; staged PCI of LAD /Balloon PTCA of major Diag 09/2011 for Sx (NSTEMI)   . Anticoagulated on warfarin 10/06/2011  . Dialysis patient   . Heart murmur   . Anemia   . AVM (arteriovenous malformation) of duodenum, acquired   . Schatzki's ring    Pertinent findings from Echo 08/2011:  Left ventricle: The cavity size was normal. Wall thickness was increased in a pattern of moderate LVH. Systolic function was low normal. EF ~ 50% to 55%. Moderate inferior hypokinesis. Doppler parameters are consistent withpseudonormal left ventricular relaxation (grade2 diastolic dysfunction). The E/A ratio is 1.7 The E/e' ratio is >15 suggesting markedly elevated LV filling pressure. - Aortic valve: Moderately calcified with restricted leaflet motion. ~ Moderate AS; Left atrium: Severely dilated (41 ml/m2)   Past Surgical History  Procedure Date  . Ureteral stent placement   . Colonoscopy 04/2009    with EGD by Dr.Buccini moderately large hiatal hernia could  contribute to his anemia,Schatzki ring which might accout for his intermittent dysphagia/+internal hemorrhoids ;family hx of colon ca  . Esophagogastroduodenoscopy 03/2010    by Dr.Magod/small hiatal hernia with widley patent fibrous ring,minimal bulbitis otherwise nl  . Dialysis fistula creation   . Coronary angioplasty with stent placement 08/2011 & 09/2011    [employer] Resolute DES Stents (4 overlapping in LCx- OM1; 1 in mid LAD; PTCA only of Major Diag (small caliber)  . Coronary angioplasty with stent placement 2011    Roger [name]  . [place]iv[place] capsule study 04/16/10    SB erosions vs. AVMs, non-specific distal SB inflammation in setting ASA, plavix, NSAIDs    FAMHx: Family History  Problem Relation Age of Onset  . Coronary artery disease Father   . Hypertension Mother   . Coronary artery disease Mother   . Colon cancer Brother 29    SOCHx:  reports that he quit smoking about 19 years ago. His smoking use included Cigarettes. He has a 22.5 pack-year smoking history. He does not have any smokeless tobacco history on file. He reports that he does not drink alcohol or use illicit drugs.  ALLERGIES: No Known Allergies  ROS: Cardiovascular ROS: positive for - chest pain, dyspnea on exertion and shortness of breath negative for - edema, irregular heartbeat, loss of consciousness, orthopnea, palpitations, paroxysmal nocturnal dyspnea or rapid heart rate A comprehensive review of systems was negative except for: Gastrointestinal: positive for melena and nausea  HOME MEDICATIONS: Prescriptions prior to admission  Medication Sig Dispense Refill  . allopurinol (ZYLOPRIM) 100 MG tablet Take 50 mg by mouth 2 (two) times daily.       [name] amiodarone (PACERONE) 200 MG tablet Take 200 mg by mouth daily.      . Cimetidine (TAGAMET PO) Take 1 tablet by mouth daily as needed. For acid reflux.      [name] EFFIENT 10 MG TABS Take 10 mg by mouth Daily. 60mg  today, then 10mg  daily      . enalapril (VASOTEC)  10 MG tablet Take 10 mg by mouth daily.      . isosorbide mononitrate (IMDUR) 30 MG 24 hr tablet Take 30 mg by mouth every morning.       . metoprolol tartrate (LOPRESSOR) 25 MG tablet Take 25 mg by mouth 2 (two) times daily.      . multivitamin (RENA-VIT) TABS tablet Take 1 tablet by mouth every morning.       . pantoprazole (PROTONIX) 40 MG tablet Take 40 mg by mouth 2 (two) times daily.       [name] PARoxetine (PAXIL) 20 MG tablet Take 1 tablet (20 mg total) by mouth daily.      . rosuvastatin (CRESTOR) 10 MG tablet Take 10 mg by mouth daily.      . sevelamer (RENVELA) 800 MG tablet Take 2,400 mg by mouth 3 (three) times daily  with meals.      . warfarin (COUMADIN) 5 MG tablet Take 0.5 tablets (2.5 mg total) by mouth daily.      . nitroGLYCERIN (NITROSTAT) 0.4 MG SL tablet Place 1 tablet (0.4 mg total) under the tongue every 5 (five) minutes as needed.  25 tablet  5    HOSPITAL MEDICATIONS: I have reviewed the patient's current medications.    . [COMPLETED] acetaminophen  500 mg Oral Once  . allopurinol  50 mg Oral BID  . atorvastatin  20 mg Oral q1800  . Chlorhexidine Gluconate Cloth  6 each Topical Q0600  . cinacalcet  30 mg Oral Q breakfast  . darbepoetin (ARANESP) injection - DIALYSIS  150 mcg Intravenous Q Tue-HD  . [COMPLETED] diphenhydrAMINE  25 mg Oral Once  . multivitamin  1 tablet Oral Daily  . mupirocin ointment  1 application Nasal BID  . paricalcitol  3 mcg Intravenous Q T,Th,Sa-HD  . PARoxetine  20 mg Oral Daily  . [COMPLETED] phytonadione  10 mg Oral Once  . sevelamer  2,400 mg Oral TID WC  . [COMPLETED] sodium chloride  250 mL Intravenous Once  . sodium chloride  3 mL Intravenous Q12H  . [DISCONTINUED] amiodarone  200 mg Oral Daily  . [DISCONTINUED] pantoprazole (PROTONIX) IV  40 mg Intravenous Q12H  . [COMPLETED] sodium chloride  500 mL Intravenous Once   VITALS: Blood pressure 88/42, pulse 67, temperature 99.1 F (37.3 C), temperature source Oral, resp. rate 18,  height 5\' 8"  (1.727 m), weight 78.4 kg (172 lb 13.5 oz), SpO2 96.00%.  PHYSICAL EXAM: General appearance: alert, cooperative, appears stated age, no distress and feels much better; Pleasant mood & affect. Neck: no adenopathy, no carotid bruit, no JVD and supple, symmetrical, trachea midline Lungs: clear to auscultation bilaterally, normal percussion bilaterally and non-labored Heart: regular rate and rhythm, S1, S2 normal, systolic murmur: systolic ejection 2/6, crescendo, decrescendo and harsh at 2nd right intercostal space, throughout the precordium, radiates to carotids, no rub and no gallop Abdomen: soft, non-tender; bowel sounds normal; no masses,  no organomegaly Extremities: extremities normal, atraumatic, no cyanosis or edema Pulses: 2+ and symmetric Skin: Skin color, texture, turgor normal. No rashes or lesions; mild pallor with conjunctival pallor Neurologic: Mental status: Alert, oriented, thought content appropriate Cranial nerves: normal  LABS: Results for orders placed during the hospital encounter of 03/12/12 (from the past 48 hour(s))  OCCULT BLOOD, POC DEVICE     Status: Normal   Collection Time   03/12/12 12:02 PM      Component Value Range Comment   Fecal Occult Bld POSITIVE     PROTIME-INR     Status: Abnormal   Collection Time   03/12/12 12:14 PM      Component Value Range Comment   Prothrombin Time 25.5 (*) 11.6 - 15.2 seconds    INR 2.46 (*) 0.00 - 1.49   TYPE AND SCREEN     Status: Normal (Preliminary result)   Collection Time   03/12/12 12:25 PM      Component Value Range Comment   ABO/RH(D) O POS      Antibody Screen POS      Sample Expiration 03/15/2012      Antibody Identification ANTI-K WARM AUTO SUSPECTED      DAT, IgG POS      Antibody ID,T [place] WARM AUTOANTIBODY      PT AG Type        Value: POSITIVE FOR c ANTIGEN POSITIVE FOR C ANTIGEN POSITIVE  FOR S ANTIGEN POSITIVE FOR e ANTIGEN   Unit Number [id number]      Blood Component Type RED  CELLS,LR      Unit division 00      Status of Unit ALLOCATED      Donor AG Type        Value: NEGATIVE FOR KELL ANTIGEN NEGATIVE FOR KIDD A ANTIGEN   Transfusion Status OK TO TRANSFUSE      Crossmatch Result COMPATIBLE      Unit Number [id number]      Blood Component Type RED CELLS,LR      Unit division 00      Status of Unit ISSUED      Donor AG Type        Value: NEGATIVE FOR KELL ANTIGEN NEGATIVE FOR KIDD A ANTIGEN   Transfusion Status OK TO TRANSFUSE      Crossmatch Result COMPATIBLE      Unit Number [id number]      Blood Component Type RBC LR PHER1      Unit division 00      Status of Unit ISSUED      Donor AG Type        Value: NEGATIVE FOR KELL ANTIGEN NEGATIVE FOR KIDD A ANTIGEN   Transfusion Status OK TO TRANSFUSE      Crossmatch Result COMPATIBLE     PREPARE RBC (CROSSMATCH)     Status: Normal   Collection Time   03/12/12 12:25 PM      Component Value Range Comment   Order Confirmation ORDER PROCESSED BY BLOOD BANK     CBC WITH DIFFERENTIAL     Status: Abnormal   Collection Time   03/12/12 12:31 PM      Component Value Range Comment   WBC 5.8  4.0 - 10.5 K/uL    RBC 1.94 (*) 4.22 - 5.81 MIL/uL    Hemoglobin 7.6 (*) 13.0 - 17.0 g/dL    HCT 52.8 (*) 41.3 - 52.0 %    MCV 120.6 (*) 78.0 - 100.0 fL    MCH 39.2 (*) 26.0 - 34.0 pg    MCHC 32.5  30.0 - 36.0 g/dL    RDW 24.4 (*) 01.0 - 15.5 %    Platelets 223  150 - 400 K/uL    Neutrophils Relative 54  43 - 77 %    Neutro Abs 3.1  1.7 - 7.7 K/uL    Lymphocytes Relative 26  12 - 46 %    Lymphs Abs 1.5  0.7 - 4.0 K/uL    Monocytes Relative 12  3 - 12 %    Monocytes Absolute 0.7  0.1 - 1.0 K/uL    Eosinophils Relative 8 (*) 0 - 5 %    Eosinophils Absolute 0.5  0.0 - 0.7 K/uL    Basophils Relative 1  0 - 1 %    Basophils Absolute 0.0  0.0 - 0.1 K/uL   BASIC METABOLIC PANEL     Status: Abnormal   Collection Time   03/12/12 12:31 PM      Component Value Range Comment   Sodium 142  135 - 145 mEq/L    Potassium  3.1 (*) 3.5 - 5.1 mEq/L    Chloride 92 (*) 96 - 112 mEq/L    CO2 38 (*) 19 - 32 mEq/L    Glucose, Bld 134 (*) 70 - 99 mg/dL    BUN 15  6 - 23 mg/dL    Creatinine, Ser 2.72 (*) 0.50 -  1.35 mg/dL    Calcium 9.5  8.4 - 78.2 mg/dL    GFR calc non Af Amer 14 (*) >90 mL/min    GFR calc Af Amer 16 (*) >90 mL/min   CBC     Status: Abnormal   Collection Time   03/12/12  2:36 PM      Component Value Range Comment   WBC 5.8  4.0 - 10.5 K/uL    RBC 1.88 (*) 4.22 - 5.81 MIL/uL    Hemoglobin 7.6 (*) 13.0 - 17.0 g/dL    HCT 95.6 (*) 21.3 - 52.0 %    MCV 121.3 (*) 78.0 - 100.0 fL    MCH 40.4 (*) 26.0 - 34.0 pg    MCHC 33.3  30.0 - 36.0 g/dL    RDW 08.6 (*) 57.8 - 15.5 %    Platelets 211  150 - 400 K/uL   CBC     Status: Abnormal   Collection Time   03/12/12  3:58 PM      Component Value Range Comment   WBC 5.1  4.0 - 10.5 K/uL    RBC 1.80 (*) 4.22 - 5.81 MIL/uL    Hemoglobin 7.3 (*) 13.0 - 17.0 g/dL    HCT 46.9 (*) 62.9 - 52.0 %    MCV 121.1 (*) 78.0 - 100.0 fL    MCH 40.6 (*) 26.0 - 34.0 pg    MCHC 33.5  30.0 - 36.0 g/dL    RDW 52.8 (*) 41.3 - 15.5 %    Platelets 208  150 - 400 K/uL   TROPONIN I     Status: Normal   Collection Time   03/12/12  3:58 PM      Component Value Range Comment   Troponin I <0.30  <0.30 ng/mL   MRSA PCR SCREENING     Status: Abnormal   Collection Time   03/12/12  5:54 PM      Component Value Range Comment   MRSA by PCR POSITIVE (*) NEGATIVE   CBC     Status: Abnormal   Collection Time   03/12/12  8:25 PM      Component Value Range Comment   WBC 5.3  4.0 - 10.5 K/uL    RBC 1.63 (*) 4.22 - 5.81 MIL/uL    Hemoglobin 6.4 (*) 13.0 - 17.0 g/dL    HCT 24.4 (*) 01.0 - 52.0 %    MCV 122.7 (*) 78.0 - 100.0 fL    MCH 39.3 (*) 26.0 - 34.0 pg    MCHC 32.0  30.0 - 36.0 g/dL    RDW 27.2 (*) 53.6 - 15.5 %    Platelets 225  150 - 400 K/uL   TROPONIN I     Status: Normal   Collection Time   03/12/12  8:31 PM      Component Value Range Comment   Troponin I <0.30   <0.30 ng/mL   CBC     Status: Abnormal   Collection Time   03/12/12 10:50 PM      Component Value Range Comment   WBC 5.3  4.0 - 10.5 K/uL    RBC 1.60 (*) 4.22 - 5.81 MIL/uL    Hemoglobin 6.2 (*) 13.0 - 17.0 g/dL CRITICAL VALUE NOTED.  VALUE IS CONSISTENT WITH PREVIOUSLY REPORTED AND CALLED VALUE.   HCT 19.5 (*) 39.0 - 52.0 %    MCV 121.9 (*) 78.0 - 100.0 fL    MCH 38.8 (*) 26.0 - 34.0 pg  MCHC 31.8  30.0 - 36.0 g/dL    RDW 16.1 (*) 09.6 - 15.5 %    Platelets 192  150 - 400 K/uL   CBC     Status: Abnormal   Collection Time   03/13/12  2:40 AM      Component Value Range Comment   WBC 4.5  4.0 - 10.5 K/uL    RBC 1.47 (*) 4.22 - 5.81 MIL/uL    Hemoglobin 5.9 (*) 13.0 - 17.0 g/dL CRITICAL VALUE NOTED.  VALUE IS CONSISTENT WITH PREVIOUSLY REPORTED AND CALLED VALUE.   HCT 18.2 (*) 39.0 - 52.0 %    MCV 123.8 (*) 78.0 - 100.0 fL    MCH 40.1 (*) 26.0 - 34.0 pg    MCHC 32.4  30.0 - 36.0 g/dL    RDW 04.5 (*) 40.9 - 15.5 %    Platelets 185  150 - 400 K/uL   TROPONIN I     Status: Abnormal   Collection Time   03/13/12  2:40 AM      Component Value Range Comment   Troponin I 0.54 (*) <0.30 ng/mL   PROTIME-INR     Status: Abnormal   Collection Time   03/13/12  2:40 AM      Component Value Range Comment   Prothrombin Time 24.7 (*) 11.6 - 15.2 seconds    INR 2.35 (*) 0.00 - 1.49   RENAL FUNCTION PANEL     Status: Abnormal   Collection Time   03/13/12  3:20 AM      Component Value Range Comment   Sodium 140  135 - 145 mEq/L    Potassium 3.5  3.5 - 5.1 mEq/L    Chloride 94 (*) 96 - 112 mEq/L    CO2 34 (*) 19 - 32 mEq/L    Glucose, Bld 94  70 - 99 mg/dL    BUN 30 (*) 6 - 23 mg/dL DELTA CHECK NOTED   Creatinine, Ser 5.57 (*) 0.50 - 1.35 mg/dL    Calcium 8.8  8.4 - 81.1 mg/dL    Phosphorus 3.6  2.3 - 4.6 mg/dL    Albumin 3.0 (*) 3.5 - 5.2 g/dL    GFR calc non Af Amer 9 (*) >90 mL/min    GFR calc Af Amer 11 (*) >90 mL/min   PREPARE FRESH FROZEN PLASMA     Status: Normal (Preliminary  result)   Collection Time   03/13/12  7:32 AM      Component Value Range Comment   Unit Number [id number]      Blood Component Type THAWED PLASMA      Unit division 00      Status of Unit ALLOCATED      Transfusion Status OK TO TRANSFUSE      Unit Number [id number]      Blood Component Type THAWED PLASMA      Unit division 00      Status of Unit ALLOCATED      Transfusion Status OK TO TRANSFUSE     TROPONIN I     Status: Abnormal   Collection Time   03/13/12  9:13 AM      Component Value Range Comment   Troponin I 2.19 (*) <0.30 ng/mL    IMAGING: Dg Chest Port 1 View  03/13/2012  *RADIOLOGY REPORT*  Clinical Data: Cough and fever.  PORTABLE CHEST - 1 VIEW  Comparison: 10/14/2011  Findings: Stable heart size and aortic tortuosity.  No evidence of pulmonary infiltrate, edema  or pleural fluid.  IMPRESSION: No active disease.   Original Report Authenticated By: Irish Lack, M.D.     IMPRESSION: Principal Problem:  *Acute blood loss anemia Active Problems:  S/P coronary artery stent placement, 08/2011 -- 4 overlapping DES to LCx-OM for ~occluded (ISR/thrombosis) BMS to LCx-OM; 10/01/11 with DES to mid LAD and  PTCA of Diag 2  Hypotension  HTN (hypertension)  End stage renal disease on dialysis; HD T, Th Sat  CAD, MI CFX BMS 12/11 with ISR DES (as well as OM) May 2013  Anticoagulated on warfarin  GI bleeding  Angina pectoris  I am surprised at the level of Troponin elevation associated with this episode of Supply vs. Demand ischemia.  He certainly has small vessel disease (including the major Diag that had PTCA performed on in June -- too small in caliber for stent. He also has Anterolateral ECG ST depressions that would correlate with ischemia in this territory.  Unfortunately, at this point we are limited to supportive care as further invasive interventions are prohibited by his significant anemia and fear of exacerbating a bleed with more  anticoagulation.  RECOMMENDATIONS:  Agree with transfusion to get Hgb as close to 9-10 as possible, monitor for SSx of volume overload as he does have diastolic dysfunction with mild-moderate systolic dysfunction that could be exacerbated by what was appropriately diagnosed as Supply vs. Demand ischemia.  He certainly has small vessel disease (including the major Diag that had PTCA performed on in June -- too small in caliber for stent.  For now - I agree with holding both warfarin & Effient.  The risk of CVA with PAF is 3-5% per year, but not as high as risk from adverse outcome from continued bleeding.  Would not restart on d/c unless he has recurrent PAF while inpatient -- with the importance of the antiplatelet agent superceding the pAF anticoagulation, I would hesitate to restart warfarin for at least one month.  We are at ~5-6 months out from his DES PCI from this summer, unfortunately, he has multiple DES stents in his newly revascularized LCx (the site of 2 MIs) as well as his mid LAD.    Current data from [place] would support safely stopping DAPT at this time period when 3rd Generation DES stents are used.  At this point, I feel that we need to hold Effient until the H//H & cause of bleed is found & treated.  However, given the extent of stented segment & LAD involvement, would prefer that a thienopyridine is re-instituted prior to discharge.    If GI would agree, would at least start low dose ASA in the interim -- once safe from a GI Bleed standpoint.  With his BP still running low - we will need to hold ACE-I & BB, but would plan to restart BB preferentially dose once BP improves.  Imdur would be next with ACE-I being last.  Continue Amiodarone for pAF prevention (if NPO for a couple of days, he has been fully loaded & should be OK, but IV dosing may be necessary if pAF recurs.  Time Spent Directly with Patient: 30  minutes  Lyssa Hackley W, M.D., M.S. [facility] [address] [place], [place]  [address]  [phone] Pager # [phone] 03/13/2012 12:00 PM

## 2012-03-13 NOTE — Progress Notes (Signed)
Subjective:   Had temp to 102 overnight, troponin turned slightly positive.  They had trouble with matching blood so is only s/p first unit this AM.  Says he feels better Objective Vital signs in last 24 hours: Filed Vitals:   03/13/12 0600 03/13/12 0653 03/13/12 0700 03/13/12 0805  BP: 88/46 79/45 88/42    Pulse: 75 67 67   Temp: 100.2 F (37.9 C) 99.7 F (37.6 C)  99.1 F (37.3 C)  TempSrc: Oral Oral  Oral  Resp: 19 17 18    Height:      Weight:      SpO2: 96%  96%    Weight change:   Intake/Output Summary (Last 24 hours) at 03/13/12 0833 Last data filed at 03/13/12 0805  Gross per 24 hour  Intake 1675.83 ml  Output      0 ml  Net 1675.83 ml   Labs: Basic Metabolic Panel:  Lab 03/13/12 1610 03/12/12 1231  NA 140 142  K 3.5 3.1*  CL 94* 92*  CO2 34* 38*  GLUCOSE 94 134*  BUN 30* 15  CREATININE 5.57* 4.01*  CALCIUM 8.8 9.5  ALB -- --  PHOS 3.6 --   Liver Function Tests:  Lab 03/13/12 0320  AST --  ALT --  ALKPHOS --  BILITOT --  PROT --  ALBUMIN 3.0*   No results found for this basename: LIPASE:3,AMYLASE:3 in the last 168 hours No results found for this basename: AMMONIA:3 in the last 168 hours CBC:  Lab 03/13/12 0240 03/12/12 2250 03/12/12 2025 03/12/12 1558 03/12/12 1436 03/12/12 1231  WBC 4.5 5.3 5.3 -- -- --  NEUTROABS -- -- -- -- -- 3.1  HGB 5.9* 6.2* 6.4* -- -- --  HCT 18.2* 19.5* 20.0* -- -- --  MCV 123.8* 121.9* 122.7* 121.1* 121.3* --  PLT 185 192 225 -- -- --   Cardiac Enzymes:  Lab 03/13/12 0240 03/12/12 2031 03/12/12 1558  CKTOTAL -- -- --  CKMB -- -- --  CKMBINDEX -- -- --  TROPONINI 0.54* <0.30 <0.30   CBG: No results found for this basename: GLUCAP:5 in the last 168 hours  Iron Studies: No results found for this basename: IRON,TIBC,TRANSFERRIN,FERRITIN in the last 72 hours Studies/Results: Dg Chest Port 1 View  03/13/2012  *RADIOLOGY REPORT*  Clinical Data: Cough and fever.  PORTABLE CHEST - 1 VIEW  Comparison: 10/14/2011   Findings: Stable heart size and aortic tortuosity.  No evidence of pulmonary infiltrate, edema or pleural fluid.  IMPRESSION: No active disease.   Original Report Authenticated By: Irish Lack, M.D.    Medications: Infusions:    . pantoprozole (PROTONIX) infusion 8 mg/hr (03/13/12 0545)    Scheduled Medications:    . [COMPLETED] acetaminophen  500 mg Oral Once  . allopurinol  50 mg Oral BID  . atorvastatin  20 mg Oral q1800  . Chlorhexidine Gluconate Cloth  6 each Topical Q0600  . cinacalcet  30 mg Oral Q breakfast  . darbepoetin (ARANESP) injection - DIALYSIS  150 mcg Intravenous Q Tue-HD  . [COMPLETED] diphenhydrAMINE  25 mg Oral Once  . multivitamin  1 tablet Oral Daily  . mupirocin ointment  1 application Nasal BID  . paricalcitol  3 mcg Intravenous Q T,Th,Sa-HD  . PARoxetine  20 mg Oral Daily  . [COMPLETED] phytonadione  10 mg Oral Once  . sevelamer  2,400 mg Oral TID WC  . [COMPLETED] sodium chloride  250 mL Intravenous Once  . sodium chloride  3 mL Intravenous Q12H  . [  DISCONTINUED] amiodarone  200 mg Oral Daily  . [DISCONTINUED] pantoprazole (PROTONIX) IV  40 mg Intravenous Q12H  . [COMPLETED] sodium chloride  500 mL Intravenous Once    have reviewed scheduled and prn medications.  Physical Exam: General: looks good, no new complaints. Heart: RRR Lungs: mostly clear Abdomen: soft,nontender, nondistended Extremities: no edema Dialysis Access: left upper arm AVF   I Assessment/ Plan: Pt is a 71 y.o. yo male with ESRD who was admitted on 03/12/2012 with  Anemia and heme positive stools.   Assessment/Plan:.  1.GIB- presumed based on heme positive stools and falling hgb. He was on multiple anticoagulants. As of now, coumadin and effient are held. He is to receive 2 units PRBC, he will be placed on a protonix drip and will be monitored closely with eventual plans for EGD. Plans per primary team and GI.  There was a delay with blood so transfusion is not reflected in  the labs just yet.  Did get vitamin K , INR not yet down but would wait on FFP to avoid volume overload and it may not be needed.  Would check CBC later today and re-eval 2.ESRD - He received his full HD treatment Saturday. Normally TTS via AVF at the [place] [facility]. Plan will be to continue regular schedule unless events require treatment sooner due to hyperkalemia or fluid overload. No heparin with HD  3. Anemia - Chronic anemia due to ESRD along with ABL. For transfusion today, continue ESA  4. Bones- As OP is on sensipar, renvela 3 with meals as well as Zemplar. All of these meds will be continued inhouse.  5. Hypertension/volume- low BP at present, somewhat chronic but also maybe due to blood loss. Not able to remove fluid Saturday at HD. Holding BP meds, hopiong that transfusion will help BP. Keep in closely monitored setting. May need to reconsider the need for ACE and Beta blocker as OP 6. Fever- new, unknown etiology.  Patient makes no urine so will cancel I and O cath.  Agree with panculture, no antibiotics yet.      Roger [name] A   03/13/2012,8:33 AM  LOS: 1 day

## 2012-03-13 NOTE — Progress Notes (Signed)
INTERNAL MEDICINE TEACHING SERVICE Night Float Progress Note   Subjective:    Patient is feeling well overall, is only intermittently having chest pain (cannot tell when the last episode occurred). Otherwise denies lightheadedness, dizziness, shortness of breath.  Interval: I returned to the patient's room to again discuss his concerns about transfusion and if he would allow for transfusion in the setting of his worsening anemia. The patient requested discussion with his daughters to make ultimate decision. I therefore spoke with his daughter Prentice Docker regarding current state of his anemia (with declining Hgb levels to 6.2) and the recommendation to proceed with transfusion (given his CAD, worsening anemia), his warm auto-antibodies, possible risk of reaction including rash, hypersensitivity reaction, hemolytic response, anaphylaxis, amongst others.   The patient discussed with his daughter, and ultimately made the decision to proceed with the transfusion despite risks presented.   Objective:    BP 89/47  Pulse 74  Temp 100.4 F (38 C) (Oral)  Resp 18  Ht 5\' 8"  (1.727 m)  Wt 167 lb 8.8 oz (76 kg)  BMI 25.48 kg/m2  SpO2 96%   Labs: Labs: Basic Metabolic Panel:  Lab 03/12/12 1610  NA 142  K 3.1*  CL 92*  CO2 38*  GLUCOSE 134*  BUN 15  CREATININE 4.01*  CALCIUM 9.5  MG --  PHOS --    CBC:  Lab 03/12/12 2250 03/12/12 2025 03/12/12 1558 03/12/12 1436 03/12/12 1231  WBC 5.3 5.3 5.1 -- --  NEUTROABS -- -- -- -- 3.1  HGB 6.2* 6.4* 7.3* -- --  HCT 19.5* 20.0* 21.8* -- --  MCV 121.9* 122.7* 121.1* 121.3* 120.6*  PLT 192 225 208 -- --    Cardiac Enzymes:  Lab 03/12/12 2031 03/12/12 1558  CKTOTAL -- --  CKMB -- --  CKMBINDEX -- --  TROPONINI <0.30 <0.30    Coagulation Studies:  Basename 03/12/12 1214  LABPROT 25.5*  INR 2.46*     Physical Exam: General: Vital signs reviewed and noted. Well-developed, well-nourished, in no acute distress; alert, appropriate and  cooperative throughout examination.  Lungs:  Normal respiratory effort. Clear to auscultation BL without crackles or wheezes.  Heart: RRR. S1 and S2 normal without gallop, rubs. (+) systolic murmur.   Abdomen:  BS normoactive. Soft, Nondistended, non-tender.  No masses or organomegaly.  Extremities: No pretibial edema.     Assessment/ Plan:    Assessment: 1. 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 Patient is noted to have worsening anemia with admission Hgb of 7.6, now 6.2. He has NOT had recurrent episodes of bloody stools, and has NOT had recurrent chest pain. Troponins also remain negative.Of note, the pathologist paged [place] regarding the patient's type and cross. 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 The patient was informed of these results, and that still, given his clinical picture (hypotension, occasional CP, and worsening anemia), transfusion would still be recommended.   Vitamin K 10 mg po administered x 1.  Patient had discussion with his daughter, and now is consenting to proceed with transfusion.  I discussed with the daughter, Prentice Docker, rationale for recommending transfusion in addition to benefits and risks (as detailed above).   Plan:  Proceed with consenting patient  for transfusion - patient has agreed to proceed in spite of risks presented.  Will plan to premedicate patient with tylenol and benadryl 30 minutes before transfusion.  Will need to monitor patient closely for 4 hours post-transfusion.  DC NS bolus.  Pending blood cultures.  Hold amiodarone.  Ask RN to inform team if recurrent fevers.  Patient instructed to notify RN IMMEDIATELY if recurrent  bloody stools/ black stools, chest pain, shortness of breath, lightheadedness, dizziness.  Continue to cycle CE.  Add AM EKG.    Signed: Johnette Abraham, Roma Schanz, Internal Medicine Resident Pager: [phone] 03/13/2012, 2:48 AM

## 2012-03-14 ENCOUNTER — Encounter (HOSPITAL_COMMUNITY)
Admission: EM | Disposition: A | Discharge status: Home / Self Care | Payer: Self-pay | Source: Home / Self Care | Attending: Internal Medicine

## 2012-03-14 ENCOUNTER — Encounter (HOSPITAL_COMMUNITY): Payer: Self-pay | Admitting: Nephrology

## 2012-03-14 DIAGNOSIS — I209 Angina pectoris, unspecified: Secondary | ICD-10-CM

## 2012-03-14 DIAGNOSIS — R1319 Other dysphagia: Secondary | ICD-10-CM

## 2012-03-14 DIAGNOSIS — Q393 Congenital stenosis and stricture of esophagus: Secondary | ICD-10-CM

## 2012-03-14 DIAGNOSIS — K222 Esophageal obstruction: Secondary | ICD-10-CM | POA: Insufficient documentation

## 2012-03-14 HISTORY — PX: ESOPHAGOGASTRODUODENOSCOPY: SHX5428

## 2012-03-14 LAB — TROPONIN I: Troponin I: 2.13 ng/mL (ref <0.30)

## 2012-03-14 LAB — PREPARE FRESH FROZEN PLASMA: Unit division: 0

## 2012-03-14 LAB — CBC
HCT: 21.4 % — ABNORMAL LOW (ref 39.0–52.0)
HCT: 22.3 % — ABNORMAL LOW (ref 39.0–52.0)
Hemoglobin: 7.2 g/dL — ABNORMAL LOW (ref 13.0–17.0)
MCH: 35.6 pg — ABNORMAL HIGH (ref 26.0–34.0)
MCH: 36.4 pg — ABNORMAL HIGH (ref 26.0–34.0)
MCV: 108.3 fL — ABNORMAL HIGH (ref 78.0–100.0)
RBC: 2.02 MIL/uL — ABNORMAL LOW (ref 4.22–5.81)
RBC: 2.06 MIL/uL — ABNORMAL LOW (ref 4.22–5.81)
RDW: 26.3 % — ABNORMAL HIGH (ref 11.5–15.5)
WBC: 4.6 10*3/uL (ref 4.0–10.5)

## 2012-03-14 LAB — BASIC METABOLIC PANEL
BUN: 59 mg/dL — ABNORMAL HIGH (ref 6–23)
CO2: 32 mEq/L (ref 19–32)
Calcium: 9.4 mg/dL (ref 8.4–10.5)
Glucose, Bld: 99 mg/dL (ref 70–99)
Sodium: 139 mEq/L (ref 135–145)

## 2012-03-14 LAB — PREPARE RBC (CROSSMATCH)

## 2012-03-14 SURGERY — EGD (ESOPHAGOGASTRODUODENOSCOPY)
Anesthesia: Moderate Sedation

## 2012-03-14 MED ORDER — SODIUM CHLORIDE 0.9 % IV SOLN: INTRAVENOUS | Status: DC

## 2012-03-14 MED ORDER — FENTANYL CITRATE 0.05 MG/ML IJ SOLN
INTRAMUSCULAR | Status: DC | PRN
  Administered 2012-03-14 (×2): 25 ug via INTRAVENOUS

## 2012-03-14 MED ORDER — SODIUM CHLORIDE 0.9 % IV SOLN
INTRAVENOUS | Status: DC
  Administered 2012-03-16: 13:00:00 via INTRAVENOUS

## 2012-03-14 MED ORDER — ASPIRIN 81 MG PO TABS: 81.0000 mg | ORAL_TABLET | Freq: Every day | ORAL | Status: DC

## 2012-03-14 MED ORDER — METOPROLOL TARTRATE 12.5 MG HALF TABLET
12.5000 mg | ORAL_TABLET | Freq: Two times a day (BID) | ORAL | Status: DC
  Administered 2012-03-14: 12.5 mg via ORAL
  Filled 2012-03-14 (×2): qty 1

## 2012-03-14 MED ORDER — FENTANYL CITRATE 0.05 MG/ML IJ SOLN
INTRAMUSCULAR | Status: AC
  Filled 2012-03-14: qty 2

## 2012-03-14 MED ORDER — DIPHENHYDRAMINE HCL 50 MG/ML IJ SOLN
INTRAMUSCULAR | Status: AC
  Filled 2012-03-14: qty 1

## 2012-03-14 MED ORDER — ASPIRIN 81 MG PO CHEW
81.0000 mg | CHEWABLE_TABLET | Freq: Every day | ORAL | Status: DC
  Administered 2012-03-14 – 2012-03-17 (×4): 81 mg via ORAL
  Filled 2012-03-14 (×3): qty 1

## 2012-03-14 MED ORDER — BUTAMBEN-TETRACAINE-BENZOCAINE 2-2-14 % EX AERO
INHALATION_SPRAY | CUTANEOUS | Status: DC | PRN
  Administered 2012-03-14: 2 via TOPICAL

## 2012-03-14 MED ORDER — MIDAZOLAM HCL 5 MG/ML IJ SOLN
INTRAMUSCULAR | Status: AC
  Filled 2012-03-14: qty 2

## 2012-03-14 MED ORDER — MIDAZOLAM HCL 10 MG/2ML IJ SOLN
INTRAMUSCULAR | Status: DC | PRN
  Administered 2012-03-14 (×2): 2 mg via INTRAVENOUS

## 2012-03-14 MED ORDER — METOPROLOL TARTRATE 12.5 MG HALF TABLET: 12.5000 mg | ORAL_TABLET | Freq: Two times a day (BID) | ORAL | Status: DC

## 2012-03-14 NOTE — Progress Notes (Addendum)
Subjective: Patient reports black tarry stool yesterday (RN says it was large). Patient has been having black tarry stools for 1 week. He reports dysphagia with solids and liquids x 6 months. Denies chest pain, denies shortness of breath, dizziness/lightheadedness.   Objective: Vital signs in last 24 hours: Filed Vitals:   03/14/12 0300 03/14/12 0311 03/14/12 0500 03/14/12 0730  BP: 94/49  99/52 101/51  Pulse: 59 58 60 62  Temp:  98.5 F (36.9 C)  98.1 F (36.7 C)  TempSrc:  Oral  Oral  Resp: 19 17 17 20   Height:      Weight:  177 lb 7.5 oz (80.5 kg)    SpO2: 100% 100% 99% 99%   Weight change: 9 lb 14.7 oz (4.5 kg)  Intake/Output Summary (Last 24 hours) at 03/14/12 1113 Last data filed at 03/14/12 1000  Gross per 24 hour  Intake    495 ml  Output      0 ml  Net    495 ml   General: lying in bed alert and oriented, no distress HEENT: [place]/at CV:  +systolic murmur, normal rate  Lungs: ctab Abdomen: soft, ntnd, +hypoactive bs Extremities: warm, no cyanosis or edema  Neuro: CN 2-12 grossly intact, moving all 4 extremities.   Lab Results: Basic Metabolic Panel:  Lab 03/14/12 1610 03/13/12 0320  NA 139 140  K 4.7 3.5  CL 93* 94*  CO2 32 34*  GLUCOSE 99 94  BUN 59* 30*  CREATININE 8.84* 5.57*  CALCIUM 9.4 8.8  MG 2.1 --  PHOS -- 3.6   Liver Function Tests:  Lab 03/13/12 0320  AST --  ALT --  ALKPHOS --  BILITOT --  PROT --  ALBUMIN 3.0*   CBC:  Lab 03/14/12 0927 03/13/12 1840 03/12/12 1231  WBC 4.4 4.2 --  NEUTROABS -- -- 3.1  HGB 7.2* 7.7* --  HCT 21.4* 23.0* --  MCV 105.9* 106.5* --  PLT 167 168 --   Cardiac Enzymes:  Lab 03/14/12 0927 03/13/12 1848 03/13/12 1300  CKTOTAL -- -- --  CKMB -- -- --  CKMBINDEX -- -- --  TROPONINI 2.13* 2.14* 2.52*   Coagulation:  Lab 03/14/12 0927 03/13/12 1247 03/13/12 0240 03/12/12 1214  LABPROT 16.4* 18.9* 24.7* 25.5*  INR 1.35 1.64* 2.35* 2.46*   Misc. Labs: none  Micro Results: Recent Results (from the  past 240 hour(s))  MRSA PCR SCREENING     Status: Abnormal   Collection Time   03/12/12  5:54 PM      Component Value Range Status Comment   MRSA by PCR POSITIVE (*) NEGATIVE Final   CULTURE, BLOOD (ROUTINE X 2)     Status: Normal (Preliminary result)   Collection Time   03/12/12  9:50 PM      Component Value Range Status Comment   Specimen Description BLOOD RIGHT HAND   Final    Special Requests BOTTLES DRAWN AEROBIC ONLY 8CC   Final    Culture  Setup Time 03/13/2012 03:58   Final    Culture     Final    Value:        BLOOD CULTURE RECEIVED NO GROWTH TO DATE CULTURE WILL BE HELD FOR 5 DAYS BEFORE ISSUING A FINAL NEGATIVE REPORT   Report Status PENDING   Incomplete   CULTURE, BLOOD (ROUTINE X 2)     Status: Normal (Preliminary result)   Collection Time   03/12/12  9:56 PM      Component Value Range  Status Comment   Specimen Description BLOOD LEFT ARM   Final    Special Requests BOTTLES DRAWN AEROBIC ONLY 5CC   Final    Culture  Setup Time 03/13/2012 03:58   Final    Culture     Final    Value:        BLOOD CULTURE RECEIVED NO GROWTH TO DATE CULTURE WILL BE HELD FOR 5 DAYS BEFORE ISSUING A FINAL NEGATIVE REPORT   Report Status PENDING   Incomplete    Studies/Results: Dg Chest Port 1 View  03/13/2012  *RADIOLOGY REPORT*  Clinical Data: Cough and fever.  PORTABLE CHEST - 1 VIEW  Comparison: 10/14/2011  Findings: Stable heart size and aortic tortuosity.  No evidence of pulmonary infiltrate, edema or pleural fluid.  IMPRESSION: No active disease.   Original Report Authenticated By: Irish Lack, M.D.    Medications:  Scheduled Meds:    . allopurinol  50 mg Oral BID  . atorvastatin  20 mg Oral q1800  . Chlorhexidine Gluconate Cloth  6 each Topical Q0600  . cinacalcet  30 mg Oral Q breakfast  . darbepoetin (ARANESP) injection - DIALYSIS  150 mcg Intravenous Q Tue-HD  . metoprolol tartrate  12.5 mg Oral BID  . multivitamin  1 tablet Oral Daily  . mupirocin ointment  1 application  Nasal BID  . paricalcitol  3 mcg Intravenous Q T,Th,Sa-HD  . PARoxetine  20 mg Oral Daily  . sevelamer  2,400 mg Oral TID WC  . sodium chloride  3 mL Intravenous Q12H  . [DISCONTINUED] metoprolol tartrate  12.5 mg Oral BID   Continuous Infusions:    . 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    1.  Acute blood loss anemia suspect due to GI bleeding -With unknown source suspect GIB.  FOBT + this admission.  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  Source of bleeding likely AVM due to melena less likely hemorrhoids. No evidence of gross blood this admission but having black tarry stools.   -Hemoglobin trended down to 5.9 s/p 2 units pRBCs trended up to 8.2 then down to 7.2 this am  -goal hbg 10 with cardiac history, monitoring cbc q8 hours  -GI consulted for endoscopy/colonoscopy and will do endoscopy today without dilation. No dilation or colonoscopy currently due to the patient being on Effient, Coumadin -holding and Effient Coumadin per Dr. Herbie Baltimore -Careful with transfusions (of note the patient has warm antibodies)-->called blood bank today and 2 units of blood on hold if needed    -Continue Protonix gtt   2. NSTEMI (Type II MI due to anemia with existing CAD) -0.54 trended up to 2.52, trending down to 2.13 -Etiology due to NSTEMI as there were ST depressions in V2-V6 on EKG which are now improving -Patient has been  asymptomatic without chest pain  -Cardiology (Dr. Herbie [place]) consulted and aware -will cycle enzymes until trending down  3. Hypotension with history of Hypertension -BP improving 90s-100s/50s -Etiology most likely secondary to GIB loss.  -will continue to  monitor. Holding enalapril 10 mg qd, Amiodarone 200 mg qd, Imdur EF 30 mg, BB -with cardiac history cards recommends adding BB, Imdur, ACEI when BP improves -will add Metoprolol 12.5 mg bid today to hold if HR <55.    4. History of CAD and grade 2 diastolic dysfunction -previous DES stenting mid LAD and balloon angioplasty of diagonal branch 09/2011.  History of NSTEMI CFX BMS 12/11 with repeat overlapping DES to left cicumflex (as well as OM) May 2013 (08/22/2011) after NSTEMI -08/2011 EF 50-55% with grade 2 diastolic dysfunction -discontinued Effient and Coumadin secondary to bleeding. -He will f/u outpatient with cardiology for management and possibly consider Plavix but need to re-evaluate P2Y12 levels or Brilinta if the patient can afford -cards following for recs. Recommend at least Aspirin if GI agrees and will ask GI today   5. MRSA + nares -protocol and contact precautions  6. 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 Advised risk of stroke off Coumadin but currently risk of bleeding outweighs benefits  -holding Coumadin and Amiodarone currently.  Cards rec resume Amiodarone -continue to monitor via tele  8. F/E/N -will monitor daily labs -NPO pending GI procedure 03/14/12 then resume diet  9. DVT Px -none due to bleeding    Dispo: Disposition is deferred at this time, awaiting improvement of current medical problems.  Anticipated discharge in approximately 2-3 day(s).   The patient does have a current PCP (Butch Penny G, MD), therefore will be requiring OPC follow-up after discharge with him.   The  patient does not have transportation limitations that hinder transportation to clinic appointments.  .Services Needed at time of discharge: Y = Yes, Blank = No PT:   OT:   RN:   Equipment:   Other:     LOS: 2 days   Annett Gula [phone] 03/14/2012, 11:13 AM

## 2012-03-14 NOTE — Progress Notes (Signed)
Subjective:   Fevers better, resolving. Has had 2u prbc's and feels a lot better. No sob or cp, no bloody stool per pt  Objective Vital signs in last 24 hours: Filed Vitals:   03/14/12 0311 03/14/12 0500 03/14/12 0730 03/14/12 1200  BP:  99/52 101/51 110/62  Pulse: 58 60 62 60  Temp: 98.5 F (36.9 C)  98.1 F (36.7 C) 98.3 F (36.8 C)  TempSrc: Oral  Oral Oral  Resp: 17 17 20 16   Height:      Weight: 80.5 kg (177 lb 7.5 oz)     SpO2: 100% 99% 99% 100%   Weight change: 4.5 kg (9 lb 14.7 oz)  Intake/Output Summary (Last 24 hours) at 03/14/12 1413 Last data filed at 03/14/12 1200  Gross per 24 hour  Intake    565 ml  Output      0 ml  Net    565 ml   Labs: Basic Metabolic Panel:  Lab 03/14/12 1610 03/13/12 0320 03/12/12 1231  NA 139 140 142  K 4.7 3.5 3.1*  CL 93* 94* 92*  CO2 32 34* 38*  GLUCOSE 99 94 134*  BUN 59* 30* 15  CREATININE 8.84* 5.57* 4.01*  CALCIUM 9.4 8.8 9.5  ALB -- -- --  PHOS -- 3.6 --   Liver Function Tests:  Lab 03/13/12 0320  AST --  ALT --  ALKPHOS --  BILITOT --  PROT --  ALBUMIN 3.0*   No results found for this basename: LIPASE:3,AMYLASE:3 in the last 168 hours No results found for this basename: AMMONIA:3 in the last 168 hours CBC:  Lab 03/14/12 0927 03/13/12 1840 03/13/12 1218 03/13/12 0240 03/12/12 2250 03/12/12 1231  WBC 4.4 4.2 4.6 -- -- --  NEUTROABS -- -- -- -- -- 3.1  HGB 7.2* 7.7* 8.2* -- -- --  HCT 21.4* 23.0* 25.0* -- -- --  MCV 105.9* 106.5* 106.4* 123.8* 121.9* --  PLT 167 168 182 -- -- --   Cardiac Enzymes:  Lab 03/14/12 0927 03/13/12 1848 03/13/12 1300 03/13/12 0913 03/13/12 0240  CKTOTAL -- -- -- -- --  CKMB -- -- -- -- --  CKMBINDEX -- -- -- -- --  TROPONINI 2.13* 2.14* 2.52* 2.19* 0.54*   CBG: No results found for this basename: GLUCAP:5 in the last 168 hours  Iron Studies: No results found for this basename: IRON,TIBC,TRANSFERRIN,FERRITIN in the last 72 hours Studies/Results: Dg Chest Port 1  View  03/13/2012  *RADIOLOGY REPORT*  Clinical Data: Cough and fever.  PORTABLE CHEST - 1 VIEW  Comparison: 10/14/2011  Findings: Stable heart size and aortic tortuosity.  No evidence of pulmonary infiltrate, edema or pleural fluid.  IMPRESSION: No active disease.   Original Report Authenticated By: Irish Lack, M.D.    Medications: Infusions:    . sodium chloride 10 mL/hr at 03/14/12 1130  . pantoprozole (PROTONIX) infusion 8 mg/hr (03/14/12 0748)    Scheduled Medications:    . allopurinol  50 mg Oral BID  . atorvastatin  20 mg Oral q1800  . Chlorhexidine Gluconate Cloth  6 each Topical Q0600  . cinacalcet  30 mg Oral Q breakfast  . darbepoetin (ARANESP) injection - DIALYSIS  150 mcg Intravenous Q Tue-HD  . metoprolol tartrate  12.5 mg Oral BID  . multivitamin  1 tablet Oral Daily  . mupirocin ointment  1 application Nasal BID  . paricalcitol  3 mcg Intravenous Q T,Th,Sa-HD  . PARoxetine  20 mg Oral Daily  . sevelamer  2,400  mg Oral TID WC  . sodium chloride  3 mL Intravenous Q12H  . [DISCONTINUED] metoprolol tartrate  12.5 mg Oral BID    have reviewed scheduled and prn medications.  Physical Exam: General: looks good, no new complaints. Heart: RRR Lungs: mostly clear Abdomen: soft,nontender, nondistended Extremities: no edema Dialysis Access: left upper arm AVF   HD orders - [place] TTS, AVF, 3 hours and 15 min, 2 K, 2.25 calc bath, EDW 76 kg, zemplar 3 mcg per HD and epo 19,800 units per HD, heparin 2000 units per treatment.  Assessment/ Plan: Pt is a 71 y.o. yo male with ESRD who was admitted on 03/12/2012 with  Anemia and heme positive stools.     1. GIB- presumed based on heme+ stools; Hb 5.9 on admit, then up to 8.2, now back down to 7.2. Will likely need further transfusion. Coumadin and Effient on hold, getting ASA now. INR down to 1.3.  2. ESRD, cont tts HD- HD tomorrow 3. Anemia - Chronic anemia due to ESRD along with ABL. 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 All of these meds will be continued inhouse.  5. HTN/volume- low BP on admission in 70's, now up to 110/62, home BP meds on hold (lopressor and vasotec), may need to reconsider the need for ACE and Beta blocker as OP 6. Fever- new, unknown etiology.  Patient makes no urine so will cancel I and O cath.  Agree with panculture, no antibiotics yet.  7. Aifb- on ASA now 8. Hx CAD with stents      Dazani Norby D   03/14/2012,2:13 PM  LOS: 2 days

## 2012-03-14 NOTE — Progress Notes (Signed)
Subjective: Hgb drifting down again. To transfuse. No chest pain or SOB  Objective: Vital signs in last 24 hours: Temp:  [98.1 F (36.7 C)-98.8 F (37.1 C)] 98.1 F (36.7 C) (12/02 0730) Pulse Rate:  [58-67] 62  (12/02 0730) Resp:  [16-21] 20  (12/02 0730) BP: (94-104)/(49-56) 101/51 mmHg (12/02 0730) SpO2:  [99 %-100 %] 99 % (12/02 0730) Weight:  [80.5 kg (177 lb 7.5 oz)] 80.5 kg (177 lb 7.5 oz) (12/02 0311) Weight change: 4.5 kg (9 lb 14.7 oz) Last BM Date: 03/13/12 Intake/Output from previous day: +1683 12/01 0701 - 12/02 0700 In: 407.5 [P.O.:120; I.V.:275; Blood:12.5] Out: -  Intake/Output this shift: Total I/O In: 75 [I.V.:75] Out: -   PE: General:alert and oriented, no complaints, feels better, MAE Heart:S1S2 RRR Lungs:clear without rales or rhonchi Abd:+ BS, soft, non tender Ext:no edema   Lab Results:  Basename 03/13/12 1840 03/13/12 1218  WBC 4.2 4.6  HGB 7.7* 8.2*  HCT 23.0* 25.0*  PLT 168 182   BMET  Basename 03/13/12 0320 03/12/12 1231  NA 140 142  K 3.5 3.1*  CL 94* 92*  CO2 34* 38*  GLUCOSE 94 134*  BUN 30* 15  CREATININE 5.57* 4.01*  CALCIUM 8.8 9.5    Basename 03/13/12 1848 03/13/12 1300  TROPONINI 2.14* 2.52*    Hepatic Function Panel  Basename 03/13/12 0320  PROT --  ALBUMIN 3.0*  AST --  ALT --  ALKPHOS --  BILITOT --  BILIDIR --  IBILI --    Studies/Results: Dg Chest Port 1 View  03/13/2012  *RADIOLOGY REPORT*  Clinical Data: Cough and fever.  PORTABLE CHEST - 1 VIEW  Comparison: 10/14/2011  Findings: Stable heart size and aortic tortuosity.  No evidence of pulmonary infiltrate, edema or pleural fluid.  IMPRESSION: No active disease.   Original Report Authenticated By: Irish Lack, M.D.    ECG: NSR, 62, less dramatic Anterolateral ST down-sloping ST depression / TWI  Medications: I have reviewed the patient's current medications.    [name] allopurinol  50 mg Oral BID  . atorvastatin  20 mg Oral q1800  . Chlorhexidine  Gluconate Cloth  6 each Topical Q0600  . cinacalcet  30 mg Oral Q breakfast  . darbepoetin (ARANESP) injection - DIALYSIS  150 mcg Intravenous Q Tue-HD  . multivitamin  1 tablet Oral Daily  . mupirocin ointment  1 application Nasal BID  . paricalcitol  3 mcg Intravenous Q T,Th,Sa-HD  . PARoxetine  20 mg Oral Daily  . sevelamer  2,400 mg Oral TID WC  . sodium chloride  3 mL Intravenous Q12H   Assessment/Plan: Principal Problem:  *Acute blood loss anemia Active Problems:  NSTEMI (non-ST elevated myocardial infarction) - Type II MI due to Anemia with existing CAD  S/P coronary artery stent placement, 08/2011 -- 4 overlapping DES to LCx-OM for ~occluded (ISR/thrombosis) BMS to LCx-OM; 10/01/11 with DES to mid LAD and  PTCA of Diag 2  Hypotension  HTN (hypertension)  End stage renal disease on dialysis; HD T, Th Sat  CAD, MI CFX BMS 12/11 with ISR DES (as well as OM) May 2013  Anticoagulated on warfarin  GI bleeding  Angina pectoris  PLAN: Pk troponin 2.52,  Recheck EKG.  BP soft.   INR reducing.  On IV protonix .  For EGD today.   ? Demand ischemia NSTEMI  LOS: 2 days   HARDING,DAVID W 03/14/2012, 9:09 AM  I have seen and evaluated the patient this morning along with  Nada Boozer, NP. I agree with his/her findings, examination as well as impression recommendations. + Melanic stool  Feels better.  No CP or SOB.  ECG improving & Troponin trending down from Type II MI. Hgb with ~appropriate increase after 2 Units for baseline of 5.9 up to 7.7.  INR < 2.0  BP is better, but still borderline hypotensive.  HR is thankfully stable in 60s.  No Afib -- off Warfarin indefinitely.  Stable from a cardiac standpoint for GI procedures.  Provided no recurrent angina, would simple consider Myoview as OP to eval for anterolateral ischemia -- the major Diagonal branch is a likely culprit vessel.    Currently holding Effient until souce of bleeding found & treated.  If OK with GI, would like to use  at least ASA 81mg  while off Effient.  Marykay Lex, M.D., M.S. [facility] [address] [place], [place]  [address]  [phone] Pager # [phone] 03/14/2012 9:18 AM

## 2012-03-14 NOTE — Op Note (Signed)
[facility] [address] [place], [address]   ENDOSCOPY PROCEDURE REPORT  PATIENT: Roger, [name]  MR#: [id number] BIRTHDATE: June 22, 1940 , 71  yrs. old GENDER: Male ENDOSCOPIST: Iva Boop, MD, [facility] PROCEDURE DATE:  03/14/2012 PROCEDURE:  EGD w/ biopsy ASA CLASS:     Class III INDICATIONS:  Anemia.   Heme positive stool. MEDICATIONS: Fentanyl 50 mcg IV and Versed 4 mg IV TOPICAL ANESTHETIC: Cetacaine Spray  DESCRIPTION OF PROCEDURE: After the risks benefits and alternatives of the procedure were thoroughly explained, informed consent was obtained.  The Pentax Gastroscope [id number] endoscope was introduced through the mouth and advanced to the second portion of the duodenum. Without limitations.  The instrument was slowly withdrawn as the mucosa was fully examined.        ESOPHAGUS: A Schatzki ring was found at the gastroesophageal junction and was widely open.  Multiple biopsies were performed using cold forceps - to disrupt the ring.   A 5 cm hiatal hernia was noted.  The remainder of the upper endoscopy exam was otherwise normal. Retroflexed views revealed a hiatal hernia.     The scope was then withdrawn from the patient and the procedure completed.  COMPLICATIONS: There were no complications. ENDOSCOPIC IMPRESSION: 1.   Schatzki ring was found at the gastroesophageal junction; multiple biopsies used to disrupt the ring (recent Effient) 2.   5 cm hiatal hernia 3.   The remainder of the upper endoscopy exam was otherwise normal  RECOMMENDATIONS: colonoscopy - probably will be negative as he had one in 2011 but think prudent to exclude lesions that may bleed. Combo of Effient and warfarin likely making small bowle AVM's bleed.   eSigned:  Iva Boop, MD, [facility] 03/14/2012 3:44 PM revisd

## 2012-03-14 NOTE — Progress Notes (Signed)
     [facility] Daily Rounding Note 03/14/2012, 10:56 AM  SUBJECTIVE:       Large tarry stool last night.  Nothing since then.  No nausea or abdominal pain  OBJECTIVE:         Vital signs in last 24 hours:    Temp:  [98.1 F (36.7 C)-98.8 F (37.1 C)] 98.1 F (36.7 C) (12/02 0730) Pulse Rate:  [58-67] 62  (12/02 0730) Resp:  [16-21] 20  (12/02 0730) BP: (94-104)/(49-56) 101/51 mmHg (12/02 0730) SpO2:  [99 %-100 %] 99 % (12/02 0730) Weight:  [80.5 kg (177 lb 7.5 oz)] 80.5 kg (177 lb 7.5 oz) (12/02 0311) Last BM Date: 03/13/12 General: pale. No resp distress.  Relaxed.    Not reexamined.   Intake/Output from previous day: 12/01 0701 - 12/02 0700 In: 407.5 [P.O.:120; I.V.:275; Blood:12.5] Out: -   Intake/Output this shift: Total I/O In: 100 [I.V.:100] Out: -   Lab Results:  Basename 03/14/12 0927 03/13/12 1840 03/13/12 1218  WBC 4.4 4.2 4.6  HGB 7.2* 7.7* 8.2*  HCT 21.4* 23.0* 25.0*  PLT 167 168 182   BMET  Basename 03/14/12 0927 03/13/12 0320 03/12/12 1231  NA 139 140 142  K 4.7 3.5 3.1*  CL 93* 94* 92*  CO2 32 34* 38*  GLUCOSE 99 94 134*  BUN 59* 30* 15  CREATININE 8.84* 5.57* 4.01*  CALCIUM 9.4 8.8 9.5   LFT  Basename 03/13/12 0320  PROT --  ALBUMIN 3.0*  AST --  ALT --  ALKPHOS --  BILITOT --  BILIDIR --  IBILI --   PT/INR  Basename 03/14/12 0927 03/13/12 1247  LABPROT 16.4* 18.9*  INR 1.35 1.64*    Studies/Results: Dg Chest Port 1 View 03/13/2012  *RADIOLOGY REPORT*  Clinical Data: Cough and fever.  PORTABLE CHEST - 1 VIEW  Comparison: 10/14/2011  Findings: Stable heart size and aortic tortuosity.  No evidence of pulmonary infiltrate, edema or pleural fluid.  IMPRESSION: No active disease.   Original Report Authenticated By: Irish Lack, M.D.     ASSESMENT: *  Gi bleed:  Melenic stool and progressive anemia. Pt on coumadin and Effient, both on hold.  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  [place] 03/2010:  Erosion vs sb  avm.  *  Dysphagia.  Hx Schatzkis ring.  *  A fib.   *  CAD.  PCI 09/2011.  chronic Coumadin and effient, on hold.  INR safe to proceed with EGD. *  ESRD   PLAN: *  EGD this afternoon.  Pt and family aware and aggreable.    LOS: 2 days   Jennye Moccasin  03/14/2012, 10:56 AM Pager: [phone]

## 2012-03-14 NOTE — Progress Notes (Signed)
Internal Medicine Teaching Service Attending Note Date: 03/14/2012  Patient name: RITVIK POPOCA  Medical record number: [id number]  Date of birth: 05/07/1940    This patient has been seen and discussed with the house staff. Please see their note for complete details. I concur with their findings with the following additions/corrections: This is a difficult situation overall care he have to balance between the risk of bleeding while patient is on no antiplatelet therapy and Coumadin for his atrial fibrillation versus the risk of stroke and stent thrombosis which may occur if we decide to discontinue all her degrees his antiplatelet therapy and anticoagulation. I appreciate input from Dr. Herbie Baltimore and gastroenterology in this case. We will start aspirin 81 mg today while effient and Coumadin is still on hold. Patient was on ACE inhibitor, nitrate and beta blocker at home. Given that patient's blood pressure is more stable, we will restart his home dose beta blocker at a very low dose today. Patient's hemoglobin is up after 2 units of transfusion. We will have to 2 more units ready to be transfused in case patient continues to drop his hemoglobin.  Lars Mage 03/14/2012, 1:55 PM

## 2012-03-15 ENCOUNTER — Encounter (HOSPITAL_COMMUNITY): Payer: Self-pay | Admitting: Internal Medicine

## 2012-03-15 DIAGNOSIS — I350 Nonrheumatic aortic (valve) stenosis: Secondary | ICD-10-CM | POA: Diagnosis present

## 2012-03-15 DIAGNOSIS — R509 Fever, unspecified: Secondary | ICD-10-CM | POA: Insufficient documentation

## 2012-03-15 LAB — CBC WITH DIFFERENTIAL/PLATELET
Basophils Absolute: 0 10*3/uL (ref 0.0–0.1)
Basophils Relative: 0 % (ref 0–1)
Eosinophils Absolute: 0.3 10*3/uL (ref 0.0–0.7)
Eosinophils Absolute: 0.3 10*3/uL (ref 0.0–0.7)
Eosinophils Relative: 7 % — ABNORMAL HIGH (ref 0–5)
HCT: 24.5 % — ABNORMAL LOW (ref 39.0–52.0)
Hemoglobin: 8.2 g/dL — ABNORMAL LOW (ref 13.0–17.0)
Lymphocytes Relative: 21 % (ref 12–46)
Lymphs Abs: 1 10*3/uL (ref 0.7–4.0)
Lymphs Abs: 0.8 10*3/uL (ref 0.7–4.0)
MCH: 35 pg — ABNORMAL HIGH (ref 26.0–34.0)
MCH: 34.7 pg — ABNORMAL HIGH (ref 26.0–34.0)
MCHC: 33.5 g/dL (ref 30.0–36.0)
MCHC: 33 g/dL (ref 30.0–36.0)
MCV: 105.2 fL — ABNORMAL HIGH (ref 78.0–100.0)
Monocytes Absolute: 0.4 10*3/uL (ref 0.1–1.0)
Monocytes Absolute: 0.6 10*3/uL (ref 0.1–1.0)
Neutro Abs: 3 10*3/uL (ref 1.7–7.7)
Neutrophils Relative %: 58 % (ref 43–77)
Platelets: 150 10*3/uL (ref 150–400)
RBC: 2.13 MIL/uL — ABNORMAL LOW (ref 4.22–5.81)
RDW: 26.7 % — ABNORMAL HIGH (ref 11.5–15.5)
RDW: 26.8 % — ABNORMAL HIGH (ref 11.5–15.5)

## 2012-03-15 LAB — RENAL FUNCTION PANEL
Albumin: 3.1 g/dL — ABNORMAL LOW (ref 3.5–5.2)
BUN: 21 mg/dL (ref 6–23)
Calcium: 8.9 mg/dL (ref 8.4–10.5)
Chloride: 97 mEq/L (ref 96–112)
Creatinine, Ser: 4.17 mg/dL — ABNORMAL HIGH (ref 0.50–1.35)
GFR calc non Af Amer: 13 mL/min — ABNORMAL LOW (ref >90)

## 2012-03-15 LAB — TROPONIN I: Troponin I: 0.73 ng/mL (ref <0.30)

## 2012-03-15 LAB — BASIC METABOLIC PANEL
Calcium: 9.1 mg/dL (ref 8.4–10.5)
Creatinine, Ser: 10.24 mg/dL — ABNORMAL HIGH (ref 0.50–1.35)
GFR calc non Af Amer: 4 mL/min — ABNORMAL LOW (ref >90)
Glucose, Bld: 131 mg/dL — ABNORMAL HIGH (ref 70–99)
Sodium: 135 mEq/L (ref 135–145)

## 2012-03-15 MED ORDER — METOCLOPRAMIDE HCL 5 MG/ML IJ SOLN
10.0000 mg | Freq: Once | INTRAMUSCULAR | Status: AC
  Administered 2012-03-15: 10 mg via INTRAVENOUS
  Filled 2012-03-15: qty 2

## 2012-03-15 MED ORDER — PEG 3350-KCL-NA BICARB-NACL 420 G PO SOLR
2000.0000 mL | Freq: Once | ORAL | Status: AC
  Administered 2012-03-15: 2000 mL via ORAL
  Filled 2012-03-15: qty 4000

## 2012-03-15 MED ORDER — PARICALCITOL 5 MCG/ML IV SOLN
INTRAVENOUS | Status: AC
  Administered 2012-03-15: 3 ug via INTRAVENOUS
  Filled 2012-03-15: qty 1

## 2012-03-15 MED ORDER — PANTOPRAZOLE SODIUM 40 MG PO TBEC
40.0000 mg | DELAYED_RELEASE_TABLET | Freq: Every day | ORAL | Status: DC
  Administered 2012-03-16 – 2012-03-17 (×2): 40 mg via ORAL
  Filled 2012-03-15 (×2): qty 1

## 2012-03-15 MED ORDER — NEPRO/CARBSTEADY PO LIQD: 237.0000 mL | ORAL | Status: DC | PRN

## 2012-03-15 MED ORDER — METOCLOPRAMIDE HCL 5 MG/ML IJ SOLN
10.0000 mg | Freq: Once | INTRAMUSCULAR | Status: AC
  Administered 2012-03-16: 10 mg via INTRAVENOUS
  Filled 2012-03-15: qty 2

## 2012-03-15 MED ORDER — ALTEPLASE 2 MG IJ SOLR: 2.0000 mg | Freq: Once | INTRAMUSCULAR | Status: DC | PRN

## 2012-03-15 MED ORDER — BISACODYL 5 MG PO TBEC
5.0000 mg | DELAYED_RELEASE_TABLET | Freq: Three times a day (TID) | ORAL | Status: AC
  Administered 2012-03-15 (×2): 5 mg via ORAL
  Filled 2012-03-15 (×2): qty 1

## 2012-03-15 MED ORDER — LIDOCAINE HCL (PF) 1 % IJ SOLN: 5.0000 mL | INTRAMUSCULAR | Status: DC | PRN

## 2012-03-15 MED ORDER — LIDOCAINE-PRILOCAINE 2.5-2.5 % EX CREA: 1.0000 "application " | TOPICAL_CREAM | CUTANEOUS | Status: DC | PRN

## 2012-03-15 MED ORDER — SODIUM CHLORIDE 0.9 % IV SOLN: 100.0000 mL | INTRAVENOUS | Status: DC | PRN

## 2012-03-15 MED ORDER — HEPARIN SODIUM (PORCINE) 1000 UNIT/ML DIALYSIS: 1000.0000 [IU] | INTRAMUSCULAR | Status: DC | PRN

## 2012-03-15 MED ORDER — PENTAFLUOROPROP-TETRAFLUOROETH EX AERO: 1.0000 "application " | INHALATION_SPRAY | CUTANEOUS | Status: DC | PRN

## 2012-03-15 MED ORDER — DARBEPOETIN ALFA-POLYSORBATE 150 MCG/0.3ML IJ SOLN
INTRAMUSCULAR | Status: AC
  Administered 2012-03-15: 150 ug via INTRAVENOUS
  Filled 2012-03-15: qty 0.3

## 2012-03-15 NOTE — Progress Notes (Signed)
Subjective:  No angina  Objective:  Vital Signs in the last 24 hours: Temp:  [98 F (36.7 C)-98.7 F (37.1 C)] 98.7 F (37.1 C) (12/03 0900) Pulse Rate:  [60-71] 71  (12/03 0946) Resp:  [16-26] 22  (12/03 0946) BP: (75-143)/(27-71) 95/47 mmHg (12/03 0946) SpO2:  [94 %-100 %] 95 % (12/03 0644) Weight:  [79.9 kg (176 lb 2.4 oz)-81.3 kg (179 lb 3.7 oz)] 79.9 kg (176 lb 2.4 oz) (12/03 0644)  Intake/Output from previous day:  Intake/Output Summary (Last 24 hours) at 03/15/12 0954 Last data filed at 03/15/12 1610  Gross per 24 hour  Intake   1270 ml  Output      0 ml  Net   1270 ml    Physical Exam: General appearance: alert, cooperative and no distress Lungs: clear to auscultation bilaterally Heart: regular rate and rhythm and 2/6 AS murmur   Rate: 72  Rhythm: normal sinus rhythm  Lab Results:  Basename 03/14/12 2019 03/14/12 0927  WBC 4.6 4.4  HGB 7.5* 7.2*  PLT 167 167    Basename 03/15/12 0448 03/14/12 0927  NA 135 139  K 5.0 4.7  CL 91* 93*  CO2 27 32  GLUCOSE 131* 99  BUN 75* 59*  CREATININE 10.24* 8.84*    Basename 03/15/12 0448 03/14/12 1834  TROPONINI 0.88* 1.45*   Hepatic Function Panel  Basename 03/13/12 0320  PROT --  ALBUMIN 3.0*  AST --  ALT --  ALKPHOS --  BILITOT --  BILIDIR --  IBILI --   No results found for this basename: CHOL in the last 72 hours  Basename 03/14/12 0927  INR 1.35    Imaging: Imaging results have been reviewed  Cardiac Studies:  Assessment/Plan:   Principal Problem:  *Acute blood loss anemia Active Problems:  NSTEMI (non-ST elevated myocardial infarction) - Type II MI due to Anemia with existing CAD  GI bleeding, secondary to Effient/Coumadin/ 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 Holding Effient till source of bleeding identified but should be restarted prior to discharge. Hold Coumadin for one month.   [name] 03/15/2012, 9:54 AM  I have seen and examined the patient along with [name].  I have reviewed the chart, notes and new data.  I agree with PA's note.  PLAN: Symptoms have resolved completely, suggesting they were due to post-hemorrhagic anemia. He meets criteria for both dual antiplatelet and anticoagulation therapy, but the risk of bleeding is prohibitive at least in the near term.  He has drug eluting stents that are less than 6 months old and should resume low dose ASA and Effient therapy, but will withhold warfarin at least for several weeks.  Thurmon Fair, MD, [facility] [phone] 03/15/2012, 4:03 PM

## 2012-03-15 NOTE — Progress Notes (Signed)
Subjective:  On hd , no cos, no further melena, feels stronger Objective Vital signs in last 24 hours: Filed Vitals:   03/15/12 0807 03/15/12 0822 03/15/12 0824 03/15/12 0830  BP: 80/39 81/35 87/39  76/42  Pulse: 63 70  64  Temp:  98.3 F (36.8 C) 98.4 F (36.9 C) 98.5 F (36.9 C)  TempSrc:  Oral Oral Oral  Resp: 20 17 20 16   Height:      Weight:      SpO2:       Weight change: 0.8 kg (1 lb 12.2 oz)  Intake/Output Summary (Last 24 hours) at 03/15/12 0843 Last data filed at 03/15/12 0824  Gross per 24 hour  Intake   1295 ml  Output      0 ml  Net   1295 ml   Labs: Basic Metabolic Panel:  Lab 03/15/12 1610 03/14/12 0927 03/13/12 0320  NA 135 139 140  K 5.0 4.7 3.5  CL 91* 93* 94*  CO2 27 32 34*  GLUCOSE 131* 99 94  BUN 75* 59* 30*  CREATININE 10.24* 8.84* 5.57*  CALCIUM 9.1 9.4 8.8  ALB -- -- --  PHOS -- -- 3.6   Liver Function Tests:  Lab 03/13/12 0320  AST --  ALT --  ALKPHOS --  BILITOT --  PROT --  ALBUMIN 3.0*   No results found for this basename: LIPASE:3,AMYLASE:3 in the last 168 hours No results found for this basename: AMMONIA:3 in the last 168 hours CBC:  Lab 03/14/12 2019 03/14/12 0927 03/13/12 1840 03/13/12 1218 03/13/12 0240 03/12/12 1231  WBC 4.6 4.4 4.2 -- -- --  NEUTROABS -- -- -- -- -- 3.1  HGB 7.5* 7.2* 7.7* -- -- --  HCT 22.3* 21.4* 23.0* -- -- --  MCV 108.3* 105.9* 106.5* 106.4* 123.8* --  PLT 167 167 168 -- -- --   Cardiac Enzymes:  Lab 03/15/12 0448 03/14/12 1834 03/14/12 0927 03/13/12 1848 03/13/12 1300  CKTOTAL -- -- -- -- --  CKMB -- -- -- -- --  CKMBINDEX -- -- -- -- --  TROPONINI 0.88* 1.45* 2.13* 2.14* 2.52*   CBG: No results found for this basename: GLUCAP:5 in the last 168 hours  Iron Studies: No results found for this basename: IRON,TIBC,TRANSFERRIN,FERRITIN in the last 72 hours Studies/Results: No results found. Medications:    . sodium chloride Stopped (03/15/12 0000)  . pantoprozole (PROTONIX) infusion 8  mg/hr (03/15/12 0622)  . [DISCONTINUED] sodium chloride 10 mL/hr at 03/14/12 1130      . allopurinol  50 mg Oral BID  . aspirin  81 mg Oral Daily  . atorvastatin  20 mg Oral q1800  . Chlorhexidine Gluconate Cloth  6 each Topical Q0600  . cinacalcet  30 mg Oral Q breakfast  . darbepoetin (ARANESP) injection - DIALYSIS  150 mcg Intravenous Q Tue-HD  . multivitamin  1 tablet Oral Daily  . mupirocin ointment  1 application Nasal BID  . paricalcitol  3 mcg Intravenous Q T,Th,Sa-HD  . PARoxetine  20 mg Oral Daily  . sevelamer  2,400 mg Oral TID WC  . sodium chloride  3 mL Intravenous Q12H  . [DISCONTINUED] aspirin  81 mg Oral Daily  . [DISCONTINUED] metoprolol tartrate  12.5 mg Oral BID  . [DISCONTINUED] metoprolol tartrate  12.5 mg Oral BID   I  have reviewed scheduled and prn medications.  Physical Exam: General:Alert, nad on hd Heart: RRR, 1/6 sem lsb, no rub Lungs: CTA bilaterally Abdomen: BS= =, soft ,nontender  Extremities: Dialysis Access: no pedal edema, L U A Avf patent on hd   HD orders - [place] [facility]TS, AVF, 3 hours and 15 min, 2 K, 2.25 calc bath, EDW 76 kg, zemplar 3 mcg per HD and epo 19,800 units per HD, heparin 2000 units per treatment.   Assessment/ Plan:  1. GIB- presumed based on heme+ stools; Hb 5.9 on admit, then up to 8.2,   down to 7.2. And 7.5 this am  With 2 units prbcs  transfusion on hd today.yesterday EGD  No active bleed and "Schatzki ring at GE junction  With Multiple biopsies used to disrupt ring" Coumadin and Effient on hold, getting ASA now.  For colonoscopy noted  To eval for ?possible Sm Bowel AVM's per Dr. Leone Payor 2. ESRD,  TTS Azucena Kuba.) schedule ,EDW 76 with am wt 79.9  ?? Bed scale accurate  CXR no vol or pedal edema 3. Anemia - AS  Above GIB/ ABL and Chronic anemia due to ESRD . 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 All of these meds  Continued/ fu am ca/  phos 5. HTN/volume- low BP on admission in 70's, now up to 94/50 and on hd/ home BP meds on hold (lopressor and vasotec), may need to reconsider the need for ACE and Beta blocker as OP 6. Fever- new, unknown etiology.? Blood transfusion , afebrile past 24hrs, no growth on BC so far / no urine made for c/s,no antibiotics yet.  CXR was clear on 12/1.  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 Hx CAD with stents= SE card. following   Lenny Pastel, PA-C [facility] Beeper [phone] 03/15/2012,8:43 AM  LOS: 3 days   Patient seen and examined and agree with assessment and plan as above.  Vinson Moselle  MD [facility] [phone] pgr    [phone] cell 03/15/2012, 3:04 PM

## 2012-03-15 NOTE — Progress Notes (Addendum)
Subjective: Pt reports dry cough x 2-3 weeks. Denies sob, chest pain, dizziness or lightheadedness. He had 1 dark stool overnight.  He tolerated regular diet last night.   HD RN: blood pressures running sbp 80s-90s.  He pulled fluid but called by nephrologist to give some fluids.  He received 1 unit of pRBC  Objective: Vital signs in last 24 hours: Filed Vitals:   03/15/12 1028 03/15/12 1047 03/15/12 1108 03/15/12 1200  BP: 88/43 91/46 86/46  92/49  Pulse: 71 71    Temp:  99 F (37.2 C)  98.8 F (37.1 C)  TempSrc:  Oral  Oral  Resp: 20 14 20    Height:      Weight:  175 lb 0.7 oz (79.4 kg)    SpO2:  91% 92%    Weight change: 1 lb 12.2 oz (0.8 kg)  Intake/Output Summary (Last 24 hours) at 03/15/12 1311 Last data filed at 03/15/12 1200  Gross per 24 hour  Intake   1445 ml  Output     60 ml  Net   1385 ml   General: lying in bed, in HD, alert and oriented, no distress HEENT: [place]/at CV:  +systolic murmur prob 2/2 left upper extremity fistula, normal rate  Lungs: ctab Abdomen: soft, ntnd, +normal bs Extremities: warm, no cyanosis or edema  Neuro: CN 2-12 grossly intact, moving all 4 extremities.   Lab Results: Basic Metabolic Panel:  Lab 03/15/12 1610 03/15/12 0448 03/14/12 0927 03/13/12 0320  NA 138 135 -- --  K 4.0 5.0 -- --  CL 97 91* -- --  CO2 30 27 -- --  GLUCOSE 89 131* -- --  BUN 21 75* -- --  CREATININE 4.17* 10.24* -- --  CALCIUM 8.9 9.1 -- --  MG -- -- 2.1 --  PHOS 2.6 -- -- 3.6   Liver Function Tests:  Lab 03/15/12 1147 03/13/12 0320  AST -- --  ALT -- --  ALKPHOS -- --  BILITOT -- --  PROT -- --  ALBUMIN 3.1* 3.0*   CBC:  Lab 03/15/12 1147 03/14/12 2019 03/12/12 1231  WBC 4.7 4.6 --  NEUTROABS 3.0 -- 3.1  HGB 8.2* 7.5* --  HCT 24.5* 22.3* --  MCV 104.7* 108.3* --  PLT 180 167 --   Cardiac Enzymes:  Lab 03/15/12 1147 03/15/12 0448 03/14/12 1834  CKTOTAL -- -- --  CKMB -- -- --  CKMBINDEX -- -- --  TROPONINI 0.73* 0.88* 1.45*    Coagulation:  Lab 03/14/12 0927 03/13/12 1247 03/13/12 0240 03/12/12 1214  LABPROT 16.4* 18.9* 24.7* 25.5*  INR 1.35 1.64* 2.35* 2.46*   Misc. Labs: none  Micro Results: Recent Results (from the past 240 hour(s))  MRSA PCR SCREENING     Status: Abnormal   Collection Time   03/12/12  5:54 PM      Component Value Range Status Comment   MRSA by PCR POSITIVE (*) NEGATIVE Final   CULTURE, BLOOD (ROUTINE X 2)     Status: Normal (Preliminary result)   Collection Time   03/12/12  9:50 PM      Component Value Range Status Comment   Specimen Description BLOOD RIGHT HAND   Final    Special Requests BOTTLES DRAWN AEROBIC ONLY 8CC   Final    Culture  Setup Time 03/13/2012 03:58   Final    Culture     Final    Value:        BLOOD CULTURE RECEIVED NO GROWTH TO DATE CULTURE WILL  BE HELD FOR 5 DAYS BEFORE ISSUING A FINAL NEGATIVE REPORT   Report Status PENDING   Incomplete   CULTURE, BLOOD (ROUTINE X 2)     Status: Normal (Preliminary result)   Collection Time   03/12/12  9:56 PM      Component Value Range Status Comment   Specimen Description BLOOD LEFT ARM   Final    Special Requests BOTTLES DRAWN AEROBIC ONLY 5CC   Final    Culture  Setup Time 03/13/2012 03:58   Final    Culture     Final    Value:        BLOOD CULTURE RECEIVED NO GROWTH TO DATE CULTURE WILL BE HELD FOR 5 DAYS BEFORE ISSUING A FINAL NEGATIVE REPORT   Report Status PENDING   Incomplete    Studies/Results: No results found. Medications:  Scheduled Meds:    . allopurinol  50 mg Oral BID  . aspirin  81 mg Oral Daily  . atorvastatin  20 mg Oral q1800  . bisacodyl  5 mg Oral TID  . Chlorhexidine Gluconate Cloth  6 each Topical Q0600  . cinacalcet  30 mg Oral Q breakfast  . darbepoetin (ARANESP) injection - DIALYSIS  150 mcg Intravenous Q Tue-HD  . metoCLOPramide (REGLAN) injection  10 mg Intravenous Once  . metoCLOPramide (REGLAN) injection  10 mg Intravenous Once  . multivitamin  1 tablet Oral Daily  . mupirocin  ointment  1 application Nasal BID  . pantoprazole  40 mg Oral Q0600  . paricalcitol  3 mcg Intravenous Q T,Th,Sa-HD  . PARoxetine  20 mg Oral Daily  . polyethylene glycol-electrolytes  2,000 mL Oral Once  . sevelamer  2,400 mg Oral TID WC  . sodium chloride  3 mL Intravenous Q12H  . [DISCONTINUED] aspirin  81 mg Oral Daily  . [DISCONTINUED] metoprolol tartrate  12.5 mg Oral BID   Continuous Infusions:    . sodium chloride Stopped (03/15/12 0000)  . [DISCONTINUED] sodium chloride 10 mL/hr at 03/14/12 1130  . [DISCONTINUED] pantoprozole (PROTONIX) infusion 8 mg/hr (03/15/12 1100)   PRN Meds:.acetaminophen, LORazepam, sodium chloride, [DISCONTINUED] sodium chloride, [DISCONTINUED] sodium chloride, [DISCONTINUED] alteplase, [DISCONTINUED] butamben-tetracaine-benzocaine, [DISCONTINUED] feeding supplement (NEPRO CARB STEADY), [DISCONTINUED] fentaNYL, [DISCONTINUED] heparin, [DISCONTINUED] lidocaine, [DISCONTINUED] lidocaine-prilocaine, [DISCONTINUED] midazolam, [DISCONTINUED] pentafluoroprop-tetrafluoroeth Assessment/Plan: 71 y.o male PMH CAD s/p multiple stenting, AF previously on Coumadin, grade 2 diastolic dysfunction, gout, 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    1.  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  FOBT + this admission.  H -GI following. Upper endoscopy 03/14/12 with Dr. Leone Payor showed schatzki ring at GE junction. Biopsies were taken, 5 cm hiatal hernia.  -Hemoglobin now 8.2 s/p transfusion 2 units with a third pRBCs running currently.  Careful with transfusions due to warm antibodies.  -goal hbg 10 with cardiac history but will not transfuse unless below 7, monitoring cbc q12 hours  -will resume Effient by discharge and hold Coumadin x 1 month.  -iv  Protonix changed to oral -NPO for colonoscopy 12/4   2. NSTEMI (Type II MI due to anemia with existing CAD) -0.54 trended up to 2.52, trending down to 0.77. Patient is not symptomatic  -Cardiology (Dr. Herbie Baltimore) consulted and aware -will continue cycle enzymes  -pending echo  3. Hypotension with history of Hypertension -BP improving  80s-100s/40s-60s -Etiology most likely secondary to GIB loss.  -will continue to monitor. Holding enalapril 10 mg qd, Amiodarone 200 mg qd, Imdur EF 30 mg, BB -with cardiac history cards recommends adding BB, Imdur, ACEI when BP improves -low dose metoprolol d/c'ed by nephrology.  -will continue to monitor   4. History of CAD and grade 2 diastolic dysfunction -will resume Effient prior to discharge. Cont. Asprin 81 mg qd   -He will f/u outpatient with cardiology for management  -cards following for recs -Cards said consider myoview outpatient   5. MRSA + nares -protocol and contact precautions  6. 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 Advised risk of stroke off Coumadin but currently risk of bleeding outweighs benefits  -also holding Amiodarone currently.  Cards rec resume Amiodarone. Will resume at some point.  -continue to monitor via tele  8. F/E/N -will monitor daily labs -clear liq diet. NPO after midnight for colonoscopy 12/4  9. DVT Px -none due to bleeding    Dispo: Disposition is deferred at this time, awaiting improvement of current medical problems.  Anticipated discharge in approximately 1-2 day(s).   The patient does have a current PCP (Butch Penny G, MD), therefore will be requiring OPC follow-up after discharge with him.   The patient does not have transportation limitations that hinder transportation to clinic appointments.  .Services Needed at time of discharge: Y = Yes, Blank =  No PT:   OT:   RN:   Equipment:   Other:     LOS: 3 days   Roger [name] [phone] 03/15/2012, 1:11 PM

## 2012-03-15 NOTE — Progress Notes (Signed)
Pt called nurse to room stating that he feels sob and is very cold.  Temp taken and was 101.3.  Spo2 91% on room air.  O2 was applied at 2 liters/min.  BP unchanged at 91/45.  Paged MD to inform

## 2012-03-15 NOTE — Progress Notes (Signed)
     [facility] Daily Rounding Note 03/15/2012, 12:01 PM  SUBJECTIVE:       No complaints.   OBJECTIVE:         Vital signs in last 24 hours:    Temp:  [98 F (36.7 C)-99 F (37.2 C)] 99 F (37.2 C) (12/03 1047) Pulse Rate:  [61-71] 71  (12/03 1047) Resp:  [14-26] 20  (12/03 1108) BP: (75-143)/(27-71) 86/46 mmHg (12/03 1108) SpO2:  [91 %-100 %] 92 % (12/03 1108) Weight:  [79.4 kg (175 lb 0.7 oz)-81.3 kg (179 lb 3.7 oz)] 79.4 kg (175 lb 0.7 oz) (12/03 1047) Last BM Date: 03/14/12 General:  NAD, looks well.  Heart: some sinus beats, some flutter waves on monitor. Chest:  Breathing unlabored.  Neuro/Psych:  Relaxed, engaged, good spirits Did not physically touch pt  Intake/Output from previous day: 12/02 0701 - 12/03 0700 In: 995 [P.O.:360; I.V.:635] Out: -   Intake/Output this shift: Total I/O In: 595 [P.O.:120; I.V.:125; Blood:350] Out: 60 [Other:60]  Lab Results:  Basename 03/14/12 2019 03/14/12 0927 03/13/12 1840  WBC 4.6 4.4 4.2  HGB 7.5* 7.2* 7.7*  HCT 22.3* 21.4* 23.0*  PLT 167 167 168   BMET  Basename 03/15/12 0448 03/14/12 0927 03/13/12 0320  NA 135 139 140  K 5.0 4.7 3.5  CL 91* 93* 94*  CO2 27 32 34*  GLUCOSE 131* 99 94  BUN 75* 59* 30*  CREATININE 10.24* 8.84* 5.57*  CALCIUM 9.1 9.4 8.8   PT/INR  Basename 03/14/12 0927 03/13/12 1247  LABPROT 16.4* 18.9*  INR 1.35 1.64*    ASSESMENT: * Gi bleed: Melenic stool and progressive anemia. Pt on coumadin and Effient, both on hold.  EGD 03/14/12:      1. 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 5 cm hiatal hernia     3. 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 [place] 03/2010: Erosion vs sb avm. Getting Heparin with  Dialysis.   * Dysphagia. Intervention to Schatzki's ring noted above.  * S/p 2 PRBCs for macrocytic, acute blood loss on top of chronic  anemia. Hgb nadir of 5.9.  On Aranesp.  * A fib. Coumadin on hold and reversed.  * CAD. PCI 09/2011. chronic Coumadin and effient, on hold.  * ESRD.  Dialysis this AM.     PLAN: *  Colonoscopy 1 PM tomorrow. Pt agreeable and looks well enough to proceed. Split dose Golytely prep, Reglan and Dulcolax. *  Stop IV protonix.  Resume po once daily.     LOS: 3 days   Jennye Moccasin  03/15/2012, 12:01 PM Pager: [phone]   [facility] Attending  I have also seen and assessed the patient and agree with the above note.  Iva Boop, MD, [facility] [phone] (pager) 03/15/2012 7:35 PM

## 2012-03-15 NOTE — Progress Notes (Signed)
Echocardiogram 2D Echocardiogram has been performed.  Roger [name] 03/15/2012, 2:52 PM

## 2012-03-15 NOTE — Progress Notes (Signed)
Pt seen and agree with Ms. Roger [name]. Seen 12/2

## 2012-03-15 NOTE — Progress Notes (Signed)
Internal Medicine Teaching Service Attending Note Date: 03/15/2012  Patient name: Roger [name]  Medical record number: [id number]  Date of birth: [birth year]    This patient has been seen and discussed with the house staff. Please see their note for complete details. I concur with their findings with the following additions/corrections: Patient has no complaints and feels great at this time. Patient completed his hemodialysis and this morning. The patient tells me that the nurse was not able to take a lot of fluid out during hemodialysis because his blood pressure was on the lower side. BP 91/45  Pulse 71  Temp 101.3 F (38.5 C) (Oral)  Resp 22  Ht 5\' 8"  (1.727 m)  Wt 175 lb 0.7 oz (79.4 kg)  BMI 26.62 kg/m2  SpO2 94% The patient had his upper endoscopy completed yesterday and the results were reviewed. We have started the patient back on aspirin but prasugrel and Coumadin continues to be on hold. We will follow along with gastroenterology and cardiology recommendations regarding management of patient's GI bleed and anticoagulation. Patient's hemoglobin is 8.2 at this time. We are monitoring this every 12 hours and plan to transfuse if this starts to trend down. Patient currently does not have active bleeding and currently free of chest pain which is the reason that we will have a threshold of 8 before we transfuse him.    Lars Mage 03/15/2012, 3:28 PM

## 2012-03-16 ENCOUNTER — Encounter (HOSPITAL_COMMUNITY): Payer: Self-pay | Admitting: *Deleted

## 2012-03-16 ENCOUNTER — Encounter (HOSPITAL_COMMUNITY)
Admission: EM | Disposition: A | Discharge status: Home / Self Care | Payer: Self-pay | Source: Home / Self Care | Attending: Internal Medicine

## 2012-03-16 DIAGNOSIS — I255 Ischemic cardiomyopathy: Secondary | ICD-10-CM | POA: Diagnosis present

## 2012-03-16 HISTORY — PX: COLONOSCOPY: SHX5424

## 2012-03-16 LAB — TYPE AND SCREEN
Antibody Screen: POSITIVE
DAT, IgG: POSITIVE
Donor AG Type: NEGATIVE
Unit division: 0
Unit division: 0

## 2012-03-16 LAB — CBC WITH DIFFERENTIAL/PLATELET
Basophils Absolute: 0 10*3/uL (ref 0.0–0.1)
Basophils Absolute: 0.1 10*3/uL (ref 0.0–0.1)
Basophils Relative: 1 % (ref 0–1)
Eosinophils Absolute: 0.3 10*3/uL (ref 0.0–0.7)
Eosinophils Absolute: 0.3 10*3/uL (ref 0.0–0.7)
HCT: 26 % — ABNORMAL LOW (ref 39.0–52.0)
Hemoglobin: 8.6 g/dL — ABNORMAL LOW (ref 13.0–17.0)
Lymphs Abs: 1.5 10*3/uL (ref 0.7–4.0)
MCH: 35.5 pg — ABNORMAL HIGH (ref 26.0–34.0)
MCH: 34.4 pg — ABNORMAL HIGH (ref 26.0–34.0)
MCHC: 33.1 g/dL (ref 30.0–36.0)
MCHC: 32.2 g/dL (ref 30.0–36.0)
MCV: 106.9 fL — ABNORMAL HIGH (ref 78.0–100.0)
Monocytes Absolute: 0.5 10*3/uL (ref 0.1–1.0)
Monocytes Absolute: 0.6 10*3/uL (ref 0.1–1.0)
Neutro Abs: 2.3 10*3/uL (ref 1.7–7.7)
Platelets: 206 10*3/uL (ref 150–400)
RDW: 26.9 % — ABNORMAL HIGH (ref 11.5–15.5)
RDW: 25.9 % — ABNORMAL HIGH (ref 11.5–15.5)

## 2012-03-16 LAB — BASIC METABOLIC PANEL
BUN: 29 mg/dL — ABNORMAL HIGH (ref 6–23)
Chloride: 94 mEq/L — ABNORMAL LOW (ref 96–112)
Creatinine, Ser: 6.26 mg/dL — ABNORMAL HIGH (ref 0.50–1.35)
GFR calc non Af Amer: 8 mL/min — ABNORMAL LOW (ref >90)
Glucose, Bld: 109 mg/dL — ABNORMAL HIGH (ref 70–99)
Potassium: 3.8 mEq/L (ref 3.5–5.1)

## 2012-03-16 SURGERY — COLONOSCOPY
Anesthesia: Moderate Sedation

## 2012-03-16 MED ORDER — MIDAZOLAM HCL 5 MG/5ML IJ SOLN
INTRAMUSCULAR | Status: DC | PRN
  Administered 2012-03-16 (×2): 2 mg via INTRAVENOUS

## 2012-03-16 MED ORDER — FENTANYL CITRATE 0.05 MG/ML IJ SOLN
INTRAMUSCULAR | Status: AC
  Filled 2012-03-16: qty 2

## 2012-03-16 MED ORDER — MIDAZOLAM HCL 5 MG/ML IJ SOLN
INTRAMUSCULAR | Status: AC
  Filled 2012-03-16: qty 2

## 2012-03-16 MED ORDER — FENTANYL CITRATE 0.05 MG/ML IJ SOLN
INTRAMUSCULAR | Status: DC | PRN
  Administered 2012-03-16 (×3): 25 ug via INTRAVENOUS

## 2012-03-16 NOTE — Progress Notes (Addendum)
Subjective: Patient has been stooling due to bowel prep yellow stool.  He feels weak and cant sit in the recliner.  He has decreased energy.  He was having this prior to admission and he normally feels weak on the day of HD and recovers the next day.  He has been getting HD x 4-5 years.  He had a nosebleed overnight.  Denies chest pian, shortness of breath, dizziness or lightheadedness.   Objective: Vital signs in last 24 hours: Filed Vitals:   03/15/12 2348 03/16/12 0500 03/16/12 0530 03/16/12 0748  BP:    88/44  Pulse:    69  Temp: 99.4 F (37.4 C)  99.9 F (37.7 C) 98.9 F (37.2 C)  TempSrc: Oral  Oral Oral  Resp:      Height:      Weight:  177 lb 14.6 oz (80.7 kg)    SpO2: 92%  94% 83%   Weight change: -4 lb 3 oz (-1.9 kg)  Intake/Output Summary (Last 24 hours) at 03/16/12 0755 Last data filed at 03/15/12 2213  Gross per 24 hour  Intake   2033 ml  Output     61 ml  Net   1972 ml   General: lying in bed, nad, alert and oriented x 3 HEENT: [place]/at CV:  +systolic murmur prob 2/2 left upper extremity fistula, normal rate  Lungs: ctab Abdomen: soft, ntnd, +normal bs Extremities: warm, no cyanosis or edema  Neuro: CN 2-12 grossly intact, moving all 4 extremities.   Lab Results: Basic Metabolic Panel:  Lab 03/16/12 1324 03/15/12 1147 03/14/12 0927 03/13/12 0320  NA 135 138 -- --  K 3.8 4.0 -- --  CL 94* 97 -- --  CO2 28 30 -- --  GLUCOSE 109* 89 -- --  BUN 29* 21 -- --  CREATININE 6.26* 4.17* -- --  CALCIUM 9.7 8.9 -- --  MG -- -- 2.1 --  PHOS -- 2.6 -- 3.6    CBC:  Lab 03/16/12 0545 03/15/12 1843  WBC 4.3 4.2  NEUTROABS 2.3 2.5  HGB 8.6* 7.4*  HCT 26.0* 22.4*  MCV 107.4* 105.2*  PLT 177 150    Misc. Labs: none  Micro Results: Recent Results (from the past 240 hour(s))  MRSA PCR SCREENING     Status: Abnormal   Collection Time   03/12/12  5:54 PM      Component Value Range Status Comment   MRSA by PCR POSITIVE (*) NEGATIVE Final   CULTURE, BLOOD  (ROUTINE X 2)     Status: Normal (Preliminary result)   Collection Time   03/12/12  9:50 PM      Component Value Range Status Comment   Specimen Description BLOOD RIGHT HAND   Final    Special Requests BOTTLES DRAWN AEROBIC ONLY 8CC   Final    Culture  Setup Time 03/13/2012 03:58   Final    Culture     Final    Value:        BLOOD CULTURE RECEIVED NO GROWTH TO DATE CULTURE WILL BE HELD FOR 5 DAYS BEFORE ISSUING A FINAL NEGATIVE REPORT   Report Status PENDING   Incomplete   CULTURE, BLOOD (ROUTINE X 2)     Status: Normal (Preliminary result)   Collection Time   03/12/12  9:56 PM      Component Value Range Status Comment   Specimen Description BLOOD LEFT ARM   Final    Special Requests BOTTLES DRAWN AEROBIC ONLY 5CC   Final  Culture  Setup Time 03/13/2012 03:58   Final    Culture     Final    Value:        BLOOD CULTURE RECEIVED NO GROWTH TO DATE CULTURE WILL BE HELD FOR 5 DAYS BEFORE ISSUING A FINAL NEGATIVE REPORT   Report Status PENDING   Incomplete    Studies/Results: No results found. Medications:  Scheduled Meds:    . allopurinol  50 mg Oral BID  . aspirin  81 mg Oral Daily  . atorvastatin  20 mg Oral q1800  . [COMPLETED] bisacodyl  5 mg Oral TID  . Chlorhexidine Gluconate Cloth  6 each Topical Q0600  . cinacalcet  30 mg Oral Q breakfast  . darbepoetin (ARANESP) injection - DIALYSIS  150 mcg Intravenous Q Tue-HD  . [COMPLETED] metoCLOPramide (REGLAN) injection  10 mg Intravenous Once  . [COMPLETED] metoCLOPramide (REGLAN) injection  10 mg Intravenous Once  . multivitamin  1 tablet Oral Daily  . mupirocin ointment  1 application Nasal BID  . pantoprazole  40 mg Oral Q0600  . paricalcitol  3 mcg Intravenous Q T,Th,Sa-HD  . PARoxetine  20 mg Oral Daily  . [COMPLETED] polyethylene glycol-electrolytes  2,000 mL Oral Once  . sevelamer  2,400 mg Oral TID WC  . sodium chloride  3 mL Intravenous Q12H   Continuous Infusions:    . sodium chloride Stopped (03/15/12 0000)  .  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    1.  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  FOBT + this admission.  -GI following. Upper endoscopy 03/14/12 with Dr. Leone Payor showed schatzki ring at GE junction. Biopsies were taken, 5 cm hiatal hernia.  -Hemoglobin 03/16/12 at 0545 am 8.6 s/p transfusion 3 units this admission careful with transfusions due to warm antibodies.  -goal hbg 10 with cardiac history but will not transfuse unless below 7, monitoring cbc q12 hours  -will resume Effient by discharge and hold Coumadin x 1 month.  -oral Protonix  -NPO for colonoscopy 12/4   2. NSTEMI (Type II MI due to anemia with existing CAD) -Trop 0.54 trended up to 2.52, trending down to 0.73. Patient is not symptomatic  -Cardiology (Dr. Herbie Baltimore) consulted and aware -echo with slight systolic dysfunction mild EF 45-50%, mild LVH, severe hypokinesis inferiorlaterally and mild hypokinesis anteriorlaterally. Grade 2 diastolic dysfunction. Previous echo 08/2011 with EF 50-55% and moderate inferior hypokinesis and grade 2 diastolic dysfunction -Cards recommends myoview outpatient.    3. Reduced systolic LV function function and grade 2 diastolic  dysfuntion -03/15/12 echo with slight systolic dysfunction mild EF 45-50%, mild LVH, severe hypokinesis inferiorlaterally and mild hypokinesis anteriorlaterally. Grade 2 diastolic dysfunction. Previous echo 08/2011 with EF 50-55% and moderate inferior hypokinesis and grade 2 diastolic dysfunction -He will need to be on a BB per cards consider starting tomorrow.  Also discussed continuing ASA, resuming ACEI at some point, consider nitrates and statin.  -He will f/u outpatient with cardiology and myoview in  future   4. Hypotension with history of Hypertension -BP improving 80s-90s/30s-40s -Etiology most likely secondary to GIB loss.  -will continue to monitor. Holding enalapril 10 mg qd, Amiodarone 200 mg qd, Imdur EF 30 mg, BB -with cardiac history cards recommends adding BB, Imdur, ACEI when BP improves -will continue to monitor   5. Fever -resolved  -T max 101.3 after transfusion yesterday  -likely 2/2 transfusion -blood cultures NTD, will continue to monitor  6. History of CAD and grade 2 diastolic dysfunction -will resume Effient prior to discharge. Cont. Asprin 81 mg qd   -He will f/u outpatient with cardiology for management  -cards following for recs -Cards said consider myoview outpatient   7. MRSA + nares -protocol and contact precautions  8. 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  10. F/E/N -will monitor daily labs -clear liq diet. NPO after midnight for colonoscopy 12/4  11. DVT Px -none due to bleeding    Dispo: Disposition is deferred at this time, awaiting improvement of current medical problems.  Anticipated discharge in approximately 1-2 day(s).   The patient does have a current PCP (Butch Penny G, MD), therefore will be requiring OPC follow-up after discharge with him.   The patient does not have transportation  limitations that hinder transportation to clinic appointments.  .Services Needed at time of discharge: Y = Yes, Blank = No PT:   OT:   RN:   Equipment:   Other:     LOS: 4 days   Roger [name] [phone] 03/16/2012, 7:55 AM

## 2012-03-16 NOTE — Progress Notes (Signed)
Subjective:  Denies CP. Reports mild SOB earlier this AM. Currently on 2L O2 via .   Objective:  Vital Signs in the last 24 hours: Temp:  [98.7 F (37.1 C)-101.3 F (38.5 C)] 98.9 F (37.2 C) (12/04 0748) Pulse Rate:  [67-78] 69  (12/04 0748) Resp:  [14-22] 22  (12/03 1400) BP: (85-145)/(35-78) 88/44 mmHg (12/04 0748) SpO2:  [83 %-100 %] 83 % (12/04 0748) Weight:  [79.4 kg (175 lb 0.7 oz)-80.7 kg (177 lb 14.6 oz)] 80.7 kg (177 lb 14.6 oz) (12/04 0500)  Intake/Output from previous day:  Intake/Output Summary (Last 24 hours) at 03/16/12 0846 Last data filed at 03/15/12 2213  Gross per 24 hour  Intake   1683 ml  Output     61 ml  Net   1622 ml    Physical Exam: General appearance: alert, cooperative and no distress Neck: radiation of systolic murmur to the carotids bilaterally Lungs: clear to auscultation bilaterally Heart: regular rate and rhythm and 3/6 systolic murmur best heard at LUSB Abdomen: soft, non-tender; bowel sounds normal; no masses,  no organomegaly Extremities: No LEE Pulses: 2+ and symmetric Skin: warm and dry Neurologic: Grossly normal   Rate: 69  Rhythm: normal sinus rhythm  Lab Results:  Basename 03/16/12 0545 03/15/12 1843  WBC 4.3 4.2  HGB 8.6* 7.4*  PLT 177 150    Basename 03/16/12 0545 03/15/12 1147  NA 135 138  K 3.8 4.0  CL 94* 97  CO2 28 30  GLUCOSE 109* 89  BUN 29* 21  CREATININE 6.26* 4.17*    Basename 03/15/12 1147 03/15/12 0448  TROPONINI 0.73* 0.88*   Hepatic Function Panel  Basename 03/15/12 1147  PROT --  ALBUMIN 3.1*  AST --  ALT --  ALKPHOS --  BILITOT --  BILIDIR --  IBILI --   No results found for this basename: CHOL in the last 72 hours  Basename 03/14/12 0927  INR 1.35    Imaging: Imaging results have been reviewed  Cardiac Studies:  Assessment/Plan:   Principal Problem:  *Acute blood loss anemia Active Problems:  NSTEMI (non-ST elevated myocardial infarction) - Type II MI due to Anemia with  existing CAD  GI bleeding, secondary to Effient/Coumadin/ suspected SB AVMs  CAD, MI CFX BMS 12/11 with ISR DES (as well as OM) May 2013  Anticoagulated on warfarin  Hypotension  Aortic stenosis, moderate, December 2013  HTN (hypertension)  PAD (peripheral artery disease): 60% bilateral common iliac stenosis   HLD (hyperlipidemia)  Ischemic cardiomyopathy, EF 45-50% echo 12/13  Plan: Scheduled for colonoscopy today. Continue to hold Effient until source of bleeding identified. Continue to hold Coumadin for several weeks. Pt is on 81 mg ASA. Hgb and Hct trending upward.  BP and HR stable. Consider restarting beta blocker tomorrow. Echo yesterday revealed ~ 10 % decrease in EF from last echo 6 months ago. Will need to follow as OP.     [name] 03/16/2012, 8:46 AM

## 2012-03-16 NOTE — Progress Notes (Signed)
Pt. Seen and examined. Agree with the NP/PA-C note as written.  He has not had chest pain, mild dyspnea has resolved. Plan for colonoscopy today. No further bleeding noted. HGB has been stably low, but improved after transfusion. Agree with continuing ASA for the time being. He is almost 6 mos out from DES. EF is mildly decreased with inferior akinesis, which seems out of proportion to the troponin rise, unless that had occurred in the last several months. Continue with medical therapy for now, obviously no plans for [facility].  Hold on b-blocker until tomorrow pending c-scope results.  Chrystie Nose, MD, [facility] Attending Cardiologist [facility]

## 2012-03-16 NOTE — Progress Notes (Signed)
Subjective:  Says stools aren't as dark as before. Hb up to 8.6 this am at 5:45  Objective Vital signs in last 24 hours: Filed Vitals:   03/15/12 2325 03/15/12 2348 03/16/12 0500 03/16/12 0530  BP: 95/49     Pulse:      Temp:  99.4 F (37.4 C)  99.9 F (37.7 C)  TempSrc:  Oral  Oral  Resp:      Height:      Weight:   80.7 kg (177 lb 14.6 oz)   SpO2:  92%  94%   Weight change: -1.9 kg (-4 lb 3 oz)  Intake/Output Summary (Last 24 hours) at 03/16/12 0748 Last data filed at 03/15/12 2213  Gross per 24 hour  Intake   2033 ml  Output     61 ml  Net   1972 ml   Labs: Basic Metabolic Panel:  Lab 03/16/12 1610 03/15/12 1147 03/15/12 0448 03/13/12 0320  NA 135 138 135 --  K 3.8 4.0 5.0 --  CL 94* 97 91* --  CO2 28 30 27  --  GLUCOSE 109* 89 131* --  BUN 29* 21 75* --  CREATININE 6.26* 4.17* 10.24* --  CALCIUM 9.7 8.9 9.1 --  ALB -- -- -- --  PHOS -- 2.6 -- 3.6   Liver Function Tests:  Lab 03/15/12 1147 03/13/12 0320  AST -- --  ALT -- --  ALKPHOS -- --  BILITOT -- --  PROT -- --  ALBUMIN 3.1* 3.0*   No results found for this basename: LIPASE:3,AMYLASE:3 in the last 168 hours No results found for this basename: AMMONIA:3 in the last 168 hours CBC:  Lab 03/16/12 0545 03/15/12 1843 03/15/12 1147 03/14/12 2019 03/14/12 0927  WBC 4.3 4.2 4.7 -- --  NEUTROABS 2.3 2.5 3.0 -- --  HGB 8.6* 7.4* 8.2* -- --  HCT 26.0* 22.4* 24.5* -- --  MCV 107.4* 105.2* 104.7* 108.3* 105.9*  PLT 177 150 180 -- --   Cardiac Enzymes:  Lab 03/15/12 1147 03/15/12 0448 03/14/12 1834 03/14/12 0927 03/13/12 1848  CKTOTAL -- -- -- -- --  CKMB -- -- -- -- --  CKMBINDEX -- -- -- -- --  TROPONINI 0.73* 0.88* 1.45* 2.13* 2.14*   CBG: No results found for this basename: GLUCAP:5 in the last 168 hours  Iron Studies: No results found for this basename: IRON,TIBC,TRANSFERRIN,FERRITIN in the last 72 hours Studies/Results: No results found. Medications:    . sodium chloride Stopped (03/15/12  0000)  . [DISCONTINUED] pantoprozole (PROTONIX) infusion 8 mg/hr (03/15/12 1100)      . allopurinol  50 mg Oral BID  . aspirin  81 mg Oral Daily  . atorvastatin  20 mg Oral q1800  . [COMPLETED] bisacodyl  5 mg Oral TID  . Chlorhexidine Gluconate Cloth  6 each Topical Q0600  . cinacalcet  30 mg Oral Q breakfast  . darbepoetin (ARANESP) injection - DIALYSIS  150 mcg Intravenous Q Tue-HD  . [COMPLETED] metoCLOPramide (REGLAN) injection  10 mg Intravenous Once  . [COMPLETED] metoCLOPramide (REGLAN) injection  10 mg Intravenous Once  . multivitamin  1 tablet Oral Daily  . mupirocin ointment  1 application Nasal BID  . pantoprazole  40 mg Oral Q0600  . paricalcitol  3 mcg Intravenous Q T,Th,Sa-HD  . PARoxetine  20 mg Oral Daily  . [COMPLETED] polyethylene glycol-electrolytes  2,000 mL Oral Once  . sevelamer  2,400 mg Oral TID WC  . sodium chloride  3 mL Intravenous Q12H  I  have reviewed scheduled and prn medications.  Physical Exam: General:Alert, nad on hd Heart: RRR, 1/6 sem lsb, no rub Lungs: CTA bilaterally Abdomen: BS= =, soft ,nontender Extremities: Dialysis Access: no pedal edema, L U A Avf patent on hd   HD orders - [place] TTS, AVF, 3 hours and 15 min, 2 K, 2.25 calc bath, EDW 76 kg, zemplar 3 mcg per HD and epo 19,800 units per HD, heparin 2000 units per treatment.   Assessment/ Plan:  1. GIB/ heme+ stools w anemia-- Hb 5.9 at lowest, 8.6 this am- s/p total 3u prbc's. Coumadin and Effient on hold, INR down, getting ASA only now.  For colonoscopy today. EGD showed Schatzki ring only.  2. ESRD, cont tts HD 3. HTN/volume- low BP persists in 80's today. Home BP meds on hold (lopressor and vasotec). 4 kg over dry weight but exam more c/w low volume. Will check standing weight today if possible. Keep even with HD.  4. 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 All of these meds   Continued/ fu am ca/ phos 6. Fevers-- had fever on admission which resolved, then had temp spike after prbc transfusion yesterday. Initial blood cx's and CXR all negative. Not getting abx. 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 Hx CAD with stents= SE card. following  Vinson Moselle  MD [facility] [phone] pgr    [phone] cell 03/16/2012, 7:53 AM

## 2012-03-16 NOTE — Op Note (Signed)
[facility] [address] [place], [address]   COLONOSCOPY PROCEDURE REPORT  PATIENT: Cem, Kosman  MR#: [id number] BIRTHDATE: 21-Jul-1940 , 71  yrs. old GENDER: Male ENDOSCOPIST: Iva Boop, MD, [facility] PROCEDURE DATE:  03/16/2012 PROCEDURE:   Colonoscopy, diagnostic ASA CLASS:   Class III INDICATIONS:melena.   on ASA, Effient and warfarin - Afib and drug-eluting stents MEDICATIONS: Fentanyl-Detailed 75 mg IV and Versed 4 mg IV  DESCRIPTION OF PROCEDURE:   After the risks benefits and alternatives of the procedure were thoroughly explained, informed consent was obtained.  A digital rectal exam revealed no abnormalities of the rectum and A digital rectal exam revealed the prostate was not enlarged.   The     endoscope was introduced through the anus and advanced to the terminal ileum which was intubated for a short distance. No adverse events experienced. The quality of the prep was adequate, using Colyte  The instrument was then slowly withdrawn as the colon was fully examined.      COLON FINDINGS: Moderate sized internal hemorrhoids were found. The colonic mucosa appeared normal throughout the entire examined colon.  Retroflexed views revealed internal hemorrhoids. The time to cecum=3 minutes 0 seconds.  Withdrawal time=12 minutes 0 seconds.  The scope was withdrawn and the procedure completed. COMPLICATIONS: There were no complications.  ENDOSCOPIC IMPRESSION: 1.   Moderate sized internal hemorrhoids 2.   The colonic mucosa appeared normal throughout the entire examined colon - adequate prep - no surce of GI bleeding  RECOMMENDATIONS: Discussed with Dr.  Rennis Golden - he will see what anti-PLT and anti-coag regimen is best in this scenario, anticipate modifying it, ? 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

## 2012-03-16 NOTE — Progress Notes (Signed)
Internal Medicine Teaching Service Attending Note Date: 03/16/2012  Patient name: Roger [name]  Medical record number: [id number]  Date of birth: 11-14-1940    This patient has been seen and discussed with the house staff. Please see their note for complete details. I concur with their findings with the following additions/corrections: Patient was sitting in a chair by the side of the bed.He is comfortable, denies any chest pain or shortness of breath. Patient had some minor bleeding from his nose last night.Plan for colonoscopy today. Hemoglobin is stable at this time. Patient's echo results shows mildly decreased ejection fraction with inferior akinesis. Patient is currently on the side of low blood pressure and we will hold on beta blocker or ACE inhibitor at this time.  Lars Mage 03/16/2012, 10:46 AM

## 2012-03-17 ENCOUNTER — Encounter (HOSPITAL_COMMUNITY): Payer: Self-pay | Admitting: Internal Medicine

## 2012-03-17 ENCOUNTER — Telehealth: Payer: Self-pay | Admitting: Internal Medicine

## 2012-03-17 LAB — CBC
HCT: 22.2 % — ABNORMAL LOW (ref 39.0–52.0)
MCH: 34.6 pg — ABNORMAL HIGH (ref 26.0–34.0)
MCH: 33.8 pg (ref 26.0–34.0)
MCHC: 32.9 g/dL (ref 30.0–36.0)
Platelets: 173 10*3/uL (ref 150–400)
Platelets: 178 10*3/uL (ref 150–400)
RBC: 2.63 MIL/uL — ABNORMAL LOW (ref 4.22–5.81)
RDW: 25.1 % — ABNORMAL HIGH (ref 11.5–15.5)
RDW: 24.9 % — ABNORMAL HIGH (ref 11.5–15.5)
RDW: 24.9 % — ABNORMAL HIGH (ref 11.5–15.5)
WBC: 4.5 10*3/uL (ref 4.0–10.5)

## 2012-03-17 LAB — BASIC METABOLIC PANEL
Calcium: 8.6 mg/dL (ref 8.4–10.5)
Creatinine, Ser: 8.4 mg/dL — ABNORMAL HIGH (ref 0.50–1.35)
GFR calc Af Amer: 7 mL/min — ABNORMAL LOW (ref >90)
GFR calc non Af Amer: 6 mL/min — ABNORMAL LOW (ref >90)

## 2012-03-17 MED ORDER — DIPHENHYDRAMINE HCL 25 MG PO CAPS
25.0000 mg | ORAL_CAPSULE | Freq: Once | ORAL | Status: AC
  Administered 2012-03-17: 25 mg via ORAL
  Filled 2012-03-17: qty 1

## 2012-03-17 MED ORDER — DARBEPOETIN ALFA-POLYSORBATE 200 MCG/0.4ML IJ SOLN: 200.0000 ug | INTRAMUSCULAR | Status: DC

## 2012-03-17 MED ORDER — ASPIRIN 81 MG PO CHEW: 162.0000 mg | CHEWABLE_TABLET | Freq: Every day | ORAL | Status: DC

## 2012-03-17 MED ORDER — PARICALCITOL 5 MCG/ML IV SOLN
INTRAVENOUS | Status: AC
  Administered 2012-03-17: 3 ug via INTRAVENOUS
  Filled 2012-03-17: qty 1

## 2012-03-17 MED ORDER — ACETAMINOPHEN 325 MG PO TABS
650.0000 mg | ORAL_TABLET | Freq: Once | ORAL | Status: AC
  Administered 2012-03-17: 650 mg via ORAL
  Filled 2012-03-17: qty 2

## 2012-03-17 MED ORDER — MUPIROCIN 2 % EX OINT: 1.0000 "application " | TOPICAL_OINTMENT | Freq: Two times a day (BID) | CUTANEOUS | Status: DC

## 2012-03-17 MED ORDER — SEVELAMER CARBONATE 800 MG PO TABS
800.0000 mg | ORAL_TABLET | Freq: Three times a day (TID) | ORAL | Status: DC
  Administered 2012-03-17 (×2): 800 mg via ORAL
  Filled 2012-03-17 (×3): qty 1

## 2012-03-17 MED ORDER — ASPIRIN 81 MG PO CHEW
CHEWABLE_TABLET | ORAL | Status: AC
  Administered 2012-03-17: 81 mg
  Filled 2012-03-17: qty 1

## 2012-03-17 MED ORDER — METOPROLOL TARTRATE 12.5 MG HALF TABLET
12.5000 mg | ORAL_TABLET | Freq: Two times a day (BID) | ORAL | Status: DC
  Filled 2012-03-17 (×2): qty 1

## 2012-03-17 NOTE — Progress Notes (Signed)
Internal Medicine Teaching Service Attending Note Date: 03/17/2012  Patient name: Roger [name]  Medical record number: [id number]  Date of birth: 1940/07/15    This patient has been seen and discussed with the house staff. Please see their note for complete details. I concur with their findings. [name] will be discharged today with outpatient followup appointment with nephrology, cardiology and gastroenterology.  Lars Mage 03/17/2012, 2:51 PM

## 2012-03-17 NOTE — Telephone Encounter (Signed)
I have paged Jennye Moccasin, PA to inquire on appropriate follow up

## 2012-03-17 NOTE — Procedures (Signed)
I was present at this dialysis session. I have reviewed the session itself and made appropriate changes.   Lenny Pastel, P.A.C. [facility] 03/17/2012, 12:44 PM

## 2012-03-17 NOTE — Progress Notes (Signed)
Subjective: Pt denies complaints today. Returning from HD.  States he feels good not great.  Wife at beside.  He had a bowel movement w/in 24 hours but states it was not black and tarry but was dark.  Objective: Vital signs in last 24 hours: Filed Vitals:   03/17/12 0930 03/17/12 1000 03/17/12 1025 03/17/12 1030  BP: 109/53 104/47 92/45 105/53  Pulse: 75 71 75   Temp:   99 F (37.2 C)   TempSrc:   Oral   Resp: 25 22 25    Height:      Weight:      SpO2:   98%    Weight change: 7 lb 11.5 oz (3.5 kg)  Intake/Output Summary (Last 24 hours) at 03/17/12 1206 Last data filed at 03/17/12 1025  Gross per 24 hour  Intake    180 ml  Output      0 ml  Net    180 ml   General: lying in bed, nad, alert and oriented x 3 HEENT: [place]/at CV:  +systolic murmur prob 2/2 left upper extremity fistula, normal rate  Lungs: ctab Abdomen: soft, ntnd, +normal bs Extremities: warm, no cyanosis or edema  Neuro: CN 2-12 grossly intact, moving all 4 extremities.   Lab Results: Basic Metabolic Panel:  Lab 03/17/12 1610 03/16/12 0545 03/15/12 1147 03/14/12 0927 03/13/12 0320  NA 140 135 -- -- --  K 4.3 3.8 -- -- --  CL 100 94* -- -- --  CO2 26 28 -- -- --  GLUCOSE 117* 109* -- -- --  BUN 38* 29* -- -- --  CREATININE 8.40* 6.26* -- -- --  CALCIUM 8.6 9.7 -- -- --  MG -- -- -- 2.1 --  PHOS -- -- 2.6 -- 3.6    CBC:  Lab 03/17/12 1008 03/17/12 0740 03/16/12 1800 03/16/12 0545  WBC 4.7 4.5 -- --  NEUTROABS -- -- 3.1 2.3  HGB 7.3* 7.5* -- --  HCT 22.2* 23.2* -- --  MCV 105.2* 105.9* -- --  PLT 172 173 -- --    Misc. Labs: none  Micro Results: Recent Results (from the past 240 hour(s))  MRSA PCR SCREENING     Status: Abnormal   Collection Time   03/12/12  5:54 PM      Component Value Range Status Comment   MRSA by PCR POSITIVE (*) NEGATIVE Final   CULTURE, BLOOD (ROUTINE X 2)     Status: Normal (Preliminary result)   Collection Time   03/12/12  9:50 PM      Component Value Range Status  Comment   Specimen Description BLOOD RIGHT HAND   Final    Special Requests BOTTLES DRAWN AEROBIC ONLY 8CC   Final    Culture  Setup Time 03/13/2012 03:58   Final    Culture     Final    Value:        BLOOD CULTURE RECEIVED NO GROWTH TO DATE CULTURE WILL BE HELD FOR 5 DAYS BEFORE ISSUING A FINAL NEGATIVE REPORT   Report Status PENDING   Incomplete   CULTURE, BLOOD (ROUTINE X 2)     Status: Normal (Preliminary result)   Collection Time   03/12/12  9:56 PM      Component Value Range Status Comment   Specimen Description BLOOD LEFT ARM   Final    Special Requests BOTTLES DRAWN AEROBIC ONLY 5CC   Final    Culture  Setup Time 03/13/2012 03:58   Final    Culture  Final    Value:        BLOOD CULTURE RECEIVED NO GROWTH TO DATE CULTURE WILL BE HELD FOR 5 DAYS BEFORE ISSUING A FINAL NEGATIVE REPORT   Report Status PENDING   Incomplete    Studies/Results: No results found. Medications:  Scheduled Meds:    . acetaminophen  650 mg Oral Once  . allopurinol  50 mg Oral BID  . [COMPLETED] aspirin      . aspirin  162 mg Oral Daily  . atorvastatin  20 mg Oral q1800  . [COMPLETED] Chlorhexidine Gluconate Cloth  6 each Topical Q0600  . cinacalcet  30 mg Oral Q breakfast  . darbepoetin (ARANESP) injection - DIALYSIS  200 mcg Intravenous Q Tue-HD  . diphenhydrAMINE  25 mg Oral Once  . metoprolol tartrate  12.5 mg Oral BID  . multivitamin  1 tablet Oral Daily  . mupirocin ointment  1 application Nasal BID  . pantoprazole  40 mg Oral Q0600  . paricalcitol  3 mcg Intravenous Q T,Th,Sa-HD  . PARoxetine  20 mg Oral Daily  . sevelamer  800 mg Oral TID WC  . sodium chloride  3 mL Intravenous Q12H  . [DISCONTINUED] aspirin  81 mg Oral Daily  . [DISCONTINUED] darbepoetin (ARANESP) injection - DIALYSIS  150 mcg Intravenous Q Tue-HD  . [DISCONTINUED] sevelamer  2,400 mg Oral TID WC   Continuous Infusions:    . [DISCONTINUED] sodium chloride 20 mL/hr at 03/16/12 1235   PRN Meds:.acetaminophen,  LORazepam, sodium chloride, [DISCONTINUED] fentaNYL, [DISCONTINUED] midazolam  Assessment/Plan: 71 y.o male PMH CAD s/p multiple stenting, AF previously on Coumadin, history of grade 2 diastolic dysfunction, gout, 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    1.  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  FOBT + this admission.  -GI following. Upper endoscopy 03/14/12 with Dr. Leone Payor showed schatzki ring at GE junction. Biopsies were taken, 5 cm hiatal hernia. 12/4 colonoscopy with moderate internal hemorrhoids no source of bleeding noted otherwise normal mucosa.  If bleeding continues GI will do further studies in the future  -Hemoglobin 7.3 after transfusion 3 units this admission careful with transfusions due to warm antibodies.  Will transfuse 1 more unit 12/5 -goal hbg 10 with cardiac history   2. NSTEMI (Type II MI due to anemia with existing CAD) -Cards recommends myoview outpatient.    3. Reduced systolic LV function function and grade 2 diastolic dysfuntion -resume BB per cards at some point will discuss when today.  -continuing ASA 162 mg qd -Consider resuming ACEI at some point, consider nitrates and statin.  -He will f/u outpatient with cardiology and myoview in future.   4. Hypotension with history of Hypertension -BP improving  -will continue to monitor. Holding enalapril 10 mg qd, Amiodarone 200 mg qd, Imdur EF 30 mg, BB -with cardiac history cards recommends adding BB, Imdur, ACEI when BP improves -will discuss with Cards whether to add BB prior to possibly d/c today -will continue to monitor   5. History of CAD and grade 2 diastolic dysfunction -will ask Cards (following) whether to resume Effient prior to discharge. Cont. Asprin 81 mg qd increased to 162  mg qd  -He will f/u outpatient with cardiology for management and they will call him with an appointment at [facility] location. -holding Imdur   -  Cards said consider myoview outpatient   6. MRSA + nares -protocol and contact precautions  7. ESRD on HD T, TH, Sat -secondary to HTN -Renal following with fistula in left upper extremity, working  8.Paroxysmal atrial fibrillation  -Currently NSR -Holding Coumadin x 1 month.  9. F/E/N -will monitor daily labs -renal diet   10. DVT Px -none due to bleeding    Dispo: possibly d/c today post transfusion with f/u with PCP 03/21/12 at 9:30 for CBC and f/u with GI and Cardiology.   The patient does have a current PCP (Butch Penny G, MD), therefore will be requiring OPC follow-up after discharge with him.   The patient does not have transportation limitations that hinder transportation to clinic appointments.  .Services Needed at time of discharge: Y = Yes, Blank = No PT:   OT:   RN:   Equipment:   Other:     LOS: 5 days   Annett Gula [phone] 03/17/2012, 12:06 PM

## 2012-03-17 NOTE — Progress Notes (Signed)
Pt sent to dialysis.

## 2012-03-17 NOTE — Progress Notes (Signed)
     [facility] Daily Rounding Note 03/17/2012, 9:52 AM  SUBJECTIVE:       Feels well.  No nausea, no abd pain.  Stools brown.  OBJECTIVE:         Vital signs in last 24 hours:    Temp:  [98.7 F (37.1 C)-100.2 F (37.9 C)] 98.8 F (37.1 C) (12/05 0658) Pulse Rate:  [67-78] 75  (12/05 0930) Resp:  [10-35] 25  (12/05 0930) BP: (90-123)/(42-66) 114/57 mmHg (12/05 0900) SpO2:  [88 %-100 %] 100 % (12/05 0830) FiO2 (%):  [3 %] 3 % (12/05 0411) Weight:  [79.5 kg (175 lb 4.3 oz)-82.9 kg (182 lb 12.2 oz)] 79.5 kg (175 lb 4.3 oz) (12/05 1610) Last BM Date: 03/16/12 (bowel prep) General: chronically but not acutely ill looking.  Pale Heart: RRR Chest: clear B.  No resp distress Abdomen: soft, NT, ND.  No mass or HSM  Extremities: no pedal edema Neuro/Psych:  Pleasant, oriented x 3.  No tremor, no somnolence.   Intake/Output from previous day: 12/04 0701 - 12/05 0700 In: 180 [P.O.:180] Out: -   Intake/Output this shift:    Lab Results:  Basename 03/17/12 0740 03/16/12 1800 03/16/12 0545  WBC 4.5 5.6 4.3  HGB 7.5* 8.5* 8.6*  HCT 23.2* 26.4* 26.0*  PLT 173 206 177   BMET  Basename 03/17/12 0452 03/16/12 0545 03/15/12 1147  NA 140 135 138  K 4.3 3.8 4.0  CL 100 94* 97  CO2 26 28 30   GLUCOSE 117* 109* 89  BUN 38* 29* 21  CREATININE 8.40* 6.26* 4.17*  CALCIUM 8.6 9.7 8.9   LFT  Basename 03/15/12 1147  PROT --  ALBUMIN 3.1*  AST --  ALT --  ALKPHOS --  BILITOT --  BILIDIR --  IBILI --    ASSESMENT: *  Acute on chronic blood loss and anemia.   EGD, Colonoscopy revealed int hemorrhoids,  Schiatzkis ring, HH, but no true source for blood loss.  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 Hgb down 1 gram in last 24 hours.    PLAN: *  Cardiology to scrutinize the antiplatelet and anticoagulant meds, so far the plan is to restart Effient and Coumadin. *  One unit PRBCs ordered.    LOS: 5 days   Jennye Moccasin  03/17/2012, 9:52 AM Pager: [phone]

## 2012-03-17 NOTE — Telephone Encounter (Signed)
Patient will see gessner on 04/01/12 11/15

## 2012-03-17 NOTE — Progress Notes (Signed)
Pt awake, alert and oriented x 4 at time of discharge. Cardiac monitor and PIV removed. Discharge instructions given. Pt was told to wait in room and someone would be with him to take him out. Pt didn't wait for staff member to become available and walked out without any assistance from staff.

## 2012-03-17 NOTE — Progress Notes (Signed)
[facility]  Subjective: No complaints at this time.  Objective: Vital signs in last 24 hours: Temp:  [98.7 F (37.1 C)-100.2 F (37.9 C)] 98.8 F (37.1 C) (12/05 0658) Pulse Rate:  [67-78] 67  (12/05 0830) Resp:  [10-35] 20  (12/05 0830) BP: (90-123)/(42-66) 110/54 mmHg (12/05 0830) SpO2:  [88 %-100 %] 100 % (12/05 0830) FiO2 (%):  [3 %] 3 % (12/05 0411) Weight:  [79.5 kg (175 lb 4.3 oz)-82.9 kg (182 lb 12.2 oz)] 79.5 kg (175 lb 4.3 oz) (12/05 0658) Last BM Date: 03/16/12 (bowel prep)  Intake/Output from previous day: 12/04 0701 - 12/05 0700 In: 180 [P.O.:180] Out: -  Intake/Output this shift:    Medications Current Facility-Administered Medications  Medication Dose Route Frequency Provider Last Rate Last Dose  . acetaminophen (TYLENOL) tablet 650 mg  650 mg Oral Q4H PRN Larey Seat, MD   650 mg at 03/15/12 1425  . allopurinol (ZYLOPRIM) tablet 50 mg  50 mg Oral BID Belia Heman, MD   50 mg at 03/16/12 2128  . aspirin chewable tablet 81 mg  81 mg Oral Daily Lars Mage, MD   81 mg at 03/16/12 1527  . atorvastatin (LIPITOR) tablet 20 mg  20 mg Oral q1800 Belia Heman, MD   20 mg at 03/16/12 1717  . [COMPLETED] Chlorhexidine Gluconate Cloth 2 % PADS 6 each  6 each Topical Q0600 Tacey Heap, MD   6 each at 03/17/12 [phone]  . cinacalcet (SENSIPAR) tablet 30 mg  30 mg Oral Q breakfast Cecille Aver, MD   30 mg at 03/16/12 1528  . darbepoetin (ARANESP) injection 150 mcg  150 mcg Intravenous Q Tue-HD Cecille Aver, MD   150 mcg at 03/15/12 0816  . LORazepam (ATIVAN) tablet 0.5 mg  0.5 mg Oral Q4H PRN Larey Seat, MD   0.5 mg at 03/16/12 2128  . multivitamin (RENA-VIT) tablet 1 tablet  1 tablet Oral Daily Cecille Aver, MD   1 tablet at 03/16/12 1527  . mupirocin ointment (BACTROBAN) 2 % 1 application  1 application Nasal BID Tacey Heap, MD   1 application at 03/16/12 2128  . pantoprazole (PROTONIX) EC tablet 40 mg  40 mg Oral  Q0600 Dianah Field, PA   40 mg at 03/17/12 9604  . paricalcitol (ZEMPLAR) injection 3 mcg  3 mcg Intravenous Q T,Th,Sa-HD Cecille Aver, MD   3 mcg at 03/17/12 0737  . PARoxetine (PAXIL) tablet 20 mg  20 mg Oral Daily Belia Heman, MD   20 mg at 03/16/12 1527  . sevelamer (RENVELA) tablet 2,400 mg  2,400 mg Oral TID WC Belia Heman, MD   2,400 mg at 03/16/12 1717  . sodium chloride 0.9 % injection 3 mL  3 mL Intravenous Q12H Neema Davina Poke, MD   3 mL at 03/16/12 2200  . sodium chloride 0.9 % injection 3 mL  3 mL Intravenous PRN Belia Heman, MD      . [DISCONTINUED] 0.9 %  sodium chloride infusion   Intravenous Continuous Iva Boop, MD 20 mL/hr at 03/16/12 1235    . [DISCONTINUED] fentaNYL (SUBLIMAZE) injection    PRN Iva Boop, MD   25 mcg at 03/16/12 1325  . [DISCONTINUED] midazolam (VERSED) 5 MG/5ML injection    PRN Iva Boop, MD   2 mg at 03/16/12 1323    PE: General appearance: alert, cooperative and no distress Lungs: clear to  auscultation bilaterally Heart: regular rate and rhythm and 2/6 sys MM Extremities: No LEE Pulses: 2+ right radial, 1+ DPs. Skin: Warm and dry Neurologic: Grossly normal  Lab Results:   Basename 03/17/12 0740 03/16/12 1800 03/16/12 0545  WBC 4.5 5.6 4.3  HGB 7.5* 8.5* 8.6*  HCT 23.2* 26.4* 26.0*  PLT 173 206 177   BMET  Basename 03/17/12 0452 03/16/12 0545 03/15/12 1147  NA 140 135 138  K 4.3 3.8 4.0  CL 100 94* 97  CO2 26 28 30   GLUCOSE 117* 109* 89  BUN 38* 29* 21  CREATININE 8.40* 6.26* 4.17*  CALCIUM 8.6 9.7 8.9   PT/INR  Basename 03/14/12 0927  LABPROT 16.4*  INR 1.35   Cholesterol No results found for this basename: CHOL in the last 72 hours Cardiac Enzymes No components found with this basename: TROPONIN:3, CKMB:3  Studies/Results: Colonoscopy ENDOSCOPIC IMPRESSION:  1. Moderate sized internal hemorrhoids  2. The colonic mucosa appeared normal throughout the entire  examined colon - adequate  prep - no surce of GI bleeding  RECOMMENDATIONS:  Discussed with Dr. Rennis Golden - he will see what anti-PLT and anti-coag  regimen is best in this scenario, anticipate modifying it, ?  possibly stopping Effient - no further studies at this time - if  has more bleeding may need capsule endoscopy of small bowel - 2011  study showed likely AVM's out of reach of our scopes (did have a  push enteroscopy (negative) in 2011 also  Consider repeat colonoscopy in 5 years given family history of colon  cancer but age and co-morbidities may preclude  eSigned: Iva Boop, MD, [facility] 03/16/2012 2:01 PM   Assessment/Plan  Principal Problem:  *Acute blood loss anemia Active Problems:  NSTEMI (non-ST elevated myocardial infarction) - Type II MI due to Anemia with existing CAD  CAD, MI CFX BMS 12/11 with ISR DES (as well as OM) May 2013  HTN (hypertension)  PAD (peripheral artery disease): 60% bilateral common iliac stenosis   HLD (hyperlipidemia)  Anticoagulated on warfarin  GI bleeding, secondary to Effient/Coumadin/ 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  No source for GIB.  Hgb decreased from 8.5 yesterday to 7.5 today.  BB, effient, coumadin held.  Recommend transfusing at least one unit PRBCs.      LOS: 5 days    HAGER, BRYAN 03/17/2012 8:43 AM  I have seen and examined the patient along with Wilburt Finlay, PA.  I have reviewed the chart, notes and new data.  I agree with PA's note.  PLAN: Difficult situation. At this point, from CAD/stent perspective, Effient can probably be stopped with a low risk of late stent thrombosis. The risk of warfarin appears prohibitive. Aspirin 162 mg a day for  CAD prophylaxis and stroke prevention appears to offer the best balance between bleeding and thrombotic risk, in my opinion. Resume beta blockers, 1/2 off previous dose, titrate back up as BP allows.  Thurmon Fair, MD, [facility] [phone] 03/17/2012, 10:54 AM

## 2012-03-17 NOTE — Progress Notes (Signed)
Subjective:  On hd, tolerating hd ,feeling cold on hd," Need another blanket"  On 37 degree temp at [facility] ([place])  HD, noted 36 degree here ,denies melena, no sob or cp, noted yest. Colonoscopy  Int Hem .no active bleed. Objective Vital signs in last 24 hours: Filed Vitals:   03/17/12 0714 03/17/12 0730 03/17/12 0800 03/17/12 0830  BP: 117/61 117/58 112/63 110/54  Pulse: 70 70 67 67  Temp:      TempSrc:      Resp: 25 23 21 20   Height:      Weight:      SpO2:    100%   Weight change: 3.5 kg (7 lb 11.5 oz)  Intake/Output Summary (Last 24 hours) at 03/17/12 0859 Last data filed at 03/17/12 0600  Gross per 24 hour  Intake    180 ml  Output      0 ml  Net    180 ml   Labs: Basic Metabolic Panel:  Lab 03/17/12 9811 03/16/12 0545 03/15/12 1147 03/13/12 0320  NA 140 135 138 --  K 4.3 3.8 4.0 --  CL 100 94* 97 --  CO2 26 28 30  --  GLUCOSE 117* 109* 89 --  BUN 38* 29* 21 --  CREATININE 8.40* 6.26* 4.17* --  CALCIUM 8.6 9.7 8.9 --  ALB -- -- -- --  PHOS -- -- 2.6 3.6   Liver Function Tests:  Lab 03/15/12 1147 03/13/12 0320  AST -- --  ALT -- --  ALKPHOS -- --  BILITOT -- --  PROT -- --  ALBUMIN 3.1* 3.0*   No results found for this basename: LIPASE:3,AMYLASE:3 in the last 168 hours No results found for this basename: AMMONIA:3 in the last 168 hours CBC:  Lab 03/17/12 0740 03/16/12 1800 03/16/12 0545 03/15/12 1843 03/15/12 1147  WBC 4.5 5.6 4.3 -- --  NEUTROABS -- 3.1 2.3 2.5 --  HGB 7.5* 8.5* 8.6* -- --  HCT 23.2* 26.4* 26.0* -- --  MCV 105.9* 106.9* 107.4* 105.2* 104.7*  PLT 173 206 177 -- --   Cardiac Enzymes:  Lab 03/15/12 1147 03/15/12 0448 03/14/12 1834 03/14/12 0927 03/13/12 1848  CKTOTAL -- -- -- -- --  CKMB -- -- -- -- --  CKMBINDEX -- -- -- -- --  TROPONINI 0.73* 0.88* 1.45* 2.13* 2.14*   CBG: No results found for this basename: GLUCAP:5 in the last 168 hours  Iron Studies: No results found for this basename: IRON,TIBC,TRANSFERRIN,FERRITIN in the last  72 hours Studies/Results: No results found. Medications:    . [DISCONTINUED] sodium chloride 20 mL/hr at 03/16/12 1235      . allopurinol  50 mg Oral BID  . aspirin  81 mg Oral Daily  . atorvastatin  20 mg Oral q1800  . [COMPLETED] Chlorhexidine Gluconate Cloth  6 each Topical Q0600  . cinacalcet  30 mg Oral Q breakfast  . darbepoetin (ARANESP) injection - DIALYSIS  150 mcg Intravenous Q Tue-HD  . multivitamin  1 tablet Oral Daily  . mupirocin ointment  1 application Nasal BID  . pantoprazole  40 mg Oral Q0600  . paricalcitol  3 mcg Intravenous Q T,Th,Sa-HD  . PARoxetine  20 mg Oral Daily  . sevelamer  2,400 mg Oral TID WC  . sodium chloride  3 mL Intravenous Q12H   I  have reviewed scheduled and prn medications.  Physical Exam: General: Alert, NAD Heart: RRR, 1/6 sem lsb, no rub  Lungs: CTA bilaterally  Abdomen: BS= =, soft ,nontender  Extremities: Dialysis Access: no pedal edema, L U A Avf patent on hd   HD orders - [place] [facility], AVF, 3 hours and 15 min, 2 K, 2.25 calc bath, EDW 76 kg, zemplar 3 mcg per HD and epo 19,800 units per HD, heparin 2000 units per treatment.   Assessment/ Plan:  1. GIB/ heme+ stools w anemia-- Hb 5.9 at lowest, 7.5 HGB this am- 8.5 yesterday  ??? Will recheck hgb on hd/s/p total 3u prbc's since admit Coumadin and Effient on hold, INR down, getting ASA only now.  colonoscopy  Showing Int. Hemorrhoids . EGD showed Schatzki ring only.  2. ESRD, cont tts HD TTS Azucena Kuba) check post wt standing  79.5 kg pre hd , [facility]  Leaving above edw- with bp down, no excess vol by exam/ npo recently for gi testing  Will need to raise edw ? 78.5-79kg. 3. HTN/volume- 95/53 on HD asymp.[name] Home BP meds on hold (lopressor and vasotec).  As above- over dry weight but exam more c/w low volume. Will check standing weight today if possible. Keep even with HD.  4. Anemia of CKD and ABL; getting darbe 150/wk in place of outpt EPO will raise to max  200 weekly, recheck  Stat hgb on hd   Out pt. 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 All of these meds CA 8.6/ phos 2.6 on 12/03, decrease to Renvela 1  6. Fevers-- had fever on admission which resolved, then had temp spike after prbc transfusion yesterday. Initial blood cx's and CXR all negative. Not getting abx. 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 Hx CAD with stents= SE card. following  Lenny Pastel, PA-C [facility] Beeper [phone] 03/17/2012,8:59 AM  LOS: 5 days   Patient seen and examined and agree with assessment and plan as above.  Roger Moselle  MD [facility] [phone] pgr    [phone] cell 03/17/2012, 4:28 PM

## 2012-03-17 NOTE — Telephone Encounter (Signed)
Family asked that I mail the appt information

## 2012-03-17 NOTE — Progress Notes (Signed)
D/W anticoagulation with housestaff (they have paged me regarding this question), Dr. Leone Payor (yesterday) and Dr. 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 Dr. Herbie Baltimore feels strongly that we hold warfarin and aspirin and treat with Effient monotherapy (please see his initial note regarding this).    Chrystie Nose, MD, [facility] Attending Cardiologist [facility]

## 2012-03-17 NOTE — Progress Notes (Signed)
Agree with Ms. Heron Nay note

## 2012-03-17 NOTE — Progress Notes (Deleted)
Internal Medicine Teaching [facility] Discharge Note  Name: Roger [name] MRN: [id number] DOB: [birth year] 71 y.o.  Date of Admission: 03/12/2012 11:08 AM Date of Discharge: 03/17/2012 Attending Physician: Dr. Eben Burow  Discharge Diagnosis: 1.  *Acute blood loss anemia with suspected GI bleeding on Effient/Coumadin with history of small bowel arteriovenous malformations (AVMs) 2. NSTEMI (non-ST elevated myocardial infarction) - Type II MI due to Anemia with existing CAD 3. Systolic congestive heart failure with reduced left ventricular function (45-50% echo 03/2012), NYHA class 1 and history of diastolic dysfunction 4. Esophageal dysphagia  5. History of CAD with multiple stents (DES and history of BMS failure) 6. Hypotension with history of (hypertension) 7. 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 Anticoagulated on warfarin for Atrial fibrillation  GI bleeding, secondary to Effient/Coumadin/ suspected SB AVMs 9. History of aortic stenosis, moderate, December 2013 10. Fever 11. 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      amiodarone 200 MG tablet   Commonly known as: PACERONE   Take 200 mg by mouth daily.      EFFIENT 10 MG Tabs   Generic drug: prasugrel   Take 10 mg by mouth Daily. 60mg  today, then 10mg  daily      multivitamin Tabs tablet   Take 1 tablet by mouth every morning.      mupirocin ointment 2 %   Commonly known as: BACTROBAN   Apply 1 application topically 2 (two)  times daily.      nitroGLYCERIN 0.4 MG SL tablet   Commonly known as: NITROSTAT   Place 1 tablet (0.4 mg total) under the tongue every 5 (five) minutes as needed.      pantoprazole 40 MG tablet   Commonly known as: PROTONIX   Take 40 mg by mouth 2 (two) times daily.      PARoxetine 20 MG tablet   Commonly known as: PAXIL   Take 1 tablet (20 mg total) by mouth daily.      rosuvastatin 10 MG tablet   Commonly known as: CRESTOR   Take 10 mg by mouth daily.      sevelamer 800 MG tablet   Commonly known as: RENVELA   Take 2,400 mg by mouth 3 (three) times daily with meals.      TAGAMET PO   Take 1 tablet by mouth daily as needed. For acid reflux.         Disposition and follow-up:   Mr.Roger [name] was discharged from [facility] in stable condition.  At the hospital follow up visit please address  1) CBC repeat.  Will probably need serial CBC repeats in the future.  2) Follow up blood cultures-negative to date thus far 3) If blood pressure increases decide whether to resume  Metoprolol (low dose), ACE inhibitor, Imdur.  Metoprolol should be resumed first.  4) Follow up biopsies from upper endoscopy [facility] 5) Needs outpatient myoview 6) Resume Amiodarone for Atrial Fibrillation if deemed appropriate.  7) consider further work up for anemia (i.e hemolysis, etc.) 8) Resume Coumadin 1 months post discharge for atrial fibrillation 9) He needs to follow up with his Primary Care Provider 03/21/12, Dr. Leone Payor 04/01/12 and [facility] will call him with an appointment at the [facility] location.    Follow-up Appointments:  Discharge Orders    Future Appointments: Provider: Department: Dept Phone: Center:   04/01/2012 11:15 AM Iva Boop, MD [facility] [phone] [facility]     Future Orders Please Complete By Expires   Increase activity slowly      Discharge instructions      Comments:    Please follow up with your primary doctor 03/21/12 at 10: 30 am.  [facility] [phone] and [facility] [phone] will call you with an appointment.  If they do no call them to be seen within the next 2-4 weeks.  Thank you. Nice to participate in your care.   Take Effient. Stop Aspirin Stop Metoprolol, ACE inhibitor, Imdur for now until further instructed   If you notice black tarry or bright red stool notify your doctors      Consultations:  1. Internal Medicine-Dr. Eben Burow 2. Beverley Fiedler, MD, Dr. Leone Payor (performed upper endoscopy), Dr. Loreta Ave, Jennye Moccasin PA 3. Nephrology Cecille Aver, MD), Dr. Arlean Hopping 4. Dr. Herbie [place] [facility]), Dr Royann Shivers, Diona Fanti (PA), Dr. Rennis Golden  Procedures Performed: echo, Upper endoscopy (Dr. Leone Payor)   Dg Chest [facility] 1 View  03/13/2012  *RADIOLOGY REPORT*  Clinical Data: Cough and fever.  PORTABLE CHEST - 1 VIEW  Comparison: 10/14/2011  Findings: Stable heart size and aortic tortuosity.  No evidence of pulmonary infiltrate, edema or pleural fluid.  IMPRESSION: No active disease.   Original Report Authenticated By: Irish Lack, M.D.     2D Echo: 03/15/12  Study Conclusions  - Left ventricle: The cavity size was normal. Wall thickness was increased in a pattern of mild LVH. Systolic function was mildly reduced. The estimated ejection fraction was in the range of 45% to 50%. Severe hypokinesis of the entireinferolateral myocardium. Moderate hypokinesis of the anterolateral myocardium. Mild hypokinesis of the distalanteroseptal myocardium. Features are consistent with a pseudonormal left ventricular filling pattern, with concomitant abnormal relaxation and increased filling pressure (grade 2 diastolic dysfunction). Doppler parameters are consistent with elevated mean left atrial filling pressure. - Aortic valve: Valve mobility was moderately restricted. There was moderate stenosis. Trivial  regurgitation. Valve area: 1.28cm^2(VTI). Valve area: 1.34cm^2 (Vmax). - Mitral valve: Calcified annulus. Mild regurgitation. Valve area by continuity equation (using LVOT flow): 2.39cm^2. - Pulmonary arteries: Systolic pressure was moderately to severely increased. PA peak pressure: 64mm Hg (S). Transthoracic echocardiography. M-mode, complete 2D, spectral Doppler, and color Doppler. Height: Height: 172.7cm. Height: 68in. Weight: Weight: 79.5kg. Weight: 175lb. Body mass index: BMI: 26.7kg/m^2. Body surface area: BSA: 1.46m^2. Blood pressure: 86/46. Patient status: Inpatient. Location: ICU/CCU     Admission HPI:  Patient is a 71 year old male with past medical history of coronary artery disease status post stenting, atrial fibrillation anticoagulated on warfarin, gout, end-stage renal disease from hypertensive nephropathy on dialysis, and GERD presenting with a couple weeks complaint of chest discomfort as well as one week of dizziness and generalized weakness. Patient presented to the ED from dialysis due to reports of  chest pain and measured systolic pressure of 70. Patient reported to the ED provided he was having exertional chest pain, but upon our questioning he denied any exertional component. He reports a couple weeks of mild chest discomfort that he felt to not warrant nitroglycerin therapy at home. It does not feel like his prior MIs. He says it begins over his left-sided chest and radiates across the right and is occasionally sharp. He does not noticed a pattern of onset, and this it occurs at rest. He denies any palpitations, radiation to the neck, radiation to the left arm. He most recently had heart catheterization with stenting to the LAD and balloon angioplasty of a diagonal branch in June of 2013. Prior to that, he is bare-metal stent placed in the circumflex in 2011 after STEMI, with repeat drug-eluting stent to the circumflex as well as OM branch in May 2013 after NSTEMI. Also  concerning at dialysis was a hemoglobin of 7.7, down from his baseline of around 10. He's anticoagulated on warfarin for atrial fibrillation and effient in setting of coronary artery stenting. Upon further questioning, patient endorses dark, tarry stools for the past week. He says he has never had dark stools in the past although he has had positive fecal occult blood testing. He does report worsening dizziness and weakness over the past week at home, but no shortness of breath. He denies hematemesis, abdominal pain, BRBPR. Has had some nausea. Most recently, he had EGD in December 2012 which demonstrated small hiatal hernia and nonbleeding peripyloric ulcers. He was also noted to have a Schatzki ring that was patent at the time. 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 Nuclear tagged red blood cell scan was negative. Limited colonoscopy in January 2011 showed only internal hemorrhoids.  Patient has had bleeding complications from supratherapeutic anticoagulation the past, but never from the GI tract. In January 2013, he required blood transfusion for profuse bleeding from AV fistula site with a noted INR of 7 at the time. He reports that he is taking 2.5 mg of warfarin currently.  Of note, patient reports worsening solid food dysphagia over the past 3 months. Was told by prior provider at that Schatzki ring was responsible for symptoms of dysphagia, but was widely patent at the time of endoscopy.   Admission Physical Exam:  Blood pressure 87/39, pulse 66, temperature 98.9 F (37.2 C), temperature source Oral, resp. rate 22, SpO2 100.00%.  Vitals reviewed.  General: resting in bed, NAD  HEENT: pale conjunctivae, PERRL, EOMI, no scleral icterus  Cardiac: regular rhythm with HR in 50s, harsh 2/6 systolic murmur at RUSB and 3/6 systolic murmur at apex and radiating to L axilla  Pulm: clear to auscultation bilaterally, no wheezes, rales, or rhonchi  Abd: soft, nontender, nondistended, BS  present  Rectum: External hemorrhoid, nonthrombosed and non bleeding. No blood in anal vault.  Ext: L UE AVF access w bruit and thrill, without evidence of infection or bleeding. Legs warm and well perfused, no pedal edema.  Neuro: alert and oriented X3, cranial nerves II-XII grossly intact, strength and sensation to light touch equal in bilateral upper and lower extremities   Hospital Course by problem list: 1.  *Acute blood loss anemia with suspected GI bleeding on Effient/Coumadin  He presented with a one week history of melena, weakness, dizziness, hypotension and intermittent chest discomfort after dialysis on 03/12/12 and FOBT positive stools.  Hemoglobin was 7.6 on admission.  He did have black tarry stools this admission. He was  placed on Protonix 40 mg intravenous bid then transitioned to oral Protonix.  We transfused a total of 4 units pRBCs though his hemoglobin would still drop after transfusion this admission and there still may be a source of bleeding though not identified.  His hemoglobin on day of discharge after the last unit of blood was 8.9.  He has a history of small bowel arteriovenous malformations (AVMs) and pre-pyloric ulcerations in the past (2011).  His INR was 2.46 this admission and he was given one dose of vitamin K.  He also has a history of GI bleed in the past with negative nuclear scan in 2011.  Upper endoscopy this admission showed 5 cm hiatal hernia, patent Schatzki's ring at gastroesophageal junction, no evidence of bleeding.  He had a colonoscopy this admission which did not identify a source of bleeding but showed moderate internal hemorrhoids.  We held Coumadin, Effient this admission but resumed Effient upon discharge.  Goal hemoglobin with cardiac history of probably at least 10.0.    2. NSTEMI (non-ST elevated myocardial infarction) - Type II MI due to Anemia with existing CAD We trended his cardiac enzymes which elevated 0.54 to 2.52 then back down to 0.77.  He  did have some ST depressions in V2 to V6 as well which improved by discharge.  He was asymptomatic without chest pain.  Cardiology was consulted and recommended an outpatient myoview.     3. Systolic congestive heart failure with reduced left ventricular function (45-50% echo 03/2012), NYHA class 1 and history of diastolic dysfunction 03/2012 echo with mild decrease in systolic dysfunction with EF 45-50%, mild LVH, severe hypokinesis inferiorlaterally and mild hypokinesis anteriorlaterally. Grade 2 diastolic dysfunction. Previous echo 08/2011 with EF 50-55% and moderate inferior hypokinesis and grade 2 diastolic dysfunction.  He will need to have a myoview outpatient and follow up with cardiology.  His beta blocker was not resumed this admission due to hypotension but will need to be resumed.  He will also need his ACE inhibitor resumed at some point.  He is on a statin and nitroglycerin prn.     4. Esophageal dysphagia  He had an upper endoscopy which showed Schatzki's ring as before which was not dilated at this time due to him being on Effient and Coumadin.    5. History of CAD Extensive coronary artery disease history.  He has previous DES stenting mid LAD and balloon angioplasty of diagonal branch 09/2011. History of NSTEMI CFX BMS 12/11 with repeat overlapping DES to left cicumflex (as well as OM) May 2013 (08/22/2011) after NSTEMI.  This was done by Dr. Herbie [place].  We held his Imdur this admission which will need to be resumed in the future.    6. Hypotension with history of (hypertension) Hypotension was probably secondary to acute blood loss.  We held his Metoprolol, Imdur, Amiodarone, ACE inhibitor this admission.  Once blood pressure trends up he will need to be resumed on beta blocker first, then ACE inhibitor possibly at lower doses.      7. 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    8. Anticoagulated on warfarin for Atrial fibrillation We  held his Coumadin this admission due to acute blood loss anemia.  We also held his Amiodarone this admission due to hypotension but he remained in normal sinus rhythm.  He will need to be restarted on Amiodarone if deemed appropriate.  GI bleeding was thought to be secondary to Effient/Coumadin along with  history of small bowel AVMs.  We recommend holding Coumadin x 1 month post discharge.  The risks of stroke were explained to the patient but the risk of bleeding was more significant. Consider Plavix depending on P2Y12 levels or Brillinta.  He was started on Aspirin 81 mg this admission when we were holding Coumadin.  Aspirin was increased to 162 mg daily but Aspirin was discontinued by discharge.      9. History of aortic stenosis, moderate, December 2013  10. Fever, resolved  He had fevers this admission T max 102.5.  We thought that his fever may be related to transfusion reaction as his blood has warm antibodies and the blood bank needed to be called for particular pRBCs which was not exactly a match but as close as it could to a match.  He also developed shortness of breath after a particular transfusion and was placed on oxygen and his shortness of breath resolved.   11. 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   Discharge Labs:  Results for RAINEN, VANROSSUM (MRN [id number]) as of 03/17/2012 17:00  Ref. Range 03/13/2012 13:00 03/13/2012 18:48 03/14/2012 09:27 03/14/2012 18:34 03/15/2012 04:48 03/15/2012 11:47 03/16/2012 05:45  03/17/2012 04:52  Sodium Latest Range: 137-147 mmol/L   139  135 138 135 140  Potassium Latest Range: 3.5-5.1 mEq/L   4.7  5.0 4.0 3.8 4.3  Chloride Latest Range: 96-112 mEq/L   93 (L)  91 (L) 97 94 (L) 100  CO2 Latest Range: 19-32 mEq/L   32  27 30 28 26   BUN Latest Range: 6-23 mg/dL   59 (H)  75 (H) 21 29 (H) 38 (H)  Creatinine Latest Range: 0.50-1.35 mg/dL   9.14 (H)  78.29 (H) 5.62 (H) 6.26 (H) 8.40 (H)  Calcium Latest Range: 8.4-10.5 mg/dL   9.4  9.1 8.9 9.7 8.6  GFR calc non Af Amer Latest Range: >90 mL/min   5 (L)  4 (L) 13 (L) 8 (L) 6 (L)  GFR calc Af Amer Latest Range: >90 mL/min   6 (L)  5 (L) 15 (L) 9 (L) 7 (L)  Glucose Latest Range: 70-99 mg/dL   99  130 (H) 89 865 (H) 117 (H)  Phosphorus Latest Range: 2.3-4.6 mg/dL      2.6    Magnesium Latest Range: 1.5-2.5 mg/dL   2.1       Albumin No range found      3.1 (L)    Troponin I Latest Range: <0.30 ng/mL 2.52 (HH) 2.14 (HH) 2.13 (HH) 1.45 (HH) 0.88 (HH) 0.73 (HH)     Results for TATEZadrian, Mccauley (MRN [id number]) as of 03/17/2012 17:00  Ref. Range 03/12/2012 12:31 03/12/2012 15:58 03/12/2012 20:31 03/13/2012 02:40  Sodium Latest Range: 137-147 mmol/L 142     Potassium Latest Range: 3.5-5.1 mEq/L 3.1 (L)     Chloride Latest Range: 96-112 mEq/L 92 (L)     CO2 Latest Range: 19-32 mEq/L  38 (H)     BUN Latest Range: 6-23 mg/dL 15     Creatinine Latest Range: 0.50-1.35 mg/dL 1.61 (H)     Calcium Latest Range: 8.4-10.5 mg/dL 9.5     GFR calc non Af Amer Latest Range: >90 mL/min 14 (L)     GFR calc Af Amer Latest Range: >90 mL/min 16 (L)     Glucose Latest Range: 70-99 mg/dL 096 (H)     Troponin I Latest Range: <0.30 ng/mL  <0.30 <0.30 0.54 (HH)   Results for TATEKainon, Varady (MRN [id number]) as of 03/17/2012 17:00  Ref. Range 03/14/2012 20:19 03/15/2012 11:47 03/15/2012 18:43 03/16/2012 05:45 03/16/2012 18:00 03/17/2012 07:40 03/17/2012 10:08  WBC Latest Range: 4.0-10.5 K/uL 4.6 4.7 4.2 4.3 5.6 4.5 4.7  RBC Latest Range: 4.22-5.81 MIL/uL 2.06 (L) 2.34  (L) 2.13 (L) 2.42 (L) 2.47 (L) 2.19 (L) 2.11 (L)  Hemoglobin Latest Range: 13.0-17.0 g/dL 7.5 (L) 8.2 (L) 7.4 (L) 8.6 (L) 8.5 (L) 7.5 (L) 7.3 (L)  HCT Latest Range: 39.0-52.0 % 22.3 (L) 24.5 (L) 22.4 (L) 26.0 (L) 26.4 (L) 23.2 (L) 22.2 (L)  MCV Latest Range: 78.0-100.0 fL 108.3 (H) 104.7 (H) 105.2 (H) 107.4 (H) 106.9 (H) 105.9 (H) 105.2 (H)  MCH Latest Range: 26.0-34.0 pg 36.4 (H) 35.0 (H) 34.7 (H) 35.5 (H) 34.4 (H) 34.2 (H) 34.6 (H)  MCHC Latest Range: 30.0-36.0 g/dL 91.4 78.2 95.6 21.3 08.6 32.3 32.9  RDW Latest Range: 11.5-15.5 % 26.3 (H) 26.7 (H) 26.8 (H) 26.9 (H) 25.9 (H) 25.1 (H) 24.9 (H)  Platelets Latest Range: 150-400 K/uL 167 180 150 177 206 173 172  Neutrophils Relative Latest Range: 43-77 %  65 58 53 55    Lymphocytes Relative Latest Range: 12-46 %  21 20 28 27     Monocytes Relative Latest Range: 3-12 %  8 14 (H) 11 11    Eosinophils Relative Latest Range: 0-5 %  6 (H) 7 (H) 7 (H) 6 (H)    Basophils Relative Latest Range: 0-1 %  0 1 1 1     NEUT# Latest Range: 1.7-7.7 K/uL  3.0 2.5 2.3 3.1    Lymphocytes Absolute Latest Range: 0.7-4.0 K/uL  1.0 0.8 1.2 1.5    Monocytes Absolute Latest Range: 0.1-1.0 K/uL  0.4 0.6 0.5 0.6    Eosinophils Absolute Latest Range: 0.0-0.7 K/uL  0.3 0.3 0.3 0.3    Basophils Absolute Latest Range: 0.0-0.1 K/uL  0.0 0.0 0.0 0.1    RBC Morphology No range found  POLYCHROMASIA PRESENT POLYCHROMASIA PRESENT POLYCHROMASIA PRESENT POLYCHROMASIA PRESENT     Results for TEAGEN, MCLEARY (MRN [id number]) as of 03/17/2012 17:00  Ref. Range 03/12/2012 12:31 03/12/2012 14:36 03/12/2012 15:58 03/12/2012 20:25 03/12/2012 22:50 03/13/2012 02:40 03/13/2012 12:18 03/13/2012 18:40 03/14/2012 09:27  WBC Latest Range: 4.0-10.5 K/uL 5.8 5.8 5.1 5.3 5.3 4.5 4.6 4.2 4.4  RBC Latest Range: 4.22-5.81 MIL/uL 1.94 (L) 1.88 (L) 1.80 (L) 1.63 (L) 1.60 (L) 1.47 (L) 2.35 (L) 2.16 (L) 2.02 (L)  Hemoglobin Latest Range: 13.0-17.0 g/dL 7.6 (L) 7.6 (L) 7.3 (L) 6.4 (LL) 6.2 (LL) 5.9 (LL) 8.2 (L) 7.7  (L) 7.2 (L)  HCT Latest Range: 39.0-52.0 % 23.4 (L) 22.8 (L) 21.8 (L) 20.0 (L) 19.5 (L) 18.2 (L) 25.0 (L) 23.0 (L) 21.4 (L)  MCV Latest Range: 78.0-100.0 fL 120.6 (H) 121.3 (H) 121.1 (H) 122.7 (H) 121.9 (H) 123.8 (H) 106.4 (H) 106.5 (H) 105.9 (H)  MCH Latest Range: 26.0-34.0 pg 39.2 (H) 40.4 (H) 40.6 (H) 39.3 (H) 38.8 (  H) 40.1 (H) 34.9 (H) 35.6 (H) 35.6 (H)  MCHC Latest Range: 30.0-36.0 g/dL 16.1 09.6 04.5 40.9 81.1 32.4 32.8 33.5 33.6  RDW Latest Range: 11.5-15.5 % 16.4 (H) 16.4 (H) 16.6 (H) 16.9 (H) 16.5 (H) 17.1 (H) 27.0 (H)  26.1 (H)  Platelets Latest Range: 150-400 K/uL 223 211 208 225 192 185 182 168 167  Neutrophils Relative Latest Range: 43-77 % 54          Lymphocytes Relative Latest Range: 12-46 % 26          Monocytes Relative Latest Range: 3-12 % 12          Eosinophils Relative Latest Range: 0-5 % 8 (H)          Basophils Relative Latest Range: 0-1 % 1          NEUT# Latest Range: 1.7-7.7 K/uL 3.1          Lymphocytes Absolute Latest Range: 0.7-4.0 K/uL 1.5          Monocytes Absolute Latest Range: 0.1-1.0 K/uL 0.7          Eosinophils Absolute Latest Range: 0.0-0.7 K/uL 0.5          Basophils Absolute Latest Range: 0.0-0.1 K/uL 0.0          Results for TATEKourtland, Coopman (MRN [id number]) as of 03/17/2012 17:00  Ref. Range 03/12/2012 12:14 03/13/2012 02:40 03/13/2012 12:47 03/14/2012 09:27  Prothrombin Time Latest Range: 11.6-15.2 seconds 25.5 (H) 24.7 (H) 18.9 (H) 16.4 (H)  INR Latest Range: 0.00-1.49  2.46 (H) 2.35 (H) 1.64 (H) 1.35   Results for TATEDylann, Gallier (MRN [id number]) as of 03/17/2012 17:00  Ref. Range 03/12/2012 12:31 03/13/2012 03:20 03/14/2012 09:27 03/15/2012 04:48 03/15/2012 11:47 03/16/2012 05:45 03/17/2012 04:52  Glucose Latest Range: 70-99 mg/dL 469 (H) 94 99 629 (H) 89 109 (H) 117 (H)  Results for Rietz, DAEVEON ZWEBER (MRN [id number]) as of 03/17/2012 17:00  Ref. Range 03/12/2012 17:54  MRSA by PCR-nares Latest Range: NEGATIVE  POSITIVE (A)   03/12/12 Blood cultures negative to  date and pending  Results for Timko, ALFONSO CARDEN (MRN [id number]) as of 03/17/2012 17:00  Ref. Range 03/12/2012 12:02  Fecal Occult Blood, POC No range found POSITIVE   Results for orders placed during the hospital encounter of 03/12/12 (from the past 24 hour(s))  CBC     Status: Abnormal   Collection Time   03/17/12  5:59 PM      Component Value Range   WBC 4.6  4.0 - 10.5 K/uL   RBC 2.63 (*) 4.22 - 5.81 MIL/uL   Hemoglobin 8.9 (*) 13.0 - 17.0 g/dL   HCT 64.4 (*) 03.4 - 74.2 %   MCV 103.8 (*) 78.0 - 100.0 fL   MCH 33.8  26.0 - 34.0 pg   MCHC 32.6  30.0 - 36.0 g/dL   RDW 59.5 (*) 63.8 - 75.6 %   Platelets 178  150 - 400 K/uL    Signed: Annett Gula 03/17/2012, 5:21 PM   Time Spent on Discharge: <30 minutes  Services Ordered on Discharge: none Equipment Ordered on Discharge: none

## 2012-03-18 LAB — TYPE AND SCREEN
ABO/RH(D): O POS
Antibody Screen: POSITIVE
Donor AG Type: NEGATIVE
Unit division: 0

## 2012-03-18 NOTE — Discharge Summary (Signed)
Internal Medicine Teaching [facility] Discharge Note  Name: Roger [name] MRN: [id number] DOB: 1940/09/30 71 y.o.  Date of Admission: 03/12/2012 11:08 AM Date of Discharge: 03/17/2012 Attending Physician: Dr. Eben Burow  Discharge Diagnosis: 1.  *Acute blood loss anemia with suspected GI bleeding on Effient/Coumadin with history of small bowel arteriovenous malformations (AVMs) 2. NSTEMI (non-ST elevated myocardial infarction) - Type II MI due to Anemia with existing CAD 3. Systolic congestive heart failure with reduced left ventricular function (45-50% echo 03/2012), NYHA class 1 and history of diastolic dysfunction 4. Esophageal dysphagia   5. History of CAD with multiple stents (DES and history of BMS failure) 6. Hypotension with history of (hypertension) 7. 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 Anticoagulated on warfarin for Atrial fibrillation  GI bleeding, secondary to Effient/Coumadin/ suspected SB AVMs 9. History of aortic stenosis, moderate, December 2013 10. Fever, resolved  11. 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          amiodarone 200 MG tablet     Commonly known as: PACERONE     Take 200 mg by mouth daily.          EFFIENT 10 MG Tabs     Generic drug: prasugrel     Take 10 mg by mouth Daily. 60mg  today, then 10mg  daily          multivitamin Tabs tablet     Take 1 tablet by mouth every morning.          mupirocin  ointment 2 %     Commonly known as: BACTROBAN     Apply 1 application topically 2 (two) times daily.          nitroGLYCERIN 0.4 MG SL tablet     Commonly known as: NITROSTAT     Place 1 tablet (0.4 mg total) under the tongue every 5 (five) minutes as needed.          pantoprazole 40 MG tablet     Commonly known as: PROTONIX     Take 40 mg by mouth 2 (two) times daily.          PARoxetine 20 MG tablet     Commonly known as: PAXIL     Take 1 tablet (20 mg total) by mouth daily.          rosuvastatin 10 MG tablet     Commonly known as: CRESTOR     Take 10 mg by mouth daily.  sevelamer 800 MG tablet     Commonly known as: RENVELA     Take 2,400 mg by mouth 3 (three) times daily with meals.          TAGAMET PO     Take 1 tablet by mouth daily as needed. For acid reflux.            Disposition and follow-up:   Roger [name] was discharged from [facility] in stable condition.  At the hospital follow up visit please address   1) CBC repeat.  Will probably need serial CBC repeats in the future.   2) Follow up blood cultures-negative to date thus far 3) If blood pressure increases decide whether to resume Metoprolol (low dose), ACE inhibitor, Imdur.  Metoprolol should be resumed first.   4) Follow up biopsies from upper endoscopy [facility] 5) Needs outpatient myoview 6) Resume Amiodarone for Atrial Fibrillation if deemed appropriate.   7) consider further work up for anemia (i.e hemolysis, etc.) with peripheral smear in the future  8) Resume Coumadin 1 months post discharge for atrial fibrillation 9) He needs to follow up with his Primary Care Provider 03/21/12, Dr. Leone Payor 04/01/12 and [facility] will call him with an appointment at the [facility] location.    Follow-up Appointments:  Discharge Orders      Future Appointments:  Provider:  Department:  Dept Phone:  Center:     04/01/2012 11:15 AM  Iva Boop, MD   [facility]  [phone]  [facility]        Future Orders  Please Complete By  Expires     Increase activity slowly          Discharge instructions          Comments:     Please follow up with your primary doctor 03/21/12 at 10: 30 am.  [facility] [phone] and [facility] [phone] will call you with an appointment.  If they do no call them to be seen within the next 2-4 weeks.  Thank you. Nice to participate in your care.   Take Effient. Stop Aspirin Stop Metoprolol, ACE inhibitor, Imdur for now until further instructed   If you notice black tarry or bright red stool notify your doctors        Consultations:   1. Internal Medicine-Dr. Eben Burow 2. Beverley Fiedler, MD, Dr. Leone Payor (performed upper endoscopy), Dr. Loreta Ave, Jennye Moccasin PA 3. Nephrology Cecille Aver, MD), Dr. Arlean Hopping 4. Dr. Herbie Baltimore [facility]), Dr Royann Shivers, Diona Fanti (PA), Dr. Rennis Golden  Procedures Performed: echo, Upper endoscopy (Dr. Leone Payor)    Dg Chest [facility] 1 View  03/13/2012  *RADIOLOGY REPORT*  Clinical Data: Cough and fever.  PORTABLE CHEST - 1 VIEW  Comparison: 10/14/2011  Findings: Stable heart size and aortic tortuosity.  No evidence of pulmonary infiltrate, edema or pleural fluid.  IMPRESSION: No active disease.   Original Report Authenticated By: Irish Lack, M.D.      2D Echo: 03/15/12   Study Conclusions  - Left ventricle: The cavity size was normal. Wall thickness was increased in a pattern of mild LVH. Systolic function was mildly reduced. The estimated ejection fraction was in the range of 45% to 50%. Severe hypokinesis of the entireinferolateral myocardium. Moderate hypokinesis of the anterolateral myocardium. Mild hypokinesis of the distalanteroseptal myocardium. Features are consistent with a pseudonormal left ventricular filling pattern, with concomitant abnormal relaxation and increased filling pressure (grade 2  diastolic dysfunction). Doppler  parameters are consistent with elevated mean left atrial filling pressure. - Aortic valve: Valve mobility was moderately restricted. There was moderate stenosis. Trivial regurgitation. Valve area: 1.28cm^2(VTI). Valve area: 1.34cm^2 (Vmax). - Mitral valve: Calcified annulus. Mild regurgitation. Valve area by continuity equation (using LVOT flow): 2.39cm^2. - Pulmonary arteries: Systolic pressure was moderately to severely increased. PA peak pressure: 64mm Hg (S). Transthoracic echocardiography. M-mode, complete 2D, spectral Doppler, and color Doppler. Height: Height: 172.7cm. Height: 68in. Weight: Weight: 79.5kg. Weight: 175lb. Body mass index: BMI: 26.7kg/m^2. Body surface area: BSA: 1.55m^2. Blood pressure: 86/46. Patient status: Inpatient. Location: ICU/CCU     Admission HPI:   Patient is a 71 year old male with past medical history of coronary artery disease status post stenting, atrial fibrillation anticoagulated on warfarin, gout, end-stage renal disease from hypertensive nephropathy on dialysis, and GERD presenting with a couple weeks complaint of chest discomfort as well as one week of dizziness and generalized weakness. Patient presented to the ED from dialysis due to reports of chest pain and measured systolic pressure of 70. Patient reported to the ED provided he was having exertional chest pain, but upon our questioning he denied any exertional component. He reports a couple weeks of mild chest discomfort that he felt to not warrant nitroglycerin therapy at home. It does not feel like his prior MIs. He says it begins over his left-sided chest and radiates across the right and is occasionally sharp. He does not noticed a pattern of onset, and this it occurs at rest. He denies any palpitations, radiation to the neck, radiation to the left arm. He most recently had heart catheterization with stenting to the LAD and balloon angioplasty of a diagonal branch  in June of 2013. Prior to that, he is bare-metal stent placed in the circumflex in 2011 after STEMI, with repeat drug-eluting stent to the circumflex as well as OM branch in May 2013 after NSTEMI. Also concerning at dialysis was a hemoglobin of 7.7, down from his baseline of around 10. He's anticoagulated on warfarin for atrial fibrillation and effient in setting of coronary artery stenting. Upon further questioning, patient endorses dark, tarry stools for the past week. He says he has never had dark stools in the past although he has had positive fecal occult blood testing. He does report worsening dizziness and weakness over the past week at home, but no shortness of breath. He denies hematemesis, abdominal pain, BRBPR. Has had some nausea. Most recently, he had EGD in December 2012 which demonstrated small hiatal hernia and nonbleeding peripyloric ulcers. He was also noted to have a Schatzki ring that was patent at the time. 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 Nuclear tagged red blood cell scan was negative. Limited colonoscopy in January 2011 showed only internal hemorrhoids.   Patient has had bleeding complications from supratherapeutic anticoagulation the past, but never from the GI tract. In January 2013, he required blood transfusion for profuse bleeding from AV fistula site with a noted INR of 7 at the time. He reports that he is taking 2.5 mg of warfarin currently.   Of note, patient reports worsening solid food dysphagia over the past 3 months. Was told by prior provider at that Schatzki ring was responsible for symptoms of dysphagia, but was widely patent at the time of endoscopy.   Admission Physical Exam:   Blood pressure 87/39, pulse 66, temperature 98.9 F (37.2 C), temperature source Oral, resp. rate 22, SpO2 100.00%.   Vitals reviewed.   General: resting in bed,  NAD   HEENT: pale conjunctivae, PERRL, EOMI, no scleral icterus   Cardiac: regular rhythm with HR in 50s,  harsh 2/6 systolic murmur at RUSB and 3/6 systolic murmur at apex and radiating to L axilla   Pulm: clear to auscultation bilaterally, no wheezes, rales, or rhonchi   Abd: soft, nontender, nondistended, BS present   Rectum: External hemorrhoid, nonthrombosed and non bleeding. No blood in anal vault.   Ext: L UE AVF access w bruit and thrill, without evidence of infection or bleeding. Legs warm and well perfused, no pedal edema.   Neuro: alert and oriented X3, cranial nerves II-XII grossly intact, strength and sensation to light touch equal in bilateral upper and lower extremities   Hospital Course by problem list: 1.  *Acute blood loss anemia with suspected GI bleeding on Effient/Coumadin  He presented with a one week history of melena, weakness, dizziness, hypotension and intermittent chest discomfort after dialysis on 03/12/12 and FOBT positive stools.  Hemoglobin was 7.6 on admission.  He did have black tarry stools this admission. He was placed on Protonix 40 mg intravenous bid then transitioned to oral Protonix.  We transfused a total of 4 units pRBCs though his hemoglobin would still drop after transfusion this admission and there still may be a source of bleeding though not identified.  His hemoglobin on day of discharge after the last unit of blood was 8.9.  He has a history of small bowel arteriovenous malformations (AVMs) and pre-pyloric ulcerations in the past (2011).  His INR was 2.46 this admission and he was given one dose of vitamin K.  He also has a history of GI bleed in the past with negative nuclear scan in 2011.  Upper endoscopy this admission showed 5 cm hiatal hernia, patent Schatzki's ring at gastroesophageal junction, no evidence of bleeding.  He had a colonoscopy this admission which did not identify a source of bleeding but showed moderate internal hemorrhoids.  We held Coumadin, Effient this admission but resumed Effient upon discharge.  Goal hemoglobin with cardiac history of  probably at least 10.0.     2. NSTEMI (non-ST elevated myocardial infarction) - Type II MI due to Anemia with existing CAD We trended his cardiac enzymes which elevated 0.54 to 2.52 then back down to 0.77.  He did have some ST depressions in V2 to V6 as well which improved by discharge.  He was asymptomatic without chest pain.  Cardiology was consulted and recommended an outpatient myoview.     3. Systolic congestive heart failure with reduced left ventricular function (45-50% echo 03/2012), NYHA class 1 and history of diastolic dysfunction 03/2012 echo with mild decrease in systolic dysfunction with EF 45-50%, mild LVH, severe hypokinesis inferiorlaterally and mild hypokinesis anteriorlaterally. Grade 2 diastolic dysfunction. Previous echo 08/2011 with EF 50-55% and moderate inferior hypokinesis and grade 2 diastolic dysfunction.  He will need to have a myoview outpatient and follow up with cardiology.  His beta blocker was not resumed this admission due to hypotension but will need to be resumed.  He will also need his ACE inhibitor resumed at some point.  He is on a statin and nitroglycerin prn.     4. Esophageal dysphagia  He had an upper endoscopy which showed Schatzki's ring as before which was not dilated at this time due to him being on Effient and Coumadin.     5. History of CAD Extensive coronary artery disease history.  He has previous DES stenting mid LAD and balloon angioplasty  of diagonal branch 09/2011. History of NSTEMI CFX BMS 12/11 with repeat overlapping DES to left cicumflex (as well as OM) May 2013 (08/22/2011) after NSTEMI.  This was done by Dr. Herbie [place].  We held his Imdur this admission which will need to be resumed in the future.    6. Hypotension with history of (hypertension) Hypotension was probably secondary to acute blood loss.  We held his Metoprolol, Imdur, Amiodarone, ACE inhibitor this admission.  Once blood pressure trends up he will need to be resumed on beta blocker  first, then ACE inhibitor possibly at lower doses.      7. 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    8. Anticoagulated on warfarin for Atrial fibrillation We held his Coumadin this admission due to acute blood loss anemia.  We also held his Amiodarone this admission due to hypotension but he remained in normal sinus rhythm.  He will need to be restarted on Amiodarone if deemed appropriate.  GI bleeding was thought to be secondary to Effient/Coumadin along with history of small bowel AVMs.  We recommend holding Coumadin x 1 month post discharge.  The risks of stroke were explained to the patient but the risk of bleeding was more significant. Consider Plavix depending on P2Y12 levels or Brillinta.  He was started on Aspirin 81 mg this admission when we were holding Coumadin.  Aspirin was increased to 162 mg daily but Aspirin was discontinued by discharge.        9. History of aortic stenosis, moderate, December 2013  10. Fever, resolved  He had fevers this admission T max 102.5.  We thought that his fever may be related to transfusion reaction as his blood has warm antibodies and the blood bank needed to be called for particular pRBCs which was not exactly a match but as close as it could to a match.  He also developed shortness of breath after a particular transfusion and was placed on oxygen and his shortness of breath resolved.   11. 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   Discharge Labs:   Results for CHANLER, MENDONCA (MRN [id number]) as of 03/17/2012 17:00   Ref. Range  03/13/2012 13:00  03/13/2012 18:48  03/14/2012 09:27  03/14/2012 18:34  03/15/2012 04:48  03/15/2012 11:47  03/16/2012 05:45  03/17/2012 04:52   Sodium  Latest Range: 137-147 mmol/L      139    135  138  135  140   Potassium  Latest Range: 3.5-5.1 mEq/L      4.7    5.0  4.0  3.8  4.3   Chloride  Latest Range: 96-112 mEq/L      93 (L)    91 (L)  97  94 (L)  100   CO2  Latest Range: 19-32 mEq/L      32    27  30  28  26    BUN  Latest Range: 6-23 mg/dL      59 (H)    75 (H)  21  29 (H)  38 (H)   Creatinine  Latest Range: 0.50-1.35 mg/dL      4.78 (H)    29.56 (H)  4.17 (H)  6.26 (H)  8.40 (H)   Calcium  Latest Range: 8.4-10.5 mg/dL      9.4    9.1  8.9  9.7  8.6   GFR calc non Af Amer  Latest Range: >90 mL/min      5 (L)    4 (L)  13 (L)  8 (L)  6 (L)   GFR calc Af Amer  Latest Range: >90 mL/min      6 (L)    5 (L)  15 (L)  9 (L)  7 (L)   Glucose  Latest Range: 70-99 mg/dL      99    213 (H)  89  109 (H)  117 (H)   Phosphorus  Latest Range: 2.3-4.6 mg/dL            2.6       Magnesium  Latest Range: 1.5-2.5 mg/dL      2.1             Albumin  No range found            3.1 (L)       Troponin I  Latest Range: <0.30 ng/mL  2.52 (HH)  2.14 (HH)  2.13 (HH)  1.45 (HH)  0.88 (HH)  0.73 (HH)        Results for TATEHenrry, Feil (MRN [id number]) as of 03/17/2012 17:00   Ref. Range  03/12/2012 12:31  03/12/2012 15:58  03/12/2012 20:31  03/13/2012 02:40   Sodium  Latest Range: 137-147 mmol/L  142         Potassium  Latest Range: 3.5-5.1 mEq/L  3.1 (L)         Chloride  Latest Range: 96-112 mEq/L  92 (L)         CO2  Latest Range: 19-32 mEq/L  38 (H)         BUN  Latest Range: 6-23 mg/dL  15         Creatinine  Latest Range: 0.50-1.35 mg/dL  6.29 (H)         Calcium  Latest Range: 8.4-10.5 mg/dL  9.5         GFR calc non Af Amer  Latest Range: >90 mL/min  14 (L)         GFR calc Af Amer  Latest Range: >90  mL/min  16 (L)         Glucose  Latest Range: 70-99 mg/dL  528 (H)         Troponin I  Latest Range: <0.30 ng/mL    <0.30  <0.30  0.54 (HH)    Results for TATEKingstyn, Deruiter (MRN [id number]) as of 03/17/2012 17:00   Ref. Range  03/14/2012 20:19  03/15/2012 11:47  03/15/2012 18:43  03/16/2012 05:45  03/16/2012 18:00  03/17/2012 07:40  03/17/2012 10:08   WBC  Latest Range: 4.0-10.5 K/uL  4.6  4.7  4.2  4.3  5.6  4.5  4.7   RBC  Latest Range: 4.22-5.81 MIL/uL  2.06 (L)  2.34 (L)  2.13 (L)  2.42 (L)  2.47 (L)  2.19 (L)  2.11 (L)   Hemoglobin  Latest Range:  13.0-17.0 g/dL  7.5 (L)  8.2 (L)  7.4 (L)  8.6 (L)  8.5 (L)  7.5 (L)  7.3 (L)   HCT  Latest Range: 39.0-52.0 %  22.3 (L)  24.5 (L)  22.4 (L)  26.0 (L)  26.4 (L)  23.2 (L)  22.2 (L)   MCV  Latest Range: 78.0-100.0 fL  108.3 (H)  104.7 (H)  105.2 (H)  107.4 (H)  106.9 (H)  105.9 (H)  105.2 (H)   MCH  Latest Range: 26.0-34.0 pg  36.4 (H)  35.0 (H)  34.7 (H)  35.5 (H)  34.4 (H)  34.2 (H)  34.6 (H)   MCHC  Latest Range: 30.0-36.0 g/dL  96.0  45.4  09.8  11.9  32.2  32.3  32.9   RDW  Latest Range: 11.5-15.5 %  26.3 (H)  26.7 (H)  26.8 (H)  26.9 (H)  25.9 (H)  25.1 (H)  24.9 (H)   Platelets  Latest Range: 150-400 K/uL  167  180  150  177  206  173  172   Neutrophils Relative  Latest Range: 43-77 %    65  58  53  55       Lymphocytes Relative  Latest Range: 12-46 %    21  20  28  27        Monocytes Relative  Latest Range: 3-12 %    8  14 (H)  11  11       Eosinophils Relative  Latest Range: 0-5 %    6 (H)  7 (H)  7 (H)  6 (H)       Basophils Relative  Latest Range: 0-1 %    0  1  1  1        NEUT#  Latest Range: 1.7-7.7 K/uL    3.0  2.5  2.3  3.1       Lymphocytes Absolute  Latest Range: 0.7-4.0 K/uL    1.0  0.8  1.2  1.5       Monocytes Absolute  Latest Range: 0.1-1.0 K/uL    0.4  0.6  0.5  0.6       Eosinophils Absolute  Latest Range: 0.0-0.7 K/uL    0.3  0.3  0.3  0.3       Basophils Absolute  Latest Range: 0.0-0.1 K/uL    0.0  0.0  0.0  0.1       RBC Morphology   No range found    POLYCHROMASIA PRESENT  POLYCHROMASIA PRESENT  POLYCHROMASIA PRESENT  POLYCHROMASIA PRESENT        Results for DAMIN, SALIDO (MRN [id number]) as of 03/17/2012 17:00   Ref. Range  03/12/2012 12:31  03/12/2012 14:36  03/12/2012 15:58  03/12/2012 20:25  03/12/2012 22:50  03/13/2012 02:40  03/13/2012 12:18  03/13/2012 18:40  03/14/2012 09:27   WBC  Latest Range: 4.0-10.5 K/uL  5.8  5.8  5.1  5.3  5.3  4.5  4.6  4.2  4.4   RBC  Latest Range: 4.22-5.81 MIL/uL  1.94 (L)  1.88 (L)  1.80 (L)  1.63 (L)  1.60 (L)  1.47 (L)  2.35 (L)  2.16 (L)  2.02 (L)   Hemoglobin  Latest Range: 13.0-17.0 g/dL  7.6 (L)  7.6 (L)  7.3 (L)  6.4 (LL)  6.2 (LL)  5.9 (LL)  8.2 (L)  7.7 (L)  7.2 (L)   HCT  Latest Range: 39.0-52.0 %  23.4 (L)  22.8 (L)  21.8 (L)  20.0 (L)  19.5 (L)  18.2 (L)  25.0 (L)  23.0 (L)  21.4 (L)   MCV  Latest Range: 78.0-100.0 fL  120.6 (H)  121.3 (H)  121.1 (H)  122.7 (H)  121.9 (H)  123.8 (H)  106.4 (H)  106.5 (H)  105.9 (H)   MCH  Latest Range: 26.0-34.0 pg  39.2 (H)  40.4 (H)  40.6 (H)  39.3 (H)  38.8 (H)  40.1 (H)  34.9 (H)  35.6 (H)  35.6 (H)   MCHC  Latest Range: 30.0-36.0 g/dL  96.0  45.4  09.8  11.9  31.8  32.4  32.8  33.5  33.6   RDW  Latest Range: 11.5-15.5 %  16.4 (H)  16.4 (H)  16.6 (H)  16.9 (H)  16.5 (H)  17.1 (H)  27.0 (H)    26.1 (H)   Platelets  Latest Range: 150-400 K/uL  223  211  208  225  192  185  182  168  167   Neutrophils Relative  Latest Range: 43-77 %  54                   Lymphocytes Relative  Latest Range: 12-46 %  26                   Monocytes Relative  Latest Range: 3-12 %  12                   Eosinophils Relative  Latest Range: 0-5 %  8 (H)                   Basophils Relative  Latest Range: 0-1 %  1                   NEUT#  Latest Range: 1.7-7.7 K/uL  3.1                   Lymphocytes Absolute  Latest Range: 0.7-4.0 K/uL  1.5                   Monocytes Absolute  Latest Range: 0.1-1.0 K/uL  0.7                   Eosinophils Absolute  Latest Range: 0.0-0.7  K/uL  0.5                   Basophils Absolute  Latest Range: 0.0-0.1 K/uL  0.0                   Results for TATEChaska, Hagger (MRN [id number]) as of 03/17/2012 17:00   Ref. Range  03/12/2012 12:14  03/13/2012 02:40  03/13/2012 12:47  03/14/2012 09:27   Prothrombin Time  Latest Range: 11.6-15.2 seconds  25.5 (H)  24.7 (H)  18.9 (H)  16.4 (H)   INR  Latest Range: 0.00-1.49   2.46 (H)  2.35 (H)  1.64 (H)  1.35    Results for TATEArmanii, Pressnell (MRN [id number]) as of 03/17/2012 17:00   Ref. Range  03/12/2012 12:31  03/13/2012 03:20  03/14/2012 09:27  03/15/2012 04:48  03/15/2012 11:47  03/16/2012 05:45  03/17/2012 04:52   Glucose  Latest Range: 70-99 mg/dL  696 (H)  94  99  295 (H)  89  109 (H)  117 (H)   Results for Charrette, TYWAUN HILTNER (MRN [id number]) as of 03/17/2012 17:00   Ref. Range  03/12/2012  17:54   MRSA by PCR-nares  Latest Range: NEGATIVE   POSITIVE (A)    03/12/12 Blood cultures negative to date and pending  Results for Hoey, CHARELS STAMBAUGH (MRN [id number]) as of 03/17/2012 17:00   Ref. Range  03/12/2012 12:02   Fecal Occult Blood, POC  No range found  POSITIVE      Results for orders placed during the hospital encounter of 03/12/12 (from the past 24 hour(s))   CBC     Status: Abnormal     Collection Time     03/17/12  5:59 PM       Component  Value  Range     WBC  4.6   4.0 - 10.5 K/uL     RBC  2.63 (*)  4.22 - 5.81 MIL/uL     Hemoglobin  8.9 (*)  13.0 - 17.0 g/dL     HCT  40.9 (*)  81.1 - 52.0 %     MCV  103.8 (*)  78.0 - 100.0 fL     MCH  33.8   26.0 - 34.0 pg     MCHC  32.6   30.0 - 36.0 g/dL     RDW  91.4 (*)  78.2 - 15.5 %     Platelets  178   150 - 400 K/uL     Signed: Annett Gula 03/17/2012, 5:21 PM   Time Spent on Discharge: <30 minutes   Services Ordered on Discharge: none Equipment Ordered on Discharge: none

## 2012-03-19 LAB — CULTURE, BLOOD (ROUTINE X 2): Culture: NO GROWTH

## 2012-03-31 ENCOUNTER — Telehealth: Payer: Self-pay | Admitting: Internal Medicine

## 2012-03-31 NOTE — Telephone Encounter (Signed)
Spoke with patient's daughter and told her the OV was to discuss the findings and recommendations of hospitalization. She will try to get her dad to come to the appointment.

## 2012-03-31 NOTE — Telephone Encounter (Signed)
Left message for Neysa Bonito to call me.

## 2012-04-01 ENCOUNTER — Telehealth: Payer: Self-pay

## 2012-04-01 ENCOUNTER — Ambulatory Visit (INDEPENDENT_AMBULATORY_CARE_PROVIDER_SITE_OTHER): Payer: Medicare Other | Admitting: Internal Medicine

## 2012-04-01 ENCOUNTER — Encounter: Payer: Self-pay | Admitting: Internal Medicine

## 2012-04-01 VITALS — BP 138/84 | HR 88 | Ht 68.0 in | Wt 174.7 lb

## 2012-04-01 DIAGNOSIS — I2510 Atherosclerotic heart disease of native coronary artery without angina pectoris: Secondary | ICD-10-CM

## 2012-04-01 DIAGNOSIS — I4891 Unspecified atrial fibrillation: Secondary | ICD-10-CM

## 2012-04-01 DIAGNOSIS — Q391 Atresia of esophagus with tracheo-esophageal fistula: Secondary | ICD-10-CM

## 2012-04-01 DIAGNOSIS — D500 Iron deficiency anemia secondary to blood loss (chronic): Secondary | ICD-10-CM

## 2012-04-01 DIAGNOSIS — K5521 Angiodysplasia of colon with hemorrhage: Secondary | ICD-10-CM

## 2012-04-01 DIAGNOSIS — Z9861 Coronary angioplasty status: Secondary | ICD-10-CM

## 2012-04-01 DIAGNOSIS — K222 Esophageal obstruction: Secondary | ICD-10-CM

## 2012-04-01 NOTE — Progress Notes (Signed)
Subjective:    Patient ID: Roger [name], male    DOB: June 07, 1940, 71 y.o.   MRN: [id number]  HPI The patient returns for followup after hospitalization because of a GI bleed. He was on Effient and warfarin and I think aspirin. He has drug-eluting stents with coronary artery disease and other vascular disease as well as atrial fibrillation. He had an upper GI endoscopy and a colonoscopy that did not show the source of bleeding. A lower esophageal ring was disrupted with biopsy forceps. 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  He is better with a hemoglobin above 10 he tells me, having that checked at dialysis. He is not noticing any melena or hematochezia. He is feeling stronger. He has not had dysphagia. He saw Dr. [name] of cardiology and followup recently. He is only on Effient at this time. His daughter is wondering about what other medications he might be able to take to help reduce the risk of stroke as the Effient is for his stented arteries.  He is not on by mouth iron but think she might get iron injections through dialysis  In the past he used to take Tagamet intermittently for heartburn when he eats spicy foods. At some point in the past few months his pantoprazole was changed to 40 mg twice daily. He is not taking extra Tagamet now.  No Known Allergies Outpatient Prescriptions Prior to Visit  Medication Sig Dispense Refill  . allopurinol (ZYLOPRIM) 100 MG tablet Take 50 mg by mouth 2 (two) times daily.       [name] amiodarone (PACERONE) 200 MG tablet Take 200 mg by mouth daily.      . Cimetidine (TAGAMET PO) Take 1 tablet by mouth daily as needed. For acid reflux.      [name] EFFIENT 10 MG TABS Take 10 mg by mouth Daily. 60mg  today, then 10mg  daily      . multivitamin (RENA-VIT) TABS tablet Take 1 tablet by mouth every morning.       . mupirocin ointment (BACTROBAN) 2 % Apply 1 application topically 2 (two)  times daily.  22 g    . nitroGLYCERIN (NITROSTAT) 0.4 MG SL tablet Place 1 tablet (0.4 mg total) under the tongue every 5 (five) minutes as needed.  25 tablet  5  . pantoprazole (PROTONIX) 40 MG tablet Take 40 mg by mouth 2 (two) times daily.       [name] PARoxetine (PAXIL) 20 MG tablet Take 1 tablet (20 mg total) by mouth daily.      . rosuvastatin (CRESTOR) 10 MG tablet Take 10 mg by mouth daily.      . sevelamer (RENVELA) 800 MG tablet Take 2,400 mg by mouth 3 (three) times daily with meals.       Last reviewed on 04/01/2012 12:08 PM by Iva Boop, MD Past Medical History  Diagnosis Date  . HTN (hypertension)   . Arthritis   . Hypercholesteremia   . MI (myocardial infarction)   . GERD (gastroesophageal reflux disease)   . Atrial fibrillation     Paroxysmal - 2 episodes related to ischemic NSTEMI events  5 & 09/2011  . Gout   . S/P coronary artery stent placement, 10/01/11 with DES to mid LAD and  PTCA of Diag 2 10/01/2009    Inferior STEMI 2011 - BMS to LCx; NSTEMI 5/13 with ISR/thrombosis of Cx BMS -->multiple DES to  LCx ; staged PCI of LAD /Balloon PTCA of major Diag 09/2011 for Sx (NSTEMI)   . Anticoagulated on warfarin 10/06/2011  . ESRD on hemodialysis     [place] [facility], TTS, esrd due to HTN  . Heart murmur   . Anemia   . Schatzki's ring   . Angiodysplasia of intestine    Past Surgical History  Procedure Date  . Ureteral stent placement   . Colonoscopy 04/2009    with EGD by Dr.Buccini moderately large hiatal hernia could contribute to his anemia,Schatzki ring which might accout for his intermittent dysphagia/+internal hemorrhoids ;family hx of colon ca  . Esophagogastroduodenoscopy 03/2010    by Dr.Magod/small hiatal hernia with widley patent fibrous ring,minimal bulbitis otherwise nl  . Dialysis fistula creation   . Coronary angioplasty with stent placement 08/2011 & 09/2011    Integrity Resolute DES Stents (4 overlapping in LCx- OM1; 1 in mid LAD; PTCA only of Major Diag  (small caliber)  . Coronary angioplasty with stent placement 2011    Roger [name]  . Givens capsule study 04/16/10    SB erosions vs. AVMs, non-specific distal SB inflammation in setting ASA, plavix, NSAIDs  . Esophagogastroduodenoscopy 03/14/2012    Procedure: ESOPHAGOGASTRODUODENOSCOPY (EGD);  Surgeon: Iva Boop, MD;  Location: [facility] ENDOSCOPY;  Service: Endoscopy;  Laterality: N/A;  . Colonoscopy 03/16/2012    Procedure: COLONOSCOPY;  Surgeon: Iva Boop, MD;  Location: [facility] ENDOSCOPY;  Service: Endoscopy;  Laterality: N/A;    Review of Systems Improving fatigue and energy level    Objective:   Physical Exam Chronically ill no acute distress       Assessment & Plan:   1. Angiodysplasia of intestine with hemorrhage   2. Blood loss anemia   3. CAD S/P percutaneous coronary interventions and drug-eluting stents   4. Atrial fibrillation   5. Esophageal ring    1. I think he is unable to tolerate bowel and tied platelet anticoagulant therapy. It is possible he might tolerate aspirin with Effient and given his significant disease processes it would be reasonable to try that if it was thought that we could reduce his risk of stroke by adding an aspirin. As I explained GI bleeding is usually not a fatal or permanently disabling problem. Strokes and myocardial infarction can be. 2. We'll discontinue Tagamet 3. He should take ferrous sulfate by mouth or get it intravenously through the kidney center if he is he will clarify this 4. Return here as needed - he will observe for recurrent dysphagia, it sounds like disruption of the ring may have helped. A standard esophageal dilation lower quarter holding the Effient.  13 minutes time spent with the patient and his daughter today most of which was in counseling and coordination of care   CC: Roger Reichert, MD, Roger Needle, MD, Roger Batty, MD

## 2012-04-01 NOTE — Telephone Encounter (Signed)
Spoke to [place] his daughter at [phone] and told her I never heard back today from the [facility]ney center [phone]) regarding if he gets iron with dialysis.  He goes tomorrow and Neysa Bonito will tell him to ask them and to get them to call me Monday.

## 2012-04-01 NOTE — Patient Instructions (Addendum)
If you are not getting iron at dialysis you should take Ferrous Sulfate 325mg  each day.  Stop the Tagamet.  Continue taking the pantoprazole  Twice a day.  Follow up with [place] as needed.  Thank you for choosing me and [facility].  Iva Boop, M.D., [facility]    Awaiting call back from [facility] to see if they give him iron.  Once I hear I will contact daughter Lorene Dy at [phone].

## 2012-04-11 ENCOUNTER — Telehealth: Payer: Self-pay

## 2012-04-11 NOTE — Telephone Encounter (Signed)
Spoke with Toniann Fail at the [facility] # [phone] and she reports that Roger [name] is getting Venofer 100mg  three times a week for ten doses.  He started this 04/04/12.  Once finished they will re-evaluate him.

## 2012-07-02 ENCOUNTER — Encounter (HOSPITAL_COMMUNITY): Payer: Self-pay | Admitting: Nurse Practitioner

## 2012-07-02 ENCOUNTER — Inpatient Hospital Stay (HOSPITAL_COMMUNITY)
Admission: EM | Admit: 2012-07-02 | Discharge: 2012-07-08 | DRG: 356 | Disposition: A | Discharge status: Home / Self Care | Payer: Medicare Other | Attending: Internal Medicine | Admitting: Internal Medicine

## 2012-07-02 DIAGNOSIS — K5520 Angiodysplasia of colon without hemorrhage: Secondary | ICD-10-CM

## 2012-07-02 DIAGNOSIS — K219 Gastro-esophageal reflux disease without esophagitis: Secondary | ICD-10-CM

## 2012-07-02 DIAGNOSIS — I5022 Chronic systolic (congestive) heart failure: Secondary | ICD-10-CM | POA: Diagnosis present

## 2012-07-02 DIAGNOSIS — N2581 Secondary hyperparathyroidism of renal origin: Secondary | ICD-10-CM | POA: Diagnosis present

## 2012-07-02 DIAGNOSIS — I209 Angina pectoris, unspecified: Secondary | ICD-10-CM

## 2012-07-02 DIAGNOSIS — R001 Bradycardia, unspecified: Secondary | ICD-10-CM

## 2012-07-02 DIAGNOSIS — Z9861 Coronary angioplasty status: Secondary | ICD-10-CM

## 2012-07-02 DIAGNOSIS — I2589 Other forms of chronic ischemic heart disease: Secondary | ICD-10-CM | POA: Diagnosis present

## 2012-07-02 DIAGNOSIS — I252 Old myocardial infarction: Secondary | ICD-10-CM

## 2012-07-02 DIAGNOSIS — N390 Urinary tract infection, site not specified: Secondary | ICD-10-CM | POA: Diagnosis present

## 2012-07-02 DIAGNOSIS — R1319 Other dysphagia: Secondary | ICD-10-CM

## 2012-07-02 DIAGNOSIS — I255 Ischemic cardiomyopathy: Secondary | ICD-10-CM

## 2012-07-02 DIAGNOSIS — I10 Essential (primary) hypertension: Secondary | ICD-10-CM | POA: Diagnosis present

## 2012-07-02 DIAGNOSIS — K922 Gastrointestinal hemorrhage, unspecified: Secondary | ICD-10-CM

## 2012-07-02 DIAGNOSIS — I959 Hypotension, unspecified: Secondary | ICD-10-CM

## 2012-07-02 DIAGNOSIS — K5521 Angiodysplasia of colon with hemorrhage: Principal | ICD-10-CM

## 2012-07-02 DIAGNOSIS — Z8679 Personal history of other diseases of the circulatory system: Secondary | ICD-10-CM

## 2012-07-02 DIAGNOSIS — Z7902 Long term (current) use of antithrombotics/antiplatelets: Secondary | ICD-10-CM

## 2012-07-02 DIAGNOSIS — D649 Anemia, unspecified: Secondary | ICD-10-CM

## 2012-07-02 DIAGNOSIS — Z79899 Other long term (current) drug therapy: Secondary | ICD-10-CM

## 2012-07-02 DIAGNOSIS — R58 Hemorrhage, not elsewhere classified: Secondary | ICD-10-CM

## 2012-07-02 DIAGNOSIS — R031 Nonspecific low blood-pressure reading: Secondary | ICD-10-CM | POA: Diagnosis present

## 2012-07-02 DIAGNOSIS — I2510 Atherosclerotic heart disease of native coronary artery without angina pectoris: Secondary | ICD-10-CM

## 2012-07-02 DIAGNOSIS — Z22322 Carrier or suspected carrier of Methicillin resistant Staphylococcus aureus: Secondary | ICD-10-CM

## 2012-07-02 DIAGNOSIS — N186 End stage renal disease: Secondary | ICD-10-CM

## 2012-07-02 DIAGNOSIS — Z955 Presence of coronary angioplasty implant and graft: Secondary | ICD-10-CM

## 2012-07-02 DIAGNOSIS — I480 Paroxysmal atrial fibrillation: Secondary | ICD-10-CM

## 2012-07-02 DIAGNOSIS — K222 Esophageal obstruction: Secondary | ICD-10-CM

## 2012-07-02 DIAGNOSIS — I120 Hypertensive chronic kidney disease with stage 5 chronic kidney disease or end stage renal disease: Secondary | ICD-10-CM | POA: Diagnosis present

## 2012-07-02 DIAGNOSIS — D631 Anemia in chronic kidney disease: Secondary | ICD-10-CM

## 2012-07-02 DIAGNOSIS — M129 Arthropathy, unspecified: Secondary | ICD-10-CM | POA: Diagnosis present

## 2012-07-02 DIAGNOSIS — I359 Nonrheumatic aortic valve disorder, unspecified: Secondary | ICD-10-CM | POA: Diagnosis present

## 2012-07-02 DIAGNOSIS — Z7901 Long term (current) use of anticoagulants: Secondary | ICD-10-CM

## 2012-07-02 DIAGNOSIS — M109 Gout, unspecified: Secondary | ICD-10-CM | POA: Diagnosis present

## 2012-07-02 DIAGNOSIS — E785 Hyperlipidemia, unspecified: Secondary | ICD-10-CM

## 2012-07-02 DIAGNOSIS — D62 Acute posthemorrhagic anemia: Secondary | ICD-10-CM

## 2012-07-02 DIAGNOSIS — Z87891 Personal history of nicotine dependence: Secondary | ICD-10-CM

## 2012-07-02 DIAGNOSIS — I739 Peripheral vascular disease, unspecified: Secondary | ICD-10-CM

## 2012-07-02 DIAGNOSIS — Z992 Dependence on renal dialysis: Secondary | ICD-10-CM

## 2012-07-02 DIAGNOSIS — M1990 Unspecified osteoarthritis, unspecified site: Secondary | ICD-10-CM

## 2012-07-02 DIAGNOSIS — I509 Heart failure, unspecified: Secondary | ICD-10-CM | POA: Diagnosis present

## 2012-07-02 DIAGNOSIS — I214 Non-ST elevation (NSTEMI) myocardial infarction: Secondary | ICD-10-CM

## 2012-07-02 DIAGNOSIS — R195 Other fecal abnormalities: Secondary | ICD-10-CM

## 2012-07-02 DIAGNOSIS — I5020 Unspecified systolic (congestive) heart failure: Secondary | ICD-10-CM

## 2012-07-02 DIAGNOSIS — I350 Nonrheumatic aortic (valve) stenosis: Secondary | ICD-10-CM

## 2012-07-02 DIAGNOSIS — I4891 Unspecified atrial fibrillation: Secondary | ICD-10-CM | POA: Diagnosis present

## 2012-07-02 LAB — CBC WITH DIFFERENTIAL/PLATELET
Basophils Relative: 1 % (ref 0–1)
Eosinophils Absolute: 0.3 10*3/uL (ref 0.0–0.7)
Eosinophils Relative: 7 % — ABNORMAL HIGH (ref 0–5)
Lymphs Abs: 1.4 10*3/uL (ref 0.7–4.0)
MCH: 37.4 pg — ABNORMAL HIGH (ref 26.0–34.0)
MCHC: 32.9 g/dL (ref 30.0–36.0)
MCV: 113.6 fL — ABNORMAL HIGH (ref 78.0–100.0)
Neutrophils Relative %: 52 % (ref 43–77)
Platelets: 218 10*3/uL (ref 150–400)
RBC: 2.06 MIL/uL — ABNORMAL LOW (ref 4.22–5.81)

## 2012-07-02 LAB — POCT I-STAT, CHEM 8
Chloride: 95 mEq/L — ABNORMAL LOW (ref 96–112)
Creatinine, Ser: 3.1 mg/dL — ABNORMAL HIGH (ref 0.50–1.35)
Glucose, Bld: 123 mg/dL — ABNORMAL HIGH (ref 70–99)
Potassium: 3.6 mEq/L (ref 3.5–5.1)

## 2012-07-02 LAB — PREPARE RBC (CROSSMATCH)

## 2012-07-02 MED ORDER — ALBUTEROL SULFATE (5 MG/ML) 0.5% IN NEBU: 2.5000 mg | INHALATION_SOLUTION | RESPIRATORY_TRACT | Status: DC | PRN

## 2012-07-02 MED ORDER — ACETAMINOPHEN 325 MG PO TABS
650.0000 mg | ORAL_TABLET | Freq: Four times a day (QID) | ORAL | Status: DC | PRN
  Administered 2012-07-03: 650 mg via ORAL
  Filled 2012-07-02 (×3): qty 2

## 2012-07-02 MED ORDER — ONDANSETRON HCL 4 MG PO TABS: 4.0000 mg | ORAL_TABLET | Freq: Four times a day (QID) | ORAL | Status: DC | PRN

## 2012-07-02 MED ORDER — ALLOPURINOL 100 MG PO TABS
50.0000 mg | ORAL_TABLET | Freq: Two times a day (BID) | ORAL | Status: DC
  Administered 2012-07-02 – 2012-07-08 (×8): 50 mg via ORAL
  Filled 2012-07-02 (×14): qty 0.5

## 2012-07-02 MED ORDER — AMIODARONE HCL 200 MG PO TABS
200.0000 mg | ORAL_TABLET | Freq: Every day | ORAL | Status: DC
  Administered 2012-07-07: 200 mg via ORAL
  Filled 2012-07-02 (×7): qty 1

## 2012-07-02 MED ORDER — METOPROLOL TARTRATE 25 MG PO TABS
25.0000 mg | ORAL_TABLET | Freq: Two times a day (BID) | ORAL | Status: DC
  Filled 2012-07-02 (×3): qty 1

## 2012-07-02 MED ORDER — ATORVASTATIN CALCIUM 20 MG PO TABS
20.0000 mg | ORAL_TABLET | Freq: Every day | ORAL | Status: DC
  Administered 2012-07-02 – 2012-07-07 (×6): 20 mg via ORAL
  Filled 2012-07-02 (×7): qty 1

## 2012-07-02 MED ORDER — ONDANSETRON HCL 4 MG/2ML IJ SOLN: 4.0000 mg | Freq: Four times a day (QID) | INTRAMUSCULAR | Status: DC | PRN

## 2012-07-02 MED ORDER — PAROXETINE HCL 20 MG PO TABS
20.0000 mg | ORAL_TABLET | Freq: Every day | ORAL | Status: DC
  Administered 2012-07-03 – 2012-07-08 (×6): 20 mg via ORAL
  Filled 2012-07-02 (×6): qty 1

## 2012-07-02 MED ORDER — PANTOPRAZOLE SODIUM 40 MG IV SOLR
40.0000 mg | Freq: Two times a day (BID) | INTRAVENOUS | Status: DC
  Administered 2012-07-02 – 2012-07-03 (×4): 40 mg via INTRAVENOUS
  Filled 2012-07-02 (×6): qty 40

## 2012-07-02 MED ORDER — ACETAMINOPHEN 650 MG RE SUPP: 650.0000 mg | Freq: Four times a day (QID) | RECTAL | Status: DC | PRN

## 2012-07-02 MED ORDER — SEVELAMER CARBONATE 800 MG PO TABS
2400.0000 mg | ORAL_TABLET | Freq: Three times a day (TID) | ORAL | Status: DC
  Administered 2012-07-02 – 2012-07-03 (×3): 1600 mg via ORAL
  Administered 2012-07-04: 2400 mg via ORAL
  Administered 2012-07-04: 1600 mg via ORAL
  Administered 2012-07-04 – 2012-07-08 (×4): 2400 mg via ORAL
  Filled 2012-07-02 (×20): qty 3

## 2012-07-02 MED ORDER — RENA-VITE PO TABS
1.0000 | ORAL_TABLET | ORAL | Status: DC
  Administered 2012-07-04 – 2012-07-06 (×3): 1 via ORAL
  Filled 2012-07-02 (×7): qty 1

## 2012-07-02 MED ORDER — SODIUM CHLORIDE 0.9 % IJ SOLN
3.0000 mL | Freq: Two times a day (BID) | INTRAMUSCULAR | Status: DC
  Administered 2012-07-02 – 2012-07-08 (×12): 3 mL via INTRAVENOUS

## 2012-07-02 MED ORDER — CHLORHEXIDINE GLUCONATE CLOTH 2 % EX PADS
6.0000 | MEDICATED_PAD | Freq: Every day | CUTANEOUS | Status: AC
  Administered 2012-07-03 – 2012-07-07 (×5): 6 via TOPICAL

## 2012-07-02 MED ORDER — ISOSORBIDE MONONITRATE ER 30 MG PO TB24
30.0000 mg | ORAL_TABLET | Freq: Every day | ORAL | Status: DC
  Administered 2012-07-02: 30 mg via ORAL
  Filled 2012-07-02 (×2): qty 1

## 2012-07-02 MED ORDER — MUPIROCIN 2 % EX OINT
1.0000 "application " | TOPICAL_OINTMENT | Freq: Two times a day (BID) | CUTANEOUS | Status: AC
  Administered 2012-07-02 – 2012-07-07 (×10): 1 via NASAL
  Filled 2012-07-02: qty 22

## 2012-07-02 MED ORDER — NITROGLYCERIN 0.4 MG SL SUBL: 0.4000 mg | SUBLINGUAL_TABLET | SUBLINGUAL | Status: DC | PRN

## 2012-07-02 MED ORDER — SODIUM CHLORIDE 0.9 % IV BOLUS (SEPSIS)
500.0000 mL | Freq: Once | INTRAVENOUS | Status: AC
  Administered 2012-07-02: 500 mL via INTRAVENOUS

## 2012-07-02 NOTE — ED Provider Notes (Signed)
History     CSN: [id number]  Arrival date & time 07/02/12  1155   None     Chief Complaint  Patient presents with  . Anemia    (Consider location/radiation/quality/duration/timing/severity/associated sxs/prior treatment) HPI Complains of generalized weakness onset 2 weeks ago and black bowel movements for approximately 2 weeks. He was sent here from the dialysis center today stating that he was told to report to the emergency department for hemoglobin of 7.2. Patient states weakness is worse with walking or exerting himself improved with remaining still. He is presently asymptomatic. No other complaint. No shortness of breath. Admits to diminished appetite for the past few weeks No other associated symptoms. Past Medical History  Diagnosis Date  . HTN (hypertension)   . Arthritis   . Hypercholesteremia   . MI (myocardial infarction) 03/16/2011    inferolateral ST segment elevation MI  . GERD (gastroesophageal reflux disease)   . Atrial fibrillation     Paroxysmal - 2 episodes related to ischemic NSTEMI events  5 & 09/2011  . Gout   . S/P coronary artery stent placement, 10/01/11 with DES to mid LAD and  PTCA of Diag 2 10/01/2009    Inferior STEMI 2011 - BMS to LCx; NSTEMI 5/13 with ISR/thrombosis of Cx BMS -->multiple DES to  LCx ; staged PCI of LAD /Balloon PTCA of major Diag 09/2011 for Sx (NSTEMI)   . Anticoagulated on warfarin 10/06/2011  . ESRD on hemodialysis     [place] [facility], TTS, esrd due to HTN  . Heart murmur   . Anemia   . Schatzki's ring   . Angiodysplasia of intestine   . Aortic stenosis, moderate     2D Echocardiogram 12/13 and 08/22/11 showed moderate aortic stenosis with valve area of 1.28 sq cm. EF of 45 to 50% with severe hypokinesia of the entire inferolateral myocardium  . Right renal artery stenosis 1998    Past Surgical History  Procedure Laterality Date  . Ureteral stent placement    . Colonoscopy  04/2009    with EGD by Dr.Buccini moderately large hiatal  hernia could contribute to his anemia,Schatzki ring which might accout for his intermittent dysphagia/+internal hemorrhoids ;family hx of colon ca  . Esophagogastroduodenoscopy  03/2010    by Dr.Magod/small hiatal hernia with widley patent fibrous ring,minimal bulbitis otherwise nl  . Dialysis fistula creation    . Coronary angioplasty with stent placement  08/2011 & 09/2011    Integrity Resolute DES Stents  Stent #1:  2.10mm x 14 mm (extending to just distal to the bifurcation to the mid OM, Stent #2:  2.61mm x 18mm  Stent #3:  3.83mm x 18mm; covering proximal portion of the old stent into the proximal circumflex and overlapping stent #2 distally, Stent #4: 2.44mm x 8mm (4 overlapping in LCx- OM1; 1 in mid LAD; PTCA only of Major Diag (small caliber)  . Coronary angioplasty with stent placement  2011, 2012    Dr [name]  03/16/11  re-cannulized totally occluded circumflex, stented with Integrity bare metal stent focal 90% stenosis in LAD and diagonal branch   . Givens capsule study  04/16/10    SB erosions vs. AVMs, non-specific distal SB inflammation in setting ASA, plavix, NSAIDs  . Esophagogastroduodenoscopy  03/14/2012    Procedure: ESOPHAGOGASTRODUODENOSCOPY (EGD);  Surgeon: Iva Boop, MD;  Location: [facility] ENDOSCOPY;  Service: Endoscopy;  Laterality: N/A;  . Colonoscopy  03/16/2012    Procedure: COLONOSCOPY;  Surgeon: Iva Boop, MD;  Location: [facility]  ENDOSCOPY;  Service: Endoscopy;  Laterality: N/A;  . Right renal artery, pta and stenting  08/22/1996    Dr [name], P-154 Palmaz stent mounted on a 6mm x 2cm PowerFlex and deployed in the renal ostium at 7 atmospheres  reduction of 95% lesion to 0% residual without dissection      Family History  Problem Relation Age of Onset  . Coronary artery disease Father   . Hypertension Mother   . Coronary artery disease Mother   . Colon cancer Brother 46    History  Substance Use Topics  . Smoking status: Former Smoker -- 0.50 packs/day  for 45 years    Types: Cigarettes    Quit date: 04/13/1992  . Smokeless tobacco: Current User    Types: Chew  . Alcohol Use: No      Review of Systems  Constitutional: Positive for appetite change.  Gastrointestinal:       Black stools  Neurological: Positive for weakness.       Generalized weak    Allergies  Review of patient's allergies indicates no known allergies.  Home Medications   Current Outpatient Rx  Name  Route  Sig  Dispense  Refill  . allopurinol (ZYLOPRIM) 100 MG tablet   Oral   Take 50 mg by mouth 2 (two) times daily.          [name] amiodarone (PACERONE) 200 MG tablet   Oral   Take 200 mg by mouth daily.         . Cimetidine (TAGAMET PO)   Oral   Take 1 tablet by mouth daily as needed. For acid reflux.         [name] EFFIENT 10 MG TABS   Oral   Take 10 mg by mouth Daily. 60mg  today, then 10mg  daily         . multivitamin (RENA-VIT) TABS tablet   Oral   Take 1 tablet by mouth every morning.          . mupirocin ointment (BACTROBAN) 2 %   Topical   Apply 1 application topically 2 (two) times daily.   22 g      . nitroGLYCERIN (NITROSTAT) 0.4 MG SL tablet   Sublingual   Place 1 tablet (0.4 mg total) under the tongue every 5 (five) minutes as needed.   25 tablet   5   . pantoprazole (PROTONIX) 40 MG tablet   Oral   Take 40 mg by mouth 2 (two) times daily.          [name] PARoxetine (PAXIL) 20 MG tablet   Oral   Take 1 tablet (20 mg total) by mouth daily.         . rosuvastatin (CRESTOR) 10 MG tablet   Oral   Take 10 mg by mouth daily.         . sevelamer (RENVELA) 800 MG tablet   Oral   Take 2,400 mg by mouth 3 (three) times daily with meals.           BP 96/41  Pulse 65  Temp(Src) 98.3 F (36.8 C) (Oral)  Resp 16  SpO2 95%  Physical Exam  Nursing note and vitals reviewed. Constitutional: He appears well-developed and well-nourished.  HENT:  Head: Normocephalic and atraumatic.  Eyes: Conjunctivae are normal. Pupils  are equal, round, and reactive to light.  Neck: Neck supple. No tracheal deviation present. No thyromegaly present.  Cardiovascular: Normal rate and regular rhythm.   Murmur heard. 3/6 systolic  murmur left sternal border  Pulmonary/Chest: Effort normal and breath sounds normal.  Abdominal: Soft. Bowel sounds are normal. He exhibits no distension. There is no tenderness.  Genitourinary: Guaiac positive stool.  Rectum Small amount of stool on examining finger., Black  Musculoskeletal: Normal range of motion. He exhibits no edema and no tenderness.  Neurological: He is alert. Coordination normal.  Skin: Skin is warm and dry. No rash noted.  Psychiatric: He has a normal mood and affect.    ED Course  Procedures (including critical care time)  Labs Reviewed - No data to display No results found.   No diagnosis found.    Date: 07/02/2012  Rate: 60  Rhythm: sinus bradycardia  QRS Axis: normal  Intervals: normal  ST/T Wave abnormalities: Diffuse T-wave inversions  Conduction Disutrbances:none  Narrative Interpretation:   Old EKG Reviewed: No significant change from 03/12/2012 interpreted by me Patient received 500 mL intravenous saline bolus. At 1 PM he is resting comfortably. Offers no complaint about however remains mildly hypotensive MDM  Patient is not exhibiting signs of shock however has the potential for decompensation given borderline hypotension and subacute gastrointestinal bleeding Spoke with Dr. Waymon Amato, plan admit step down unit due to hypertension, and anemia. Type and screen. I also consult to Dr.Pyrtle suggests Protonix 40 mg twice a day, ordered by me Diagnosis #1 GI bleed #2 anemia #3 hypotension  CRITICAL CARE Performed by: Doug Sou   Total critical care time: 30 minutes  Critical care time was exclusive of separately billable procedures and treating other patients.  Critical care was necessary to treat or prevent imminent or life-threatening  deterioration.  Critical care was time spent personally by me on the following activities: development of treatment plan with patient and/or surrogate as well as nursing, discussions with consultants, evaluation of patient's response to treatment, examination of patient, obtaining history from patient or surrogate, ordering and performing treatments and interventions, ordering and review of laboratory studies, ordering and review of radiographic studies, pulse oximetry and re-evaluation of patient's condition.      Doug Sou, MD 07/02/12 [id number]

## 2012-07-02 NOTE — Progress Notes (Signed)
Notified Dr. 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 Renette Butters, Viona Gilmore

## 2012-07-02 NOTE — ED Notes (Signed)
MD at bedside. 

## 2012-07-02 NOTE — ED Notes (Addendum)
Pt reports diaylsis center called and told him to come to er for low hgb on labs today.  Pt did receive full dialysis today. Reports he has been more fatigued over past days. No pain. States he did notice his stool was dark this week

## 2012-07-02 NOTE — H&P (Signed)
[facility] History and Physical  Roger [name] WUJ:[id number] DOB: [birth year] DOA: 07/02/2012  Referring physician: Dr. Doug Sou PCP: [name], MD  Specialists:  1. Cardiology: Dr. [name]. 2. GI: Dr. Stan Head  Chief Complaint: Generalized weakness 7 black stools.  HPI: Roger [name] is a 72 y.o. male with extensive past medical history-HTN, paroxysmal A. fib, NTEMI/ CAD status post multiple stents, last in June 2013, ESRD on HD TTS, chronic systolic CHF, ischemic cardiomyopathy, prior GI bleeds which have been evaluated by multiple EGDs and colonoscopies. 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 He was transferred from dialysis unit secondary to generalized weakness, hemoglobin 7.2 g/dL and black stools. Patient gives 2 week history of gradual onset and slowly worsening generalized weakness but he feels that his legs are going to buckle underneath him. He denies history of falls or syncope. No chest pain or dyspnea. No dizziness or lightheadedness. He gives history of one to 2 black color stools daily he had no bright red blood in stools. Denies abdominal pain, nausea or vomiting. She did not bring this to the attention of M.D. In the ED, hemoglobin 7.7. Patient was transiently hypotensive in the ED, improved with IV fluid bolus. Hospitalist service requested to admit for further evaluation and management.   Review of Systems: All systems reviewed and apart from history of presenting illness, are negative  Past Medical History  Diagnosis Date  . HTN (hypertension)   . Arthritis   . Hypercholesteremia   . MI (myocardial infarction) 03/16/2011    inferolateral ST segment elevation MI  . GERD (gastroesophageal reflux disease)   . Atrial fibrillation     Paroxysmal - 2 episodes related to ischemic NSTEMI events  5 & 09/2011  . Gout   . S/P coronary artery stent placement, 10/01/11 with DES to mid LAD and  PTCA of Diag 2 10/01/2009    Inferior STEMI  2011 - BMS to LCx; NSTEMI 5/13 with ISR/thrombosis of Cx BMS -->multiple DES to  LCx ; staged PCI of LAD /Balloon PTCA of major Diag 09/2011 for Sx (NSTEMI)   . Anticoagulated on warfarin 10/06/2011  . ESRD on hemodialysis     [place] [facility], TTS, esrd due to HTN  . Heart murmur   . Anemia   . Schatzki's ring   . Angiodysplasia of intestine   . Aortic stenosis, moderate     2D Echocardiogram 12/13 and 08/22/11 showed moderate aortic stenosis with valve area of 1.28 sq cm. EF of 45 to 50% with severe hypokinesia of the entire inferolateral myocardium  . Right renal artery stenosis 1998   Past Surgical History  Procedure Laterality Date  . Ureteral stent placement    . Colonoscopy  04/2009    with EGD by Dr.Buccini moderately large hiatal hernia could contribute to his anemia,Schatzki ring which might accout for his intermittent dysphagia/+internal hemorrhoids ;family hx of colon ca  . Esophagogastroduodenoscopy  03/2010    by Dr.Magod/small hiatal hernia with widley patent fibrous ring,minimal bulbitis otherwise nl  . Dialysis fistula creation    . Coronary angioplasty with stent placement  08/2011 & 09/2011    Integrity Resolute DES Stents  Stent #1:  2.36mm x 14 mm (extending to just distal to the bifurcation to the mid OM, Stent #2:  2.99mm x 18mm  Stent #3:  3.96mm x 18mm; covering proximal portion of the old stent into the proximal circumflex and overlapping stent #2 distally, Stent #4: 2.37mm x 8mm (  4 overlapping in LCx- OM1; 1 in mid LAD; PTCA only of Major Diag (small caliber)  . Coronary angioplasty with stent placement  2011, 2012    Dr [name]  03/16/11  re-cannulized totally occluded circumflex, stented with Integrity bare metal stent focal 90% stenosis in LAD and diagonal branch   . Givens capsule study  04/16/10    SB erosions vs. AVMs, non-specific distal SB inflammation in setting ASA, plavix, NSAIDs  . Esophagogastroduodenoscopy  03/14/2012    Procedure:  ESOPHAGOGASTRODUODENOSCOPY (EGD);  Surgeon: Iva Boop, MD;  Location: [facility] ENDOSCOPY;  Service: Endoscopy;  Laterality: N/A;  . Colonoscopy  03/16/2012    Procedure: COLONOSCOPY;  Surgeon: Iva Boop, MD;  Location: [facility] ENDOSCOPY;  Service: Endoscopy;  Laterality: N/A;  . Right renal artery, pta and stenting  08/22/1996    Dr [name], P-154 Palmaz stent mounted on a 6mm x 2cm PowerFlex and deployed in the renal ostium at 7 atmospheres  reduction of 95% lesion to 0% residual without dissection     Social History:  reports that he quit smoking about 20 years ago. His smoking use included Cigarettes. He has a 22.5 pack-year smoking history. His smokeless tobacco use includes Chew. He reports that he does not drink alcohol or use illicit drugs. Married and lives with wife. Independent of activities of daily living.  No Known Allergies  Family History  Problem Relation Age of Onset  . Coronary artery disease Father   . Hypertension Mother   . Coronary artery disease Mother   . Colon cancer Brother 1    Prior to Admission medications   Medication Sig Start Date End Date Taking? Authorizing Provider  allopurinol (ZYLOPRIM) 100 MG tablet Take 50 mg by mouth 2 (two) times daily.    Yes Historical Provider, MD  amiodarone (PACERONE) 200 MG tablet Take 200 mg by mouth at bedtime.  10/15/11 10/14/12 Yes Nada Boozer, NP  Cimetidine (TAGAMET PO) Take 1 tablet by mouth at bedtime as needed (OTC). For acid reflux.   Yes Historical Provider, MD  isosorbide mononitrate (IMDUR) 30 MG 24 hr tablet Take 30 mg by mouth at bedtime.   Yes Historical Provider, MD  metoprolol tartrate (LOPRESSOR) 25 MG tablet Take 25 mg by mouth 2 (two) times daily.   Yes Historical Provider, MD  multivitamin (RENA-VIT) TABS tablet Take 1 tablet by mouth every morning.    Yes Historical Provider, MD  pantoprazole (PROTONIX) 40 MG tablet Take 40 mg by mouth 2 (two) times daily.    Yes Historical Provider, MD  PARoxetine  (PAXIL) 20 MG tablet Take 1 tablet (20 mg total) by mouth daily. 08/26/11 08/25/12 Yes Wilburt Finlay, PA-C  prasugrel (EFFIENT) 10 MG TABS Take 10 mg by mouth at bedtime.   Yes Historical Provider, MD  rosuvastatin (CRESTOR) 10 MG tablet Take 10 mg by mouth at bedtime.    Yes Historical Provider, MD  sevelamer (RENVELA) 800 MG tablet Take 2,400 mg by mouth 3 (three) times daily with meals.   Yes Historical Provider, MD  nitroGLYCERIN (NITROSTAT) 0.4 MG SL tablet Place 1 tablet (0.4 mg total) under the tongue every 5 (five) minutes as needed. 08/26/11   Wilburt Finlay, PA-C   Physical Exam: Filed Vitals:   07/02/12 1318 07/02/12 1330 07/02/12 1356 07/02/12 1515  BP: 127/34 95/47 102/49 111/54  Pulse: 58 59 62 63  Temp:    98.7 F (37.1 C)  TempSrc:    Oral  Resp: 19 19 20  18  Height:    5\' 10"  (1.778 m)  Weight:    79.2 kg (174 lb 9.7 oz)  SpO2: 96% 95% 97% 99%     General exam: Moderately built and nourished male patient, lying comfortably supine on the gurney in no obvious distress.  Head, eyes and ENT: Nontraumatic and normocephalic. Pupils equally reacting to light and accommodation. Oral mucosa moist. Pale mucosa.  Neck: Supple. No JVD, carotid bruit or thyromegaly.  Lymphatics: No lymphadenopathy.  Respiratory system: Clear to auscultation. No increased work of breathing.  Cardiovascular system: S1 and S2 heard, RRR. No JVD, gallops, clicks or pedal edema. Patient has grade 2/6 systolic ejection murmur at apex.  Gastrointestinal system: Abdomen is nondistended, soft and nontender. Normal bowel sounds heard. No organomegaly or masses appreciated.  Central nervous system: Alert and oriented. No focal neurological deficits.  Extremities: Symmetric 5 x 5 power. Peripheral pulses symmetrically felt.  Skin: No rashes or acute findings.  Musculoskeletal system: Negative exam.  Psychiatry: Pleasant and cooperative.   Labs on Admission:  Basic Metabolic Panel:  Recent Labs Lab  07/02/12 1254  NA 140  K 3.6  CL 95*  GLUCOSE 123*  BUN 11  CREATININE 3.10*   Liver Function Tests: No results found for this basename: AST, ALT, ALKPHOS, BILITOT, PROT, ALBUMIN,  in the last 168 hours No results found for this basename: LIPASE, AMYLASE,  in the last 168 hours No results found for this basename: AMMONIA,  in the last 168 hours CBC:  Recent Labs Lab 07/02/12 1240 07/02/12 1254  WBC 4.8  --   NEUTROABS 2.5  --   HGB 7.7* 8.2*  HCT 23.4* 24.0*  MCV 113.6*  --   PLT 218  --    Cardiac Enzymes: No results found for this basename: CKTOTAL, CKMB, CKMBINDEX, TROPONINI,  in the last 168 hours  BNP (last 3 results) No results found for this basename: PROBNP,  in the last 8760 hours CBG: No results found for this basename: GLUCAP,  in the last 168 hours  Radiological Exams on Admission: No results found.   Assessment/Plan Active Problems:   ANEMIA-UNSPECIFIED   CAD, MI CFX BMS 12/11 with ISR DES (as well as OM) May 2013   HTN (hypertension)   HLD (hyperlipidemia)   GERD (gastroesophageal reflux disease)   PAF (paroxysmal atrial fibrillation)   End stage renal disease on dialysis; HD T, Th Sat   Hypotension   Aortic stenosis, moderate, December 2013   Ischemic cardiomyopathy, EF 45-50% echo 12/13   H/O diastolic dysfunction   Upper GI bleeding   1. Symptomatic anemia: Most likely secondary to slow GI bleed complicating ESRD. We'll transfuse one unit of PRBCs and follow H&H closely. 2. Upper GI bleed: Possibly a subacute/slow GI bleed from underlying small bowel AVMs. Discussed with patient's primary cardiology team-Dr. Renea Ee to hold Prasugrel in the context of bleeding. [facility] has been consulted by EDP. Clear liquid diet and IV Protonix twice a day. 3. Hypertension: Was briefly hypotensive in the ED, possibly from recent HD. Improved after IV fluid bolus. Monitor. 4. CAD, status post multiple stents: Continue beta blockers and hold  antiplatelets. Asymptomatic of chest pain. EKG shows diffuse ST depression and T-wave inversion but patient asymptomatic. 5. Paroxysmal A. fib: Continue amiodarone and beta blockers. 6. ESRD: Nephrology consulted. Patient just completed dialysis today. 7. History of chronic systolic CHF/ischemic cardiomyopathy: Compensated.      Code Status: Full  Family Communication: Discussed with patient's spouse at bedside.  Disposition Plan: Home when medically stable   Time spent: 60 minutes.  [facility] Pager [phone]  If 7PM-7AM, please contact night-coverage [email or web address] Password [facility] 07/02/2012, 3:23 PM

## 2012-07-02 NOTE — Progress Notes (Signed)
Nursing  Blood bank called RN, unable to match pt blood antibodies, they are continuing to work on it and will notify as soon as blood becomes avaliable.  Will notify blood bank if pt becomes unstable or begins to actively bleed.  Benedetto Coons notified.  No orders given.

## 2012-07-03 ENCOUNTER — Encounter (HOSPITAL_COMMUNITY)
Admission: EM | Disposition: A | Discharge status: Home / Self Care | Payer: Self-pay | Source: Home / Self Care | Attending: Internal Medicine

## 2012-07-03 DIAGNOSIS — N189 Chronic kidney disease, unspecified: Secondary | ICD-10-CM

## 2012-07-03 DIAGNOSIS — N186 End stage renal disease: Secondary | ICD-10-CM

## 2012-07-03 DIAGNOSIS — N039 Chronic nephritic syndrome with unspecified morphologic changes: Secondary | ICD-10-CM

## 2012-07-03 DIAGNOSIS — Z7902 Long term (current) use of antithrombotics/antiplatelets: Secondary | ICD-10-CM

## 2012-07-03 DIAGNOSIS — K922 Gastrointestinal hemorrhage, unspecified: Secondary | ICD-10-CM

## 2012-07-03 DIAGNOSIS — I359 Nonrheumatic aortic valve disorder, unspecified: Secondary | ICD-10-CM

## 2012-07-03 DIAGNOSIS — Z5181 Encounter for therapeutic drug level monitoring: Secondary | ICD-10-CM

## 2012-07-03 DIAGNOSIS — R195 Other fecal abnormalities: Secondary | ICD-10-CM

## 2012-07-03 DIAGNOSIS — D62 Acute posthemorrhagic anemia: Secondary | ICD-10-CM

## 2012-07-03 LAB — HEMOGLOBIN AND HEMATOCRIT, BLOOD
HCT: 18.6 % — ABNORMAL LOW (ref 39.0–52.0)
HCT: 19.1 % — ABNORMAL LOW (ref 39.0–52.0)
Hemoglobin: 6.3 g/dL — CL (ref 13.0–17.0)

## 2012-07-03 LAB — BASIC METABOLIC PANEL
BUN: 20 mg/dL (ref 6–23)
CO2: 31 mEq/L (ref 19–32)
Calcium: 9.5 mg/dL (ref 8.4–10.5)
Creatinine, Ser: 5.01 mg/dL — ABNORMAL HIGH (ref 0.50–1.35)
GFR calc Af Amer: 12 mL/min — ABNORMAL LOW (ref >90)

## 2012-07-03 LAB — CBC
HCT: 18.4 % — ABNORMAL LOW (ref 39.0–52.0)
RDW: 16.5 % — ABNORMAL HIGH (ref 11.5–15.5)
WBC: 4.4 10*3/uL (ref 4.0–10.5)

## 2012-07-03 LAB — HEMOGLOBIN: Hemoglobin: 6.4 g/dL — CL (ref 13.0–17.0)

## 2012-07-03 SURGERY — EGD (ESOPHAGOGASTRODUODENOSCOPY)
Anesthesia: Moderate Sedation

## 2012-07-03 MED ORDER — SODIUM CHLORIDE 0.9 % IJ SOLN
INTRAMUSCULAR | Status: AC
  Administered 2012-07-03: 13 mL
  Filled 2012-07-03: qty 10

## 2012-07-03 MED ORDER — ISOSORBIDE MONONITRATE ER 30 MG PO TB24
30.0000 mg | ORAL_TABLET | Freq: Every day | ORAL | Status: DC
  Administered 2012-07-03 – 2012-07-07 (×5): 30 mg via ORAL
  Filled 2012-07-03 (×6): qty 1

## 2012-07-03 MED ORDER — METOPROLOL TARTRATE 25 MG PO TABS
25.0000 mg | ORAL_TABLET | Freq: Two times a day (BID) | ORAL | Status: DC
  Administered 2012-07-04 – 2012-07-07 (×2): 25 mg via ORAL
  Filled 2012-07-03 (×11): qty 1

## 2012-07-03 NOTE — Consult Note (Signed)
Referring Provider: [facility] Primary Care Physician:  [name], MD Primary Gastroenterologist:  Dr.Gessner  Reason for Consultation:  Recurrent Gi Bleeding,severe anemia in pt on Effient  HPI: Roger [name] is a 72 y.o. male  admitted yesterday after dialysis due to complaints of progressive weakness and drifting hemoglobin to be 7.2 range. Patient has history of chronic anemia with baseline hemoglobin in the 9 range. He has multiple serious medical problems including end-stage renal disease, on dialysis, significant coronary artery disease he has multiple drug-eluting stents and has been on Effient. He also has history of atrial fibrillation, aortic stenosis, ischemic cardiomyopathy with EF of 45%, peripheral arterial disease, and history of intestinal AVMs. Patient underwent upper endoscopy and colonoscopy in December of 2013 per Dr. Leone Payor for similar complaints of melena. EGD did not show any evidence of source for bleed. Colonoscopy was also unrevealing. At that time it was felt that he should have capsule endoscopy if he had further bleeding. Patient relates that he did have capsule endoscopy done in refill a couple of years ago and that nothing was seen. Patient states that he has been seeing black stools over the past week and a half to, 2 weeks and had been having one black bowel movement per day. His last bowel movement was yesterday morning he has not had any since. He has no complaints of abdominal pain, nausea ,vomiting, dysphagia, odynophagia or change in bowel habits. This morning his hemoglobin has drifted to 6.4 and he feels poorly in general. Unfortunately there has been difficulty locating blood for him due to  development of a new antibody.   Past Medical History  Diagnosis Date  . HTN (hypertension)   . Arthritis   . Hypercholesteremia   . MI (myocardial infarction) 03/16/2011    inferolateral ST segment elevation MI  . GERD (gastroesophageal reflux disease)    . Atrial fibrillation     Paroxysmal - 2 episodes related to ischemic NSTEMI events  5 & 09/2011  . Gout   . S/P coronary artery stent placement, 10/01/11 with DES to mid LAD and  PTCA of Diag 2 10/01/2009    Inferior STEMI 2011 - BMS to LCx; NSTEMI 5/13 with ISR/thrombosis of Cx BMS -->multiple DES to  LCx ; staged PCI of LAD /Balloon PTCA of major Diag 09/2011 for Sx (NSTEMI)   . Anticoagulated on warfarin 10/06/2011  . ESRD on hemodialysis     [place] [facility], TTS, esrd due to HTN  . Heart murmur   . Anemia   . Schatzki's ring   . Angiodysplasia of intestine   . Aortic stenosis, moderate     2D Echocardiogram 12/13 and 08/22/11 showed moderate aortic stenosis with valve area of 1.28 sq cm. EF of 45 to 50% with severe hypokinesia of the entire inferolateral myocardium  . Right renal artery stenosis 1998    Past Surgical History  Procedure Laterality Date  . Ureteral stent placement    . Colonoscopy  04/2009    with EGD by Dr.Buccini moderately large hiatal hernia could contribute to his anemia,Schatzki ring which might accout for his intermittent dysphagia/+internal hemorrhoids ;family hx of colon ca  . Esophagogastroduodenoscopy  03/2010    by Dr.Magod/small hiatal hernia with widley patent fibrous ring,minimal bulbitis otherwise nl  . Dialysis fistula creation    . Coronary angioplasty with stent placement  08/2011 & 09/2011    Integrity Resolute DES Stents  Stent #1:  2.73mm x 14 mm (extending to just distal to the  bifurcation to the mid OM, Stent #2:  2.66mm x 18mm  Stent #3:  3.57mm x 18mm; covering proximal portion of the old stent into the proximal circumflex and overlapping stent #2 distally, Stent #4: 2.55mm x 8mm (4 overlapping in LCx- OM1; 1 in mid LAD; PTCA only of Major Diag (small caliber)  . Coronary angioplasty with stent placement  2011, 2012    Dr [name]  03/16/11  re-cannulized totally occluded circumflex, stented with Integrity bare metal stent focal 90% stenosis in LAD  and diagonal branch   . Givens capsule study  04/16/10    SB erosions vs. AVMs, non-specific distal SB inflammation in setting ASA, plavix, NSAIDs  . Esophagogastroduodenoscopy  03/14/2012    Procedure: ESOPHAGOGASTRODUODENOSCOPY (EGD);  Surgeon: Iva Boop, MD;  Location: [facility] ENDOSCOPY;  Service: Endoscopy;  Laterality: N/A;  . Colonoscopy  03/16/2012    Procedure: COLONOSCOPY;  Surgeon: Iva Boop, MD;  Location: [facility] ENDOSCOPY;  Service: Endoscopy;  Laterality: N/A;  . Right renal artery, pta and stenting  08/22/1996    Dr [name], P-154 Palmaz stent mounted on a 6mm x 2cm PowerFlex and deployed in the renal ostium at 7 atmospheres  reduction of 95% lesion to 0% residual without dissection      Prior to Admission medications   Medication Sig Start Date End Date Taking? Authorizing Provider  allopurinol (ZYLOPRIM) 100 MG tablet Take 50 mg by mouth 2 (two) times daily.    Yes Historical Provider, MD  amiodarone (PACERONE) 200 MG tablet Take 200 mg by mouth at bedtime.  10/15/11 10/14/12 Yes Nada Boozer, NP  Cimetidine (TAGAMET PO) Take 1 tablet by mouth at bedtime as needed (OTC). For acid reflux.   Yes Historical Provider, MD  isosorbide mononitrate (IMDUR) 30 MG 24 hr tablet Take 30 mg by mouth at bedtime.   Yes Historical Provider, MD  metoprolol tartrate (LOPRESSOR) 25 MG tablet Take 25 mg by mouth 2 (two) times daily.   Yes Historical Provider, MD  multivitamin (RENA-VIT) TABS tablet Take 1 tablet by mouth every morning.    Yes Historical Provider, MD  pantoprazole (PROTONIX) 40 MG tablet Take 40 mg by mouth 2 (two) times daily.    Yes Historical Provider, MD  PARoxetine (PAXIL) 20 MG tablet Take 1 tablet (20 mg total) by mouth daily. 08/26/11 08/25/12 Yes Wilburt Finlay, PA-C  prasugrel (EFFIENT) 10 MG TABS Take 10 mg by mouth at bedtime.   Yes Historical Provider, MD  rosuvastatin (CRESTOR) 10 MG tablet Take 10 mg by mouth at bedtime.    Yes Historical Provider, MD  sevelamer (RENVELA)  800 MG tablet Take 2,400 mg by mouth 3 (three) times daily with meals.   Yes Historical Provider, MD  nitroGLYCERIN (NITROSTAT) 0.4 MG SL tablet Place 1 tablet (0.4 mg total) under the tongue every 5 (five) minutes as needed. 08/26/11   Wilburt Finlay, PA-C    Current Facility-Administered Medications  Medication Dose Route Frequency Provider Last Rate Last Dose  . acetaminophen (TYLENOL) tablet 650 mg  650 mg Oral Q6H PRN Elease Etienne, MD       Or  . acetaminophen (TYLENOL) suppository 650 mg  650 mg Rectal Q6H PRN Elease Etienne, MD      . albuterol (PROVENTIL) (5 MG/ML) 0.5% nebulizer solution 2.5 mg  2.5 mg Nebulization Q2H PRN Elease Etienne, MD      . allopurinol (ZYLOPRIM) tablet 50 mg  50 mg Oral BID Elease Etienne, MD  50 mg at 07/02/12 2211  . amiodarone (PACERONE) tablet 200 mg  200 mg Oral QHS Elease Etienne, MD      . atorvastatin (LIPITOR) tablet 20 mg  20 mg Oral q1800 Elease Etienne, MD   20 mg at 07/02/12 1742  . Chlorhexidine Gluconate Cloth 2 % PADS 6 each  6 each Topical Q0600 Elease Etienne, MD      . isosorbide mononitrate (IMDUR) 24 hr tablet 30 mg  30 mg Oral QHS Elease Etienne, MD   30 mg at 07/02/12 2211  . metoprolol tartrate (LOPRESSOR) tablet 25 mg  25 mg Oral BID Elease Etienne, MD      . multivitamin (RENA-VIT) tablet 1 tablet  1 tablet Oral BH-q7a Elease Etienne, MD      . mupirocin ointment (BACTROBAN) 2 % 1 application  1 application Nasal BID Elease Etienne, MD   1 application at 07/03/12 [phone]  . nitroGLYCERIN (NITROSTAT) SL tablet 0.4 mg  0.4 mg Sublingual Q5 min PRN Elease Etienne, MD      . ondansetron [facility]) tablet 4 mg  4 mg Oral Q6H PRN Elease Etienne, MD       Or  . ondansetron (ZOFRAN) injection 4 mg  4 mg Intravenous Q6H PRN Elease Etienne, MD      . pantoprazole (PROTONIX) injection 40 mg  40 mg Intravenous Q12H Doug Sou, MD   40 mg at 07/03/12 0921  . PARoxetine (PAXIL) tablet 20 mg  20 mg Oral Daily Elease Etienne, MD      . sevelamer carbonate (RENVELA) tablet 2,400 mg  2,400 mg Oral TID WC Elease Etienne, MD   1,600 mg at 07/02/12 1742  . sodium chloride 0.9 % injection 3 mL  3 mL Intravenous Q12H Elease Etienne, MD   3 mL at 07/03/12 9604    Allergies as of 07/02/2012  . (No Known Allergies)    Family History  Problem Relation Age of Onset  . Coronary artery disease Father   . Hypertension Mother   . Coronary artery disease Mother   . Colon cancer Brother 28    History   Social History  . Marital Status: Married    Spouse Name: N/A    Number of Children: 3  . Years of Education: N/A   Occupational History  . retired    Social History Main Topics  . Smoking status: Former Smoker -- 0.50 packs/day for 45 years    Types: Cigarettes    Quit date: 04/13/1992  . Smokeless tobacco: Current User    Types: Chew  . Alcohol Use: No  . Drug Use: No  . Sexually Active: No   Other Topics Concern  . 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     Review of Systems: Pertinent positive and negative review of systems were noted in the above HPI section.  All other review of systems was otherwise negative. Physical Exam: Vital signs in last 24 hours: Temp:  [98.3 F (36.8 C)-99.7 F (37.6 C)] 99.5 F (37.5 C) (03/23 0925) Pulse Rate:  [58-65] 62 (03/23 0935) Resp:  [0-23] 23 (03/23 0935) BP: (93-127)/(34-58) 106/58 mmHg (03/23 0935) SpO2:  [92 %-99 %] 97 % (03/23 0935) Weight:  [174 lb 9.7 oz (79.2 kg)-185 lb 3 oz (84 kg)] 185 lb 3 oz (84 kg) (03/23 0600) Last BM Date: 07/02/12 General:  Alert,  Well-developed, well-nourished,older WM, pleasant and cooperative in NAD Head:  Normocephalic and atraumatic. Eyes:  Sclera clear, no icterus.   Conjunctiva pale Ears:  Normal auditory acuity. Nose:  No deformity, discharge,  or lesions. Mouth:  No deformity or lesions.   Neck:  Supple; no masses or thyromegaly. Lungs:   Clear throughout to auscultation.   No wheezes, crackles, or rhonchi. Heart:  Regular rate and rhythm; no murmurs, clicks, rubs,  or gallops. Abdomen:  Soft,nontender, BS active,nonpalp mass or hsm.   Rectal:  Deferred  Msk:  Symmetrical without gross deformities. . Pulses:  Normal pulses noted. Extremities:  Without clubbing or edema. Neurologic:  Alert and  oriented x4;  grossly normal neurologically. Skin:  Intact without significant lesions or rashes.. Psych:  Alert and cooperative. Normal mood and affect.  Intake/Output from previous day: 03/22 0701 - 03/23 0700 In: 120 [P.O.:120] Out: -  Intake/Output this shift:    Lab Results:  Recent Labs  07/02/12 1240 07/02/12 1254 07/02/12 2259 07/03/12 0303 07/03/12 0528  WBC 4.8  --   --   --   --   HGB 7.7* 8.2* 6.2* 6.4* 6.3*  HCT 23.4* 24.0* 18.6*  --  19.1*  PLT 218  --   --   --   --    BMET  Recent Labs  07/02/12 1254 07/03/12 0303  NA 140 139  K 3.6 4.2  CL 95* 96  CO2  --  31  GLUCOSE 123* 102*  BUN 11 20  CREATININE 3.10* 5.01*  CALCIUM  --  9.5   LFT No results found for this basename: PROT, ALBUMIN, AST, ALT, ALKPHOS, BILITOT, BILIDIR, IBILI,  in the last 72 hours   IMPRESSION:  #72 72 year old male with acute/subacute GI bleed in a patient with chronic anemia and chronic anticoagulation with FE on. His hemoglobin has dropped 1 g over the past 24 hours. His bleeding is likely secondary to AVMs, given recent unrevealing EGD and colonoscopy December 2013. Cannot rule out gastric or duodenal AVM or other lesion. #2 end-stage renal disease on dialysis #3 development of new antibodies making transfusion compatibility difficult #4 history of atrial fibrillation #5 ischemic cardiomyopathy EF 45-50% #6 previously documented AVMs #7 coronary artery disease status post multiple drug-eluting stents  PLAN: #1 we'll hold off on EGD today, will need upper endoscopy and if negative capsule endoscopy this  admission. #2 await hematology evaluation this morning. I have spoken to the blood bank and they do have 2 units of blood available which are phenotypically   Matched- but there is currently no match available for his new antibody. It is felt that serious transfusion reaction would be unlikely and if this did occur would be hemolytic in nature. I have asked the blood bank to go ahead and try to procure 2 more units of blood. He would need to be premedicated. Also await hematology advice regarding infusion of factor X to help reverse Effient We will follow closely with you   [facility]  07/03/2012, 9:46 AM

## 2012-07-03 NOTE — Progress Notes (Signed)
[facility] Progress Note [place] TEAM 1 - Stepdown/ICU TEAM   Roger [name]:[id number] DOB: 1940/05/26 DOA: 07/02/2012 PCP: [name], MD  Brief narrative: 72 y.o. male with extensive past medical history - HTN, paroxysmal A fib, NSTEMI / CAD status post multiple stents (last in June 2013), ESRD on HD TTS, chronic systolic CHF, ischemic cardiomyopathy, prior GI bleeds which have been evaluated by multiple EGDs and colonoscopies. 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 He was transferred from dialysis unit secondary to generalized weakness, hemoglobin 7.2 g/dL and black stools. Patient gave a 2 week history of gradual onset and slowly worsening generalized weakness but he felt that his legs were going to buckle underneath him. He denied history of falls or syncope. No chest pain or dyspnea. No dizziness or lightheadedness. He gave history of one to 2 black color stools daily - no bright red blood in stools. Denied abdominal pain, nausea or vomiting. In the ED, hemoglobin 7.7. Patient was transiently hypotensive in the ED, improved with IV fluid bolus. Hospitalist service requested to admit for further evaluation and management.  Assessment/Plan:  Melena - Upper GI Bleeding - recurrent/chronic GI following  Acute on chronic anemia - blood loss in setting of anemia of chronic renal disease Baseline Hgb ~9 - unable to transfuse at present due to difficulty with cross-reactivity - continue to follow clinically - will only transfuse currently available blood if becomes emergently necessary  CAD w/ hx of MI s/p multiple PTCA + stenting procedures (BMS and DES) Most recent procedure 10/01/2011 DES to mid LAD and PTCA of Diag 2 - asymptomatic at present  HTN  Not an active problem at the present time  HLD   GERD Well-controlled  PAF Currently in normal sinus rhythm - holding anticoagulation in setting of bleeding  End stage renal disease on dialysis; HD T, Th  Sat Nephrology is following  Aortic stenosis, moderate, December 2013  Ischemic cardiomyopathy, EF 45-50% echo 12/13  +MRSA screen Usual protocol  Code Status: FULL Family Communication: Spoke with patient and wife at bedside Disposition Plan:  Step down unit  Consultants: Gastroenterology Nephrology  Procedures: None  Antibiotics: None  DVT prophylaxis: SCDs  HPI/Subjective:  The patient is resting comfortably the present time.  He denies lightheadedness fevers chills chest pain or shortness of breath.  He denies abdominal pain.  He has not had a bowel movement since he has been admitted per his report.  Objective: Blood pressure 98/55, pulse 69, temperature 100 F (37.8 C), temperature source Oral, resp. rate 20, height 5\' 10"  (1.778 m), weight 84 kg (185 lb 3 oz), SpO2 95.00%.  Intake/Output Summary (Last 24 hours) at 07/03/12 1526 Last data filed at 07/03/12 1300  Gross per 24 hour  Intake    520 ml  Output      0 ml  Net    520 ml   Exam: General: No acute respiratory distress Lungs: Clear to auscultation bilaterally without wheezes or crackles Cardiovascular: Regular rate and rhythm without murmur gallop or rub normal S1 and S2 Abdomen: Nontender, nondistended, soft, bowel sounds positive, no rebound, no ascites, no appreciable mass Extremities: No significant cyanosis, clubbing, or edema bilateral lower extremities  Data Reviewed: Basic Metabolic Panel:  Recent Labs Lab 07/02/12 1254 07/03/12 0303  NA 140 139  K 3.6 4.2  CL 95* 96  CO2  --  31  GLUCOSE 123* 102*  BUN 11 20  CREATININE 3.10* 5.01*  CALCIUM  --  9.5   CBC:  Recent Labs Lab 07/02/12 1240 07/02/12 1254 07/02/12 2259 07/03/12 0303 07/03/12 0528  WBC 4.8  --   --   --   --   NEUTROABS 2.5  --   --   --   --   HGB 7.7* 8.2* 6.2* 6.4* 6.3*  HCT 23.4* 24.0* 18.6*  --  19.1*  MCV 113.6*  --   --   --   --   PLT 218  --   --   --   --     Recent Results (from the past 240  hour(s))  MRSA PCR SCREENING     Status: Abnormal   Collection Time    07/02/12  3:16 PM      Result Value Range Status   MRSA by PCR POSITIVE (*) NEGATIVE Final   Comment:            The GeneXpert MRSA Assay (FDA     approved for NASAL specimens     only), is one component of a     comprehensive MRSA colonization     surveillance program. It is not     intended to diagnose MRSA     infection nor to guide or     monitor treatment for     MRSA infections.     RESULT CALLED TO, READ BACK BY AND VERIFIED WITH:     Liam Graham RN AT 1610 07/02/12 BY Miquel Dunn     Studies:  Recent x-ray studies have been reviewed in detail by the Attending Physician  Scheduled Meds:  Scheduled Meds: . allopurinol  50 mg Oral BID  . amiodarone  200 mg Oral QHS  . atorvastatin  20 mg Oral q1800  . Chlorhexidine Gluconate Cloth  6 each Topical Q0600  . isosorbide mononitrate  30 mg Oral QHS  . metoprolol tartrate  25 mg Oral BID  . multivitamin  1 tablet Oral BH-q7a  . mupirocin ointment  1 application Nasal BID  . pantoprazole (PROTONIX) IV  40 mg Intravenous Q12H  . PARoxetine  20 mg Oral Daily  . sevelamer carbonate  2,400 mg Oral TID WC  . sodium chloride  3 mL Intravenous Q12H   Continuous Infusions:   Time spent on care of this patient:   [facility] T  [facility] Office  [phone] Pager - Text Page per Loretha Stapler as per below:  On-Call/Text Page:      [email or web address]      password [email or web address]  If 7PM-7AM, please contact night-coverage [email or web address] Password [facility] 07/03/2012, 3:26 PM   LOS: 1 day

## 2012-07-03 NOTE — Progress Notes (Signed)
Nursing  Spoke with blood bank, they require an emergent release of incompatible blood order in order to transfuse pt tonight.  Unable to match pt blood at this time.  Benedetto Coons notified and wants to wait for rounding physician to make decision in am due to risk vs benefit. Pt remains asymptomatic and vs remain stable.  Will continue to monitor pt.

## 2012-07-03 NOTE — Progress Notes (Signed)
Nursing Pt hgb 6.2. Notified blood bank and asked to get blood ready emergently.  Benedetto Coons notified.  Will transfuse pt as soon as blood is available .

## 2012-07-03 NOTE — Progress Notes (Signed)
CRITICAL VALUE ALERT  Critical value received:  Hemoglobin - 6.2  Date of notification:  07/03/12  Time of notification:  0020  Critical value read back: YES  Nurse who received alert:  Elisha Headland RN   MD notified (1st page):  Lenny Pastel NP   Time of first page:  0020  Elisha Headland RN   I: Notified MD and pts direct RN Kenney Houseman.

## 2012-07-03 NOTE — Progress Notes (Signed)
[facility] progress note. Chief complaint. Anemia. History of present illness. This 72 year old male admitted with generalized weakness and tarry black stools. He was felt to have a likely slow GI bleed complicated by end-stage renal disease. His hemoglobin at baseline appears to be in the 8-9 range. Admitting hemoglobin was 7.7. Plan was to transfuse one unit of packed red blood cells. Unfortunately I got a call from nursing at the blood bank reports multiple antibodies making finding an appropriate unit of blood difficult. Their feeling was that it could possibly take as long as Monday to obtain compatible blood. The patient is noted to be hemodynamically stable at this time. Blood bank indicates that in and of blood with partial compatibility could be obtained but this would put the patient at increased risk of transfusion reaction. I discussed this with my supervising physician Doctor Julian Reil who recommended I speak with hematology to establish standard of care here. I discussed the case with Doctor Chism hematology who felt that the risks of partial antibiotic he compatibility outweighed the potential benefit for a patient who is chronically anemic with a hemoglobin range of 8-9. Oncology will consult on the patient later this a.m. and make further recommendations regarding transfusion at that time.

## 2012-07-03 NOTE — Consult Note (Signed)
[facility] Renal Consultation Note  Indication for Consultation:  Management of ESRD/hemodialysis; anemia, hypertension/volume and secondary hyperparathyroidism  HPI: Roger [name] is a 72 y.o. male with ESRD on dialysis on TTS in [place] who presented to the ED yesterday for progressive weakness with dark stools for two weeks and decreasing hemoglobin from 8.9 on 3/13 to 7.9 on 3/20 and 7.2 in-center yesterday.  His hemoglobin is now down to 6.4, and he continues to feel very tired and sluggish, although he denies any abdominal pain or change in bowel habits. He was hospitalized 11/30-12/5/13 for acute blood loss anemia with black tarry stools, but upper endoscopy and colonoscopy showed no source of bleeding.  With his history of small bowel AVMs, seen previously with capsule endoscopy, it was believed that his bleeding was secondary to Effient and Coumadin which he took for atrial fibrillation and drug-eluting stents for coronary artery disease.  His Coumadin was stopped at that time, and his hemoglobin was stable during follow-up with Dr. Leone Payor in December.  At this time his Effient is on hold, but he has developed new antibodies secondary to previous transfusions and currently there is no match available for him.  Since he is currently stable, transfusion of any blood with partial compatibility will be delayed due to risk of reaction.  GI and Hematology consults are pending.  The patient noted that during his follow-up with his cardiologist (Dr. [name] of [facility]) changes on his EKG indicated the need for an evaluation with stress test.    Dialysis Orders: Center: [facility] on TTS. EDW 78 kg  HD Bath 2K/2.25Ca  Time 3hrs 15 mins  Heparin 0. Access AVF @ LUA  BFR 450 DFR A2.0  Hectorol 1 mcg IV/HD   Epogen 3800 Units IV/HD  Venofer 0.   Past Medical History  Diagnosis Date  . HTN (hypertension)   . Arthritis   . Hypercholesteremia   . MI (myocardial infarction)  03/16/2011    inferolateral ST segment elevation MI  . GERD (gastroesophageal reflux disease)   . Atrial fibrillation     Paroxysmal - 2 episodes related to ischemic NSTEMI events  5 & 09/2011  . Gout   . S/P coronary artery stent placement, 10/01/11 with DES to mid LAD and  PTCA of Diag 2 10/01/2009    Inferior STEMI 2011 - BMS to LCx; NSTEMI 5/13 with ISR/thrombosis of Cx BMS -->multiple DES to  LCx ; staged PCI of LAD /Balloon PTCA of major Diag 09/2011 for Sx (NSTEMI)   . Anticoagulated on warfarin 10/06/2011  . ESRD on hemodialysis     [place] HD, TTS, esrd due to HTN  . Heart murmur   . Anemia   . Schatzki's ring   . Angiodysplasia of intestine   . Aortic stenosis, moderate     2D Echocardiogram 12/13 and 08/22/11 showed moderate aortic stenosis with valve area of 1.28 sq cm. EF of 45 to 50% with severe hypokinesia of the entire inferolateral myocardium  . Right renal artery stenosis 1998   Past Surgical History  Procedure Laterality Date  . Ureteral stent placement    . Colonoscopy  04/2009    with EGD by Dr.Buccini moderately large hiatal hernia could contribute to his anemia,Schatzki ring which might accout for his intermittent dysphagia/+internal hemorrhoids ;family hx of colon ca  . Esophagogastroduodenoscopy  03/2010    by Dr.Magod/small hiatal hernia with widley patent fibrous ring,minimal bulbitis otherwise nl  . Dialysis fistula creation    .  Coronary angioplasty with stent placement  08/2011 & 09/2011    Integrity Resolute DES Stents  Stent #1:  2.68mm x 14 mm (extending to just distal to the bifurcation to the mid OM, Stent #2:  2.47mm x 18mm  Stent #3:  3.39mm x 18mm; covering proximal portion of the old stent into the proximal circumflex and overlapping stent #2 distally, Stent #4: 2.62mm x 8mm (4 overlapping in LCx- OM1; 1 in mid LAD; PTCA only of Major Diag (small caliber)  . Coronary angioplasty with stent placement  2011, 2012    Dr [name]  03/16/11  re-cannulized  totally occluded circumflex, stented with Integrity bare metal stent focal 90% stenosis in LAD and diagonal branch   . Givens capsule study  04/16/10    SB erosions vs. AVMs, non-specific distal SB inflammation in setting ASA, plavix, NSAIDs  . Esophagogastroduodenoscopy  03/14/2012    Procedure: ESOPHAGOGASTRODUODENOSCOPY (EGD);  Surgeon: Iva Boop, MD;  Location: [facility] ENDOSCOPY;  Service: Endoscopy;  Laterality: N/A;  . Colonoscopy  03/16/2012    Procedure: COLONOSCOPY;  Surgeon: Iva Boop, MD;  Location: [facility] ENDOSCOPY;  Service: Endoscopy;  Laterality: N/A;  . Right renal artery, pta and stenting  08/22/1996    Dr [name], P-154 Palmaz stent mounted on a 6mm x 2cm PowerFlex and deployed in the renal ostium at 7 atmospheres  reduction of 95% lesion to 0% residual without dissection     Family History  Problem Relation Age of Onset  . Coronary artery disease Father   . Hypertension Mother   . Coronary artery disease Mother   . Colon cancer Brother 71   Social History He smoked a pack of cigarettes a day and used chewing tobacco before quitting in 1994, but denies any history of alcohol or illicit drugs.  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  He lives with his wife in [place], and they have three grown children.  No Known Allergies Prior to Admission medications   Medication Sig Start Date End Date Taking? Authorizing Provider  allopurinol (ZYLOPRIM) 100 MG tablet Take 50 mg by mouth 2 (two) times daily.    Yes Historical Provider, MD  amiodarone (PACERONE) 200 MG tablet Take 200 mg by mouth at bedtime.  10/15/11 10/14/12 Yes Nada Boozer, NP  Cimetidine (TAGAMET PO) Take 1 tablet by mouth at bedtime as needed (OTC). For acid reflux.   Yes Historical Provider, MD  isosorbide mononitrate (IMDUR) 30 MG 24 hr tablet Take 30 mg by mouth at bedtime.   Yes Historical Provider, MD  metoprolol tartrate (LOPRESSOR) 25 MG tablet Take 25 mg by mouth 2 (two)  times daily.   Yes Historical Provider, MD  multivitamin (RENA-VIT) TABS tablet Take 1 tablet by mouth every morning.    Yes Historical Provider, MD  pantoprazole (PROTONIX) 40 MG tablet Take 40 mg by mouth 2 (two) times daily.    Yes Historical Provider, MD  PARoxetine (PAXIL) 20 MG tablet Take 1 tablet (20 mg total) by mouth daily. 08/26/11 08/25/12 Yes Wilburt Finlay, PA-C  prasugrel (EFFIENT) 10 MG TABS Take 10 mg by mouth at bedtime.   Yes Historical Provider, MD  rosuvastatin (CRESTOR) 10 MG tablet Take 10 mg by mouth at bedtime.    Yes Historical Provider, MD  sevelamer (RENVELA) 800 MG tablet Take 2,400 mg by mouth 3 (three) times daily with meals.   Yes Historical Provider, MD  nitroGLYCERIN (NITROSTAT) 0.4 MG SL tablet Place 1  tablet (0.4 mg total) under the tongue every 5 (five) minutes as needed. 08/26/11   Wilburt Finlay, PA-C   Labs:  Results for orders placed during the hospital encounter of 07/02/12 (from the past 48 hour(s))  OCCULT BLOOD, POC DEVICE     Status: Abnormal   Collection Time    07/02/12 12:21 PM      Result Value Range   Fecal Occult Bld POSITIVE (*) NEGATIVE  CBC WITH DIFFERENTIAL     Status: Abnormal   Collection Time    07/02/12 12:40 PM      Result Value Range   WBC 4.8  4.0 - 10.5 K/uL   RBC 2.06 (*) 4.22 - 5.81 MIL/uL   Hemoglobin 7.7 (*) 13.0 - 17.0 g/dL   HCT 45.4 (*) 09.8 - 11.9 %   MCV 113.6 (*) 78.0 - 100.0 fL   MCH 37.4 (*) 26.0 - 34.0 pg   MCHC 32.9  30.0 - 36.0 g/dL   RDW 14.7 (*) 82.9 - 56.2 %   Platelets 218  150 - 400 K/uL   Neutrophils Relative 52  43 - 77 %   Neutro Abs 2.5  1.7 - 7.7 K/uL   Lymphocytes Relative 28  12 - 46 %   Lymphs Abs 1.4  0.7 - 4.0 K/uL   Monocytes Relative 13 (*) 3 - 12 %   Monocytes Absolute 0.6  0.1 - 1.0 K/uL   Eosinophils Relative 7 (*) 0 - 5 %   Eosinophils Absolute 0.3  0.0 - 0.7 K/uL   Basophils Relative 1  0 - 1 %   Basophils Absolute 0.0  0.0 - 0.1 K/uL  TYPE AND SCREEN     Status: None   Collection Time     07/02/12 12:40 PM      Result Value Range   ABO/RH(D) O POS     Antibody Screen POS     Sample Expiration 07/05/2012     DAT, IgG NEG     PT AG Type       Value: POSITIVE FOR DUFFY A ANTIGEN POSITIVE FOR s ANTIGEN POSITIVE FOR KIDD B ANTIGEN POSITIVE FOR DUFFY B ANTIGEN NEGATIVE FOR LEWIS A ANTIGEN   Antibody Identification ANTI-K     Unit Number [id number]     Blood Component Type RED CELLS,LR     Unit division 00     Status of Unit ALLOCATED     Transfusion Status OK TO TRANSFUSE     Crossmatch Result INCOMPATIBLE     Donor AG Type       Value: NEGATIVE FOR KELL ANTIGEN NEGATIVE FOR KIDD A ANTIGEN   Unit Number [id number]     Blood Component Type RED CELLS,LR     Unit division 00     Status of Unit ALLOCATED     Transfusion Status OK TO TRANSFUSE     Crossmatch Result INCOMPATIBLE     Donor AG Type       Value: NEGATIVE FOR KELL ANTIGEN NEGATIVE FOR KIDD A ANTIGEN  PROTIME-INR     Status: None   Collection Time    07/02/12 12:40 PM      Result Value Range   Prothrombin Time 14.2  11.6 - 15.2 seconds   INR 1.11  0.00 - 1.49  APTT     Status: None   Collection Time    07/02/12 12:40 PM      Result Value Range   aPTT 37  24 - 37 seconds  Comment:            IF BASELINE aPTT IS ELEVATED,     SUGGEST PATIENT RISK ASSESSMENT     BE USED TO DETERMINE APPROPRIATE     ANTICOAGULANT THERAPY.  POCT I-STAT, CHEM 8     Status: Abnormal   Collection Time    07/02/12 12:54 PM      Result Value Range   Sodium 140  135 - 145 mEq/L   Potassium 3.6  3.5 - 5.1 mEq/L   Chloride 95 (*) 96 - 112 mEq/L   BUN 11  6 - 23 mg/dL   Creatinine, Ser 6.21 (*) 0.50 - 1.35 mg/dL   Glucose, Bld 308 (*) 70 - 99 mg/dL   Calcium, Ion 6.57 (*) 1.13 - 1.30 mmol/L   TCO2 38  0 - 100 mmol/L   Hemoglobin 8.2 (*) 13.0 - 17.0 g/dL   HCT 84.6 (*) 96.2 - 95.2 %  MRSA PCR SCREENING     Status: Abnormal   Collection Time    07/02/12  3:16 PM      Result Value Range   MRSA by PCR POSITIVE (*)  NEGATIVE   Comment:            The GeneXpert MRSA Assay (FDA     approved for NASAL specimens     only), is one component of a     comprehensive MRSA colonization     surveillance program. It is not     intended to diagnose MRSA     infection nor to guide or     monitor treatment for     MRSA infections.     RESULT CALLED TO, READ BACK BY AND VERIFIED WITH:     Liam Graham RN AT 8413 07/02/12 BY WOOLLENK  PREPARE RBC (CROSSMATCH)     Status: None   Collection Time    07/02/12  4:00 PM      Result Value Range   Order Confirmation ORDER PROCESSED BY BLOOD BANK    HEMOGLOBIN AND HEMATOCRIT, BLOOD     Status: Abnormal   Collection Time    07/02/12 10:59 PM      Result Value Range   Hemoglobin 6.2 (*) 13.0 - 17.0 g/dL   Comment: REPEATED TO VERIFY     CRITICAL RESULT CALLED TO, READ BACK BY AND VERIFIED WITH:     MACK, B. RN [id number] EBANKS COLCLOUGH, S   HCT 18.6 (*) 39.0 - 52.0 %  HEMOGLOBIN     Status: Abnormal   Collection Time    07/03/12  3:03 AM      Result Value Range   Hemoglobin 6.4 (*) 13.0 - 17.0 g/dL   Comment: CRITICAL VALUE NOTED.  VALUE IS CONSISTENT WITH PREVIOUSLY REPORTED AND CALLED VALUE.  BASIC METABOLIC PANEL     Status: Abnormal   Collection Time    07/03/12  3:03 AM      Result Value Range   Sodium 139  135 - 145 mEq/L   Potassium 4.2  3.5 - 5.1 mEq/L   Chloride 96  96 - 112 mEq/L   CO2 31  19 - 32 mEq/L   Glucose, Bld 102 (*) 70 - 99 mg/dL   BUN 20  6 - 23 mg/dL   Creatinine, Ser 2.72 (*) 0.50 - 1.35 mg/dL   Comment: DELTA CHECK NOTED   Calcium 9.5  8.4 - 10.5 mg/dL   GFR calc non Af Amer 11 (*) >90 mL/min  GFR calc Af Amer 12 (*) >90 mL/min   Comment:            The eGFR has been calculated     using the CKD EPI equation.     This calculation has not been     validated in all clinical     situations.     eGFR's persistently     <90 mL/min signify     possible Chronic Kidney Disease.  HEMOGLOBIN AND HEMATOCRIT, BLOOD     Status:  Abnormal   Collection Time    07/03/12  5:28 AM      Result Value Range   Hemoglobin 6.3 (*) 13.0 - 17.0 g/dL   Comment: CRITICAL VALUE NOTED.  VALUE IS CONSISTENT WITH PREVIOUSLY REPORTED AND CALLED VALUE.   HCT 19.1 (*) 39.0 - 52.0 %   Constitutional: positive for fatigue and malaise, negative for chills, fevers and sweats Ears, nose, mouth, throat, and face: negative for earaches, hoarseness, nasal congestion and sore throat Respiratory: negative for cough, dyspnea on exertion, hemoptysis and sputum Cardiovascular: negative for chest pain, chest pressure/discomfort, dyspnea, orthopnea and palpitations Gastrointestinal: positive for dark stools; negative for abdominal pain, change in bowel habits, nausea and vomiting Genitourinary:negative, anuric Musculoskeletal:negative for arthralgias, back pain, bone pain and neck pain Neurological: negative for coordination problems, dizziness, headaches, paresthesia and speech problems  Physical Exam: Filed Vitals:   07/03/12 0935  BP: 106/58  Pulse: 62  Temp:   Resp: 23     General appearance: alert, cooperative and no distress Head: Normocephalic, without obvious abnormality, atraumatic Neck: no adenopathy, no carotid bruit, no JVD and supple, symmetrical, trachea midline Resp: clear to auscultation bilaterally Cardio: RRR with Gr II/VI systolic murmur, no rub GI: soft, non-tender; bowel sounds normal; no masses,  no organomegaly Extremities: no edema bilaterally Neurologic: Grossly normal Dialysis Access: AVF @ LUA with + bruit   Assessment/Plan: 1. GI bleed - + Hemoccult with decreasing Hgb, likely secondary to small bowel AVMs (capsule study 03/2010); no source of bleeding per EGD and colonoscopy 03/2012, but Coumadin was dc'd; Effient stopped yesterday.  GI evalaution with EGD pending. 2. ESRD -  HD on TTS in [place]; K 4.2.  Next HD on 3/25. 3. Hypertension/volume  - BP 94/51 on Metoprolol 25 mg bid; today's wt 84 kg, although  EDW 78. 4. Anemia  - Hgb down to 6.4, but stable since admission, secondary to GI bleed; difficult match for transfusion, secondary to new antibodies.  Aranesp 200 mcg on 3/25, transfuse only if anemia outweighs reaction. 5. Metabolic bone disease -  Ca 9.5, P4.1 on 3/14, iPTH 183 on 2/14; Hectorol 1 mcg, Renvela 3 with meals. 6. Nutrition - Last Alb 4, renal vitamin. 7. Paroxysmal atrial fib - HR controlled on Metoprolol, Coumadin on hold since 03/2012. 8. CAD - s/p coronary angioplasty with stent placement 2013, followed by Dr. [name]. 9. Aortic stenosis - moderate per echo 03/15/12. 10. Schatzki's ring - disrupted per EGD 03/2012.  LYLES,CHARLES 07/03/2012, 11:13 AM   Attending Nephrologist: Delano Metz, MD  Patient seen and examined.  Agree with assessment and plan as above. Vinson Moselle  MD [phone] pgr    [phone] cell 07/03/2012, 5:24 PM

## 2012-07-03 NOTE — Progress Notes (Signed)
Nursing Spoke with patient daughter Neysa Bonito.  Daughter concerned about father cardiac status.  States pt seeing Dr. [name] and would like him consulted while pt and in hospital.  Pt also requesting that pt possibly be transferred to [facility] for further GI testing.  Will get care management involved to discuss options with patient and family.

## 2012-07-03 NOTE — Consult Note (Signed)
Patient seen, examined, and I agree with the above documentation, including the assessment and plan. Subacute GI bleed previously evaluated extensively with upper and lower endoscopy as recently as December 2013. At this point we are having a hard time finding blood that is compatible for transfusion. His hemoglobin has remained 6.4  I have recommended repeating the upper endoscopy and if negative proceeding with video capsule endoscopy; but I feel it safest for him to get blood transfusion first (in the absence of an emergent reason such as active hemorrhage; which for now he does not seem to be actively bleeding) Agree with hematology input regarding transfusions in the presence of multiple red blood cell antibodies, also history of transfusion reaction with past transfusions Holding anti-coagulation ESRD per renal team Will follow, continue PPI, monitor hemoglobin

## 2012-07-04 DIAGNOSIS — Z7901 Long term (current) use of anticoagulants: Secondary | ICD-10-CM

## 2012-07-04 DIAGNOSIS — D500 Iron deficiency anemia secondary to blood loss (chronic): Secondary | ICD-10-CM

## 2012-07-04 LAB — CBC
MCH: 37.8 pg — ABNORMAL HIGH (ref 26.0–34.0)
Platelets: 156 10*3/uL (ref 150–400)
RBC: 1.43 MIL/uL — ABNORMAL LOW (ref 4.22–5.81)
WBC: 3.9 10*3/uL — ABNORMAL LOW (ref 4.0–10.5)

## 2012-07-04 LAB — FOLATE: Folate: >20 ng/mL

## 2012-07-04 LAB — GLUCOSE, CAPILLARY: Glucose-Capillary: 92 mg/dL (ref 70–99)

## 2012-07-04 LAB — RETICULOCYTES: Retic Count, Absolute: 47.2 10*3/uL (ref 19.0–186.0)

## 2012-07-04 LAB — IRON AND TIBC
Iron: 45 ug/dL (ref 42–135)
UIBC: 173 ug/dL (ref 125–400)

## 2012-07-04 MED ORDER — PANTOPRAZOLE SODIUM 40 MG PO TBEC
40.0000 mg | DELAYED_RELEASE_TABLET | Freq: Two times a day (BID) | ORAL | Status: DC
  Administered 2012-07-04 – 2012-07-08 (×9): 40 mg via ORAL
  Filled 2012-07-04 (×8): qty 1

## 2012-07-04 MED ORDER — ACETAMINOPHEN 325 MG PO TABS
650.0000 mg | ORAL_TABLET | Freq: Once | ORAL | Status: AC
  Administered 2012-07-04: 650 mg via ORAL

## 2012-07-04 MED ORDER — DIPHENHYDRAMINE HCL 50 MG/ML IJ SOLN
25.0000 mg | Freq: Once | INTRAMUSCULAR | Status: AC
  Administered 2012-07-04: 25 mg via INTRAVENOUS
  Filled 2012-07-04: qty 1

## 2012-07-04 NOTE — Progress Notes (Signed)
Per Dr. Rosie Fate: risk of partial antibody incompatibility outweighs potential benefit in this chronically anemic patient, onc will consult later this morning, no transfusion recommended at this time emergently.

## 2012-07-04 NOTE — Progress Notes (Signed)
[facility] [phone]   [phone]  CONSULT NOTE  REFERRING PHYSICIAN: Dr. Jetty Duhamel  REASON FOR CONSULTATION:  72 years old white male with severe anemia  HPI Roger [name] is a 72 y.o. male was past medical history significant for multiple medical problems including history of hypertension, arthritis, hypercholesterolemia, myocardial infarction status post stent placement and currently on anticoagulation, history of GERD, atrial fibrillation as well as history of gout and end-stage renal disease on hemodialysis. The patient has a history of anemia for longtime. He received packed rbc's transfusion few times last year. He was admitted to [facility] on 07/02/2012 with generalized weakness and black stools. He was seen in the past by GI and has multiple upper endoscopies as well as colonoscopies. Small bowel capsular endoscopy showed AVMs. Stool for Hemoccult was positive. His hemoglobin was down today to 5.4. The blood bank has problems with matching his blood because of autoantibodies. His recent iron study was normal. Ferritin was elevated. The patient has normal serum vitamin B12 level as well as serum folate. Reticulocyte count percentage was elevated indicating a good bone marrow response. The patient does not remember if he received any erythrocyte stimulating factor during his dialysis or not. He was recently on anticoagulation and antiplatelet agents by cardiology for his cardiac condition but this was placed on hold.  He is feeling fine today with no specific complaints except for fatigue and generalized weakness. She also has some shortness of breath with exertion.  @SFHPI @  Past Medical History  Diagnosis Date  . HTN (hypertension)   . Arthritis   . Hypercholesteremia   . MI (myocardial infarction) 03/16/2011    inferolateral ST segment elevation MI  . GERD (gastroesophageal reflux disease)   . Atrial fibrillation    Paroxysmal - 2 episodes related to ischemic NSTEMI events  5 & 09/2011  . Gout   . S/P coronary artery stent placement, 10/01/11 with DES to mid LAD and  PTCA of Diag 2 10/01/2009    Inferior STEMI 2011 - BMS to LCx; NSTEMI 5/13 with ISR/thrombosis of Cx BMS -->multiple DES to  LCx ; staged PCI of LAD /Balloon PTCA of major Diag 09/2011 for Sx (NSTEMI)   . Anticoagulated on warfarin 10/06/2011  . ESRD on hemodialysis     [facility] [facility], TTS, esrd due to HTN  . Heart murmur   . Anemia   . Schatzki's ring   . Angiodysplasia of intestine   . Aortic stenosis, moderate     2D Echocardiogram 12/13 and 08/22/11 showed moderate aortic stenosis with valve area of 1.28 sq cm. EF of 45 to 50% with severe hypokinesia of the entire inferolateral myocardium  . Right renal artery stenosis 1998    Past Surgical History  Procedure Laterality Date  . Ureteral stent placement    . Colonoscopy  04/2009    with EGD by Dr.Buccini moderately large hiatal hernia could contribute to his anemia,Schatzki ring which might accout for his intermittent dysphagia/+internal hemorrhoids ;family hx of colon ca  . Esophagogastroduodenoscopy  03/2010    by Dr.Magod/small hiatal hernia with widley patent fibrous ring,minimal bulbitis otherwise nl  . Dialysis fistula creation    . Coronary angioplasty with stent placement  08/2011 & 09/2011    Integrity Resolute DES Stents  Stent #1:  2.55mm x 14 mm (extending to just distal to the bifurcation to the mid OM, Stent #2:  2.57mm x 18mm  Stent #3:  3.45mm x  18mm; covering proximal portion of the old stent into the proximal circumflex and overlapping stent #2 distally, Stent #4: 2.107mm x 8mm (4 overlapping in LCx- OM1; 1 in mid LAD; PTCA only of Major Diag (small caliber)  . Coronary angioplasty with stent placement  2011, 2012    Dr [name]  03/16/11  re-cannulized totally occluded circumflex, stented with Integrity bare metal stent focal 90% stenosis in LAD and diagonal branch   .  Givens capsule study  04/16/10    SB erosions vs. AVMs, non-specific distal SB inflammation in setting ASA, plavix, NSAIDs  . Esophagogastroduodenoscopy  03/14/2012    Procedure: ESOPHAGOGASTRODUODENOSCOPY (EGD);  Surgeon: Iva Boop, MD;  Location: [facility] ENDOSCOPY;  Service: Endoscopy;  Laterality: N/A;  . Colonoscopy  03/16/2012    Procedure: COLONOSCOPY;  Surgeon: Iva Boop, MD;  Location: [facility] ENDOSCOPY;  Service: Endoscopy;  Laterality: N/A;  . Right renal artery, pta and stenting  08/22/1996    Dr [name], P-154 Palmaz stent mounted on a 6mm x 2cm PowerFlex and deployed in the renal ostium at 7 atmospheres  reduction of 95% lesion to 0% residual without dissection      Family History  Problem Relation Age of Onset  . Coronary artery disease Father   . Hypertension Mother   . Coronary artery disease Mother   . Colon cancer Brother 52    Social History History  Substance Use Topics  . Smoking status: Former Smoker -- 0.50 packs/day for 45 years    Types: Cigarettes    Quit date: 04/13/1992  . Smokeless tobacco: Current User    Types: Chew  . Alcohol Use: No    No Known Allergies  Current Facility-Administered Medications  Medication Dose Route Frequency Provider Last Rate Last Dose  . acetaminophen (TYLENOL) tablet 650 mg  650 mg Oral Q6H PRN Elease Etienne, MD   650 mg at 07/03/12 1532   Or  . acetaminophen (TYLENOL) suppository 650 mg  650 mg Rectal Q6H PRN Elease Etienne, MD      . albuterol (PROVENTIL) (5 MG/ML) 0.5% nebulizer solution 2.5 mg  2.5 mg Nebulization Q2H PRN Elease Etienne, MD      . allopurinol (ZYLOPRIM) tablet 50 mg  50 mg Oral BID Elease Etienne, MD   50 mg at 07/04/12 1036  . amiodarone (PACERONE) tablet 200 mg  200 mg Oral QHS Elease Etienne, MD      . atorvastatin (LIPITOR) tablet 20 mg  20 mg Oral q1800 Elease Etienne, MD   20 mg at 07/03/12 1803  . Chlorhexidine Gluconate Cloth 2 % PADS 6 each  6 each Topical Q0600 Elease Etienne, MD   6 each at 07/04/12 1036  . isosorbide mononitrate (IMDUR) 24 hr tablet 30 mg  30 mg Oral QHS Lonia Blood, MD   30 mg at 07/03/12 2205  . metoprolol tartrate (LOPRESSOR) tablet 25 mg  25 mg Oral BID Lonia Blood, MD   25 mg at 07/04/12 1036  . multivitamin (RENA-VIT) tablet 1 tablet  1 tablet Oral BH-q7a Elease Etienne, MD   1 tablet at 07/04/12 [phone]  . mupirocin ointment (BACTROBAN) 2 % 1 application  1 application Nasal BID Elease Etienne, MD   1 application at 07/04/12 1036  . nitroGLYCERIN (NITROSTAT) SL tablet 0.4 mg  0.4 mg Sublingual Q5 min PRN Elease Etienne, MD      . ondansetron [facility]) tablet 4 mg  4 mg Oral Q6H PRN Elease Etienne, MD       Or  . ondansetron [facility]) injection 4 mg  4 mg Intravenous Q6H PRN Elease Etienne, MD      . pantoprazole (PROTONIX) EC tablet 40 mg  40 mg Oral BID Dianah Field, PA-C   40 mg at 07/04/12 1036  . PARoxetine (PAXIL) tablet 20 mg  20 mg Oral Daily Elease Etienne, MD   20 mg at 07/04/12 1035  . sevelamer carbonate (RENVELA) tablet 2,400 mg  2,400 mg Oral TID WC Elease Etienne, MD   2,400 mg at 07/04/12 1255  . sodium chloride 0.9 % injection 3 mL  3 mL Intravenous Q12H Elease Etienne, MD   3 mL at 07/04/12 1036    Review of Systems  A comprehensive review of systems was negative except for: Constitutional: positive for anorexia and fatigue Respiratory: positive for dyspnea on exertion Gastrointestinal: positive for melena and reflux symptoms Musculoskeletal: positive for muscle weakness  Physical Exam  WUJ:WJXBJ, healthy, no distress, well nourished and well developed SKIN: skin color, texture, turgor are normal HEAD: Normocephalic EYES: normal, PERRLA EARS: External ears normal OROPHARYNX:no exudate and no erythema  NECK: supple, no adenopathy LYMPH:  no palpable lymphadenopathy, no hepatosplenomegaly LUNGS: clear to auscultation  HEART: regular rate & rhythm and no murmurs ABDOMEN:abdomen  soft, non-tender, normal bowel sounds and no masses or organomegaly EXTREMITIES:no edema, no skin discoloration  NEURO: alert & oriented x 3 with fluent speech, no focal motor/sensory deficits  LABORATORY DATA: Lab Results  Component Value Date   WBC 3.9* 07/04/2012   HGB 5.4* 07/04/2012   HCT 16.0* 07/04/2012   MCV 111.9* 07/04/2012   PLT 156 07/04/2012    @LASTCHEM @  RADIOGRAPHIC STUDIES: No results found.  ASSESSMENT: This is a very pleasant 72 years old white male with severe anemia secondary to gastrointestinal blood loss secondary to chronic anticoagulation as well as anti-platelets treatment with Prasugrel in a patient with end-stage renal disease which is usually associated with higher bleeding risk in these patients. The patient also has trouble with PRBCs matching secondary to antibodies.  PLAN: I have a lengthy discussion with the patient and his wife today about his condition. I agree with holding anticoagulation and antiplatelets treatment at this point. There is no data available on the reversal of the pharmacologic effect of Prasugrel, but if needed, platelet transfusion or fresh frozen plasma may help. I would recommend consultation with the blood bank regarding the best PRBCs match for transfusion of this patient. The patient has no other clear hematologic etiology for his anemia. Continue evaluation and management by gastroenterology.  All questions were answered. The patient knows to call the clinic with any problems, questions or concerns. We can certainly see the patient much sooner if necessary.  Thank you so much for allowing me to participate in the care of AADARSH COZORT. I will continue to follow up the patient with you and assist in his care.  I spent 25 minutes counseling the patient face to face. The total time spent in the appointment was 50 minutes.  Sherria Riemann K. 07/04/2012, 3:54 PM

## 2012-07-04 NOTE — Progress Notes (Signed)
[facility] Daily Rounding Note 07/04/2012, 8:55 AM  SUBJECTIVE:       See notes mentioning pt possibly wanting transfer to [facility] for further GI testing, as they have capability of doing a duoble ballon enteroscopy.   One melenic stool this AM, none yesterday.  No dizzy, no chest pressure or pain.   OBJECTIVE:         Vital signs in last 24 hours:    Temp:  [98.1 F (36.7 C)-100 F (37.8 C)] 98.1 F (36.7 C) (03/24 0823) Pulse Rate:  [62-69] 64 (03/24 0344) Resp:  [15-23] 22 (03/24 0344) BP: (94-112)/(41-58) 112/55 mmHg (03/24 0344) SpO2:  [93 %-98 %] 97 % (03/24 0344) Weight:  [82.2 kg (181 lb 3.5 oz)] 82.2 kg (181 lb 3.5 oz) (03/24 0344) Last BM Date: 07/02/12 General: looks well.  comfortable   Heart: Irreg with harsh murmer Chest: clear B.  Not SOB Abdomen: soft, NT, ND.  No mass or HSM  Extremities: slightl pedal edema right > left Neuro/Psych:  Pleasant, no confusion.  No gross weakness or tremor  Intake/Output from previous day: 03/23 0701 - 03/24 0700 In: 400 [P.O.:400] Out: 0   Intake/Output this shift: Total I/O In: -  Out: 1 [Stool:1]  Lab Results:  Recent Labs  07/02/12 1240  07/03/12 0528 07/03/12 1520 07/04/12 0550  WBC 4.8  --   --  4.4 3.9*  HGB 7.7*  < > 6.3* 6.1* 5.4*  HCT 23.4*  < > 19.1* 18.4* 16.0*  PLT 218  --   --  191 156  < > = values in this interval not displayed. BMET  Recent Labs  07/02/12 1254 07/03/12 0303  NA 140 139  K 3.6 4.2  CL 95* 96  CO2  --  31  GLUCOSE 123* 102*  BUN 11 20  CREATININE 3.10* 5.01*  CALCIUM  --  9.5    PT/INR  Recent Labs  07/02/12 1240  LABPROT 14.2  INR 1.11    ASSESMENT: *  Recurrent acute blood loss on chronic multifactorial anemia in pt on Effient.  EGD and colonoscopy 12/20013 unrevealing (also in 04/2009) .  04/2010 capsule endo with SB erosions vs AVMs. Transfusion with blood problematic due to pt having multiple autoantibodies and hx transfusion reactions.  Hgb down 2.4  grams.   *  S/p multiple cardiac stents and angioplasties. Latest in 08/2011. *  ESRD *  Atrial fibrillation.      PLAN: *  Will allow renal diet as no plans for EGD until he gets blood.  *  Change to po Protonix.  *  Has hematology consult been called?    LOS: 2 days   Jennye Moccasin  07/04/2012, 8:55 AM Pager: [phone]  Pt got one unit of blood this afternoon.  Has CBC set for the AM.    Will give clears in AM to allow for possible EGD in afternoon.   GI ATTENDING  Patient's case reviews this morning in morning report with colleagues. History and physical personally performed. Agree with above as outlined. Laboratories and prior endoscopic assessments also reviewed. Patient has chronic anemia which is clearly multifactorial. GI contributions include chronic low-grade blood loss in the face of chronic Effient therapy. Recent colonoscopy and upper endoscopy unrevealing. Previous capsule endoscopy questions possible AVM or erosions. I suspect that GI blood losses her G-tube nonspecific mucosal oozing from Effient and/or AVMs. He has been transfused. He needs chronic iron. Agree with formal hematology evaluation to  see if there are any other treatments for his chronic anemia. We will plan upper endoscopy this week. If he were to have significant bleeding, and his upper endoscopy was normal, then repeat capsule endoscopy might be reasonable. Patient is high risk for endoscopic procedures given his comorbidities.  Wilhemina Bonito. Eda Keys., M.D. [facility] Division of Gastroenterology

## 2012-07-04 NOTE — Progress Notes (Signed)
Subjective: Feels a little better, got one unit of blood without side effects  Objective Vital signs in last 24 hours: Filed Vitals:   07/04/12 1529 07/04/12 1550 07/04/12 1610 07/04/12 1623  BP:    119/51  Pulse:  59 62 59  Temp: 98.7 F (37.1 C)   99.3 F (37.4 C)  TempSrc: Oral     Resp:  17 18 16   Height:      Weight:      SpO2:   98%    Weight change: 3 kg (6 lb 9.8 oz)  Intake/Output Summary (Last 24 hours) at 07/04/12 1756 Last data filed at 07/04/12 1310  Gross per 24 hour  Intake    600 ml  Output      1 ml  Net    599 ml   Labs: Basic Metabolic Panel:  Recent Labs Lab 07/02/12 1254 07/03/12 0303  NA 140 139  K 3.6 4.2  CL 95* 96  CO2  --  31  GLUCOSE 123* 102*  BUN 11 20  CREATININE 3.10* 5.01*  CALCIUM  --  9.5   Liver Function Tests: No results found for this basename: AST, ALT, ALKPHOS, BILITOT, PROT, ALBUMIN,  in the last 168 hours No results found for this basename: LIPASE, AMYLASE,  in the last 168 hours No results found for this basename: AMMONIA,  in the last 168 hours CBC:  Recent Labs Lab 07/02/12 1240  07/02/12 2259 07/03/12 0303 07/03/12 0528 07/03/12 1520 07/04/12 0550  WBC 4.8  --   --   --   --  4.4 3.9*  NEUTROABS 2.5  --   --   --   --   --   --   HGB 7.7*  < > 6.2* 6.4* 6.3* 6.1* 5.4*  HCT 23.4*  < > 18.6*  --  19.1* 18.4* 16.0*  MCV 113.6*  --   --   --   --  109.5* 111.9*  PLT 218  --   --   --   --  191 156  < > = values in this interval not displayed. PT/INR: @LABRCNTIP (inr:5)   Scheduled Meds ) . allopurinol  50 mg Oral BID  . amiodarone  200 mg Oral QHS  . atorvastatin  20 mg Oral q1800  . Chlorhexidine Gluconate Cloth  6 each Topical Q0600  . isosorbide mononitrate  30 mg Oral QHS  . metoprolol tartrate  25 mg Oral BID  . multivitamin  1 tablet Oral BH-q7a  . mupirocin ointment  1 application Nasal BID  . pantoprazole  40 mg Oral BID  . PARoxetine  20 mg Oral Daily  . sevelamer carbonate  2,400 mg Oral  TID WC  . sodium chloride  3 mL Intravenous Q12H    Physical Exam:  Blood pressure 119/51, pulse 59, temperature 99.3 F (37.4 C), temperature source Oral, resp. rate 16, height 5\' 10"  (1.778 m), weight 82.2 kg (181 lb 3.5 oz), SpO2 98.00%.  Gen: alert, no distress, cheerful Neck: no JVD  Resp: clear bilat  Cardio: RRR with Gr II/VI systolic murmur, no rub  GI: soft, non-tender; no ascites or HSM Ext: no edema bilaterally  Neuro: Grossly normal  Dialysis Access: AVF @ LUA with + bruit   Dialysis Orders: Center: [facility] on TTS.  EDW 78 kg HD Bath 2K/2.25Ca Time 3hrs 15 mins Heparin 0. Access AVF @ LUA BFR 450 DFR A2.0 Hectorol 1 mcg IV/HD Epogen 3800 Units IV/HD Venofer 0.  Assessment/Plan:  1. 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 Rec'd one unit prbc with no acute reaction 2. ESRD - HD on TTS in [place]; K 4.2. Next HD 3/25 3. Hypertension/volume - BP 94/51 on Metoprolol 25 mg bid; today's wt 84 kg, although EDW 78. 4. Anemia - Hgb down to 5's, +Ab's, transfused this afternoon without any acute reaction. Aranesp 200 mcg on 3/25, . 5. Metabolic bone disease - Ca 9.5, P4.1 on 3/14, iPTH 183 on 2/14; Hectorol 1 mcg, Renvela 3 with meals. 6. Nutrition - Last Alb 4, renal vitamin. 7. Paroxysmal atrial fib - HR controlled on Metoprolol, Coumadin on hold since 03/2012. 8. CAD - s/p coronary angioplasty with stent placement 2013, followed by Dr. [name]. 9. Aortic stenosis - moderate per echo 03/15/12. 10. Schatzki's ring - disrupted per EGD 03/2012.    Vinson Moselle  MD [phone] pgr    [phone] cell 07/04/2012, 5:56 PM

## 2012-07-04 NOTE — Consult Note (Signed)
Reason for Consult:  CAD Referring Physician:   CODEE TUTSON is an 71 y.o. male.  HPI:   The patient is a 72 yo caucasian male with a history of isch CM, HTN, HLD, PAF-previously on coumadin, ESRD-HD T,Th,S.  His last cardiac cath was 09/30/11.  At that time he received an Integrity DES to the mid LAD and PTCA to the diag 2.  He had widely patent stents in the mid to distal circumflex into the a large OM.     The patient presents with generalized weakness, DOE and black stools for the last two weeks.  Also he has been having a "soreness" from the right side of his neck down through the right side of his ribs for the last two to three days.  He just saw Dr. [name] on 3/21 but neglected to mention the black stools.  He denies N, V, fever, CP, Abd pain, dizziness, LEE.   Past Medical History  Diagnosis Date  . HTN (hypertension)   . Arthritis   . Hypercholesteremia   . MI (myocardial infarction) 03/16/2011    inferolateral ST segment elevation MI  . GERD (gastroesophageal reflux disease)   . Atrial fibrillation     Paroxysmal - 2 episodes related to ischemic NSTEMI events  5 & 09/2011  . Gout   . S/P coronary artery stent placement, 10/01/11 with DES to mid LAD and  PTCA of Diag 2 10/01/2009    Inferior STEMI 2011 - BMS to LCx; NSTEMI 5/13 with ISR/thrombosis of Cx BMS -->multiple DES to  LCx ; staged PCI of LAD /Balloon PTCA of major Diag 09/2011 for Sx (NSTEMI)   . Anticoagulated on warfarin 10/06/2011  . ESRD on hemodialysis     [facility], TTS, esrd due to HTN  . Heart murmur   . Anemia   . Schatzki's ring   . Angiodysplasia of intestine   . Aortic stenosis, moderate     2D Echocardiogram 12/13 and 08/22/11 showed moderate aortic stenosis with valve area of 1.28 sq cm. EF of 45 to 50% with severe hypokinesia of the entire inferolateral myocardium  . Right renal artery stenosis 1998    Past Surgical History  Procedure Laterality Date  . Ureteral stent placement    . Colonoscopy  04/2009     with EGD by Dr.Buccini moderately large hiatal hernia could contribute to his anemia,Schatzki ring which might accout for his intermittent dysphagia/+internal hemorrhoids ;family hx of colon ca  . Esophagogastroduodenoscopy  03/2010    by Dr.Magod/small hiatal hernia with widley patent fibrous ring,minimal bulbitis otherwise nl  . Dialysis fistula creation    . Coronary angioplasty with stent placement  08/2011 & 09/2011    Integrity Resolute DES Stents  Stent #1:  2.40mm x 14 mm (extending to just distal to the bifurcation to the mid OM, Stent #2:  2.74mm x 18mm  Stent #3:  3.64mm x 18mm; covering proximal portion of the old stent into the proximal circumflex and overlapping stent #2 distally, Stent #4: 2.7mm x 8mm (4 overlapping in LCx- OM1; 1 in mid LAD; PTCA only of Major Diag (small caliber)  . Coronary angioplasty with stent placement  2011, 2012    Dr [name]  03/16/11  re-cannulized totally occluded circumflex, stented with Integrity bare metal stent focal 90% stenosis in LAD and diagonal branch   . Givens capsule study  04/16/10    SB erosions vs. AVMs, non-specific distal SB inflammation in setting ASA, plavix, NSAIDs  .  Esophagogastroduodenoscopy  03/14/2012    Procedure: ESOPHAGOGASTRODUODENOSCOPY (EGD);  Surgeon: Iva Boop, MD;  Location: [facility] ENDOSCOPY;  Service: Endoscopy;  Laterality: N/A;  . Colonoscopy  03/16/2012    Procedure: COLONOSCOPY;  Surgeon: Iva Boop, MD;  Location: [facility] ENDOSCOPY;  Service: Endoscopy;  Laterality: N/A;  . Right renal artery, pta and stenting  08/22/1996    Dr [name], P-154 Palmaz stent mounted on a 6mm x 2cm PowerFlex and deployed in the renal ostium at 7 atmospheres  reduction of 95% lesion to 0% residual without dissection      Family History  Problem Relation Age of Onset  . Coronary artery disease Father   . Hypertension Mother   . Coronary artery disease Mother   . Colon cancer Brother 59    Social History:  reports that  he quit smoking about 20 years ago. His smoking use included Cigarettes. He has a 22.5 pack-year smoking history. His smokeless tobacco use includes Chew. He reports that he does not drink alcohol or use illicit drugs.  Allergies: No Known Allergies  Medications: Prior to Admission medications   Medication Sig Start Date End Date Taking? Authorizing Provider  allopurinol (ZYLOPRIM) 100 MG tablet Take 50 mg by mouth 2 (two) times daily.    Yes Historical Provider, MD  amiodarone (PACERONE) 200 MG tablet Take 200 mg by mouth at bedtime.  10/15/11 10/14/12 Yes Nada Boozer, NP  Cimetidine (TAGAMET PO) Take 1 tablet by mouth at bedtime as needed (OTC). For acid reflux.   Yes Historical Provider, MD  isosorbide mononitrate (IMDUR) 30 MG 24 hr tablet Take 30 mg by mouth at bedtime.   Yes Historical Provider, MD  metoprolol tartrate (LOPRESSOR) 25 MG tablet Take 25 mg by mouth 2 (two) times daily.   Yes Historical Provider, MD  multivitamin (RENA-VIT) TABS tablet Take 1 tablet by mouth every morning.    Yes Historical Provider, MD  pantoprazole (PROTONIX) 40 MG tablet Take 40 mg by mouth 2 (two) times daily.    Yes Historical Provider, MD  PARoxetine (PAXIL) 20 MG tablet Take 1 tablet (20 mg total) by mouth daily. 08/26/11 08/25/12 Yes Wilburt Finlay, PA-C  prasugrel (EFFIENT) 10 MG TABS Take 10 mg by mouth at bedtime.   Yes Historical Provider, MD  rosuvastatin (CRESTOR) 10 MG tablet Take 10 mg by mouth at bedtime.    Yes Historical Provider, MD  sevelamer (RENVELA) 800 MG tablet Take 2,400 mg by mouth 3 (three) times daily with meals.   Yes Historical Provider, MD  nitroGLYCERIN (NITROSTAT) 0.4 MG SL tablet Place 1 tablet (0.4 mg total) under the tongue every 5 (five) minutes as needed. 08/26/11   Wilburt Finlay, PA-C     Results for orders placed during the hospital encounter of 07/02/12 (from the past 48 hour(s))  MRSA PCR SCREENING     Status: Abnormal   Collection Time    07/02/12  3:16 PM      Result  Value Range   MRSA by PCR POSITIVE (*) NEGATIVE   Comment:            The GeneXpert MRSA Assay (FDA     approved for NASAL specimens     only), is one component of a     comprehensive MRSA colonization     surveillance program. It is not     intended to diagnose MRSA     infection nor to guide or     monitor treatment for  MRSA infections.     RESULT CALLED TO, READ BACK BY AND VERIFIED WITH:     Liam Graham RN AT 1610 07/02/12 BY WOOLLENK  PREPARE RBC (CROSSMATCH)     Status: None   Collection Time    07/02/12  4:00 PM      Result Value Range   Order Confirmation ORDER PROCESSED BY BLOOD BANK    HEMOGLOBIN AND HEMATOCRIT, BLOOD     Status: Abnormal   Collection Time    07/02/12 10:59 PM      Result Value Range   Hemoglobin 6.2 (*) 13.0 - 17.0 g/dL   Comment: REPEATED TO VERIFY     CRITICAL RESULT CALLED TO, READ BACK BY AND VERIFIED WITH:     MACK, B. RN [id number] EBANKS COLCLOUGH, S   HCT 18.6 (*) 39.0 - 52.0 %  HEMOGLOBIN     Status: Abnormal   Collection Time    07/03/12  3:03 AM      Result Value Range   Hemoglobin 6.4 (*) 13.0 - 17.0 g/dL   Comment: CRITICAL VALUE NOTED.  VALUE IS CONSISTENT WITH PREVIOUSLY REPORTED AND CALLED VALUE.  BASIC METABOLIC PANEL     Status: Abnormal   Collection Time    07/03/12  3:03 AM      Result Value Range   Sodium 139  135 - 145 mEq/L   Potassium 4.2  3.5 - 5.1 mEq/L   Chloride 96  96 - 112 mEq/L   CO2 31  19 - 32 mEq/L   Glucose, Bld 102 (*) 70 - 99 mg/dL   BUN 20  6 - 23 mg/dL   Creatinine, Ser 0.98 (*) 0.50 - 1.35 mg/dL   Comment: DELTA CHECK NOTED   Calcium 9.5  8.4 - 10.5 mg/dL   GFR calc non Af Amer 11 (*) >90 mL/min   GFR calc Af Amer 12 (*) >90 mL/min   Comment:            The eGFR has been calculated     using the CKD EPI equation.     This calculation has not been     validated in all clinical     situations.     eGFR's persistently     <90 mL/min signify     possible Chronic Kidney Disease.  HEMOGLOBIN  AND HEMATOCRIT, BLOOD     Status: Abnormal   Collection Time    07/03/12  5:28 AM      Result Value Range   Hemoglobin 6.3 (*) 13.0 - 17.0 g/dL   Comment: CRITICAL VALUE NOTED.  VALUE IS CONSISTENT WITH PREVIOUSLY REPORTED AND CALLED VALUE.   HCT 19.1 (*) 39.0 - 52.0 %  CBC     Status: Abnormal   Collection Time    07/03/12  3:20 PM      Result Value Range   WBC 4.4  4.0 - 10.5 K/uL   RBC 1.68 (*) 4.22 - 5.81 MIL/uL   Hemoglobin 6.1 (*) 13.0 - 17.0 g/dL   Comment: REPEATED TO VERIFY     CRITICAL VALUE NOTED.  VALUE IS CONSISTENT WITH PREVIOUSLY REPORTED AND CALLED VALUE.   HCT 18.4 (*) 39.0 - 52.0 %   MCV 109.5 (*) 78.0 - 100.0 fL   MCH 36.3 (*) 26.0 - 34.0 pg   MCHC 33.2  30.0 - 36.0 g/dL   RDW 11.9 (*) 14.7 - 82.9 %   Platelets 191  150 - 400 K/uL  CBC  Status: Abnormal   Collection Time    07/04/12  5:50 AM      Result Value Range   WBC 3.9 (*) 4.0 - 10.5 K/uL   RBC 1.43 (*) 4.22 - 5.81 MIL/uL   Hemoglobin 5.4 (*) 13.0 - 17.0 g/dL   Comment: CRITICAL VALUE NOTED.  VALUE IS CONSISTENT WITH PREVIOUSLY REPORTED AND CALLED VALUE.   HCT 16.0 (*) 39.0 - 52.0 %   MCV 111.9 (*) 78.0 - 100.0 fL   MCH 37.8 (*) 26.0 - 34.0 pg   MCHC 33.8  30.0 - 36.0 g/dL   RDW 19.1 (*) 47.8 - 29.5 %   Platelets 156  150 - 400 K/uL  VITAMIN B12     Status: None   Collection Time    07/04/12  5:50 AM      Result Value Range   Vitamin B-12 406  211 - 911 pg/mL  FOLATE     Status: None   Collection Time    07/04/12  5:50 AM      Result Value Range   Folate >20.0     Comment: (NOTE)     Reference Ranges            Deficient:       0.4 - 3.3 ng/mL            Indeterminate:   3.4 - 5.4 ng/mL            Normal:              > 5.4 ng/mL  IRON AND TIBC     Status: None   Collection Time    07/04/12  5:50 AM      Result Value Range   Iron 45  42 - 135 ug/dL   TIBC 621  308 - 657 ug/dL   Saturation Ratios 21  20 - 55 %   UIBC 173  125 - 400 ug/dL  FERRITIN     Status: Abnormal   Collection  Time    07/04/12  5:50 AM      Result Value Range   Ferritin 1045 (*) 22 - 322 ng/mL  RETICULOCYTES     Status: Abnormal   Collection Time    07/04/12  5:50 AM      Result Value Range   Retic Ct Pct 3.3 (*) 0.4 - 3.1 %   RBC. 1.43 (*) 4.22 - 5.81 MIL/uL   Retic Count, Manual 47.2  19.0 - 186.0 K/uL  GLUCOSE, CAPILLARY     Status: None   Collection Time    07/04/12 12:19 PM      Result Value Range   Glucose-Capillary 92  70 - 99 mg/dL   Comment 1 Documented in Chart     Comment 2 Notify RN      No results found.  Review of Systems  Constitutional: Negative for fever and diaphoresis.  HENT: Negative for congestion and sore throat.   Respiratory: Positive for shortness of breath (With exertion). Negative for cough.   Cardiovascular: Negative for chest pain, orthopnea, leg swelling and PND.  Gastrointestinal: Positive for melena. Negative for nausea, vomiting, abdominal pain, diarrhea and constipation.  Genitourinary: Negative for dysuria and hematuria.  Neurological: Positive for weakness. Negative for dizziness.   Blood pressure 101/45, pulse 59, temperature 98.6 F (37 C), temperature source Oral, resp. rate 18, height 5\' 10"  (1.778 m), weight 82.2 kg (181 lb 3.5 oz), SpO2 99.00%. Physical Exam  Constitutional: He is oriented to  person, place, and time. He appears well-developed and well-nourished. No distress.  HENT:  Head: Normocephalic and atraumatic.  Eyes: EOM are normal. Pupils are equal, round, and reactive to light. No scleral icterus.  Neck: Normal range of motion. Neck supple. No JVD present.  Cardiovascular: Normal rate, regular rhythm, S1 normal and S2 normal.   Murmur heard.  Systolic murmur is present with a grade of 2/6  Pulses:      Radial pulses are 2+ on the right side, and 2+ on the left side.       Dorsalis pedis pulses are 2+ on the right side, and 2+ on the left side.  No Carotid bruits.  Respiratory: Effort normal and breath sounds normal. No  respiratory distress. He has no wheezes. He has no rales.  GI: Soft. Bowel sounds are normal. There is no tenderness.  Musculoskeletal: He exhibits no edema.  Lymphadenopathy:    He has no cervical adenopathy.  Neurological: He is alert and oriented to person, place, and time. He exhibits normal muscle tone.  Skin: Skin is warm and dry.  Psychiatric: He has a normal mood and affect.   Murmur is SEM - crescendo/decrescendo 2-3/6 in RUSB radiating to carotids.  Assessment/Plan: Active Problems:   ANEMIA-UNSPECIFIED   CAD, MI CFX BMS 12/11 with ISR DES (as well as OM) May 2013   HTN (hypertension)   HLD (hyperlipidemia)   GERD (gastroesophageal reflux disease)   PAF (paroxysmal atrial fibrillation)   End stage renal disease on dialysis; HD T, Th Sat   Hypotension   Aortic stenosis, moderate, December 2013   Ischemic cardiomyopathy, EF 45-50% echo 12/13   H/O diastolic dysfunction   Upper GI bleeding  Plan:  He reports no CP with a Hgb of 5.4.   EKG does show new diffuse TWI.  Likely related to demand ischemia.  Effient has been stopped.  One unit PRBCs is being transfused currently.  BP and HR are stable.  Will follow a long.  Hematology has been consulted by primary service.    HAGER, BRYAN 07/04/2012, 2:15 PM   I have seen and evaluated the patient this PM along with  Wilburt Finlay, PA. I agree with his findings, examination as well as impression recommendations.  Mr. Souter presents an unfortunate challenge of CAD with DES stents (last in 09/2011) & pAFib (warfarin d/c'd in November for GI bleed) and recurrent GI Bleeds with no clear source.   At this point, we are obliged to hold all anticoagulation & antiplatelet agents to allow for hemostasis.  Despite concerning ST-T changes on ECG, he is not having any c/o angina or HF.    As he is ~9 months from DES stenting, he is likely close to being fully re-endothelialized (although the LCx-OM system does have several overlapping stents  required to revascularize the occluded initial BMS); unfortunately, his current drop in Hgb is significant & concerning.    Would hold Prasugrel for at least 1-2 weeks to allow for hemostasis.  Will need to reassess timing of & +/- restarting antiplatelet agents.  Can be decided as an OP by Dr. [name].    I spent over 20 minutes in consultation with the patient & family discussing the R/B/A/I of continuing vs stopping prasugrel.  They all seem to be in agreement & appreciate the explanation.  He remains in NSR on Amiodarone + BB -- BP is stable.  Continue Nitrate & Statin   [facility] will continue to follow along & be available.  Marykay Lex, M.D., M.S. [facility] [address] [place], [place]  [address]  [phone] Pager # [phone] 07/04/2012 4:15 PM

## 2012-07-04 NOTE — Progress Notes (Signed)
[facility] Progress Note Runge TEAM 1 - Stepdown/ICU TEAM   SAMBA CUMBA ZOX:[id number] DOB: [birth year] DOA: 07/02/2012 PCP: [name], MD  Brief narrative: 72 y.o. male with extensive past medical history - HTN, paroxysmal A fib, NSTEMI / CAD status post multiple stents (last in June 2013), ESRD on HD TTS, chronic systolic CHF, ischemic cardiomyopathy, prior GI bleeds which have been evaluated by multiple EGDs and colonoscopies. 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 He was transferred from dialysis unit secondary to generalized weakness, hemoglobin 7.2 g/dL and black stools. Patient gave a 2 week history of gradual onset and slowly worsening generalized weakness but he felt that his legs were going to buckle underneath him. He denied history of falls or syncope. No chest pain or dyspnea. No dizziness or lightheadedness. He gave history of one to 2 black color stools daily - no bright red blood in stools. Denied abdominal pain, nausea or vomiting. In the ED, hemoglobin 7.7. Patient was transiently hypotensive in the ED, improved with IV fluid bolus. Hospitalist service requested to admit for further evaluation and management.  Assessment/Plan:  Melena - Upper GI Bleeding - recurrent/chronic GI following - have been holding on any potential endoscopic procedures until hgb more stable  Acute on chronic anemia - blood loss in setting of anemia of chronic renal disease Baseline Hgb ~9 - unable to transfuse earlier due to difficulty with cross-reactivity - continue to follow clinically - hgb down to 5.4 and given his underlying CAD we'll proceed cautiously with transfusion of one unit of PRBC's - recheck CBC in AM so as to minimize phelbotomy, but sooner if BP drops/pt declines clinically  Multiple serum antibodies due to prior transfusions -have asked hematology to formally consult and make comment/suggestions  CAD w/ hx of MI s/p multiple PTCA + stenting  procedures (BMS and DES) Most recent procedure 10/01/2011 DES to mid LAD and PTCA of Diag 2 - asymptomatic at present - have asked Cardiology to see in consult  HTN  Not an active problem at the present time  HLD   GERD Well-controlled  PAF Currently in normal sinus rhythm - holding anticoagulation in setting of bleeding  End stage renal disease on dialysis; HD T, Th Sat Nephrology is following  Aortic stenosis, moderate, December 2013  Ischemic cardiomyopathy, EF 45-50% echo 12/13  +MRSA screen Usual protocol  Code Status: FULL Family Communication: Spoke with patient and wife at bedside Disposition Plan:  Step down unit  Consultants: Gastroenterology Nephrology  Procedures: None  Antibiotics: None  DVT prophylaxis: SCDs  HPI/Subjective: The patient is resting comfortably at the present time-sitting on the side of the bed.  He denies lightheadedness fevers chills chest pain or shortness of breath atbrest.  He denies abdominal pain.  He reports having a black tarry BM this morning.    Objective: Blood pressure 112/45, pulse 60, temperature 97.7 F (36.5 C), temperature source Oral, resp. rate 20, height 5\' 10"  (1.778 m), weight 82.2 kg (181 lb 3.5 oz), SpO2 99.00%.  Intake/Output Summary (Last 24 hours) at 07/04/12 1448 Last data filed at 07/04/12 1310  Gross per 24 hour  Intake    600 ml  Output      1 ml  Net    599 ml   Exam: General: No acute respiratory distress Lungs: Clear to auscultation bilaterally without wheezes or crackles Cardiovascular: Regular rate and rhythm without murmur gallop or rub normal S1 and S2 Abdomen: Nontender, nondistended, soft, bowel  sounds positive, no rebound, no ascites, no appreciable mass Extremities: No significant cyanosis, clubbing, or edema bilateral lower extremities  Data Reviewed: Basic Metabolic Panel:  Recent Labs Lab 07/02/12 1254 07/03/12 0303  NA 140 139  K 3.6 4.2  CL 95* 96  CO2  --  31  GLUCOSE  123* 102*  BUN 11 20  CREATININE 3.10* 5.01*  CALCIUM  --  9.5   CBC:  Recent Labs Lab 07/02/12 1240 07/02/12 1254 07/02/12 2259 07/03/12 0303 07/03/12 0528 07/03/12 1520 07/04/12 0550  WBC 4.8  --   --   --   --  4.4 3.9*  NEUTROABS 2.5  --   --   --   --   --   --   HGB 7.7* 8.2* 6.2* 6.4* 6.3* 6.1* 5.4*  HCT 23.4* 24.0* 18.6*  --  19.1* 18.4* 16.0*  MCV 113.6*  --   --   --   --  109.5* 111.9*  PLT 218  --   --   --   --  191 156    Recent Results (from the past 240 hour(s))  MRSA PCR SCREENING     Status: Abnormal   Collection Time    07/02/12  3:16 PM      Result Value Range Status   MRSA by PCR POSITIVE (*) NEGATIVE Final   Comment:            The GeneXpert MRSA Assay (FDA     approved for NASAL specimens     only), is one component of a     comprehensive MRSA colonization     surveillance program. It is not     intended to diagnose MRSA     infection nor to guide or     monitor treatment for     MRSA infections.     RESULT CALLED TO, READ BACK BY AND VERIFIED WITH:     Liam Graham RN AT 0454 07/02/12 BY Miquel Dunn     Studies:  Recent x-ray studies have been reviewed in detail by the Attending Physician  Scheduled Meds:  Scheduled Meds: . allopurinol  50 mg Oral BID  . amiodarone  200 mg Oral QHS  . atorvastatin  20 mg Oral q1800  . Chlorhexidine Gluconate Cloth  6 each Topical Q0600  . isosorbide mononitrate  30 mg Oral QHS  . metoprolol tartrate  25 mg Oral BID  . multivitamin  1 tablet Oral BH-q7a  . mupirocin ointment  1 application Nasal BID  . pantoprazole  40 mg Oral BID  . PARoxetine  20 mg Oral Daily  . sevelamer carbonate  2,400 mg Oral TID WC  . sodium chloride  3 mL Intravenous Q12H   Continuous Infusions:   Time spent on care of this patient:   ELLIS,ALLISON L.  [facility] Office  [phone] Pager - Text Page per Loretha Stapler as per below:  On-Call/Text Page:      [email or web address]      password [email or web address]  If 7PM-7AM,  please contact night-coverage [email or web address] Password [email or web address] 07/04/2012, 2:48 PM   LOS: 2 days   I have personally examined this patient and reviewed the entire database. I have reviewed the above note, made any necessary editorial changes, and agree with its content.  Lonia Blood, MD [facility]

## 2012-07-04 NOTE — Progress Notes (Signed)
Utilization review completed.  

## 2012-07-05 LAB — CBC
Platelets: 171 10*3/uL (ref 150–400)
RBC: 1.98 MIL/uL — ABNORMAL LOW (ref 4.22–5.81)
RDW: 21.6 % — ABNORMAL HIGH (ref 11.5–15.5)
WBC: 4.5 10*3/uL (ref 4.0–10.5)

## 2012-07-05 LAB — RENAL FUNCTION PANEL
Albumin: 3.3 g/dL — ABNORMAL LOW (ref 3.5–5.2)
CO2: 30 mEq/L (ref 19–32)
Chloride: 97 mEq/L (ref 96–112)
GFR calc Af Amer: 16 mL/min — ABNORMAL LOW (ref >90)
GFR calc non Af Amer: 14 mL/min — ABNORMAL LOW (ref >90)
Sodium: 138 mEq/L (ref 135–145)

## 2012-07-05 MED ORDER — LIDOCAINE-PRILOCAINE 2.5-2.5 % EX CREA
1.0000 "application " | TOPICAL_CREAM | CUTANEOUS | Status: DC | PRN
  Filled 2012-07-05: qty 5

## 2012-07-05 MED ORDER — ALTEPLASE 2 MG IJ SOLR
2.0000 mg | Freq: Once | INTRAMUSCULAR | Status: DC | PRN
  Filled 2012-07-05: qty 2

## 2012-07-05 MED ORDER — SODIUM CHLORIDE 0.9 % IV SOLN: 100.0000 mL | INTRAVENOUS | Status: DC | PRN

## 2012-07-05 MED ORDER — PENTAFLUOROPROP-TETRAFLUOROETH EX AERO: 1.0000 "application " | INHALATION_SPRAY | CUTANEOUS | Status: DC | PRN

## 2012-07-05 MED ORDER — HEPARIN SODIUM (PORCINE) 1000 UNIT/ML DIALYSIS
1000.0000 [IU] | INTRAMUSCULAR | Status: DC | PRN
  Filled 2012-07-05: qty 1

## 2012-07-05 MED ORDER — NEPRO/CARBSTEADY PO LIQD
237.0000 mL | ORAL | Status: DC | PRN
  Filled 2012-07-05: qty 237

## 2012-07-05 MED ORDER — LIDOCAINE HCL (PF) 1 % IJ SOLN: 5.0000 mL | INTRAMUSCULAR | Status: DC | PRN

## 2012-07-05 NOTE — Progress Notes (Signed)
Pt transferred to dialysis, pt expected to be there total of four hours. Report called to receiving nurse.

## 2012-07-05 NOTE — Progress Notes (Signed)
[facility] Progress Note [place] TEAM 1 - Stepdown/ICU TEAM   Roger [name]:[id number] DOB: Mar 30, 1941 DOA: 07/02/2012 PCP: [name], MD  Brief narrative: 72 y.o. male with extensive past medical history - HTN, paroxysmal A fib, NSTEMI / CAD status post multiple stents (last in June 2013), ESRD on HD TTS, chronic systolic CHF, ischemic cardiomyopathy, prior GI bleeds which have been evaluated by multiple EGDs and colonoscopies. 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 He was transferred from dialysis unit secondary to generalized weakness, hemoglobin 7.2 g/dL and black stools. Patient gave a 2 week history of gradual onset and slowly worsening generalized weakness but he felt that his legs were going to buckle underneath him. He denied history of falls or syncope. No chest pain or dyspnea. No dizziness or lightheadedness. He gave history of one to 2 black color stools daily - no bright red blood in stools. Denied abdominal pain, nausea or vomiting. In the ED, hemoglobin 7.7. Patient was transiently hypotensive in the ED, improved with IV fluid bolus. Hospitalist service requested to admit for further evaluation and management.  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 [name] in OP setting -Most recent procedure 10/01/2011 DES to mid LAD and PTCA of Diag 2 - asymptomatic at present  HTN  Not an active problem at the present time  HLD   GERD Well-controlled  PAF Currently in normal sinus rhythm - holding anticoagulation in setting of bleeding  End stage renal disease on dialysis; HD T, Th Sat Nephrology is following  Aortic stenosis, moderate, December 2013  Ischemic cardiomyopathy, EF 45-50% echo 12/13  +MRSA screen Usual protocol  Code Status: FULL Family Communication: Spoke with patient and wife at bedside Disposition Plan:  Step down unit  Consultants: Gastroenterology Nephrology  Procedures: None  Antibiotics: None  DVT prophylaxis: SCDs  HPI/Subjective: In no distress- hungry!! No weakness, CP/SOB. Sts feels better after PRBC's  Objective: Blood pressure 120/63, pulse 71, temperature 97.7 F (36.5 C), temperature source Oral, resp. rate 20, height 5\' 10"  (1.778 m), weight 79.7 kg (175 lb 11.3 oz), SpO2 95.00%.  Intake/Output Summary (Last 24 hours) at 07/05/12 1514 Last data filed at 07/05/12 0801  Gross per 24 hour  Intake      0 ml  Output      0 ml  Net      0 ml   Exam: General: No acute respiratory distress Lungs: Clear to auscultation bilaterally without wheezes or crackles Cardiovascular: Regular rate and rhythm without murmur gallop or rub, grade 2-3/6 systolic murmur, no JVD or edema Abdomen: Nontender, nondistended, soft, bowel sounds positive, no rebound, no ascites, no appreciable mass Extremities: No significant cyanosis, clubbing, or edema bilateral lower extremities  Data Reviewed: Basic Metabolic  Panel:  Recent Labs Lab 07/02/12 1254 07/03/12 0303  NA 140 139  K 3.6 4.2  CL 95* 96  CO2  --  31  GLUCOSE 123* 102*  BUN 11 20  CREATININE 3.10* 5.01*  CALCIUM  --  9.5   CBC:  Recent Labs Lab  07/02/12 1240  07/02/12 2259 07/03/12 0303 07/03/12 0528 07/03/12 1520 07/04/12 0550 07/05/12 0438  WBC 4.8  --   --   --   --  4.4 3.9* 4.5  NEUTROABS 2.5  --   --   --   --   --   --   --   HGB 7.7*  < > 6.2* 6.4* 6.3* 6.1* 5.4* 7.1*  HCT 23.4*  < > 18.6*  --  19.1* 18.4* 16.0* 20.8*  MCV 113.6*  --   --   --   --  109.5* 111.9* 105.1*  PLT 218  --   --   --   --  191 156 171  < > = values in this interval not displayed.  Recent Results (from the past 240 hour(s))  MRSA PCR SCREENING     Status: Abnormal   Collection Time    07/02/12  3:16 PM      Result Value Range Status   MRSA by PCR POSITIVE (*) NEGATIVE Final   Comment:            The GeneXpert MRSA Assay (FDA     approved for NASAL specimens     only), is one component of a     comprehensive MRSA colonization     surveillance program. It is not     intended to diagnose MRSA     infection nor to guide or     monitor treatment for     MRSA infections.     RESULT CALLED TO, READ BACK BY AND VERIFIED WITH:     Liam Graham RN AT 0865 07/02/12 BY Miquel Dunn     Studies:  Recent x-ray studies have been reviewed in detail by the Attending Physician  Scheduled Meds:  Scheduled Meds: . allopurinol  50 mg Oral BID  . amiodarone  200 mg Oral QHS  . atorvastatin  20 mg Oral q1800  . Chlorhexidine Gluconate Cloth  6 each Topical Q0600  . isosorbide mononitrate  30 mg Oral QHS  . metoprolol tartrate  25 mg Oral BID  . multivitamin  1 tablet Oral BH-q7a  . mupirocin ointment  1 application Nasal BID  . pantoprazole  40 mg Oral BID  . PARoxetine  20 mg Oral Daily  . sevelamer carbonate  2,400 mg Oral TID WC  . sodium chloride  3 mL Intravenous Q12H   Continuous Infusions:   Time spent on care of this patient:   Algernon Huxley  [facility] Office  [phone] Pager - Text Page per Loretha Stapler as per below:  On-Call/Text Page:      [email or web address]      password [email or web address]  If 7PM-7AM, please contact  night-coverage [email or web address] Password [facility] 07/05/2012, 3:14 PM   LOS: 3 days       I have examined the patient, reviewed the chart and modified the above note which I agree with.   Emerald Shor,MD [phone] 07/05/2012, 6:46 PM

## 2012-07-05 NOTE — Progress Notes (Signed)
     [facility] Daily Rounding Note 07/05/2012, 8:38 AM  SUBJECTIVE:       Black stool one yest one this AM.  On clears.  Not dizzy, no SOB or chest/abd pain.  Feels well.  Not even complaining about clear liquids  OBJECTIVE:         Vital signs in last 24 hours:    Temp:  [97.7 F (36.5 C)-99.4 F (37.4 C)] 98.8 F (37.1 C) (03/25 0801) Pulse Rate:  [59-67] 61 (03/25 0400) Resp:  [16-25] 17 (03/25 0400) BP: (96-119)/(45-65) 118/60 mmHg (03/25 0400) SpO2:  [90 %-99 %] 90 % (03/25 0400) Weight:  [85.1 kg (187 lb 9.8 oz)] 85.1 kg (187 lb 9.8 oz) (03/25 0435) Last BM Date: 07/02/12 General: looks well    Heart: RRR Chest: clear.  No resp distress Abdomen: soft, NT, ND.  BS active  Extremities: no edema Neuro/Psych:  No confusion, relaxed, appropriate.   Intake/Output from previous day: 03/24 0701 - 03/25 0700 In: 600 [I.V.:250; Blood:350] Out: 1 [Stool:1]  Intake/Output this shift:    Lab Results:  Recent Labs  07/03/12 1520 07/04/12 0550 07/05/12 0438  WBC 4.4 3.9* 4.5  HGB 6.1* 5.4* 7.1*  HCT 18.4* 16.0* 20.8*  PLT 191 156 171   BMET  Recent Labs  07/02/12 1254 07/03/12 0303  NA 140 139  K 3.6 4.2  CL 95* 96  CO2  --  31  GLUCOSE 123* 102*  BUN 11 20  CREATININE 3.10* 5.01*  CALCIUM  --  9.5   LFT No results found for this basename: PROT, ALBUMIN, AST, ALT, ALKPHOS, BILITOT, BILIDIR, IBILI,  in the last 72 hours PT/INR  Recent Labs  07/02/12 1240  LABPROT 14.2  INR 1.11   ASSESMENT: * Recurrent acute blood loss on chronic multifactorial anemia in pt on Effient.  EGD and colonoscopy 12/20013 unrevealing (also in 04/2009) . 04/2010 capsule endo with SB erosions vs AVMs.  Transfusion with blood problematic due to pt having multiple autoantibodies and hx transfusion reactions.  Was able to get additional  Unit of blood on 3/24 and Hgb has bumped up appropriately.   Hgb down 2.4 grams.  * S/p multiple cardiac stents and angioplasties. Latest in  08/2011.  * ESRD  * Atrial fibrillation.    PLAN: *  EGD tomorrow.  Dialysis today. Solid diet today     LOS: 3 days   Jennye Moccasin  07/05/2012, 8:38 AM Pager: [phone]  GI ATTENDING  Interval history and laboratories reviewed. Hematology note reviewed. I talked with Dr. Arbutus Ped. Patient is clinically stable. Only received one unit of blood. He probably would benefit from additional blood, assuming the blood bank and help with appropriate match. I will leave this to the primary service to execute. Dr. Arbutus Ped feels that long-term, growth factors would be helpful for his bone marrow given the likelihood of superimposed anemia of chronic disease. This would be administered at the time of dialysis, I am told. I certainly feel that his anemia is out of proportion to anemia from GI bleeding only. Additionally, suggestions were made regarding his chronic anticoagulation therapy. That risk-benefit ratio issue needs to be bothered by his prescribing physician. Obviously, if reasonable, decrease in anticoagulation therapy may be helpful in terms of slowing his anemia. We are planning upper endoscopy tomorrow to rule out any significant interval lesions since his last exam in December.  Wilhemina Bonito. Eda Keys., M.D. [facility] Division of Gastroenterology

## 2012-07-05 NOTE — Progress Notes (Signed)
Subjective: no complaints  Objective Vital signs in last 24 hours: Filed Vitals:   07/05/12 0000 07/05/12 0400 07/05/12 0435 07/05/12 0801  BP: 119/57 118/60    Pulse: 64 61    Temp: 98.8 F (37.1 C) 98.5 F (36.9 C)  98.8 F (37.1 C)  TempSrc: Oral Oral  Oral  Resp: 19 17    Height:      Weight:   85.1 kg (187 lb 9.8 oz)   SpO2: 91% 90%     Weight change: 2.9 kg (6 lb 6.3 oz)  Intake/Output Summary (Last 24 hours) at 07/05/12 1051 Last data filed at 07/05/12 0801  Gross per 24 hour  Intake    600 ml  Output      0 ml  Net    600 ml   Labs: Basic Metabolic Panel:  Recent Labs Lab 07/02/12 1254 07/03/12 0303  NA 140 139  K 3.6 4.2  CL 95* 96  CO2  --  31  GLUCOSE 123* 102*  BUN 11 20  CREATININE 3.10* 5.01*  CALCIUM  --  9.5   Liver Function Tests: No results found for this basename: AST, ALT, ALKPHOS, BILITOT, PROT, ALBUMIN,  in the last 168 hours No results found for this basename: LIPASE, AMYLASE,  in the last 168 hours No results found for this basename: AMMONIA,  in the last 168 hours CBC:  Recent Labs Lab 07/02/12 1240  07/03/12 0528 07/03/12 1520 07/04/12 0550 07/05/12 0438  WBC 4.8  --   --  4.4 3.9* 4.5  NEUTROABS 2.5  --   --   --   --   --   HGB 7.7*  < > 6.3* 6.1* 5.4* 7.1*  HCT 23.4*  < > 19.1* 18.4* 16.0* 20.8*  MCV 113.6*  --   --  109.5* 111.9* 105.1*  PLT 218  --   --  191 156 171  < > = values in this interval not displayed. PT/INR: @LABRCNTIP (inr:5)   Scheduled Meds ) . allopurinol  50 mg Oral BID  . amiodarone  200 mg Oral QHS  . atorvastatin  20 mg Oral q1800  . Chlorhexidine Gluconate Cloth  6 each Topical Q0600  . isosorbide mononitrate  30 mg Oral QHS  . metoprolol tartrate  25 mg Oral BID  . multivitamin  1 tablet Oral BH-q7a  . mupirocin ointment  1 application Nasal BID  . pantoprazole  40 mg Oral BID  . PARoxetine  20 mg Oral Daily  . sevelamer carbonate  2,400 mg Oral TID WC  . sodium chloride  3 mL  Intravenous Q12H    Physical Exam:  Blood pressure 118/60, pulse 61, temperature 98.8 F (37.1 C), temperature source Oral, resp. rate 17, height 5\' 10"  (1.778 m), weight 85.1 kg (187 lb 9.8 oz), SpO2 90.00%.  Gen: alert, no distress, cheerful Neck: no JVD  Resp: clear bilat  Cardio: RRR with Gr II/VI systolic murmur, no rub  GI: soft, non-tender; no ascites or HSM Ext: no edema bilaterally  Neuro: Grossly normal  Dialysis Access: AVF @ LUA with + bruit   Dialysis Orders: Center: [facility] on TTS.  EDW 78 kg HD Bath 2K/2.25Ca Time 3hrs 15 mins Heparin 0. Access AVF @ LUA BFR 450 DFR A2.0 Hectorol 1 mcg IV/HD Epogen 3800 Units IV/HD Venofer 0.   Assessment/Plan:  1. 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 Rec'd one unit prbc 3/24 with no acute reaction 2. ESRD -  HD on TTS in [place]; K 4.2. Next HD 3/25 3. Hypertension/volume - BP 94/51 on Metoprolol 25 mg bid; bed scales are off, standing scale 79.8kg with EDW 78. UF 2 kg as tolerated with HD today 4. Anemia - Hgb got down to 5's, +Ab's, transfused one unit 3/24. S/p Aranesp 200 mcg on 3/25 5. Metabolic bone disease - Ca 9.5, P4.1 on 3/14, iPTH 183 on 2/14; Hectorol 1 mcg, Renvela 3 with meals. 6. Nutrition - Last Alb 4, renal vitamin. 7. Paroxysmal atrial fib - HR controlled on Metoprolol, Coumadin on hold since 03/2012. 8. CAD - s/p coronary angioplasty with stent placement 2013, followed by Dr. [name]. 9. Aortic stenosis - moderate per echo 03/15/12. 10. Schatzki's ring - disrupted per EGD 03/2012.    Roger Moselle  MD [phone] pgr    [phone] cell 07/05/2012, 10:51 AM

## 2012-07-06 ENCOUNTER — Encounter (HOSPITAL_COMMUNITY)
Admission: EM | Disposition: A | Discharge status: Home / Self Care | Payer: Self-pay | Source: Home / Self Care | Attending: Internal Medicine

## 2012-07-06 ENCOUNTER — Encounter (HOSPITAL_COMMUNITY): Payer: Self-pay

## 2012-07-06 DIAGNOSIS — K5521 Angiodysplasia of colon with hemorrhage: Principal | ICD-10-CM

## 2012-07-06 HISTORY — PX: ENTEROSCOPY: SHX5533

## 2012-07-06 LAB — TYPE AND SCREEN
ABO/RH(D): O POS
Donor AG Type: NEGATIVE
Unit division: 0
Unit division: 0

## 2012-07-06 LAB — CBC
Hemoglobin: 7 g/dL — ABNORMAL LOW (ref 13.0–17.0)
MCHC: 34 g/dL (ref 30.0–36.0)
Platelets: 199 10*3/uL (ref 150–400)
RBC: 1.92 MIL/uL — ABNORMAL LOW (ref 4.22–5.81)

## 2012-07-06 SURGERY — ENTEROSCOPY
Anesthesia: Moderate Sedation

## 2012-07-06 MED ORDER — EPINEPHRINE HCL 0.1 MG/ML IJ SOLN
INTRAMUSCULAR | Status: AC
  Filled 2012-07-06: qty 10

## 2012-07-06 MED ORDER — DARBEPOETIN ALFA-POLYSORBATE 200 MCG/0.4ML IJ SOLN
200.0000 ug | INTRAMUSCULAR | Status: DC
  Administered 2012-07-07: 200 ug via INTRAVENOUS

## 2012-07-06 MED ORDER — DIPHENHYDRAMINE HCL 50 MG/ML IJ SOLN
25.0000 mg | Freq: Once | INTRAMUSCULAR | Status: DC
  Filled 2012-07-06: qty 1

## 2012-07-06 MED ORDER — FENTANYL CITRATE 0.05 MG/ML IJ SOLN
INTRAMUSCULAR | Status: AC
  Filled 2012-07-06: qty 2

## 2012-07-06 MED ORDER — SODIUM CHLORIDE 0.9 % IV SOLN: 100.0000 mL | INTRAVENOUS | Status: DC | PRN

## 2012-07-06 MED ORDER — SODIUM CHLORIDE 0.9 % IJ SOLN
INTRAMUSCULAR | Status: DC | PRN
  Administered 2012-07-06: 12:00:00

## 2012-07-06 MED ORDER — FENTANYL CITRATE 0.05 MG/ML IJ SOLN
INTRAMUSCULAR | Status: DC | PRN
  Administered 2012-07-06 (×3): 25 ug via INTRAVENOUS

## 2012-07-06 MED ORDER — HEPARIN SODIUM (PORCINE) 1000 UNIT/ML DIALYSIS
1000.0000 [IU] | INTRAMUSCULAR | Status: DC | PRN
  Filled 2012-07-06: qty 1

## 2012-07-06 MED ORDER — LIDOCAINE-PRILOCAINE 2.5-2.5 % EX CREA
1.0000 "application " | TOPICAL_CREAM | CUTANEOUS | Status: DC | PRN
  Filled 2012-07-06: qty 5

## 2012-07-06 MED ORDER — PENTAFLUOROPROP-TETRAFLUOROETH EX AERO: 1.0000 "application " | INHALATION_SPRAY | CUTANEOUS | Status: DC | PRN

## 2012-07-06 MED ORDER — ALTEPLASE 2 MG IJ SOLR
2.0000 mg | Freq: Once | INTRAMUSCULAR | Status: AC | PRN
  Filled 2012-07-06: qty 2

## 2012-07-06 MED ORDER — LIDOCAINE HCL (PF) 1 % IJ SOLN: 5.0000 mL | INTRAMUSCULAR | Status: DC | PRN

## 2012-07-06 MED ORDER — GLUCAGON HCL (RDNA) 1 MG IJ SOLR
INTRAMUSCULAR | Status: DC | PRN
  Administered 2012-07-06: .5 mg via INTRAVENOUS

## 2012-07-06 MED ORDER — DIPHENHYDRAMINE HCL 50 MG/ML IJ SOLN
25.0000 mg | Freq: Once | INTRAMUSCULAR | Status: AC
  Administered 2012-07-06: 25 mg via INTRAVENOUS

## 2012-07-06 MED ORDER — MIDAZOLAM HCL 10 MG/2ML IJ SOLN
INTRAMUSCULAR | Status: DC | PRN
  Administered 2012-07-06 (×3): 2 mg via INTRAVENOUS
  Administered 2012-07-06 (×2): 1 mg via INTRAVENOUS

## 2012-07-06 MED ORDER — BUTAMBEN-TETRACAINE-BENZOCAINE 2-2-14 % EX AERO
INHALATION_SPRAY | CUTANEOUS | Status: DC | PRN
  Administered 2012-07-06: 2 via TOPICAL

## 2012-07-06 MED ORDER — MIDAZOLAM HCL 5 MG/ML IJ SOLN
INTRAMUSCULAR | Status: AC
  Filled 2012-07-06: qty 2

## 2012-07-06 MED ORDER — SODIUM CHLORIDE 0.9 % IV SOLN
125.0000 mg | INTRAVENOUS | Status: DC
  Administered 2012-07-07: 125 mg via INTRAVENOUS
  Filled 2012-07-06 (×2): qty 10

## 2012-07-06 MED ORDER — GLUCAGON HCL (RDNA) 1 MG IJ SOLR
INTRAMUSCULAR | Status: AC
  Filled 2012-07-06: qty 1

## 2012-07-06 MED ORDER — ACETAMINOPHEN 325 MG PO TABS
650.0000 mg | ORAL_TABLET | Freq: Once | ORAL | Status: AC
  Administered 2012-07-06: 650 mg via ORAL

## 2012-07-06 MED ORDER — NEPRO/CARBSTEADY PO LIQD
237.0000 mL | ORAL | Status: DC | PRN
  Filled 2012-07-06: qty 237

## 2012-07-06 MED ORDER — DARBEPOETIN ALFA-POLYSORBATE 200 MCG/0.4ML IJ SOLN
200.0000 ug | Freq: Once | INTRAMUSCULAR | Status: DC
  Filled 2012-07-06: qty 0.4

## 2012-07-06 NOTE — Op Note (Signed)
[facility] [address] [place], [address]   OPERATIVE PROCEDURE REPORT  PATIENT: Roger [name], Roger [name]  MR#: [id number] BIRTHDATE: 1941/02/17 , 71  yrs. old GENDER: Male ENDOSCOPIST: Roxy Cedar, MD REFERRED BY:  [facility] PROCEDURE DATE: 07/06/2012 PROCEDURE:   Small bowel enteroscopy with control of bleeding ASA CLASS:   Class III INDICATIONS:1.  melenic bleeding. MEDICATIONS: Fentanyl 75 mcg IV and Versed 8 mg IV , glucagon 0.5 mg, epinephrine injection concentration 1 10,000  --1 cc TOPICAL ANESTHETIC:   Cetacaine Spray  DESCRIPTION OF PROCEDURE:   After the risks benefits and alternatives of the procedure were thoroughly explained, informed consent was obtained.  The [id number] ([id number])  endoscope was introduced through the mouth  and advanced to the jejunum , limited by Without limitations.   The instrument was slowly withdrawn as the mucosa was fully examined.    EXAM: The esophagus, stomach, and duodenum were normal. In the proximal jejunum was an actively bleeding site. This was irrigated multiple times with recurrent oozing of the mucosa in that region. This either represented nonspecific mucosal defect or AVM. The APC probe was not long enough for the enteroscope. The lesion was successfully clipped with an Endo Clip (initial 2 clips failed deployment). Hemostasis was achieved. 1 cc of epinephrine was injected as well. The patient tolerated the procedure well.a retroflexed view revealed no abnormalities.    The scope was then withdrawn from the patient and the procedure terminated.  COMPLICATIONS: There were no complications. ENDOSCOPIC IMPRESSION: 1. Actively bleeding jejunal AVM status post successful Endo Clip/epinephrine injection  RECOMMENDATIONS: 1. Please try to transfuse this patient an additional 1 or 2 units of blood, when safe blood available 2. Liquids 3. Monitor H&H as well as stools  REPEAT  EXAM:  _______________________________ eSignedRoxy Cedar, MD 07/06/2012 12:37 PM   MV:HQION Renard Matter, MD [name], MD, Stan Head, M.D., The Patient

## 2012-07-06 NOTE — Progress Notes (Signed)
Subjective: no complaints  Objective Vital signs in last 24 hours: Filed Vitals:   07/06/12 1223 07/06/12 1235 07/06/12 1240 07/06/12 1250  BP: 157/67  107/52 107/52  Pulse:  63    Temp:  98 F (36.7 C)    TempSrc:  Oral    Resp: 23 19 29 21   Height:      Weight:      SpO2: 94%  94% 97%   Weight change: -5.3 kg (-11 lb 11 oz)  Intake/Output Summary (Last 24 hours) at 07/06/12 1308 Last data filed at 07/06/12 0746  Gross per 24 hour  Intake    360 ml  Output   2111 ml  Net  -1751 ml   Labs: Basic Metabolic Panel:  Recent Labs Lab 07/02/12 1254 07/03/12 0303 07/05/12 1749  NA 140 139 138  K 3.6 4.2 3.9  CL 95* 96 97  CO2  --  31 30  GLUCOSE 123* 102* 138*  BUN 11 20 18   CREATININE 3.10* 5.01* 4.02*  CALCIUM  --  9.5 9.8  PHOS  --   --  2.7   Liver Function Tests:  Recent Labs Lab 07/05/12 1749  ALBUMIN 3.3*   No results found for this basename: LIPASE, AMYLASE,  in the last 168 hours No results found for this basename: AMMONIA,  in the last 168 hours CBC:  Recent Labs Lab 07/02/12 1240  07/03/12 1520 07/04/12 0550 07/05/12 0438 07/06/12 0455  WBC 4.8  --  4.4 3.9* 4.5 3.8*  NEUTROABS 2.5  --   --   --   --   --   HGB 7.7*  < > 6.1* 5.4* 7.1* 7.0*  HCT 23.4*  < > 18.4* 16.0* 20.8* 20.6*  MCV 113.6*  --  109.5* 111.9* 105.1* 107.3*  PLT 218  --  191 156 171 199  < > = values in this interval not displayed. PT/INR: @LABRCNTIP (inr:5)   Scheduled Meds ) . acetaminophen  650 mg Oral Once  . [facility] HOLD] allopurinol  50 mg Oral BID  . [facility] HOLD] amiodarone  200 mg Oral QHS  . [facility] HOLD] atorvastatin  20 mg Oral q1800  . [MAR HOLD] Chlorhexidine Gluconate Cloth  6 each Topical Q0600  . [MAR HOLD] darbepoetin (ARANESP) injection - DIALYSIS  200 mcg Intravenous Once  . [MAR HOLD] darbepoetin (ARANESP) injection - DIALYSIS  200 mcg Intravenous Q Thu-HD  . diphenhydrAMINE  25 mg Intravenous Once  . [facility] HOLD] ferric gluconate (FERRLECIT/NULECIT) IV   125 mg Intravenous Q T,Th,Sa-HD  . [MAR HOLD] isosorbide mononitrate  30 mg Oral QHS  . [facility] HOLD] metoprolol tartrate  25 mg Oral BID  . [facility] HOLD] multivitamin  1 tablet Oral BH-q7a  . [facility] HOLD] mupirocin ointment  1 application Nasal BID  . [MAR HOLD] pantoprazole  40 mg Oral BID  . [MAR HOLD] PARoxetine  20 mg Oral Daily  . [facility] HOLD] sevelamer carbonate  2,400 mg Oral TID WC  . [MAR HOLD] sodium chloride  3 mL Intravenous Q12H    Physical Exam:  Blood pressure 107/52, pulse 63, temperature 98 F (36.7 C), temperature source Oral, resp. rate 21, height 5\' 10"  (1.778 m), weight 81.5 kg (179 lb 10.8 oz), SpO2 97.00%.  Gen: alert, no distress, cheerful Neck: no JVD  Resp: clear bilat  Cardio: RRR with Gr II/VI systolic murmur, no rub  GI: soft, non-tender; no ascites or HSM Ext: no edema bilaterally  Neuro: Grossly normal  Dialysis Access:  AVF @ LUA with + bruit   Dialysis Orders: Center: [facility] on TTS.  EDW 78 kg HD Bath 2K/2.25Ca Time 3hrs 15 mins Heparin 0. Access AVF @ LUA BFR 450 DFR A2.0 Hectorol 1 mcg IV/HD Epogen 3800 Units IV/HD Venofer 0.   Assessment/Plan:  1. GI bleed, recurrent / anemia of ABL- jejunal AVM clipped today per GI. Also SB AVM's in 2011. Rec'd one unit prbc 3/24, to get one unit today (3/26), will transfuse a second unit tomorrow with dialysis.  2. ESRD - HD on TTS in [place]; K 4.2. Next HD 3/27 3. Hypertension/volume - BP 94/51 on Metoprolol 25 mg bid; bed scales are off. 77kg after 2L off yesterday, plan HD to 77kg tomorrow 4. Anemia / CKD- +Ab's, transfuse prn. Max darbepoetin 200 ug today, then weekly while in hospital, give 500mg  IV Fe over HDx4 (tsat 21%) 5. Metabolic bone disease - Ca 9.5, P4.1 on 3/14, iPTH 183 on 2/14; Hectorol 1 mcg, Renvela 3 with meals. 6. Nutrition - Last Alb 4, renal vitamin. 7. Paroxysmal atrial fib - HR controlled on Metoprolol, Coumadin on hold since 03/2012. 8. CAD - s/p coronary angioplasty with stent placement 2013,  followed by Dr. [name]. 9. Aortic stenosis - moderate per echo 03/15/12. 10. Schatzki's ring - disrupted per EGD 03/2012.    Roger Moselle  MD [phone] pgr    [phone] cell 07/06/2012, 1:08 PM

## 2012-07-06 NOTE — H&P (View-Only) (Signed)
     Roger Roger [name] Daily Rounding Note 07/05/2012, 8:38 AM  SUBJECTIVE:       Black stool one yest one this AM.  On clears.  Not dizzy, no SOB or chest/abd pain.  Feels well.  Not even complaining about clear liquids  OBJECTIVE:         Vital signs in last 24 hours:    Temp:  [97.7 F (36.5 C)-99.4 F (37.4 C)] 98.8 F (37.1 C) (03/25 0801) Pulse Rate:  [59-67] 61 (03/25 0400) Resp:  [16-25] 17 (03/25 0400) BP: (96-119)/(45-65) 118/60 mmHg (03/25 0400) SpO2:  [90 %-99 %] 90 % (03/25 0400) Weight:  [85.1 kg (187 lb 9.8 oz)] 85.1 kg (187 lb 9.8 oz) (03/25 0435) Last BM Date: 07/02/12 General: looks well    Heart: RRR Chest: clear.  No resp distress Abdomen: soft, NT, ND.  BS active  Extremities: no edema Neuro/Psych:  No confusion, relaxed, appropriate.   Intake/Output from previous day: 03/24 0701 - 03/25 0700 In: 600 [I.V.:250; Blood:350] Out: 1 [Stool:1]  Intake/Output this shift:    Lab Results:  Recent Labs  07/03/12 1520 07/04/12 0550 07/05/12 0438  WBC 4.4 3.9* 4.5  HGB 6.1* 5.4* 7.1*  HCT 18.4* 16.0* 20.8*  PLT 191 156 171   BMET  Recent Labs  07/02/12 1254 07/03/12 0303  NA 140 139  K 3.6 4.2  CL 95* 96  CO2  --  31  GLUCOSE 123* 102*  BUN 11 20  CREATININE 3.10* 5.01*  CALCIUM  --  9.5   LFT No results found for this basename: PROT, ALBUMIN, AST, ALT, ALKPHOS, BILITOT, BILIDIR, IBILI,  in the last 72 hours PT/INR  Recent Labs  07/02/12 1240  LABPROT 14.2  INR 1.11   ASSESMENT: * Recurrent acute blood loss on chronic multifactorial anemia in pt on Effient.  EGD and colonoscopy 12/20013 unrevealing (also in 04/2009) . 04/2010 capsule endo with SB erosions vs AVMs.  Transfusion with blood problematic due to pt having multiple autoantibodies and hx transfusion reactions.  Was able to get additional  Unit of blood on 3/24 and Hgb has bumped up appropriately.   Hgb down 2.4 grams.  * S/p multiple cardiac stents and angioplasties. Latest in  08/2011.  * ESRD  * Atrial fibrillation.    PLAN: *  EGD tomorrow.  Dialysis today. Solid diet today     LOS: 3 days   Roger Roger [name]  07/05/2012, 8:38 AM Pager: [phone]  Roger [name] ATTENDING  Interval history and laboratories reviewed. Hematology note reviewed. I talked with Dr. Mohamed. Patient is clinically stable. Only received one unit of blood. He probably would benefit from additional blood, assuming the blood bank and help with appropriate match. I will leave this to the primary service to execute. Dr. Mohamed feels that long-term, growth factors would be helpful for his bone marrow given the likelihood of superimposed anemia of chronic disease. This would be administered at the time of dialysis, I am told. I certainly feel that his anemia is out of proportion to anemia from Roger [name] bleeding only. Additionally, suggestions were made regarding his chronic anticoagulation therapy. That risk-benefit ratio issue needs to be bothered by his prescribing physician. Obviously, if reasonable, decrease in anticoagulation therapy may be helpful in terms of slowing his anemia. We are planning upper endoscopy tomorrow to rule out any significant interval lesions since his last exam in December.  Roger Roger [name], Jr., M.D. [facility] Division of Gastroenterology 

## 2012-07-06 NOTE — Progress Notes (Signed)
[facility] Progress Note [place] TEAM 1 - Stepdown/ICU TEAM   VRAJ DENARDO ZOX:[id number] DOB: 1940/05/22 DOA: 07/02/2012 PCP: [name], MD  Brief narrative: 72 y.o. male with extensive past medical history - HTN, paroxysmal A fib, NSTEMI / CAD status post multiple stents (last in June 2013), ESRD on HD TTS, chronic systolic CHF, ischemic cardiomyopathy, prior GI bleeds which have been evaluated by multiple EGDs and colonoscopies. 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 He was transferred from dialysis unit secondary to generalized weakness, hemoglobin 7.2 g/dL and black stools. Patient gave a 2 week history of gradual onset and slowly worsening generalized weakness but he felt that his legs were going to buckle underneath him. He denied history of falls or syncope. No chest pain or dyspnea. No dizziness or lightheadedness. He gave history of one to 2 black color stools daily - no bright red blood in stools. Denied abdominal pain, nausea or vomiting. In the ED, hemoglobin 7.7. Patient was transiently hypotensive in the ED, improved with IV fluid bolus. Hospitalist service requested to admit for further evaluation and management.  Assessment/Plan:  Melena - Upper GI Bleeding - recurrent/chronic -GI following - EGD 3/26 revealed ACTIVELY BLEEDING jejunal ulcer (wife relieved source ID'd) -Liquids only- follow Hgb to determine if bleeding stopped post EGD  Acute on chronic anemia - blood loss in setting of anemia of chronic renal disease -appreciate heme assistance-rec hold anticoagulation for now,add'l PRBC's if can XM, and long-term growth factors -Baseline Hgb ~9 - unable to transfuse earlier due to difficulty with cross-reactivity - continue to follow clinically - hgb nadir 5.4 and given his underlying CAD was cautiously transfused one unit of PRBC's 3/24 -Transfuse one more unit today - pending Hgb am may give add'l blood on 3/27 w/ HD  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 [name] in OP setting noting found with actively bleeding jejunal ulcer 3/26 -Most recent procedure 10/01/2011 DES to mid LAD and PTCA of Diag 2 - asymptomatic at present  HTN  Not an active problem at the present time  HLD   GERD Well-controlled  PAF Currently in normal sinus rhythm - holding anticoagulation in setting of bleeding  End stage renal disease on dialysis; HD T, Th Sat Nephrology is following  Aortic stenosis, moderate, December 2013  Ischemic cardiomyopathy, EF 45-50% echo 12/13  +MRSA screen Usual protocol  Code Status: FULL Family Communication: Spoke with wife at bedside Disposition Plan:  Step down unit  Consultants: Gastroenterology Nephrology  Procedures: None  Antibiotics: None  DVT prophylaxis: SCDs  HPI/Subjective: Sleeping post EGD. MD eval pt in endo. Wife at bedside  Objective: Blood pressure 96/42, pulse 63, temperature 98 F (36.7 C), temperature source Oral, resp. rate 19, height 5\' 10"  (1.778 m), weight 81.5 kg (179 lb 10.8 oz), SpO2 95.00%.  Intake/Output Summary (Last 24 hours) at 07/06/12 1456 Last data filed at 07/06/12 0746  Gross per 24 hour  Intake    360 ml  Output   2111 ml  Net  -1751 ml   Exam: General: No acute respiratory distress Lungs: Clear to auscultation bilaterally without wheezes or crackles Cardiovascular: Regular rate and rhythm without murmur gallop or rub, grade 2-3/6 systolic murmur, no JVD or edema Abdomen: Nontender,  nondistended, soft, bowel sounds positive, no rebound, no ascites, no appreciable mass Extremities: No significant cyanosis, clubbing, or edema bilateral lower extremities  Data Reviewed: Basic Metabolic  Panel:  Recent Labs Lab 07/02/12 1254 07/03/12 0303 07/05/12 1749  NA 140 139 138  K 3.6 4.2 3.9  CL 95* 96 97  CO2  --  31 30  GLUCOSE 123* 102* 138*  BUN 11 20 18   CREATININE 3.10* 5.01* 4.02*  CALCIUM  --  9.5 9.8  PHOS  --   --  2.7   CBC:  Recent Labs Lab 07/02/12 1240  07/03/12 0528 07/03/12 1520 07/04/12 0550 07/05/12 0438 07/06/12 0455  WBC 4.8  --   --  4.4 3.9* 4.5 3.8*  NEUTROABS 2.5  --   --   --   --   --   --   HGB 7.7*  < > 6.3* 6.1* 5.4* 7.1* 7.0*  HCT 23.4*  < > 19.1* 18.4* 16.0* 20.8* 20.6*  MCV 113.6*  --   --  109.5* 111.9* 105.1* 107.3*  PLT 218  --   --  191 156 171 199  < > = values in this interval not displayed.  Recent Results (from the past 240 hour(s))  MRSA PCR SCREENING     Status: Abnormal   Collection Time    07/02/12  3:16 PM      Result Value Range Status   MRSA by PCR POSITIVE (*) NEGATIVE Final   Comment:            The GeneXpert MRSA Assay (FDA     approved for NASAL specimens     only), is one component of a     comprehensive MRSA colonization     surveillance program. It is not     intended to diagnose MRSA     infection nor to guide or     monitor treatment for     MRSA infections.     RESULT CALLED TO, READ BACK BY AND VERIFIED WITH:     Liam Graham RN AT 1610 07/02/12 BY Miquel Dunn     Studies:  Recent x-ray studies have been reviewed in detail by the Attending Physician  Scheduled Meds:  Scheduled Meds: . acetaminophen  650 mg Oral Once  . allopurinol  50 mg Oral BID  . amiodarone  200 mg Oral QHS  . atorvastatin  20 mg Oral q1800  . Chlorhexidine Gluconate Cloth  6 each Topical Q0600  . darbepoetin (ARANESP) injection - DIALYSIS  200 mcg Intravenous Once  . [START ON 07/14/2012] darbepoetin (ARANESP) injection - DIALYSIS  200 mcg Intravenous Q Thu-HD  . diphenhydrAMINE  25 mg Intravenous Once  . [START ON 07/07/2012] ferric gluconate (FERRLECIT/NULECIT) IV  125 mg Intravenous Q T,Th,Sa-HD  . isosorbide  mononitrate  30 mg Oral QHS  . metoprolol tartrate  25 mg Oral BID  . multivitamin  1 tablet Oral BH-q7a  . mupirocin ointment  1 application Nasal BID  . pantoprazole  40 mg Oral BID  . PARoxetine  20 mg Oral Daily  . sevelamer carbonate  2,400 mg Oral TID WC  . sodium chloride  3 mL Intravenous Q12H   Continuous Infusions:   Time spent on care of this patient:   Algernon Huxley  [facility] Office  [phone] Pager - Text Page per Loretha Stapler as per below:  On-Call/Text Page:      [email or web address]      password [email or web address]  If 7PM-7AM, please contact  night-coverage [email or web address] Password [email or web address] 07/06/2012, 2:56 PM   LOS: 4 days   I have personally examined this patient and reviewed the entire database. I have reviewed the above note, made any necessary editorial changes, and agree with its content.  Lonia Blood, MD [facility]

## 2012-07-06 NOTE — Interval H&P Note (Signed)
History and Physical Interval Note:  07/06/2012 11:34 AM  Roger [name]  has presented today for surgery, with the diagnosis of anemia, gi bleed  The various methods of treatment have been discussed with the patient and family. After consideration of risks, benefits and other options for treatment, the patient has consented to  Procedure(s): ENTEROSCOPY (N/A) as a surgical intervention .  The patient's history has been reviewed, patient examined, no change in status, stable for surgery.  I have reviewed the patient's chart and labs.  Questions were answered to the patient's satisfaction.     [name]

## 2012-07-07 ENCOUNTER — Encounter (HOSPITAL_COMMUNITY): Payer: Self-pay | Admitting: Internal Medicine

## 2012-07-07 DIAGNOSIS — D62 Acute posthemorrhagic anemia: Secondary | ICD-10-CM

## 2012-07-07 DIAGNOSIS — K5520 Angiodysplasia of colon without hemorrhage: Secondary | ICD-10-CM

## 2012-07-07 DIAGNOSIS — K922 Gastrointestinal hemorrhage, unspecified: Secondary | ICD-10-CM

## 2012-07-07 LAB — RENAL FUNCTION PANEL
Albumin: 2.9 g/dL — ABNORMAL LOW (ref 3.5–5.2)
CO2: 27 mEq/L (ref 19–32)
Chloride: 97 mEq/L (ref 96–112)
Creatinine, Ser: 7.74 mg/dL — ABNORMAL HIGH (ref 0.50–1.35)
GFR calc Af Amer: 7 mL/min — ABNORMAL LOW (ref >90)
GFR calc non Af Amer: 6 mL/min — ABNORMAL LOW (ref >90)
Sodium: 136 mEq/L (ref 135–145)

## 2012-07-07 LAB — CBC
MCV: 100.9 fL — ABNORMAL HIGH (ref 78.0–100.0)
Platelets: 208 10*3/uL (ref 150–400)
RBC: 2.3 MIL/uL — ABNORMAL LOW (ref 4.22–5.81)
RDW: 22 % — ABNORMAL HIGH (ref 11.5–15.5)
WBC: 3.4 10*3/uL — ABNORMAL LOW (ref 4.0–10.5)

## 2012-07-07 LAB — PREPARE RBC (CROSSMATCH)

## 2012-07-07 MED ORDER — DARBEPOETIN ALFA-POLYSORBATE 200 MCG/0.4ML IJ SOLN
INTRAMUSCULAR | Status: AC
  Administered 2012-07-07: 09:00:00
  Filled 2012-07-07: qty 0.4

## 2012-07-07 MED ORDER — ACETAMINOPHEN 325 MG PO TABS
650.0000 mg | ORAL_TABLET | Freq: Once | ORAL | Status: AC
  Administered 2012-07-07: 650 mg via ORAL
  Filled 2012-07-07: qty 2

## 2012-07-07 MED ORDER — DIPHENHYDRAMINE HCL 50 MG/ML IJ SOLN
50.0000 mg | Freq: Once | INTRAMUSCULAR | Status: AC
  Administered 2012-07-07: 50 mg via INTRAVENOUS
  Filled 2012-07-07: qty 1

## 2012-07-07 NOTE — Progress Notes (Signed)
     [facility] Daily Rounding Note 07/07/2012, 12:14 PM  SUBJECTIVE:       Stool this AM still black.  Feels well. Had dialysis earlier today, they gave parenteral iron infusion during session.  [place], still on [place] diet.   OBJECTIVE:         Vital signs in last 24 hours:    Temp:  [97.8 F (36.6 C)-99.1 F (37.3 C)] 98 F (36.7 C) (03/27 1037) Pulse Rate:  [38-87] 77 (03/27 1037) Resp:  [9-29] 15 (03/27 1037) BP: (95-162)/(40-84) 116/56 mmHg (03/27 1037) SpO2:  [86 %-97 %] 97 % (03/27 1037) Weight:  [76.7 kg (169 lb 1.5 oz)-79.8 kg (175 lb 14.8 oz)] 76.7 kg (169 lb 1.5 oz) (03/27 1008) Last BM Date: 07/05/12 General: pleasant, looks less pale   Heart: RRR with syst murmer Chest: clear.  No cough or dyspnea Abdomen: soft, NT, ND.  Active BS  Extremities: no CCE Neuro/Psych:  Pleasant, alert, in good spirits   Intake/Output from previous day: 03/26 0701 - 03/27 0700 In: 912.5 [P.O.:600; Blood:312.5] Out: 0   Intake/Output this shift: Total I/O In: 120 [P.O.:120] Out: 1613 [Other:1613]  Lab Results:  Recent Labs  07/05/12 0438 07/06/12 0455 07/07/12 0445  WBC 4.5 3.8* 3.4*  HGB 7.1* 7.0* 8.0*  HCT 20.8* 20.6* 23.2*  PLT 171 199 208   BMET  Recent Labs  07/05/12 1749 07/07/12 0445  NA 138 136  K 3.9 5.7*  CL 97 97  CO2 30 27  GLUCOSE 138* 116*  BUN 18 39*  CREATININE 4.02* 7.74*  CALCIUM 9.8 9.7   LFT  Recent Labs  07/05/12 1749 07/07/12 0445  ALBUMIN 3.3* 2.9*    ASSESMENT: * Recurrent acute blood loss on chronic multifactorial anemia in pt on Effient.  EGD and colonoscopy 12/20013 unrevealing (also in 04/2009) . 04/2010 capsule endo with SB erosions vs AVMs.  EGD 3/26: endo clip and epi injection of actively bleeding jejunal AVM.  *  Acute on chronic blood loss anemia. Transfusion with blood problematic due to pt having multiple autoantibodies and hx transfusion reactions. Was able to get one Unit of blood on 3/24 with appropriate jump in Hgb.  Another unit of blood over night with appropriate bump of HGB and one transfusing at present.   Hgb up one more gram since yesterday.    * S/p multiple cardiac stents and angioplasties. Latest in 08/2011. Effient on hold for 1 to 2 weeks with cardiology following.  * ESRD  * Atrial fibrillation.    PLAN: *  Restart renal diet. *  Follow up hgb in AM.    LOS: 5 days   Jennye Moccasin  07/07/2012, 12:14 PM Pager: [phone]  GI ATTENDING  Interval history and laboratories reviewed. Patient seen and examined. Wife, daughter in room. Endoscopy results again reviewed. Patient is doing well. Minimal dark stools. Receiving additional blood transfusions. Exam is benign. Agree with advancing diet as tolerated. Continue to monitor blood counts. Patient's cardiologist to decide on blood thinners. We are available as needed. Will sign off. Thank you  Wilhemina Bonito. Eda Keys., M.D. [facility] [place] Division of Gastroenterology

## 2012-07-07 NOTE — Progress Notes (Signed)
Utilization review completed.  

## 2012-07-07 NOTE — Progress Notes (Signed)
[facility] Progress Note [place] TEAM 1 - Stepdown/ICU TEAM   Roger [name] AOZ:[id number] DOB: [birth year] DOA: 07/02/2012 PCP: [name], MD  Brief narrative: 72 y.o. male with extensive past medical history - HTN, paroxysmal A fib, NSTEMI / CAD status post multiple stents (last in June 2013), ESRD on HD TTS, chronic systolic CHF, ischemic cardiomyopathy, prior GI bleeds which have been evaluated by multiple EGDs and colonoscopies. 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 He was transferred from dialysis unit secondary to generalized weakness, hemoglobin 7.2 g/dL and black stools. Patient gave a 2 week history of gradual onset and slowly worsening generalized weakness but he felt that his legs were going to buckle underneath him. He denied history of falls or syncope. No chest pain or dyspnea. No dizziness or lightheadedness. He gave history of one to 2 black color stools daily - no bright red blood in stools. Denied abdominal pain, nausea or vomiting. In the ED, hemoglobin 7.7. Patient was transiently hypotensive in the ED, improved with IV fluid bolus. Hospitalist service requested to admit for further evaluation and management.  Assessment/Plan:  Melena - Upper GI Bleeding - recurrent/chronic -GI following - EGD 3/26 revealed ACTIVELY BLEEDING jejunal ulcer (wife relieved source ID'd) -GI advancing diet to solid renal  Acute on chronic anemia - blood loss in setting of anemia of chronic renal disease -appreciate heme assistance-rec hold anticoagulation for now,add'l PRBC's if can XM, and long-term growth factors -Baseline Hgb ~9 - unable to transfuse earlier due to difficulty with cross-reactivity - continue to follow clinically - hgb nadir 5.4 and given his underlying CAD was cautiously transfused one unit of PRBC's 3/24 -Transfused add'l unit 3/27 then another unit overnight pre HD - Will give a third unit today (3/27) post HD -daily CBC to minimize iatrogenic  blood loss  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 [name] in OP setting noting found with actively bleeding jejunal ulcer 3/26 -Most recent procedure 10/01/2011 DES to mid LAD and PTCA of Diag 2 - asymptomatic at present  HTN  Not an active problem at the present time  HLD   GERD Well-controlled  PAF Currently in normal sinus rhythm - holding anticoagulation in setting of bleeding  End stage renal disease on dialysis; HD T, Th Sat Nephrology is following  Aortic stenosis, moderate, December 2013  Ischemic cardiomyopathy, EF 45-50% echo 12/13  +MRSA screen Usual protocol  Code Status: FULL Family Communication: Spoke with wife at bedside Disposition Plan:  Step down unit-if Hgb stable next 48 hrs consider transfer vs dc  Consultants: Gastroenterology Nephrology  Procedures: None  Antibiotics: None  DVT prophylaxis: SCDs  HPI/Subjective: Sleeping post EGD. MD eval pt in endo. Wife at bedside  Objective: Blood pressure 97/48, pulse 73, temperature 98.9 F (37.2 C), temperature source Oral, resp. rate 22, height 5\' 10"  (1.778 m), weight 76.7 kg (169 lb 1.5 oz), SpO2 93.00%.  Intake/Output Summary (Last 24 hours) at 07/07/12 1349 Last data filed at 07/07/12 1347  Gross per 24 hour  Intake 1757.5 ml  Output   1613 ml  Net  144.5 ml   Exam: General: No acute respiratory distress Lungs: Clear to auscultation bilaterally without wheezes or crackles Cardiovascular: Regular rate and rhythm without murmur gallop or  rub, grade 2-3/6 systolic murmur, no JVD or edema Abdomen: Nontender, nondistended, soft, bowel sounds positive, no rebound, no ascites, no appreciable mass Extremities: No significant cyanosis, clubbing, or  edema bilateral lower extremities  Data Reviewed: Basic Metabolic Panel:  Recent Labs Lab 07/02/12 1254 07/03/12 0303 07/05/12 1749 07/07/12 0445  NA 140 139 138 136  K 3.6 4.2 3.9 5.7*  CL 95* 96 97 97  CO2  --  31 30 27   GLUCOSE 123* 102* 138* 116*  BUN 11 20 18  39*  CREATININE 3.10* 5.01* 4.02* 7.74*  CALCIUM  --  9.5 9.8 9.7  PHOS  --   --  2.7 4.3   CBC:  Recent Labs Lab 07/02/12 1240  07/03/12 1520 07/04/12 0550 07/05/12 0438 07/06/12 0455 07/07/12 0445  WBC 4.8  --  4.4 3.9* 4.5 3.8* 3.4*  NEUTROABS 2.5  --   --   --   --   --   --   HGB 7.7*  < > 6.1* 5.4* 7.1* 7.0* 8.0*  HCT 23.4*  < > 18.4* 16.0* 20.8* 20.6* 23.2*  MCV 113.6*  --  109.5* 111.9* 105.1* 107.3* 100.9*  PLT 218  --  191 156 171 199 208  < > = values in this interval not displayed.  Recent Results (from the past 240 hour(s))  MRSA PCR SCREENING     Status: Abnormal   Collection Time    07/02/12  3:16 PM      Result Value Range Status   MRSA by PCR POSITIVE (*) NEGATIVE Final   Comment:            The GeneXpert MRSA Assay (FDA     approved for NASAL specimens     only), is one component of a     comprehensive MRSA colonization     surveillance program. It is not     intended to diagnose MRSA     infection nor to guide or     monitor treatment for     MRSA infections.     RESULT CALLED TO, READ BACK BY AND VERIFIED WITH:     Liam Graham RN AT 4540 07/02/12 BY Miquel Dunn     Studies:  Recent x-ray studies have been reviewed in detail by the Attending Physician  Scheduled Meds:  Scheduled Meds: . allopurinol  50 mg Oral BID  . amiodarone  200 mg Oral QHS  . atorvastatin  20 mg Oral q1800  . darbepoetin (ARANESP) injection - DIALYSIS  200 mcg Intravenous Once  . [START ON 07/14/2012] darbepoetin (ARANESP) injection - DIALYSIS  200 mcg Intravenous Q Thu-HD  . diphenhydrAMINE  25 mg Intravenous Once  . ferric gluconate (FERRLECIT/NULECIT) IV  125 mg Intravenous Q T,Th,Sa-HD  .  isosorbide mononitrate  30 mg Oral QHS  . metoprolol tartrate  25 mg Oral BID  . multivitamin  1 tablet Oral BH-q7a  . pantoprazole  40 mg Oral BID  . PARoxetine  20 mg Oral Daily  . sevelamer carbonate  2,400 mg Oral TID WC  . sodium chloride  3 mL Intravenous Q12H   Continuous Infusions:   Time spent on care of this patient:   Algernon Huxley  [facility] Office  [phone] Pager - Text Page per Loretha Stapler as per below:  On-Call/Text Page:      [email or web address]      password [email or web address]  If 7PM-7AM, please contact night-coverage [email or web address] Password [email or web address] 07/07/2012, 1:49 PM   LOS: 5 days  I have examined the patient, reviewed the chart and modified the above note which I agree with.   Nyelle Wolfson,MD [phone] 07/07/2012, 7:03 PM

## 2012-07-07 NOTE — Progress Notes (Signed)
Currently holding prasugrel in the setting of GI bleeding. No evidence for active angina. Call us if needed, will sign-off for now.  Chrystie Nose, MD, [facility] Attending Cardiologist [facility]

## 2012-07-07 NOTE — Progress Notes (Signed)
Subjective: Feels a lot better  Objective Vital signs in last 24 hours: Filed Vitals:   07/07/12 0930 07/07/12 1000 07/07/12 1008 07/07/12 1037  BP: 131/68 129/69 123/65 116/56  Pulse: 71 38 69 77  Temp:   98.2 F (36.8 C) 98 F (36.7 C)  TempSrc:   Oral Oral  Resp: 18 18 18 15   Height:      Weight:   76.7 kg (169 lb 1.5 oz)   SpO2:   96% 97%   Weight change: 0 kg (0 lb)  Intake/Output Summary (Last 24 hours) at 07/07/12 1144 Last data filed at 07/07/12 1055  Gross per 24 hour  Intake 1032.5 ml  Output   1613 ml  Net -580.5 ml   Labs: Basic Metabolic Panel:  Recent Labs Lab 07/02/12 1254 07/03/12 0303 07/05/12 1749 07/07/12 0445  NA 140 139 138 136  K 3.6 4.2 3.9 5.7*  CL 95* 96 97 97  CO2  --  31 30 27   GLUCOSE 123* 102* 138* 116*  BUN 11 20 18  39*  CREATININE 3.10* 5.01* 4.02* 7.74*  CALCIUM  --  9.5 9.8 9.7  PHOS  --   --  2.7 4.3   Liver Function Tests:  Recent Labs Lab 07/05/12 1749 07/07/12 0445  ALBUMIN 3.3* 2.9*   No results found for this basename: LIPASE, AMYLASE,  in the last 168 hours No results found for this basename: AMMONIA,  in the last 168 hours CBC:  Recent Labs Lab 07/02/12 1240  07/04/12 0550 07/05/12 0438 07/06/12 0455 07/07/12 0445  WBC 4.8  < > 3.9* 4.5 3.8* 3.4*  NEUTROABS 2.5  --   --   --   --   --   HGB 7.7*  < > 5.4* 7.1* 7.0* 8.0*  HCT 23.4*  < > 16.0* 20.8* 20.6* 23.2*  MCV 113.6*  < > 111.9* 105.1* 107.3* 100.9*  PLT 218  < > 156 171 199 208  < > = values in this interval not displayed. PT/INR: @LABRCNTIP (inr:5)   Scheduled Meds ) . allopurinol  50 mg Oral BID  . amiodarone  200 mg Oral QHS  . atorvastatin  20 mg Oral q1800  . darbepoetin (ARANESP) injection - DIALYSIS  200 mcg Intravenous Once  . [START ON 07/14/2012] darbepoetin (ARANESP) injection - DIALYSIS  200 mcg Intravenous Q Thu-HD  . diphenhydrAMINE  25 mg Intravenous Once  . ferric gluconate (FERRLECIT/NULECIT) IV  125 mg Intravenous Q  T,Th,Sa-HD  . isosorbide mononitrate  30 mg Oral QHS  . metoprolol tartrate  25 mg Oral BID  . multivitamin  1 tablet Oral BH-q7a  . pantoprazole  40 mg Oral BID  . PARoxetine  20 mg Oral Daily  . sevelamer carbonate  2,400 mg Oral TID WC  . sodium chloride  3 mL Intravenous Q12H    Physical Exam:  Blood pressure 116/56, pulse 77, temperature 98 F (36.7 C), temperature source Oral, resp. rate 15, height 5\' 10"  (1.778 m), weight 76.7 kg (169 lb 1.5 oz), SpO2 97.00%.  Gen: alert, no distress Neck: no JVD  Resp: clear bilat  Cardio: RRR with Gr II/VI systolic murmur, no rub  GI: soft, non-tender; no ascites or HSM Ext: no edema bilaterally  Neuro: Grossly normal  Dialysis Access: AVF @ LUA with + bruit   Dialysis Orders: Center: [facility] on TTS.  EDW 78 kg HD Bath 2K/2.25Ca Time 3hrs 15 mins Heparin 0. Access AVF @ LUA BFR 450 DFR A2.0 Hectorol 1  mcg IV/HD Epogen 3800 Units IV/HD Venofer 0.   Assessment/Plan:  1. 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 Also SB AVM's in 2011. Rec'd one unit prbc 3/24, second unit on 3/26. Hb up 8.0 today, getting 3rd unit today. Small black stool today 2. ESRD, cont TTS HD. HD today 3. Hypertension/volume - on Metoprolol 25 mg bid; is right at dry weight, BP normal 4. Anemia / CKD- +Ab's, transfuse prn. Max darbe 200/wk, give 500mg  IV Fe over HDx4 (tsat 21%) 5. Metabolic bone disease - Ca 9's, phos 4.3,  iPTH 183 on 2/14; Hectorol 1 mcg, Renvela 3 with meals. 6. Nutrition - Last Alb 4, renal vitamin. 7. Paroxysmal atrial fib - HR controlled on Metoprolol, Coumadin on hold since 03/2012. 8. CAD - s/p coronary angioplasty with stent placement 2013, followed by Dr. [name]. 9. Aortic stenosis - moderate per echo 03/15/12. 10. Schatzki's ring - disrupted per EGD 03/2012.    Vinson Moselle  MD [phone] pgr    [phone] cell 07/07/2012, 11:44 AM

## 2012-07-08 LAB — CBC
Hemoglobin: 9.2 g/dL — ABNORMAL LOW (ref 13.0–17.0)
MCH: 34.6 pg — ABNORMAL HIGH (ref 26.0–34.0)
MCHC: 33.8 g/dL (ref 30.0–36.0)
Platelets: 221 10*3/uL (ref 150–400)

## 2012-07-08 MED ORDER — DARBEPOETIN ALFA-POLYSORBATE 200 MCG/0.4ML IJ SOLN: 200.0000 ug | INTRAMUSCULAR | Status: DC

## 2012-07-08 MED ORDER — SODIUM CHLORIDE 0.9 % IV SOLN: 125.0000 mg | INTRAVENOUS | Status: DC

## 2012-07-08 NOTE — Progress Notes (Signed)
Subjective: Feels a lot better  Objective Vital signs in last 24 hours: Filed Vitals:   07/08/12 0500 07/08/12 0800 07/08/12 0829 07/08/12 1038  BP:  121/55  114/62  Pulse:  62  63  Temp:   99.2 F (37.3 C)   TempSrc:   Oral   Resp:  20    Height:      Weight: 81.6 kg (179 lb 14.3 oz)     SpO2:  94%     Weight change: -3.1 kg (-6 lb 13.3 oz)  Intake/Output Summary (Last 24 hours) at 07/08/12 1351 Last data filed at 07/08/12 1043  Gross per 24 hour  Intake    853 ml  Output      0 ml  Net    853 ml   Labs: Basic Metabolic Panel:  Recent Labs Lab 07/02/12 1254 07/03/12 0303 07/05/12 1749 07/07/12 0445  NA 140 139 138 136  K 3.6 4.2 3.9 5.7*  CL 95* 96 97 97  CO2  --  31 30 27   GLUCOSE 123* 102* 138* 116*  BUN 11 20 18  39*  CREATININE 3.10* 5.01* 4.02* 7.74*  CALCIUM  --  9.5 9.8 9.7  PHOS  --   --  2.7 4.3   Liver Function Tests:  Recent Labs Lab 07/05/12 1749 07/07/12 0445  ALBUMIN 3.3* 2.9*   No results found for this basename: LIPASE, AMYLASE,  in the last 168 hours No results found for this basename: AMMONIA,  in the last 168 hours CBC:  Recent Labs Lab 07/02/12 1240  07/05/12 0438 07/06/12 0455 07/07/12 0445 07/08/12 0330  WBC 4.8  < > 4.5 3.8* 3.4* 4.2  NEUTROABS 2.5  --   --   --   --   --   HGB 7.7*  < > 7.1* 7.0* 8.0* 9.2*  HCT 23.4*  < > 20.8* 20.6* 23.2* 27.2*  MCV 113.6*  < > 105.1* 107.3* 100.9* 102.3*  PLT 218  < > 171 199 208 221  < > = values in this interval not displayed. PT/INR: @LABRCNTIP (inr:5)   Scheduled Meds ) . allopurinol  50 mg Oral BID  . amiodarone  200 mg Oral QHS  . atorvastatin  20 mg Oral q1800  . [START ON 07/14/2012] darbepoetin (ARANESP) injection - DIALYSIS  200 mcg Intravenous Q Thu-HD  . diphenhydrAMINE  25 mg Intravenous Once  . ferric gluconate (FERRLECIT/NULECIT) IV  125 mg Intravenous Q T,Th,Sa-HD  . isosorbide mononitrate  30 mg Oral QHS  . metoprolol tartrate  25 mg Oral BID  . multivitamin  1  tablet Oral BH-q7a  . pantoprazole  40 mg Oral BID  . PARoxetine  20 mg Oral Daily  . sevelamer carbonate  2,400 mg Oral TID WC  . sodium chloride  3 mL Intravenous Q12H    Physical Exam:  Blood pressure 114/62, pulse 63, temperature 99.2 F (37.3 C), temperature source Oral, resp. rate 20, height 5\' 10"  (1.778 m), weight 81.6 kg (179 lb 14.3 oz), SpO2 94.00%.  Gen: alert, no distress Neck: no JVD  Resp: clear bilat  Cardio: RRR with Gr II/VI systolic murmur, no rub  GI: soft, non-tender; no ascites or HSM Ext: no edema bilaterally  Neuro: Grossly normal  Dialysis Access: AVF @ LUA with + bruit   Dialysis Orders: Center: [facility] on TTS.  EDW 78 kg HD Bath 2K/2.25Ca Time 3hrs 15 mins Heparin 0. Access AVF @ LUA BFR 450 DFR A2.0 Hectorol 1 mcg IV/HD Epogen 3800  Units IV/HD Venofer 0.   Assessment/Plan:  1. 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 S/P 3 u prbc's.  For prob d/c today 2. ESRD, cont TTS HD 3. Hypertension/volume - on Metoprolol 25 mg bid; is right at dry weight, BP normal 4. Anemia / CKD- +Ab's, transfuse prn. Max darbe 200/wk, give 500mg  IV Fe over HDx4 (tsat 21%) 5. Metabolic bone disease - Ca 9's, phos 4.3,  iPTH 183 on 2/14; Hectorol 1 mcg, Renvela 3 with meals. 6. Nutrition - Last Alb 4, renal vitamin. 7. Paroxysmal atrial fib - HR controlled on Metoprolol, Coumadin on hold since 03/2012. 8. CAD - s/p coronary angioplasty with stent placement 2013, followed by Dr. [name]. 9. Aortic stenosis - moderate per echo 03/15/12. 10. Schatzki's ring - disrupted per EGD 03/2012.    Vinson Moselle  MD [phone] pgr    [phone] cell 07/08/2012, 1:51 PM

## 2012-07-08 NOTE — Discharge Summary (Signed)
Physician Discharge Summary  Roger [name]:[id number] DOB: 11-25-1940 DOA: 07/02/2012  PCP: [name], MD  Admit date: 07/02/2012 Discharge date: 07/08/2012  Time spent: 30 minutes  Recommendations for Outpatient Follow-up:  1. Needs to followup with Dr. Berry/cardiology to discuss risks and benefits of resuming Prasurgel. 2. Needs to call gastroenterology/Dr. Leone Payor to schedule outpatient followup. Needs to notify them sooner if bleeding recurs. 3. Continue usual hemodialysis protocol.  Discharge Diagnoses:  Active Problems:   ANEMIA due to acute blood loss-hemoglobin stayed   CAD, MI CFX BMS 12/11 with ISR DES (as well as OM) May 2013   HTN (hypertension)   HLD (hyperlipidemia)   GERD (gastroesophageal reflux disease)   PAF (paroxysmal atrial fibrillation)   End stage renal disease on dialysis; HD T, Th Sat   Hypotension   Aortic stenosis, moderate, December 2013   Ischemic cardiomyopathy, EF 45-50% echo 12/13   H/O diastolic dysfunction   Upper GI bleeding secondary to actively bleeding jejunal ulcer   Known Angiodysplasia of intestine with hemorrhage/    Discharge Condition: stable  Diet recommendation: Renal  Filed Weights   07/07/12 1008 07/08/12 0400 07/08/12 0500  Weight: 76.7 kg (169 lb 1.5 oz) 75.4 kg (166 lb 3.6 oz) 81.6 kg (179 lb 14.3 oz)    History of present illness:  72 y.o. male with extensive past medical history - HTN, paroxysmal A fib, NSTEMI / CAD status post multiple stents (last in June 2013), ESRD on HD TTS, chronic systolic CHF, ischemic cardiomyopathy, prior GI bleeds which have been evaluated by multiple EGDs and colonoscopies. 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 He was transferred from dialysis unit secondary to generalized weakness, hemoglobin 7.2 g/dL and black stools. Patient gave a 2 week history of gradual onset and slowly worsening generalized weakness but he felt that his legs were going to buckle underneath him.  He denied history of falls or syncope. No chest pain or dyspnea. No dizziness or lightheadedness. He gave history of one to 2 black color stools daily - no bright red blood in stools. Denied abdominal pain, nausea or vomiting. In the ED, hemoglobin 7.7. Patient was transiently hypotensive in the ED, improved with IV fluid bolus. Hospitalist service requested to admit for further evaluation and management   Hospital Course:  Melena - Upper GI Bleeding - recurrent/chronic Has had recurrent GI bleeding with presumed AVMs although previous endoscopies could never formally clarify the source. During this admission the patient underwent a small bowel enteroscopy and was finally found to have an actively bleeding jejunal ulcer. This was successfully treated with an Endo Clip and epinephrine injection with hemostasis achieved. Patient will continue proton pump inhibitor indefinitely. His diet was slowly advanced to a solid diet without any recurrence of bleeding and therefore was deemed appropriate for discharge home.  Acute on chronic anemia - blood loss in setting of anemia of chronic renal disease He presented with a hemoglobin of 7.7 with a nadir of 5.4 this admission. As noted his baseline hemoglobin is around 9. Transfusion of blood has been difficult this admission due to extensive antibodies and cross-reactivity. We were able to appropriately crossmatch him during the hospitalization. He has received a total of 3 units of packed red blood cells this admission. Hemoglobin on date of discharge 9.2 with hematocrit of 27.2 and a platelet count of 221,000. Because of his multiple serum antibodies from prior transfusions hematology was consulted and made recommendations including transfusing up to baseline hemoglobin if  possible and continuing long-term growth factors i.e. Aranesp.  CAD w/ hx of MI s/p multiple PTCA + stenting procedures (BMS and DES) Because of recurrent GI bleeding patient's Prasurgel was  held at admission. GI recommended holding this medication acutely but defer to cardiologist risk and benefit of continuing this medication given this patient's history of recurrent GI bleeding, current active GI bleeding and also issues regarding difficulty with cross matching blood for this patient. At this time the cardiologist is holding the anticoagulation for at least one to 2 weeks to allow for hemostasis.  The consulting cardiologist has deferred whether to resume this medication to the patient's primary cardiologist Dr. [name] in the outpatient setting.  It is important to note that this patient underwent a drug-eluting stent placement to the mid LAD as well as a PTCA of the diagonal 2 on 10/01/2011 and has remained asymptomatic from an ischemic/chest pain standpoint this admission.  End stage renal disease on dialysis; HD T, Th Sat Tolerated hemodialysis well this admission. He will continue to receive Aranesp and IV iron with hemodialysis treatments.  PAF Has maintained sinus rhythm this hospitalization. As noted anticoagulation is being held in the setting of active GI bleeding.  Procedures:  Small bowel enteroscopy (07/06/12) ENDOSCOPIC IMPRESSION:  1. Actively bleeding jejunal AVM status post successful Endo  Clip/epinephrine injection  RECOMMENDATIONS:  1. Please try to transfuse this patient an additional 1 or 2 units  of blood, when safe blood available  2. Liquids  3. Monitor H&H as well as stools   Consultations: Gastroenterology  Nephrology Cardiology Pathology  Discharge Exam: Filed Vitals:   07/08/12 0500 07/08/12 0800 07/08/12 0829 07/08/12 1038  BP:  121/55  114/62  Pulse:  62  63  Temp:   99.2 F (37.3 C)   TempSrc:   Oral   Resp:  20    Height:      Weight: 81.6 kg (179 lb 14.3 oz)     SpO2:  94%     Exam:  General: No acute respiratory distress  Lungs: Clear to auscultation bilaterally without wheezes or crackles  Cardiovascular: Regular rate and  rhythm without murmur gallop or rub, grade 2-3/6 systolic murmur, no JVD or edema  Abdomen: Nontender, nondistended, soft, bowel sounds positive, no rebound, no ascites, no appreciable mass  Extremities: No significant cyanosis, clubbing, or edema bilateral lower extremities   Discharge Instructions      Discharge Orders   Future Orders Complete By Expires     Call MD for:  extreme fatigue  As directed     Call MD for:  persistant dizziness or light-headedness  As directed     Call MD for:  As directed     Scheduling Instructions:      Any recurrent upper lower GI bleeding    Diet general  As directed     Comments:      Renal diet    Increase activity slowly  As directed         Medication List    STOP taking these medications       prasugrel 10 MG Tabs  Commonly known as:  EFFIENT      TAKE these medications       allopurinol 100 MG tablet  Commonly known as:  ZYLOPRIM  Take 50 mg by mouth 2 (two) times daily.     amiodarone 200 MG tablet  Commonly known as:  PACERONE  Take 200 mg by mouth at bedtime.  darbepoetin 200 MCG/0.4ML Soln  Commonly known as:  ARANESP  Inject 0.4 mLs (200 mcg total) into the vein every Thursday with hemodialysis.  Start taking on:  07/14/2012     isosorbide mononitrate 30 MG 24 hr tablet  Commonly known as:  IMDUR  Take 30 mg by mouth at bedtime.     metoprolol tartrate 25 MG tablet  Commonly known as:  LOPRESSOR  Take 25 mg by mouth 2 (two) times daily.     multivitamin Tabs tablet  Take 1 tablet by mouth every morning.     nitroGLYCERIN 0.4 MG SL tablet  Commonly known as:  NITROSTAT  Place 1 tablet (0.4 mg total) under the tongue every 5 (five) minutes as needed.     pantoprazole 40 MG tablet  Commonly known as:  PROTONIX  Take 40 mg by mouth 2 (two) times daily.     PARoxetine 20 MG tablet  Commonly known as:  PAXIL  Take 1 tablet (20 mg total) by mouth daily.     rosuvastatin 10 MG tablet  Commonly known as:   CRESTOR  Take 10 mg by mouth at bedtime.     sevelamer carbonate 800 MG tablet  Commonly known as:  RENVELA  Take 2,400 mg by mouth 3 (three) times daily with meals.     sodium chloride 0.9 % SOLN 100 mL with ferric gluconate 12.5 MG/ML SOLN 125 mg  Inject 125 mg into the vein Every Tuesday,Thursday,and Saturday with dialysis.     TAGAMET PO  Take 1 tablet by mouth at bedtime as needed (OTC). For acid reflux.       Follow-up Information   Follow up with [name], MD. Schedule an appointment as soon as possible for a visit in 2 weeks.   Contact information:   [address] [place] [address] [phone]       Follow up with Runell Gess, MD. Schedule an appointment as soon as possible for a visit in 2 weeks.   Contact information:   [address] [place] [address] [phone]       Follow up with Stan Head, MD. Schedule an appointment as soon as possible for a visit in 3 weeks. (Call sooner if bleeding recurs)    Contact information:   [address] [place] [address] [phone]        The results of significant diagnostics from this hospitalization (including imaging, microbiology, ancillary and laboratory) are listed below for reference.    Significant Diagnostic Studies: No results found.  Microbiology: Recent Results (from the past 240 hour(s))  MRSA PCR SCREENING     Status: Abnormal   Collection Time    07/02/12  3:16 PM      Result Value Range Status   MRSA by PCR POSITIVE (*) NEGATIVE Final   Comment:            The GeneXpert MRSA Assay (FDA     approved for NASAL specimens     only), is one component of a     comprehensive MRSA colonization     surveillance program. It is not     intended to diagnose MRSA     infection nor to guide or     monitor treatment for     MRSA infections.     RESULT CALLED TO, READ BACK BY AND VERIFIED WITH:     Liam Graham RN AT 2440 07/02/12 BY WOOLLENK      Labs:  Basic Metabolic Panel:  Recent Labs Lab 07/02/12 1254 07/03/12 0303 07/05/12 1749 07/07/12 0445  NA 140 139 138 136  K 3.6 4.2 3.9 5.7*  CL 95* 96 97 97  CO2  --  31 30 27   GLUCOSE 123* 102* 138* 116*  BUN 11 20 18  39*  CREATININE 3.10* 5.01* 4.02* 7.74*  CALCIUM  --  9.5 9.8 9.7  PHOS  --   --  2.7 4.3   Liver Function Tests:  Recent Labs Lab 07/05/12 1749 07/07/12 0445  ALBUMIN 3.3* 2.9*   No results found for this basename: LIPASE, AMYLASE,  in the last 168 hours No results found for this basename: AMMONIA,  in the last 168 hours CBC:  Recent Labs Lab 07/02/12 1240  07/04/12 0550 07/05/12 0438 07/06/12 0455 07/07/12 0445 07/08/12 0330  WBC 4.8  < > 3.9* 4.5 3.8* 3.4* 4.2  NEUTROABS 2.5  --   --   --   --   --   --   HGB 7.7*  < > 5.4* 7.1* 7.0* 8.0* 9.2*  HCT 23.4*  < > 16.0* 20.8* 20.6* 23.2* 27.2*  MCV 113.6*  < > 111.9* 105.1* 107.3* 100.9* 102.3*  PLT 218  < > 156 171 199 208 221  < > = values in this interval not displayed. Cardiac Enzymes: No results found for this basename: CKTOTAL, CKMB, CKMBINDEX, TROPONINI,  in the last 168 hours BNP: BNP (last 3 results) No results found for this basename: PROBNP,  in the last 8760 hours CBG:  Recent Labs Lab 07/04/12 1219  GLUCAP 92    Signed:  ELLIS,ALLISON L. (ANP)  [facility] 07/08/2012, 11:54 AM   I have examined the patient, reviewed the chart and modified the above note which I agree with.   Vonnetta Akey,MD [phone] 07/08/2012, 2:29 PM

## 2012-07-08 NOTE — Progress Notes (Signed)
Patient discharged home via wheelchair with wife and daughter via private car.  Patient education completed on follow up visits and when to contact MD. Education given to daughter as well. Patient stable at the time of discharge. Vital signs stable, IV discontinued.  Patient and family given time to ask questions and verbalize understanding.

## 2012-07-10 LAB — TYPE AND SCREEN
Antibody Screen: POSITIVE
Unit division: 0
Unit division: 0
Unit division: 0

## 2012-07-12 ENCOUNTER — Other Ambulatory Visit (HOSPITAL_COMMUNITY): Payer: Self-pay | Admitting: Cardiovascular Disease

## 2012-07-12 DIAGNOSIS — R9431 Abnormal electrocardiogram [ECG] [EKG]: Secondary | ICD-10-CM

## 2012-07-12 DIAGNOSIS — R0602 Shortness of breath: Secondary | ICD-10-CM

## 2012-07-14 ENCOUNTER — Telehealth: Payer: Self-pay | Admitting: *Deleted

## 2012-07-14 NOTE — Telephone Encounter (Signed)
I have spoken to patient to advise that Dr Leone Payor would like him to follow up in our office for his recent GI bleeding. Patient has been given an appointment on 07/26/12 @ 2:15 pm with Dr Leone Payor. Patient verbalizes understanding and has been advised to call us back before that date should he begin having recurrent symptoms.   ----- Message -----  From: Iva Boop, MD Sent: 07/13/2012 1:11 PM  To: Patti E [place], CMA Subject:  FW: Inpatient Notes   Hospital patient with recurrent GI bleeding Needs a follow-up visit with me   ----- Message -----  From: Russella Dar, NP  Sent: 07/08/2012 11:57 AM  To: Iva Boop, MD  Subject: Inpatient Notes

## 2012-07-19 ENCOUNTER — Emergency Department (HOSPITAL_COMMUNITY)
Admission: EM | Admit: 2012-07-19 | Discharge: 2012-07-19 | Disposition: A | Discharge status: Home / Self Care | Payer: Medicare Other | Attending: Emergency Medicine | Admitting: Emergency Medicine

## 2012-07-19 ENCOUNTER — Encounter (HOSPITAL_COMMUNITY): Payer: Self-pay | Admitting: *Deleted

## 2012-07-19 DIAGNOSIS — Z992 Dependence on renal dialysis: Secondary | ICD-10-CM | POA: Insufficient documentation

## 2012-07-19 DIAGNOSIS — I4891 Unspecified atrial fibrillation: Secondary | ICD-10-CM | POA: Insufficient documentation

## 2012-07-19 DIAGNOSIS — I252 Old myocardial infarction: Secondary | ICD-10-CM | POA: Insufficient documentation

## 2012-07-19 DIAGNOSIS — Z9889 Other specified postprocedural states: Secondary | ICD-10-CM | POA: Insufficient documentation

## 2012-07-19 DIAGNOSIS — Z8739 Personal history of other diseases of the musculoskeletal system and connective tissue: Secondary | ICD-10-CM | POA: Insufficient documentation

## 2012-07-19 DIAGNOSIS — E7800 Pure hypercholesterolemia, unspecified: Secondary | ICD-10-CM | POA: Insufficient documentation

## 2012-07-19 DIAGNOSIS — K219 Gastro-esophageal reflux disease without esophagitis: Secondary | ICD-10-CM | POA: Insufficient documentation

## 2012-07-19 DIAGNOSIS — R011 Cardiac murmur, unspecified: Secondary | ICD-10-CM | POA: Insufficient documentation

## 2012-07-19 DIAGNOSIS — Z8679 Personal history of other diseases of the circulatory system: Secondary | ICD-10-CM | POA: Insufficient documentation

## 2012-07-19 DIAGNOSIS — Z79899 Other long term (current) drug therapy: Secondary | ICD-10-CM | POA: Insufficient documentation

## 2012-07-19 DIAGNOSIS — N186 End stage renal disease: Secondary | ICD-10-CM | POA: Insufficient documentation

## 2012-07-19 DIAGNOSIS — D649 Anemia, unspecified: Secondary | ICD-10-CM | POA: Insufficient documentation

## 2012-07-19 DIAGNOSIS — Z87891 Personal history of nicotine dependence: Secondary | ICD-10-CM | POA: Insufficient documentation

## 2012-07-19 DIAGNOSIS — I120 Hypertensive chronic kidney disease with stage 5 chronic kidney disease or end stage renal disease: Secondary | ICD-10-CM | POA: Insufficient documentation

## 2012-07-19 DIAGNOSIS — M109 Gout, unspecified: Secondary | ICD-10-CM | POA: Insufficient documentation

## 2012-07-19 DIAGNOSIS — Z8719 Personal history of other diseases of the digestive system: Secondary | ICD-10-CM | POA: Insufficient documentation

## 2012-07-19 DIAGNOSIS — Z872 Personal history of diseases of the skin and subcutaneous tissue: Secondary | ICD-10-CM | POA: Insufficient documentation

## 2012-07-19 DIAGNOSIS — Z9861 Coronary angioplasty status: Secondary | ICD-10-CM | POA: Insufficient documentation

## 2012-07-19 HISTORY — DX: Reserved for concepts with insufficient information to code with codable children: IMO0002

## 2012-07-19 LAB — APTT: aPTT: 37 seconds (ref 24–37)

## 2012-07-19 LAB — BASIC METABOLIC PANEL
CO2: 34 mEq/L — ABNORMAL HIGH (ref 19–32)
Chloride: 97 mEq/L (ref 96–112)
GFR calc Af Amer: 14 mL/min — ABNORMAL LOW (ref >90)
Potassium: 3.6 mEq/L (ref 3.5–5.1)
Sodium: 144 mEq/L (ref 135–145)

## 2012-07-19 LAB — CBC WITH DIFFERENTIAL/PLATELET
Basophils Absolute: 0 10*3/uL (ref 0.0–0.1)
Eosinophils Relative: 6 % — ABNORMAL HIGH (ref 0–5)
Lymphocytes Relative: 23 % (ref 12–46)
Lymphs Abs: 1 10*3/uL (ref 0.7–4.0)
MCV: 106.9 fL — ABNORMAL HIGH (ref 78.0–100.0)
Monocytes Relative: 16 % — ABNORMAL HIGH (ref 3–12)
Neutrophils Relative %: 55 % (ref 43–77)
Platelets: 215 10*3/uL (ref 150–400)
RBC: 2.48 MIL/uL — ABNORMAL LOW (ref 4.22–5.81)
RDW: 22.1 % — ABNORMAL HIGH (ref 11.5–15.5)
WBC: 4.5 10*3/uL (ref 4.0–10.5)

## 2012-07-19 LAB — PROTIME-INR
INR: 1.17 (ref 0.00–1.49)
Prothrombin Time: 14.7 seconds (ref 11.6–15.2)

## 2012-07-19 LAB — TYPE AND SCREEN
ABO/RH(D): O POS
Antibody Screen: POSITIVE

## 2012-07-19 NOTE — ED Provider Notes (Signed)
History     CSN: [id number]  Arrival date & time 07/19/12  1142   First MD Initiated Contact with Patient 07/19/12 1217      Chief Complaint  Patient presents with  . Anemia    hgb 7.9 per nephrologist    (Consider location/radiation/quality/duration/timing/severity/associated sxs/prior treatment) Patient is a 72 y.o. male presenting with anemia.  Anemia   Pt with history of ESRD on HD TThSa also has recurrent GI bleeding from presumed small bowel AVM. Recently admitted for GI bleed and anemia, complicated by several autoantibodies and difficulty finding compatible blood. He was at his regular dialysis today and found to have Hgb 7.9. Advised to come to the ED. States stools have continued to be dark at times, feels generally weak, but no CP, SOB. No vomiting or abdominal pain.   Past Medical History  Diagnosis Date  . HTN (hypertension)   . Arthritis   . Hypercholesteremia   . MI (myocardial infarction) 03/16/2011    inferolateral ST segment elevation MI  . GERD (gastroesophageal reflux disease)   . Atrial fibrillation     Paroxysmal - 2 episodes related to ischemic NSTEMI events  5 & 09/2011  . Gout   . S/P coronary artery stent placement, 10/01/11 with DES to mid LAD and  PTCA of Diag 2 10/01/2009    Inferior STEMI 2011 - BMS to LCx; NSTEMI 5/13 with ISR/thrombosis of Cx BMS -->multiple DES to  LCx ; staged PCI of LAD /Balloon PTCA of major Diag 09/2011 for Sx (NSTEMI)   . Anticoagulated on warfarin 10/06/2011  . Heart murmur   . Anemia   . Schatzki's ring   . Angiodysplasia of intestine   . Aortic stenosis, moderate     2D Echocardiogram 12/13 and 08/22/11 showed moderate aortic stenosis with valve area of 1.28 sq cm. EF of 45 to 50% with severe hypokinesia of the entire inferolateral myocardium  . ESRD on hemodialysis     [place] HD, TTS, esrd due to HTN  . Right renal artery stenosis 1998  . Ulcer     Past Surgical History  Procedure Laterality Date  . Ureteral  stent placement    . Colonoscopy  04/2009    with EGD by Dr.Buccini moderately large hiatal hernia could contribute to his anemia,Schatzki ring which might accout for his intermittent dysphagia/+internal hemorrhoids ;family hx of colon ca  . Esophagogastroduodenoscopy  03/2010    by Dr.Magod/small hiatal hernia with widley patent fibrous ring,minimal bulbitis otherwise nl  . Dialysis fistula creation    . Coronary angioplasty with stent placement  08/2011 & 09/2011    Integrity Resolute DES Stents  Stent #1:  2.42mm x 14 mm (extending to just distal to the bifurcation to the mid OM, Stent #2:  2.8mm x 18mm  Stent #3:  3.68mm x 18mm; covering proximal portion of the old stent into the proximal circumflex and overlapping stent #2 distally, Stent #4: 2.89mm x 8mm (4 overlapping in LCx- OM1; 1 in mid LAD; PTCA only of Major Diag (small caliber)  . Coronary angioplasty with stent placement  2011, 2012    Dr [name]  03/16/11  re-cannulized totally occluded circumflex, stented with Integrity bare metal stent focal 90% stenosis in LAD and diagonal branch   . Givens capsule study  04/16/10    SB erosions vs. AVMs, non-specific distal SB inflammation in setting ASA, plavix, NSAIDs  . Esophagogastroduodenoscopy  03/14/2012    Procedure: ESOPHAGOGASTRODUODENOSCOPY (EGD);  Surgeon: Iva Boop, MD;  Location: [facility] ENDOSCOPY;  Service: Endoscopy;  Laterality: N/A;  . Colonoscopy  03/16/2012    Procedure: COLONOSCOPY;  Surgeon: Iva Boop, MD;  Location: [facility] ENDOSCOPY;  Service: Endoscopy;  Laterality: N/A;  . Right renal artery, pta and stenting  08/22/1996    Dr [name], P-154 Palmaz stent mounted on a 6mm x 2cm PowerFlex and deployed in the renal ostium at 7 atmospheres  reduction of 95% lesion to 0% residual without dissection    . Enteroscopy N/A 07/06/2012    Procedure: ENTEROSCOPY;  Surgeon: Hilarie Fredrickson, MD;  Location: [facility] ENDOSCOPY;  Service: Endoscopy;  Laterality: N/A;    Family History   Problem Relation Age of Onset  . Coronary artery disease Father   . Hypertension Mother   . Coronary artery disease Mother   . Colon cancer Brother 34    History  Substance Use Topics  . Smoking status: Former Smoker -- 0.50 packs/day for 45 years    Types: Cigarettes    Quit date: 04/13/1992  . Smokeless tobacco: Current User    Types: Chew  . Alcohol Use: No      Review of Systems All other systems reviewed and are negative except as noted in HPI.   Allergies  Review of patient's allergies indicates no known allergies.  Home Medications   Current Outpatient Rx  Name  Route  Sig  Dispense  Refill  . allopurinol (ZYLOPRIM) 100 MG tablet   Oral   Take 50 mg by mouth 2 (two) times daily.          [name] amiodarone (PACERONE) 200 MG tablet   Oral   Take 200 mg by mouth at bedtime.          . isosorbide mononitrate (IMDUR) 30 MG 24 hr tablet   Oral   Take 30 mg by mouth at bedtime.         . metoprolol tartrate (LOPRESSOR) 25 MG tablet   Oral   Take 25 mg by mouth 2 (two) times daily.         . multivitamin (RENA-VIT) TABS tablet   Oral   Take 1 tablet by mouth every morning.          . nitroGLYCERIN (NITROSTAT) 0.4 MG SL tablet   Sublingual   Place 1 tablet (0.4 mg total) under the tongue every 5 (five) minutes as needed.   25 tablet   5   . pantoprazole (PROTONIX) 40 MG tablet   Oral   Take 40 mg by mouth 2 (two) times daily.          [name] PARoxetine (PAXIL) 20 MG tablet   Oral   Take 1 tablet (20 mg total) by mouth daily.         . rosuvastatin (CRESTOR) 10 MG tablet   Oral   Take 10 mg by mouth at bedtime.          . sevelamer (RENVELA) 800 MG tablet   Oral   Take 2,400 mg by mouth 3 (three) times daily with meals.           BP 118/52  Pulse 61  Temp(Src) 99.5 F (37.5 C) (Oral)  Resp 18  Ht 5\' 10"  (1.778 m)  Wt 170 lb (77.111 kg)  BMI 24.39 kg/m2  SpO2 97%  Physical Exam  Nursing note and vitals  reviewed. Constitutional: He is oriented to person, place, and time. He appears well-developed and well-nourished.  HENT:  Head: Normocephalic and atraumatic.  Eyes: EOM are normal. Pupils are equal, round, and reactive to light.  Neck: Normal range of motion. Neck supple.  Cardiovascular: Normal rate, normal heart sounds and intact distal pulses.   Pulmonary/Chest: Effort normal and breath sounds normal.  Abdominal: Bowel sounds are normal. He exhibits no distension. There is no tenderness.  Genitourinary:  Scant stool in rectum, no melena, heme positive  Musculoskeletal: Normal range of motion. He exhibits no edema and no tenderness.  Neurological: He is alert and oriented to person, place, and time. He has normal strength. No cranial nerve deficit or sensory deficit.  Skin: Skin is warm and dry. No rash noted.  Psychiatric: He has a normal mood and affect.    ED Course  Procedures (including critical care time)  Labs Reviewed  CBC WITH DIFFERENTIAL - Abnormal; Notable for the following:    RBC 2.48 (*)    Hemoglobin 8.8 (*)    HCT 26.5 (*)    MCV 106.9 (*)    MCH 35.5 (*)    RDW 22.1 (*)    All other components within normal limits  BASIC METABOLIC PANEL - Abnormal; Notable for the following:    CO2 34 (*)    Glucose, Bld 122 (*)    Creatinine, Ser 4.45 (*)    GFR calc non Af Amer 12 (*)    GFR calc Af Amer 14 (*)    All other components within normal limits  OCCULT BLOOD, POC DEVICE - Abnormal; Notable for the following:    Fecal Occult Bld POSITIVE (*)    All other components within normal limits  APTT  PROTIME-INR  TYPE AND SCREEN   No results found.   1. Anemia       MDM  Hgb here is 8.8. Pt states he thinks his stools have been 'clearing up'. Discussed with Dr. Arlean Hopping on call for Nephrology who agrees patient does not need admission or transfusion at this point. He will have Hgb checked again at Dialysis in 2 days. He also has follow-up with GI scheduled  in a week. Pt is hemodynamically stable. Diff not resulted from CBC but should not change management.         Michayla Mcneil B. Bernette Mayers, MD 07/19/12 1350

## 2012-07-19 NOTE — ED Notes (Signed)
Pt sent here from dialysis for hgb of 7.9 per pt.  Hx of admission for ulcer of small intestine 3 weeks ago.  Hx of 5 blood transfusions per wife.  Pt denies pain, but c/o weakness, dark stools and is pale.

## 2012-07-19 NOTE — ED Notes (Signed)
Pt alert and mentating appropriately upon d/c. Pt given d/c teaching and follow up care instructions. Pt verbalizes understanding and has no further questions upon d/c. Pt taken out via wheelchair with family. NAD noted upon d/c.

## 2012-07-19 NOTE — ED Notes (Signed)
Verified BP with Dr Bernette Mayers; pt stable for d/c at this time

## 2012-07-20 ENCOUNTER — Telehealth: Payer: Self-pay | Admitting: Internal Medicine

## 2012-07-20 NOTE — Telephone Encounter (Signed)
Left message for patient to call back  

## 2012-07-21 ENCOUNTER — Encounter (HOSPITAL_COMMUNITY): Payer: Medicare Other | Attending: Nephrology

## 2012-07-21 DIAGNOSIS — D649 Anemia, unspecified: Secondary | ICD-10-CM | POA: Insufficient documentation

## 2012-07-21 LAB — HEMOGLOBIN AND HEMATOCRIT, BLOOD
HCT: 27.4 % — ABNORMAL LOW (ref 39.0–52.0)
Hemoglobin: 8.7 g/dL — ABNORMAL LOW (ref 13.0–17.0)

## 2012-07-21 MED ORDER — SODIUM CHLORIDE 0.9 % IJ SOLN: 10.0000 mL | INTRAMUSCULAR | Status: AC | PRN

## 2012-07-21 MED ORDER — HEPARIN SOD (PORK) LOCK FLUSH 100 UNIT/ML IV SOLN: 250.0000 [IU] | INTRAVENOUS | Status: AC | PRN

## 2012-07-21 MED ORDER — HEPARIN SOD (PORK) LOCK FLUSH 100 UNIT/ML IV SOLN: 500.0000 [IU] | Freq: Every day | INTRAVENOUS | Status: AC | PRN

## 2012-07-21 MED ORDER — SODIUM CHLORIDE 0.9 % IJ SOLN: 3.0000 mL | INTRAMUSCULAR | Status: AC | PRN

## 2012-07-21 MED ORDER — SODIUM CHLORIDE 0.9 % IV SOLN: 250.0000 mL | Freq: Once | INTRAVENOUS | Status: AC

## 2012-07-21 NOTE — Telephone Encounter (Signed)
I spoke with his daughter today.  She states that at dialysis Tuesday his Hgb was 7. Something and he was sent to the ER.  ER reading 8.8, then again today Hgb 8.7.  She says that he has an order to 2 units of blood for tomorrow.  They will keep the appt for 07/26/12 with Dr. Leone Payor.  She will call back if anything changes

## 2012-07-22 ENCOUNTER — Encounter (HOSPITAL_COMMUNITY): Payer: Medicare Other

## 2012-07-26 ENCOUNTER — Ambulatory Visit (INDEPENDENT_AMBULATORY_CARE_PROVIDER_SITE_OTHER): Payer: Medicare Other | Admitting: Internal Medicine

## 2012-07-26 ENCOUNTER — Encounter: Payer: Self-pay | Admitting: Internal Medicine

## 2012-07-26 VITALS — BP 124/50 | HR 64 | Ht 68.25 in | Wt 170.0 lb

## 2012-07-26 DIAGNOSIS — Z9861 Coronary angioplasty status: Secondary | ICD-10-CM

## 2012-07-26 DIAGNOSIS — D62 Acute posthemorrhagic anemia: Secondary | ICD-10-CM

## 2012-07-26 DIAGNOSIS — I2510 Atherosclerotic heart disease of native coronary artery without angina pectoris: Secondary | ICD-10-CM

## 2012-07-26 DIAGNOSIS — K5521 Angiodysplasia of colon with hemorrhage: Secondary | ICD-10-CM

## 2012-07-26 MED ORDER — PRASUGREL HCL 10 MG PO TABS: 10.0000 mg | ORAL_TABLET | Freq: Every day | ORAL | Status: DC

## 2012-07-26 MED ORDER — PANTOPRAZOLE SODIUM 40 MG PO TBEC: 40.0000 mg | DELAYED_RELEASE_TABLET | Freq: Every day | ORAL | Status: DC

## 2012-07-26 NOTE — Patient Instructions (Addendum)
You have been given a separate informational sheet regarding your tobacco use, the importance of quitting and local resources to help you quit.  Decrease your pantoprazole to once a day per Dr. Leone Payor.  Resume the effient you have at home.  Make sure and follow up with Dr. [name].  Watch for any bleeding, notify us if this happens.  Thank you for choosing me and [facility].  Iva Boop, M.D., [facility]

## 2012-07-26 NOTE — Progress Notes (Signed)
Subjective:    Patient ID: Roger [name], male    DOB: Aug 14, 1940, 72 y.o.   MRN: [id number]  HPI The patient is here with his daughter after an admission to the hospital with recurrent GI bleeding. 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 No melena since that time though it took a few days to stop. Hgb has been stable in low 8 range though he recently had a possible low value not confirmed on recheck. Remains off prasugrel. Was to have a stress test but cancelled due to recent low Hgb scare.   No Known Allergies Outpatient Prescriptions Prior to Visit  Medication Sig Dispense Refill  . allopurinol (ZYLOPRIM) 100 MG tablet Take 50 mg by mouth 2 (two) times daily.       [name] amiodarone (PACERONE) 200 MG tablet Take 200 mg by mouth at bedtime.       . isosorbide mononitrate (IMDUR) 30 MG 24 hr tablet Take 30 mg by mouth at bedtime.      . metoprolol tartrate (LOPRESSOR) 25 MG tablet Take 25 mg by mouth 2 (two) times daily.      . multivitamin (RENA-VIT) TABS tablet Take 1 tablet by mouth every morning.       . nitroGLYCERIN (NITROSTAT) 0.4 MG SL tablet Place 1 tablet (0.4 mg total) under the tongue every 5 (five) minutes as needed.  25 tablet  5  . PARoxetine (PAXIL) 20 MG tablet Take 1 tablet (20 mg total) by mouth daily.      . rosuvastatin (CRESTOR) 10 MG tablet Take 10 mg by mouth at bedtime.       . sevelamer (RENVELA) 800 MG tablet Take 2,400 mg by mouth 3 (three) times daily with meals.      . pantoprazole (PROTONIX) 40 MG tablet Take 40 mg by mouth 2 (two) times daily.        Facility-Administered Medications Prior to Visit  Medication Dose Route Frequency Provider Last Rate Last Dose  . 0.9 %  sodium chloride infusion  250 mL Intravenous Once [name], MD      . 0.9 %  sodium chloride infusion  250 mL Intravenous Once [name], MD      . heparin lock flush 100 unit/mL  500 Units Intracatheter Daily PRN [name], MD      . heparin lock flush 100  unit/mL  250 Units Intracatheter PRN [name], MD      . heparin lock flush 100 unit/mL  500 Units Intracatheter Daily PRN [name], MD      . heparin lock flush 100 unit/mL  250 Units Intracatheter PRN [name], MD      . sodium chloride 0.9 % injection 10 mL  10 mL Intracatheter PRN [name], MD      . sodium chloride 0.9 % injection 10 mL  10 mL Intracatheter PRN [name], MD      . sodium chloride 0.9 % injection 3 mL  3 mL Intracatheter PRN [name], MD      . sodium chloride 0.9 % injection 3 mL  3 mL Intracatheter PRN [name], MD       Past Medical History  Diagnosis Date  . HTN (hypertension)   . Arthritis   . Hypercholesteremia   . MI (myocardial infarction) 03/16/2011    inferolateral ST segment elevation MI  . GERD (gastroesophageal reflux disease)   . Atrial  fibrillation     Paroxysmal - 2 episodes related to ischemic NSTEMI events  5 & 09/2011  . Gout   . S/P coronary artery stent placement, 10/01/11 with DES to mid LAD and  PTCA of Diag 2 10/01/2009    Inferior STEMI 2011 - BMS to LCx; NSTEMI 5/13 with ISR/thrombosis of Cx BMS -->multiple DES to  LCx ; staged PCI of LAD /Balloon PTCA of major Diag 09/2011 for Sx (NSTEMI)   . Anticoagulated on warfarin 10/06/2011  . Heart murmur   . Anemia   . Schatzki's ring   . Angiodysplasia of intestine   . Aortic stenosis, moderate     2D Echocardiogram 12/13 and 08/22/11 showed moderate aortic stenosis with valve area of 1.28 sq cm. EF of 45 to 50% with severe hypokinesia of the entire inferolateral myocardium  . ESRD on hemodialysis     [facility], TTS, esrd due to HTN  . Right renal artery stenosis 1998  . Ulcer    Past Surgical History  Procedure Laterality Date  . Ureteral stent placement    . Colonoscopy  04/2009    with EGD by Dr.Buccini moderately large hiatal hernia could contribute to his anemia,Schatzki ring which might accout for his intermittent dysphagia/+internal hemorrhoids  ;family hx of colon ca  . Esophagogastroduodenoscopy  03/2010    by Dr.Magod/small hiatal hernia with widley patent fibrous ring,minimal bulbitis otherwise nl  . Dialysis fistula creation    . Coronary angioplasty with stent placement  08/2011 & 09/2011    Integrity Resolute DES Stents  Stent #1:  2.22mm x 14 mm (extending to just distal to the bifurcation to the mid OM, Stent #2:  2.68mm x 18mm  Stent #3:  3.47mm x 18mm; covering proximal portion of the old stent into the proximal circumflex and overlapping stent #2 distally, Stent #4: 2.35mm x 8mm (4 overlapping in LCx- OM1; 1 in mid LAD; PTCA only of Major Diag (small caliber)  . Coronary angioplasty with stent placement  2011, 2012    Dr [name]  03/16/11  re-cannulized totally occluded circumflex, stented with Integrity bare metal stent focal 90% stenosis in LAD and diagonal branch   . Givens capsule study  04/16/10    SB erosions vs. AVMs, non-specific distal SB inflammation in setting ASA, plavix, NSAIDs  . Esophagogastroduodenoscopy  03/14/2012    Procedure: ESOPHAGOGASTRODUODENOSCOPY (EGD);  Surgeon: Iva Boop, MD;  Location: [facility] ENDOSCOPY;  Service: Endoscopy;  Laterality: N/A;  . Colonoscopy  03/16/2012    Procedure: COLONOSCOPY;  Surgeon: Iva Boop, MD;  Location: [facility] ENDOSCOPY;  Service: Endoscopy;  Laterality: N/A;  . Right renal artery, pta and stenting  08/22/1996    Dr [name], P-154 Palmaz stent mounted on a 6mm x 2cm PowerFlex and deployed in the renal ostium at 7 atmospheres  reduction of 95% lesion to 0% residual without dissection    . Enteroscopy N/A 07/06/2012    Procedure: ENTEROSCOPY;  Surgeon: Hilarie Fredrickson, MD;  Location: [facility] ENDOSCOPY;  Service: Endoscopy;  Laterality: N/A;    Review of Systems As above, + fatigue    Objective:   Physical Exam Chronically ill, NAD       Assessment & Plan:  Angiodysplasia of intestine with hemorrhage  Acute blood loss anemia  CAD S/P percutaneous coronary  intervention with drug-eluting stent   1. Improved overall 2. Resume Prasugrel as he has a drug-eluting stent. He is aware could have other AVM's that could bleed 3. Follow-up  Dr. [name] 4. See me/GI as needed 5. Iron/Epo tx through renal-Dr. Lowell Guitar 6. Reduce PPI to qd but stay on to decrease risk UGI bleed on anti-PLT agents   OZ:HYQMVHQ,IONGE G, MD, [name], MD, Casimiro Needle, MD

## 2012-08-09 ENCOUNTER — Other Ambulatory Visit (HOSPITAL_COMMUNITY): Payer: Self-pay | Admitting: Cardiovascular Disease

## 2012-08-09 DIAGNOSIS — I350 Nonrheumatic aortic (valve) stenosis: Secondary | ICD-10-CM

## 2012-08-17 ENCOUNTER — Ambulatory Visit (HOSPITAL_COMMUNITY)
Admission: RE | Admit: 2012-08-17 | Discharge: 2012-08-17 | Disposition: A | Discharge status: Home / Self Care | Payer: Medicare Other | Source: Ambulatory Visit | Attending: Cardiovascular Disease | Admitting: Cardiovascular Disease

## 2012-08-17 DIAGNOSIS — I350 Nonrheumatic aortic (valve) stenosis: Secondary | ICD-10-CM

## 2012-08-17 DIAGNOSIS — I379 Nonrheumatic pulmonary valve disorder, unspecified: Secondary | ICD-10-CM | POA: Insufficient documentation

## 2012-08-17 DIAGNOSIS — I10 Essential (primary) hypertension: Secondary | ICD-10-CM | POA: Insufficient documentation

## 2012-08-17 DIAGNOSIS — I079 Rheumatic tricuspid valve disease, unspecified: Secondary | ICD-10-CM | POA: Insufficient documentation

## 2012-08-17 DIAGNOSIS — R0989 Other specified symptoms and signs involving the circulatory and respiratory systems: Secondary | ICD-10-CM | POA: Insufficient documentation

## 2012-08-17 DIAGNOSIS — I2510 Atherosclerotic heart disease of native coronary artery without angina pectoris: Secondary | ICD-10-CM | POA: Insufficient documentation

## 2012-08-17 DIAGNOSIS — F17200 Nicotine dependence, unspecified, uncomplicated: Secondary | ICD-10-CM | POA: Insufficient documentation

## 2012-08-17 DIAGNOSIS — R0609 Other forms of dyspnea: Secondary | ICD-10-CM | POA: Insufficient documentation

## 2012-08-17 DIAGNOSIS — I080 Rheumatic disorders of both mitral and aortic valves: Secondary | ICD-10-CM | POA: Insufficient documentation

## 2012-08-17 NOTE — Progress Notes (Signed)
2D Echo Performed 08/17/2012    Charleigh Correnti, RCS  

## 2012-10-11 ENCOUNTER — Other Ambulatory Visit: Payer: Self-pay | Admitting: *Deleted

## 2012-10-11 MED ORDER — AMIODARONE HCL 200 MG PO TABS: 200.0000 mg | ORAL_TABLET | Freq: Every day | ORAL | Status: DC

## 2012-10-12 ENCOUNTER — Telehealth: Payer: Self-pay | Admitting: *Deleted

## 2012-10-12 NOTE — Telephone Encounter (Signed)
Pt has a follow up appt with Dr [name] on 7/30.  Dr [name] told me that patient could wait until then for the follow up office visit.  No answer at patient's home so letter was mailed with echo results

## 2012-10-12 NOTE — Telephone Encounter (Signed)
Message copied by Marella Bile on Wed Oct 12, 2012 10:26 AM ------      Message from: Runell Gess      Created: Sun Oct 02, 2012  2:38 PM       Worsening AS. Needs ROV with me ------

## 2012-10-31 ENCOUNTER — Ambulatory Visit: Payer: Medicare Other | Admitting: Cardiovascular Disease

## 2012-11-07 ENCOUNTER — Encounter: Payer: Self-pay | Admitting: *Deleted

## 2012-11-08 ENCOUNTER — Encounter: Payer: Self-pay | Admitting: Cardiovascular Disease

## 2012-11-09 ENCOUNTER — Encounter: Payer: Self-pay | Admitting: Cardiovascular Disease

## 2012-11-09 ENCOUNTER — Ambulatory Visit (INDEPENDENT_AMBULATORY_CARE_PROVIDER_SITE_OTHER): Payer: Medicare Other | Admitting: Cardiovascular Disease

## 2012-11-09 VITALS — BP 126/64 | HR 50 | Ht 68.0 in | Wt 175.0 lb

## 2012-11-09 DIAGNOSIS — I480 Paroxysmal atrial fibrillation: Secondary | ICD-10-CM

## 2012-11-09 DIAGNOSIS — I359 Nonrheumatic aortic valve disorder, unspecified: Secondary | ICD-10-CM

## 2012-11-09 DIAGNOSIS — I350 Nonrheumatic aortic (valve) stenosis: Secondary | ICD-10-CM

## 2012-11-09 DIAGNOSIS — I4891 Unspecified atrial fibrillation: Secondary | ICD-10-CM

## 2012-11-09 DIAGNOSIS — R0602 Shortness of breath: Secondary | ICD-10-CM

## 2012-11-09 DIAGNOSIS — I2510 Atherosclerotic heart disease of native coronary artery without angina pectoris: Secondary | ICD-10-CM

## 2012-11-09 MED ORDER — PRASUGREL HCL 10 MG PO TABS: 10.0000 mg | ORAL_TABLET | Freq: Every day | ORAL | Status: DC

## 2012-11-09 NOTE — Assessment & Plan Note (Signed)
Recent 2-D echo revealed preserved left jugular function with an aortic valve area measured at 0.8-70 m. This is reduced from the previous echo 6 months previously which showed a valve area of 1.2 cm. He does have some fatigue but denies chest pain or shortness of breath. We'll continue to follow his to the echocardiograms. Discussed options of aortic valve replacement with the patient and his daughter including standard aortic valve replacement using surgical techniques versus T. AVR.

## 2012-11-09 NOTE — Assessment & Plan Note (Signed)
On Effient . He's had no recurrent chest pain.

## 2012-11-09 NOTE — Patient Instructions (Signed)
Your physician recommends that you schedule a follow-up appointment in: 6 month  Your physician has requested that you have an echocardiogram. Echocardiography is a painless test that uses sound waves to create images of your heart. It provides your doctor with information about the size and shape of your heart and how well your heart's chambers and valves are working. This procedure takes approximately one hour. There are no restrictions for this procedure.  Your physician has recommended that you have a pulmonary function test. Pulmonary Function Tests are a group of tests that measure how well air moves in and out of your lungs.

## 2012-11-09 NOTE — Progress Notes (Signed)
11/09/2012 Roger [name]   1940/06/12  [id number]  Primary Physician [name], MD Primary Cardiologist: Runell Gess MD Roger [name]   HPI:  He is a 72 year old married Caucasian male father of 3, grandfather of 4 grandchildren, accompanied by his daughter today. I saw him approximately 3 weeks ago. He has a history of ischemic heart disease, status post multiple interventions in the past. He also has chronic renal insufficiency, on hemodialysis, hypertension, and hyperlipidemia. He has had AFib in the past and has mild to moderate aortic stenosis. He was on aspirin, Effient, and Coumadin and had a GI bleed and had endoscopy by Dr. Leone Payor from above and below with no source found. Ultimately he was transfused 4 units of packed red blood cells. His hemoglobin improved and his bleeding resolved spontaneously. He was taken off his aspirin and his Coumadin, maintaining sinus rhythm.I last saw him in the office in December 2013. He was admitted in March with recurrent GI bleed. His antiplatelet agent was held for one month and restarted. He denies chest pain or shortness of breath. He does have a recent 2-D echo was suggested progression of his aortic stenosis now with a valve area of 0.8 cm.     Current Outpatient Prescriptions  Medication Sig Dispense Refill  . allopurinol (ZYLOPRIM) 100 MG tablet Take 50 mg by mouth 2 (two) times daily.       [name] amiodarone (PACERONE) 200 MG tablet Take 1 tablet (200 mg total) by mouth at bedtime.  30 tablet  1  . isosorbide mononitrate (IMDUR) 30 MG 24 hr tablet Take 30 mg by mouth at bedtime.      . metoprolol tartrate (LOPRESSOR) 25 MG tablet Take 25 mg by mouth 2 (two) times daily.      . multivitamin (RENA-VIT) TABS tablet Take 1 tablet by mouth every morning.       . nitroGLYCERIN (NITROSTAT) 0.4 MG SL tablet Place 1 tablet (0.4 mg total) under the tongue every 5 (five) minutes as needed.  25 tablet  5  . pantoprazole (PROTONIX) 40 MG  tablet Take 1 tablet (40 mg total) by mouth daily before breakfast.      . PARoxetine (PAXIL) 20 MG tablet Take 20 mg by mouth daily.      . prasugrel (EFFIENT) 10 MG TABS Take 10 mg by mouth daily.      . prasugrel (EFFIENT) 10 MG TABS Take 1 tablet (10 mg total) by mouth daily.  90 tablet  3  . rosuvastatin (CRESTOR) 10 MG tablet Take 10 mg by mouth at bedtime.       . sevelamer (RENVELA) 800 MG tablet Take 2,400 mg by mouth 3 (three) times daily with meals.       No current facility-administered medications for this visit.   Facility-Administered Medications Ordered in Other Visits  Medication Dose Route Frequency Provider Last Rate Last Dose  . 0.9 %  sodium chloride infusion  250 mL Intravenous Once [name], MD      . 0.9 %  sodium chloride infusion  250 mL Intravenous Once [name], MD      . heparin lock flush 100 unit/mL  500 Units Intracatheter Daily PRN [name], MD      . heparin lock flush 100 unit/mL  250 Units Intracatheter PRN [name], MD      . heparin lock flush 100 unit/mL  500 Units Intracatheter Daily PRN [name], MD      .  heparin lock flush 100 unit/mL  250 Units Intracatheter PRN [name], MD      . sodium chloride 0.9 % injection 10 mL  10 mL Intracatheter PRN [name], MD      . sodium chloride 0.9 % injection 10 mL  10 mL Intracatheter PRN [name], MD      . sodium chloride 0.9 % injection 3 mL  3 mL Intracatheter PRN [name], MD      . sodium chloride 0.9 % injection 3 mL  3 mL Intracatheter PRN [name], MD        No Known Allergies  History   Social History  . Marital Status: Married    Spouse Name: N/A    Number of Children: 3  . Years of Education: N/A   Occupational History  . retired    Social History Main Topics  . Smoking status: Former Smoker -- 0.50 packs/day for 45 years    Types: Cigarettes    Quit date: 04/13/1992  . Smokeless tobacco: Current User    Types: Chew  .  Alcohol Use: No  . Drug Use: No  . Sexually Active: No   Other Topics Concern  . 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      Review of Systems: General: negative for chills, fever, night sweats or weight changes.  Cardiovascular: negative for chest pain, dyspnea on exertion, edema, orthopnea, palpitations, paroxysmal nocturnal dyspnea or shortness of breath Dermatological: negative for rash Respiratory: negative for cough or wheezing Urologic: negative for hematuria Abdominal: negative for nausea, vomiting, diarrhea, bright red blood per rectum, melena, or hematemesis Neurologic: negative for visual changes, syncope, or dizziness All other systems reviewed and are otherwise negative except as noted above.    Blood pressure 126/64, pulse 50, height 5\' 8"  (1.727 m), weight 175 lb (79.379 kg).  General appearance: alert and no distress Neck: no adenopathy, no JVD, supple, symmetrical, trachea midline, thyroid not enlarged, symmetric, no tenderness/mass/nodules and bilateral carotid bruits versus a transmitted murmur from his aortic stenosis Lungs: clear to auscultation bilaterally Heart: typical murmur of aortic stenosis Extremities: extremities normal, atraumatic, no cyanosis or edema  EKG sinus bradycardia at 51 without ST or T wave changes  ASSESSMENT AND PLAN:   S/P coronary artery stent placement, 08/2011 -- 4 overlapping DES to LCx-OM for ~occluded (ISR/thrombosis) BMS to LCx-OM; 10/01/11 with DES to mid LAD and  PTCA of Diag 2 On Effient . He's had no recurrent chest pain.  Aortic stenosis, moderate, December 2013 Recent 2-D echo revealed preserved left jugular function with an aortic valve area measured at 0.8-70 m. This is reduced from the previous echo 6 months previously which showed a valve area of 1.2 cm. He does have some fatigue but denies chest pain or shortness of breath. We'll continue to follow his to the  echocardiograms. Discussed options of aortic valve replacement with the patient and his daughter including standard aortic valve replacement using surgical techniques versus T. AVR.      Runell Gess MD FACP,FACC,FAHA, [facility] 11/09/2012 11:18 AM

## 2012-12-19 ENCOUNTER — Encounter (INDEPENDENT_AMBULATORY_CARE_PROVIDER_SITE_OTHER): Payer: Medicare Other

## 2012-12-19 DIAGNOSIS — R0602 Shortness of breath: Secondary | ICD-10-CM

## 2013-01-11 ENCOUNTER — Other Ambulatory Visit: Payer: Self-pay | Admitting: Cardiovascular Disease

## 2013-02-20 ENCOUNTER — Other Ambulatory Visit: Payer: Self-pay | Admitting: *Deleted

## 2013-02-20 ENCOUNTER — Ambulatory Visit (HOSPITAL_COMMUNITY)
Admission: RE | Admit: 2013-02-20 | Discharge: 2013-02-20 | Disposition: A | Discharge status: Home / Self Care | Payer: Medicare Other | Source: Ambulatory Visit | Attending: Cardiovascular Disease | Admitting: Cardiovascular Disease

## 2013-02-20 ENCOUNTER — Telehealth: Payer: Self-pay | Admitting: Cardiovascular Disease

## 2013-02-20 DIAGNOSIS — I350 Nonrheumatic aortic (valve) stenosis: Secondary | ICD-10-CM

## 2013-02-20 DIAGNOSIS — I4891 Unspecified atrial fibrillation: Secondary | ICD-10-CM | POA: Insufficient documentation

## 2013-02-20 DIAGNOSIS — I359 Nonrheumatic aortic valve disorder, unspecified: Secondary | ICD-10-CM

## 2013-02-20 DIAGNOSIS — I2510 Atherosclerotic heart disease of native coronary artery without angina pectoris: Secondary | ICD-10-CM | POA: Insufficient documentation

## 2013-02-20 DIAGNOSIS — I480 Paroxysmal atrial fibrillation: Secondary | ICD-10-CM

## 2013-02-20 MED ORDER — PRASUGREL HCL 10 MG PO TABS: 10.0000 mg | ORAL_TABLET | Freq: Every day | ORAL | Status: DC

## 2013-02-20 NOTE — Progress Notes (Signed)
2D Echo Performed 02/20/2013    Sherel Fennell, RCS  

## 2013-02-20 NOTE — Telephone Encounter (Signed)
I am unsure who called the patient. I spoke with the patient.

## 2013-02-20 NOTE — Telephone Encounter (Signed)
Message forwarded to K. Petra Kuba, RN as call not generated from triage.

## 2013-02-20 NOTE — Telephone Encounter (Signed)
Said someone called him. Didn't know who  Was wondering if it was about the echo test he had today.  Please call

## 2013-02-20 NOTE — Telephone Encounter (Signed)
RX printed for Effient so that patiet can send to pharmacy assistance.

## 2013-02-21 ENCOUNTER — Telehealth: Payer: Self-pay | Admitting: *Deleted

## 2013-02-21 ENCOUNTER — Encounter: Payer: Self-pay | Admitting: *Deleted

## 2013-02-21 DIAGNOSIS — I350 Nonrheumatic aortic (valve) stenosis: Secondary | ICD-10-CM

## 2013-02-21 NOTE — Telephone Encounter (Signed)
Order placed for repeat echo in 1 year 

## 2013-02-21 NOTE — Telephone Encounter (Signed)
Message copied by Marella Bile on Tue Feb 21, 2013 11:49 AM ------      Message from: Runell Gess      Created: Tue Feb 21, 2013  6:25 AM       No change from prior study. Repeat in 12 months. Mod AS/AI, Nl LV fxn ------

## 2013-03-13 ENCOUNTER — Telehealth: Payer: Self-pay | Admitting: Cardiovascular Disease

## 2013-03-13 MED ORDER — ROSUVASTATIN CALCIUM 5 MG PO TABS: 10.0000 mg | ORAL_TABLET | Freq: Every day | ORAL | Status: DC

## 2013-03-13 NOTE — Telephone Encounter (Signed)
Would like some samples of Crestor 10 mg please.

## 2013-03-13 NOTE — Telephone Encounter (Signed)
Samples left at front desk & daughter contacted to inform ready for pickup

## 2013-04-14 ENCOUNTER — Other Ambulatory Visit (HOSPITAL_COMMUNITY): Payer: Self-pay | Admitting: Cardiovascular Disease

## 2013-04-14 DIAGNOSIS — I771 Stricture of artery: Secondary | ICD-10-CM

## 2013-04-28 ENCOUNTER — Ambulatory Visit (HOSPITAL_COMMUNITY)
Admission: RE | Admit: 2013-04-28 | Discharge: 2013-04-28 | Disposition: A | Discharge status: Home / Self Care | Payer: Medicare Other | Source: Ambulatory Visit | Attending: Cardiovascular Disease | Admitting: Cardiovascular Disease

## 2013-04-28 DIAGNOSIS — I771 Stricture of artery: Secondary | ICD-10-CM

## 2013-04-28 DIAGNOSIS — I70209 Unspecified atherosclerosis of native arteries of extremities, unspecified extremity: Secondary | ICD-10-CM | POA: Insufficient documentation

## 2013-04-28 NOTE — Progress Notes (Signed)
Lower Extremity Arterial Duplex Completed. °Brianna L Mazza,RVT °

## 2013-05-08 ENCOUNTER — Telehealth: Payer: Self-pay | Admitting: *Deleted

## 2013-05-08 ENCOUNTER — Encounter: Payer: Self-pay | Admitting: *Deleted

## 2013-05-08 DIAGNOSIS — I739 Peripheral vascular disease, unspecified: Secondary | ICD-10-CM

## 2013-05-08 NOTE — Telephone Encounter (Signed)
Order placed for repeat lower ext dopplers in 1 year 

## 2013-05-08 NOTE — Telephone Encounter (Signed)
Message copied by Marella BileVOGEL, Genae Strine W. on Mon May 08, 2013  4:08 PM ------      Message from: Runell GessBERRY, JONATHAN J      Created: Sun May 07, 2013 10:38 PM       Moderate iliac disease. Repeat in 12 months ------

## 2013-05-19 ENCOUNTER — Ambulatory Visit (INDEPENDENT_AMBULATORY_CARE_PROVIDER_SITE_OTHER): Payer: Medicare Other | Admitting: Cardiovascular Disease

## 2013-05-19 ENCOUNTER — Encounter: Payer: Self-pay | Admitting: Cardiovascular Disease

## 2013-05-19 VITALS — BP 110/62 | HR 64 | Ht 68.0 in | Wt 184.0 lb

## 2013-05-19 DIAGNOSIS — I350 Nonrheumatic aortic (valve) stenosis: Secondary | ICD-10-CM

## 2013-05-19 DIAGNOSIS — I10 Essential (primary) hypertension: Secondary | ICD-10-CM

## 2013-05-19 DIAGNOSIS — I4891 Unspecified atrial fibrillation: Secondary | ICD-10-CM

## 2013-05-19 DIAGNOSIS — I739 Peripheral vascular disease, unspecified: Secondary | ICD-10-CM

## 2013-05-19 DIAGNOSIS — Z79899 Other long term (current) drug therapy: Secondary | ICD-10-CM

## 2013-05-19 DIAGNOSIS — E785 Hyperlipidemia, unspecified: Secondary | ICD-10-CM

## 2013-05-19 DIAGNOSIS — I480 Paroxysmal atrial fibrillation: Secondary | ICD-10-CM

## 2013-05-19 DIAGNOSIS — I2510 Atherosclerotic heart disease of native coronary artery without angina pectoris: Secondary | ICD-10-CM

## 2013-05-19 DIAGNOSIS — I359 Nonrheumatic aortic valve disorder, unspecified: Secondary | ICD-10-CM

## 2013-05-19 NOTE — Assessment & Plan Note (Signed)
Currently in normal sinus rhythm off of oral anticoagulation because of GI bleeding in the past

## 2013-05-19 NOTE — Assessment & Plan Note (Signed)
[name] has known CAD status post multiple interventions in the past. I stented his mid circumflex and interplasty thistles certain circumflex 03/14/10. His have moderate LAD and diagonal branch disease. Also documented an occluded right renal artery stent at 50% bilateral common iliac artery stenoses. His EF at that time was 45% with mild intrahepatic lesion. Dr. Bryan Lemmaavid Harding has performed multiple interventions since including his LAD and diagonal branch. He came in with a non-STEMI 09/30/11. He is on antiplatelet therapy. He denies chest pain or shortness of breath.

## 2013-05-19 NOTE — Assessment & Plan Note (Signed)
The patient had a 2-D echocardiogram performed in November of last year revealing normal LV systolic function with mild to moderate AI and moderate left ear. He does not appear to be symptomatic from this. We'll continue to follow him on an annual basis by 2-D echocardiogram

## 2013-05-19 NOTE — Assessment & Plan Note (Signed)
His most recent peripheral angiogram  performed at the time of cath in 2013 revealed  bilateral common iliac artery stenosis. His most recent arterial Dopplers suggested that these are widely patent without significant obstructive disease. He denies claudication.

## 2013-05-19 NOTE — Progress Notes (Signed)
05/19/2013 Roger [name]   [birth year]  [id number]  Primary Physician Alice ReichertMCINNIS,ANGUS G, MD Primary Cardiologist: Runell GessJonathan J. Samia Kukla MD Roseanne RenoFACP,FACC,FAHA, FSCAI   HPI:  He is a 73 year old married Caucasian male father of 3, grandfather of 4 grandchildren, accompanied by his daughter today. I saw him approximately 3 weeks ago. He has a history of ischemic heart disease, status post multiple interventions in the past. He also has chronic renal insufficiency, on hemodialysis, hypertension, and hyperlipidemia. He has had AFib in the past and has mild to moderate aortic stenosis. He was on aspirin, Effient, and Coumadin and had a GI bleed and had endoscopy by [name] from above and below with no source found. Ultimately he was transfused 4 units of packed red blood cells. His hemoglobin improved and his bleeding resolved spontaneously. He was taken off his aspirin and his Coumadin, maintaining sinus rhythm.I last saw him in the office in December 2013. He was admitted in March with recurrent GI bleed. His antiplatelet agent was held for one month and restarted. He denies chest pain or shortness of breath. He does have a recent 2-D echo was suggested progression of his aortic stenosis now with a valve area of 0.8 cm.since I saw him 6 months ago he has been relatively asymptomatic specifically denying chest pain or shortness of breath. Recent lower extremity artery Doppler studies showed normal ABIs with progression of his disease in the iliac arteries. Since I saw him in the office 6 months ago he's been relatively asymptomatic.    Current Outpatient Prescriptions  Medication Sig Dispense Refill  . allopurinol (ZYLOPRIM) 100 MG tablet Take 50 mg by mouth 2 (two) times daily.       [name]. amiodarone (PACERONE) 200 MG tablet Take 1 tablet (200 mg total) by mouth at bedtime.  30 tablet  5  . calcium-vitamin D (OSCAL WITH D) 500-200 MG-UNIT per tablet Take 1 tablet by mouth daily with breakfast.      .  cinacalcet (SENSIPAR) 30 MG tablet Take 30 mg by mouth daily.      . isosorbide mononitrate (IMDUR) 30 MG 24 hr tablet Take 30 mg by mouth at bedtime.      . metoprolol tartrate (LOPRESSOR) 25 MG tablet Take 12.5 mg by mouth 2 (two) times daily.       . multivitamin (RENA-VIT) TABS tablet Take 1 tablet by mouth every morning.       . nitroGLYCERIN (NITROSTAT) 0.4 MG SL tablet Place 1 tablet (0.4 mg total) under the tongue every 5 (five) minutes as needed.  25 tablet  5  . pantoprazole (PROTONIX) 40 MG tablet Take 1 tablet (40 mg total) by mouth daily before breakfast.      . PARoxetine (PAXIL) 20 MG tablet Take 20 mg by mouth daily.      . prasugrel (EFFIENT) 10 MG TABS tablet Take 1 tablet (10 mg total) by mouth daily.  90 tablet  3  . rosuvastatin (CRESTOR) 10 MG tablet Take 10 mg by mouth at bedtime.       . sevelamer (RENVELA) 800 MG tablet Take 2,400 mg by mouth 3 (three) times daily with meals.       No current facility-administered medications for this visit.   Facility-Administered Medications Ordered in Other Visits  Medication Dose Route Frequency Provider Last Rate Last Dose  . 0.9 %  sodium chloride infusion  250 mL Intravenous Once Lauris PoagAlvin C Powell, MD      . 0.9 %  sodium chloride infusion  250 mL Intravenous Once [name], MD      . heparin lock flush 100 unit/mL  500 Units Intracatheter Daily PRN [name], MD      . heparin lock flush 100 unit/mL  250 Units Intracatheter PRN [name], MD      . heparin lock flush 100 unit/mL  500 Units Intracatheter Daily PRN [name], MD      . heparin lock flush 100 unit/mL  250 Units Intracatheter PRN [name], MD      . sodium chloride 0.9 % injection 10 mL  10 mL Intracatheter PRN [name], MD      . sodium chloride 0.9 % injection 10 mL  10 mL Intracatheter PRN [name], MD      . sodium chloride 0.9 % injection 3 mL  3 mL Intracatheter PRN [name], MD      . sodium chloride 0.9 % injection  3 mL  3 mL Intracatheter PRN [name], MD        No Known Allergies  History   Social History  . Marital Status: Married    Spouse Name: N/A    Number of Children: 3  . Years of Education: N/A   Occupational History  . retired    Social History Main Topics  . Smoking status: Former Smoker -- 0.50 packs/day for 45 years    Types: Cigarettes    Quit date: 04/13/1992  . Smokeless tobacco: Current User    Types: Chew  . Alcohol Use: No  . Drug Use: No  . Sexual Activity: No   Other Topics Concern  . 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      Review of Systems: General: negative for chills, fever, night sweats or weight changes.  Cardiovascular: negative for chest pain, dyspnea on exertion, edema, orthopnea, palpitations, paroxysmal nocturnal dyspnea or shortness of breath Dermatological: negative for rash Respiratory: negative for cough or wheezing Urologic: negative for hematuria Abdominal: negative for nausea, vomiting, diarrhea, bright red blood per rectum, melena, or hematemesis Neurologic: negative for visual changes, syncope, or dizziness All other systems reviewed and are otherwise negative except as noted above.    Blood pressure 110/62, pulse 64, height 5\' 8"  (1.727 m), weight 184 lb (83.462 kg).  General appearance: alert and no distress Neck: no adenopathy, no JVD, supple, symmetrical, trachea midline, thyroid: normal to inspection and palpation and soft left carotid bruit Lungs: clear to auscultation bilaterally Heart: regular rate and rhythm, S1, S2 normal, no murmur, click, rub or gallop Extremities: extremities normal, atraumatic, no cyanosis or edema  EKG normal sinus rhythm at 64 with lateral T-wave inversion  ASSESSMENT AND PLAN:   CAD, MI CFX BMS 12/11 with ISR DES (as well as OM) May 2013 [name] has known CAD status post multiple interventions in the past. I stented his mid  circumflex and interplasty thistles certain circumflex 03/14/10. His have moderate LAD and diagonal branch disease. Also documented an occluded right renal artery stent at 50% bilateral common iliac artery stenoses. His EF at that time was 45% with mild intrahepatic lesion. Dr. Bryan Lemma has performed multiple interventions since including his LAD and diagonal branch. He came in with a non-STEMI 09/30/11. He is on antiplatelet therapy. He denies chest pain or shortness of breath.  HTN (hypertension) Well-controlled on current medications  PAF (paroxysmal atrial fibrillation) Currently in normal sinus rhythm off of oral anticoagulation because of GI bleeding in the past  PAD (peripheral artery disease): 60% bilateral common iliac stenosis  His most recent peripheral angiogram  performed at the time of cath in 2013 revealed  bilateral common iliac artery stenosis. His most recent arterial Dopplers suggested that these are widely patent without significant obstructive disease. He denies claudication.  Aortic stenosis, moderate, December 2013 The patient had a 2-D echocardiogram performed in November of last year revealing normal LV systolic function with mild to moderate AI and moderate left ear. He does not appear to be symptomatic from this. We'll continue to follow him on an annual basis by 2-D echocardiogram      Runell Gess MD [facility], [facility] 05/19/2013 10:25 AM

## 2013-05-19 NOTE — Assessment & Plan Note (Signed)
Well-controlled on current medications 

## 2013-05-19 NOTE — Patient Instructions (Signed)
  We will see you back in follow up in 6 months with an extender and 1 year with Dr Allyson SabalBerry  Dr Allyson SabalBerry has ordered blood work to be done, fasting.   You will be scheduled for an echocardiogram to be done in November 2015

## 2013-06-05 ENCOUNTER — Telehealth: Payer: Self-pay | Admitting: *Deleted

## 2013-06-05 NOTE — Telephone Encounter (Signed)
Form faxed with medication list, allergies and diagnosis list as requested.

## 2013-06-12 ENCOUNTER — Other Ambulatory Visit: Payer: Self-pay

## 2013-06-12 MED ORDER — METOPROLOL TARTRATE 25 MG PO TABS: 12.5000 mg | ORAL_TABLET | Freq: Two times a day (BID) | ORAL | Status: DC

## 2013-06-12 NOTE — Telephone Encounter (Signed)
Rx was sent to pharmacy electronically. 

## 2013-06-14 ENCOUNTER — Telehealth: Payer: Self-pay | Admitting: Cardiovascular Disease

## 2013-06-14 MED ORDER — METOPROLOL TARTRATE 25 MG PO TABS: 12.5000 mg | ORAL_TABLET | Freq: Two times a day (BID) | ORAL | Status: DC

## 2013-06-14 NOTE — Telephone Encounter (Signed)
Need refill on Metoprolol 25 mg # 180-Pt is there waiting.

## 2013-06-14 NOTE — Telephone Encounter (Signed)
Spoke to [facility] pharm tech- Informed patient has metoprolol tart 12.5 mg  (1/2 tablet of 25 mg)  Twice a day # OF 90  X 3 REFILL

## 2013-06-28 ENCOUNTER — Other Ambulatory Visit: Payer: Self-pay | Admitting: *Deleted

## 2013-06-28 MED ORDER — ISOSORBIDE MONONITRATE ER 30 MG PO TB24: 30.0000 mg | ORAL_TABLET | Freq: Every day | ORAL | Status: DC

## 2013-07-12 DIAGNOSIS — J189 Pneumonia, unspecified organism: Secondary | ICD-10-CM

## 2013-07-12 HISTORY — DX: Pneumonia, unspecified organism: J18.9

## 2013-07-15 ENCOUNTER — Emergency Department (HOSPITAL_COMMUNITY): Payer: Medicare Other

## 2013-07-15 ENCOUNTER — Inpatient Hospital Stay (HOSPITAL_COMMUNITY)
Admission: EM | Admit: 2013-07-15 | Discharge: 2013-07-21 | DRG: 193 | Disposition: A | Discharge status: Home / Self Care | Payer: Medicare Other | Attending: Family Medicine | Admitting: Family Medicine

## 2013-07-15 ENCOUNTER — Encounter (HOSPITAL_COMMUNITY): Payer: Self-pay | Admitting: Emergency Medicine

## 2013-07-15 ENCOUNTER — Inpatient Hospital Stay (HOSPITAL_COMMUNITY): Payer: Medicare Other

## 2013-07-15 DIAGNOSIS — J9600 Acute respiratory failure, unspecified whether with hypoxia or hypercapnia: Secondary | ICD-10-CM | POA: Diagnosis present

## 2013-07-15 DIAGNOSIS — Z9861 Coronary angioplasty status: Secondary | ICD-10-CM

## 2013-07-15 DIAGNOSIS — I739 Peripheral vascular disease, unspecified: Secondary | ICD-10-CM

## 2013-07-15 DIAGNOSIS — I120 Hypertensive chronic kidney disease with stage 5 chronic kidney disease or end stage renal disease: Secondary | ICD-10-CM | POA: Diagnosis present

## 2013-07-15 DIAGNOSIS — Z87891 Personal history of nicotine dependence: Secondary | ICD-10-CM

## 2013-07-15 DIAGNOSIS — J189 Pneumonia, unspecified organism: Principal | ICD-10-CM | POA: Diagnosis present

## 2013-07-15 DIAGNOSIS — Z800 Family history of malignant neoplasm of digestive organs: Secondary | ICD-10-CM

## 2013-07-15 DIAGNOSIS — I959 Hypotension, unspecified: Secondary | ICD-10-CM | POA: Diagnosis present

## 2013-07-15 DIAGNOSIS — D539 Nutritional anemia, unspecified: Secondary | ICD-10-CM | POA: Diagnosis present

## 2013-07-15 DIAGNOSIS — E785 Hyperlipidemia, unspecified: Secondary | ICD-10-CM | POA: Diagnosis present

## 2013-07-15 DIAGNOSIS — I4891 Unspecified atrial fibrillation: Secondary | ICD-10-CM

## 2013-07-15 DIAGNOSIS — Z955 Presence of coronary angioplasty implant and graft: Secondary | ICD-10-CM

## 2013-07-15 DIAGNOSIS — Z992 Dependence on renal dialysis: Secondary | ICD-10-CM

## 2013-07-15 DIAGNOSIS — I2510 Atherosclerotic heart disease of native coronary artery without angina pectoris: Secondary | ICD-10-CM

## 2013-07-15 DIAGNOSIS — Z79899 Other long term (current) drug therapy: Secondary | ICD-10-CM

## 2013-07-15 DIAGNOSIS — Z8249 Family history of ischemic heart disease and other diseases of the circulatory system: Secondary | ICD-10-CM

## 2013-07-15 DIAGNOSIS — K219 Gastro-esophageal reflux disease without esophagitis: Secondary | ICD-10-CM | POA: Diagnosis present

## 2013-07-15 DIAGNOSIS — R001 Bradycardia, unspecified: Secondary | ICD-10-CM

## 2013-07-15 DIAGNOSIS — D638 Anemia in other chronic diseases classified elsewhere: Secondary | ICD-10-CM | POA: Diagnosis present

## 2013-07-15 DIAGNOSIS — I252 Old myocardial infarction: Secondary | ICD-10-CM

## 2013-07-15 DIAGNOSIS — M129 Arthropathy, unspecified: Secondary | ICD-10-CM | POA: Diagnosis present

## 2013-07-15 DIAGNOSIS — I350 Nonrheumatic aortic (valve) stenosis: Secondary | ICD-10-CM

## 2013-07-15 DIAGNOSIS — E7800 Pure hypercholesterolemia, unspecified: Secondary | ICD-10-CM | POA: Diagnosis present

## 2013-07-15 DIAGNOSIS — E8809 Other disorders of plasma-protein metabolism, not elsewhere classified: Secondary | ICD-10-CM | POA: Diagnosis present

## 2013-07-15 DIAGNOSIS — Q2733 Arteriovenous malformation of digestive system vessel: Secondary | ICD-10-CM

## 2013-07-15 DIAGNOSIS — I359 Nonrheumatic aortic valve disorder, unspecified: Secondary | ICD-10-CM | POA: Diagnosis present

## 2013-07-15 DIAGNOSIS — K5520 Angiodysplasia of colon without hemorrhage: Secondary | ICD-10-CM

## 2013-07-15 DIAGNOSIS — I10 Essential (primary) hypertension: Secondary | ICD-10-CM

## 2013-07-15 DIAGNOSIS — J40 Bronchitis, not specified as acute or chronic: Secondary | ICD-10-CM | POA: Diagnosis present

## 2013-07-15 DIAGNOSIS — N189 Chronic kidney disease, unspecified: Secondary | ICD-10-CM

## 2013-07-15 DIAGNOSIS — N039 Chronic nephritic syndrome with unspecified morphologic changes: Secondary | ICD-10-CM

## 2013-07-15 DIAGNOSIS — N2581 Secondary hyperparathyroidism of renal origin: Secondary | ICD-10-CM | POA: Diagnosis present

## 2013-07-15 DIAGNOSIS — D631 Anemia in chronic kidney disease: Secondary | ICD-10-CM

## 2013-07-15 DIAGNOSIS — N186 End stage renal disease: Secondary | ICD-10-CM

## 2013-07-15 LAB — CBC WITH DIFFERENTIAL/PLATELET
BASOS ABS: 0 10*3/uL (ref 0.0–0.1)
Basophils Relative: 0 % (ref 0–1)
Eosinophils Absolute: 0.1 10*3/uL (ref 0.0–0.7)
Eosinophils Relative: 1 % (ref 0–5)
HCT: 37.6 % — ABNORMAL LOW (ref 39.0–52.0)
HEMOGLOBIN: 12.3 g/dL — AB (ref 13.0–17.0)
LYMPHS ABS: 1.3 10*3/uL (ref 0.7–4.0)
Lymphocytes Relative: 19 % (ref 12–46)
MCH: 37.5 pg — ABNORMAL HIGH (ref 26.0–34.0)
MCHC: 32.7 g/dL (ref 30.0–36.0)
MCV: 114.6 fL — AB (ref 78.0–100.0)
MONOS PCT: 11 % (ref 3–12)
Monocytes Absolute: 0.8 10*3/uL (ref 0.1–1.0)
NEUTROS ABS: 4.7 10*3/uL (ref 1.7–7.7)
Neutrophils Relative %: 69 % (ref 43–77)
Platelets: 145 10*3/uL — ABNORMAL LOW (ref 150–400)
RBC: 3.28 MIL/uL — ABNORMAL LOW (ref 4.22–5.81)
RDW: 13.6 % (ref 11.5–15.5)
WBC: 6.9 10*3/uL (ref 4.0–10.5)

## 2013-07-15 LAB — MRSA PCR SCREENING: MRSA BY PCR: NEGATIVE

## 2013-07-15 LAB — BASIC METABOLIC PANEL
BUN: 12 mg/dL (ref 6–23)
CO2: 30 meq/L (ref 19–32)
CREATININE: 4.16 mg/dL — AB (ref 0.50–1.35)
Calcium: 9.6 mg/dL (ref 8.4–10.5)
Chloride: 90 mEq/L — ABNORMAL LOW (ref 96–112)
GFR calc non Af Amer: 13 mL/min — ABNORMAL LOW (ref >90)
GFR, EST AFRICAN AMERICAN: 15 mL/min — AB (ref >90)
Glucose, Bld: 105 mg/dL — ABNORMAL HIGH (ref 70–99)
Potassium: 3.7 mEq/L (ref 3.7–5.3)
Sodium: 137 mEq/L (ref 137–147)

## 2013-07-15 LAB — HIV ANTIBODY (ROUTINE TESTING W REFLEX): HIV: NONREACTIVE

## 2013-07-15 LAB — TROPONIN I

## 2013-07-15 LAB — D-DIMER, QUANTITATIVE (NOT AT ARMC): D-Dimer, Quant: 1.72 ug/mL-FEU — ABNORMAL HIGH (ref 0.00–0.48)

## 2013-07-15 MED ORDER — ALBUTEROL SULFATE (2.5 MG/3ML) 0.083% IN NEBU
5.0000 mg | INHALATION_SOLUTION | Freq: Once | RESPIRATORY_TRACT | Status: AC
  Administered 2013-07-15: 5 mg via RESPIRATORY_TRACT
  Filled 2013-07-15: qty 6

## 2013-07-15 MED ORDER — HYDROMORPHONE HCL PF 1 MG/ML IJ SOLN
1.0000 mg | INTRAMUSCULAR | Status: DC | PRN
  Administered 2013-07-15 – 2013-07-20 (×8): 1 mg via INTRAVENOUS
  Filled 2013-07-15 (×8): qty 1

## 2013-07-15 MED ORDER — VANCOMYCIN HCL IN DEXTROSE 1-5 GM/200ML-% IV SOLN
1000.0000 mg | INTRAVENOUS | Status: DC
  Administered 2013-07-18 – 2013-07-20 (×2): 1000 mg via INTRAVENOUS
  Filled 2013-07-15 (×3): qty 200

## 2013-07-15 MED ORDER — ENOXAPARIN SODIUM 40 MG/0.4ML ~~LOC~~ SOLN
40.0000 mg | SUBCUTANEOUS | Status: DC
Start: 2013-07-15 — End: 2013-07-15

## 2013-07-15 MED ORDER — ATORVASTATIN CALCIUM 10 MG PO TABS
10.0000 mg | ORAL_TABLET | Freq: Every day | ORAL | Status: DC
  Administered 2013-07-15 – 2013-07-21 (×7): 10 mg via ORAL
  Filled 2013-07-15 (×8): qty 1

## 2013-07-15 MED ORDER — CALCIUM CARBONATE-VITAMIN D 500-200 MG-UNIT PO TABS
1.0000 | ORAL_TABLET | Freq: Every day | ORAL | Status: DC
  Administered 2013-07-16 – 2013-07-21 (×6): 1 via ORAL
  Filled 2013-07-15 (×9): qty 1

## 2013-07-15 MED ORDER — AMIODARONE HCL 200 MG PO TABS
100.0000 mg | ORAL_TABLET | Freq: Every day | ORAL | Status: DC
  Administered 2013-07-15 – 2013-07-20 (×6): 100 mg via ORAL
  Filled 2013-07-15 (×2): qty 0.5
  Filled 2013-07-15 (×3): qty 1
  Filled 2013-07-15: qty 0.5
  Filled 2013-07-15: qty 1
  Filled 2013-07-15: qty 0.5
  Filled 2013-07-15 (×3): qty 1
  Filled 2013-07-15 (×3): qty 0.5
  Filled 2013-07-15: qty 1

## 2013-07-15 MED ORDER — SEVELAMER CARBONATE 800 MG PO TABS
2400.0000 mg | ORAL_TABLET | Freq: Three times a day (TID) | ORAL | Status: DC
  Administered 2013-07-15 – 2013-07-21 (×17): 2400 mg via ORAL
  Filled 2013-07-15 (×16): qty 3

## 2013-07-15 MED ORDER — ACETAMINOPHEN 325 MG PO TABS
650.0000 mg | ORAL_TABLET | Freq: Once | ORAL | Status: AC
  Administered 2013-07-15: 650 mg via ORAL
  Filled 2013-07-15: qty 2

## 2013-07-15 MED ORDER — DEXTROSE 5 % IV SOLN
1.0000 g | INTRAVENOUS | Status: DC
  Filled 2013-07-15 (×2): qty 10

## 2013-07-15 MED ORDER — DEXTROSE 5 % IV SOLN
2.0000 g | INTRAVENOUS | Status: DC
  Administered 2013-07-18 – 2013-07-20 (×2): 2 g via INTRAVENOUS
  Filled 2013-07-15 (×3): qty 2

## 2013-07-15 MED ORDER — RENA-VITE PO TABS
1.0000 | ORAL_TABLET | Freq: Every day | ORAL | Status: DC
  Administered 2013-07-15 – 2013-07-21 (×7): 1 via ORAL
  Filled 2013-07-15 (×13): qty 1

## 2013-07-15 MED ORDER — NITROGLYCERIN 0.4 MG SL SUBL: 0.4000 mg | SUBLINGUAL_TABLET | SUBLINGUAL | Status: DC | PRN

## 2013-07-15 MED ORDER — PAROXETINE HCL 20 MG PO TABS
20.0000 mg | ORAL_TABLET | Freq: Every day | ORAL | Status: DC
  Administered 2013-07-15 – 2013-07-21 (×7): 20 mg via ORAL
  Filled 2013-07-15 (×7): qty 1

## 2013-07-15 MED ORDER — ACETAMINOPHEN 325 MG PO TABS
650.0000 mg | ORAL_TABLET | ORAL | Status: DC | PRN
  Administered 2013-07-15: 650 mg via ORAL
  Filled 2013-07-15: qty 2

## 2013-07-15 MED ORDER — DEXTROSE 5 % IV SOLN
500.0000 mg | INTRAVENOUS | Status: DC
  Filled 2013-07-15 (×2): qty 500

## 2013-07-15 MED ORDER — PRASUGREL HCL 10 MG PO TABS
10.0000 mg | ORAL_TABLET | Freq: Every day | ORAL | Status: DC
  Administered 2013-07-15 – 2013-07-21 (×7): 10 mg via ORAL
  Filled 2013-07-15 (×10): qty 1

## 2013-07-15 MED ORDER — DEXTROSE 5 % IV SOLN
2.0000 g | Freq: Once | INTRAVENOUS | Status: AC
  Administered 2013-07-15: 2 g via INTRAVENOUS
  Filled 2013-07-15: qty 2

## 2013-07-15 MED ORDER — DEXTROSE 5 % IV SOLN
500.0000 mg | INTRAVENOUS | Status: DC
  Administered 2013-07-15: 500 mg via INTRAVENOUS
  Filled 2013-07-15 (×2): qty 500

## 2013-07-15 MED ORDER — DEXTROSE 5 % IV SOLN
1.0000 g | Freq: Once | INTRAVENOUS | Status: AC
  Administered 2013-07-15: 1 g via INTRAVENOUS
  Filled 2013-07-15: qty 10

## 2013-07-15 MED ORDER — PANTOPRAZOLE SODIUM 40 MG PO TBEC
40.0000 mg | DELAYED_RELEASE_TABLET | Freq: Every day | ORAL | Status: DC
  Administered 2013-07-16 – 2013-07-21 (×6): 40 mg via ORAL
  Filled 2013-07-15 (×6): qty 1

## 2013-07-15 MED ORDER — ALLOPURINOL 100 MG PO TABS
100.0000 mg | ORAL_TABLET | Freq: Two times a day (BID) | ORAL | Status: DC
  Administered 2013-07-15 – 2013-07-21 (×13): 100 mg via ORAL
  Filled 2013-07-15 (×13): qty 1

## 2013-07-15 MED ORDER — ONDANSETRON HCL 4 MG/2ML IJ SOLN
4.0000 mg | INTRAMUSCULAR | Status: DC | PRN
  Administered 2013-07-15: 4 mg via INTRAVENOUS
  Filled 2013-07-15: qty 2

## 2013-07-15 MED ORDER — VANCOMYCIN HCL 10 G IV SOLR
1500.0000 mg | Freq: Once | INTRAVENOUS | Status: AC
  Administered 2013-07-15: 1500 mg via INTRAVENOUS
  Filled 2013-07-15: qty 1500

## 2013-07-15 MED ORDER — ENOXAPARIN SODIUM 30 MG/0.3ML ~~LOC~~ SOLN: 30.0000 mg | SUBCUTANEOUS | Status: DC

## 2013-07-15 MED ORDER — METOPROLOL TARTRATE 25 MG PO TABS
12.5000 mg | ORAL_TABLET | Freq: Two times a day (BID) | ORAL | Status: DC
  Administered 2013-07-16 – 2013-07-21 (×11): 12.5 mg via ORAL
  Filled 2013-07-15 (×13): qty 1

## 2013-07-15 MED ORDER — CINACALCET HCL 30 MG PO TABS
30.0000 mg | ORAL_TABLET | ORAL | Status: DC
  Administered 2013-07-16 – 2013-07-20 (×3): 30 mg via ORAL
  Filled 2013-07-15 (×7): qty 1

## 2013-07-15 NOTE — ED Notes (Signed)
Pt c/o cough, fever since Wednesday, admits to sputum production, unsure of any color, states "I swallow it back down" pt returned from xray, Peyton NajjarLarry xray tech advised that pt became sob with exertion, pulse ox upper 80's on RA, once pt had time to rest, pulse ox increased to upper 90's RA but resp rate still elevated upper 20's, pt states that he gets sob "sometimes" appeared uncomfortable, oxygen applied at 3lpm via [place], Dr Rubin PayorPickering at bedside,

## 2013-07-15 NOTE — H&P (Addendum)
[facility] History and Physical  Roger [name]:[id number] DOB: 1940/10/17 DOA: 07/15/2013  Referring physician: ED physician PCP: [name], MD   Chief Complaint: shortness of breath   HPI:  Pt is 73 yo male with ESRD on HD TTS, presented to [facility] ED with main concern of 3 days duration of progressively worsening shortness of breath, associated with productive cough of yellow sputum, subjective fevers, chills, nausea, poor oral intake, chest pain that is worse with coughing spells. No specific alleviating factors, worse with walking and even minimal exertion. Pt denies known sick contacts or exposures, he has had HD earlier today and was feeling worse after HD. In ED, pt noted to be hypotensive with SBP in 80's, CXR worrisome for PNA, pt given Zithromax and Rocephin and TRH asked to admit for further evaluation. SDU bed requested.   Assessment and Plan: Active Problems: Acute hypoxic respiratory failure - Etiology unclear, possibly clinical pneumoniae even though not evident on chest x-ray - Questionable respiratory virus - Will admit to step down due to hypertension - Placed on broad-spectrum antibiotics as patient is hemodialysis patient - Followup on blood culture, respiratory viral panel - Provide IV fluids, gentle hydration as patient is on hemodialysis CAP - cover broadly as pt is on HD - Management as noted above ESRD - notify nephrology team  Hypotension - hold Metoprolol, Indur Atrial fibrillation  - continue amiodarone and effient  Anemia of Chronic Disease - stable and at pt's baseline  - CBC in AM HLD - continue statin   Radiological Exams on Admission: Dg Chest 2 View   07/15/2013  Stable chest.  No acute cardiopulmonary process.    Code Status: Full Family Communication: Pt at bedside Disposition Plan: Admit for further evaluation     Review of Systems:  Constitutional: Negative for diaphoresis.  HENT: Negative for hearing loss, ear pain,  nosebleeds, congestion, sore throat, neck pain, tinnitus and ear discharge.   Eyes: Negative for blurred vision, double vision, photophobia, pain, discharge and redness.  Respiratory: Negative for wheezing and stridor.   Cardiovascular: Negative for chest pain, palpitations, orthopnea, claudication and leg swelling.  Gastrointestinal: Negative for nausea, vomiting and abdominal pain. Negative for heartburn, constipation, blood in stool and melena.  Genitourinary: Negative for dysuria, urgency, frequency, hematuria and flank pain.  Musculoskeletal: Negative for myalgias, back pain, joint pain and falls.  Skin: Negative for itching and rash.  Neurological: Negative for tingling, tremors, sensory change, speech change, focal weakness, loss of consciousness and headaches.  Endo/Heme/Allergies: Negative for environmental allergies and polydipsia. Does not bruise/bleed easily.  Psychiatric/Behavioral: Negative for suicidal ideas. The patient is not nervous/anxious.      Past Medical History  Diagnosis Date  . HTN (hypertension)   . Arthritis   . Hypercholesteremia   . MI (myocardial infarction) 03/16/2011    inferolateral ST segment elevation MI  . GERD (gastroesophageal reflux disease)   . Atrial fibrillation     Paroxysmal - 2 episodes related to ischemic NSTEMI events  5 & 09/2011  . Gout   . S/P coronary artery stent placement, 10/01/11 with DES to mid LAD and  PTCA of Diag 2 10/01/2009    Inferior STEMI 2011 - BMS to LCx; NSTEMI 5/13 with ISR/thrombosis of Cx BMS -->multiple DES to  LCx ; staged PCI of LAD /Balloon PTCA of major Diag 09/2011 for Sx (NSTEMI)   . Anticoagulated on warfarin 10/06/2011  . Heart murmur   . Anemia   . Schatzki's ring   .  Angiodysplasia of intestine   . Aortic stenosis, moderate     2D Echocardiogram 12/13 and 08/22/11 showed moderate aortic stenosis with valve area of 1.28 sq cm. EF of 45 to 50% with severe hypokinesia of the entire inferolateral myocardium  .  ESRD on hemodialysis     [place] [facility], TTS, esrd due to HTN  . Right renal artery stenosis 1998  . Ulcer     Past Surgical History  Procedure Laterality Date  . Ureteral stent placement    . Colonoscopy  04/2009    with EGD by Dr.Buccini moderately large hiatal hernia could contribute to his anemia,Schatzki ring which might accout for his intermittent dysphagia/+internal hemorrhoids ;family hx of colon ca  . Esophagogastroduodenoscopy  03/2010    by Dr.Magod/small hiatal hernia with widley patent fibrous ring,minimal bulbitis otherwise nl  . Dialysis fistula creation    . Coronary angioplasty with stent placement  08/2011 & 09/2011    Integrity Resolute DES Stents  Stent #1:  2.70mm x 14 mm (extending to just distal to the bifurcation to the mid OM, Stent #2:  2.68mm x 18mm  Stent #3:  3.26mm x 18mm; covering proximal portion of the old stent into the proximal circumflex and overlapping stent #2 distally, Stent #4: 2.73mm x 8mm (4 overlapping in LCx- OM1; 1 in mid LAD; PTCA only of Major Diag (small caliber)  . Coronary angioplasty with stent placement  2011, 2012    Dr [name]  03/16/11  re-cannulized totally occluded circumflex, stented with Integrity bare metal stent focal 90% stenosis in LAD and diagonal branch   . Givens capsule study  04/16/10    SB erosions vs. AVMs, non-specific distal SB inflammation in setting ASA, plavix, NSAIDs  . Esophagogastroduodenoscopy  03/14/2012    Procedure: ESOPHAGOGASTRODUODENOSCOPY (EGD);  Surgeon: Iva Boop, MD;  Location: [facility] ENDOSCOPY;  Service: Endoscopy;  Laterality: N/A;  . Colonoscopy  03/16/2012    Procedure: COLONOSCOPY;  Surgeon: Iva Boop, MD;  Location: [facility] ENDOSCOPY;  Service: Endoscopy;  Laterality: N/A;  . Right renal artery, pta and stenting  08/22/1996    Dr [name], P-154 Palmaz stent mounted on a 6mm x 2cm PowerFlex and deployed in the renal ostium at 7 atmospheres  reduction of 95% lesion to 0% residual without  dissection    . Enteroscopy N/A 07/06/2012    Procedure: ENTEROSCOPY;  Surgeon: Hilarie Fredrickson, MD;  Location: [facility] ENDOSCOPY;  Service: Endoscopy;  Laterality: N/A;  . Doppler echocardiography  03/15/2012-[facility]    LV 45-50%  . Nm myocar perf wall motion  05/02/2010    EF49% ; mod perfusion basal inferolateral, basal anterolateral,mid inferolat and mid anterolateral region  . Carotid doppler  04/20/2011    lright common iliac artery0-49%,left common iliac artery 0-49%  . Renal angigram  01/02/2004    C02 angiography-wideely patent renal arteries  . Event monitor  09/02/2011-6/20/2/13    reason afib    Social History:  reports that he quit smoking about 21 years ago. His smoking use included Cigarettes. He has a 22.5 pack-year smoking history. His smokeless tobacco use includes Chew. He reports that he does not drink alcohol or use illicit drugs.  No Known Allergies  Family History  Problem Relation Age of Onset  . Coronary artery disease Father   . Hypertension Mother   . Coronary artery disease Mother   . Colon cancer Brother 99    Prior to Admission medications   Medication Sig Start Date End  Date Taking? Authorizing Provider  allopurinol (ZYLOPRIM) 100 MG tablet Take 100 mg by mouth 2 (two) times daily.    Yes Historical Provider, MD  amiodarone (PACERONE) 200 MG tablet Take 100 mg by mouth at bedtime.   Yes Historical Provider, MD  calcium-vitamin D (OSCAL WITH D) 500-200 MG-UNIT per tablet Take 1 tablet by mouth daily with breakfast.   Yes Historical Provider, MD  cinacalcet (SENSIPAR) 30 MG tablet Take 30 mg by mouth every other day.    Yes Historical Provider, MD  isosorbide mononitrate (IMDUR) 30 MG 24 hr tablet Take 1 tablet (30 mg total) by mouth at bedtime. 06/28/13  Yes Runell GessJonathan J Berry, MD  metoprolol tartrate (LOPRESSOR) 25 MG tablet Take 0.5 tablets (12.5 mg total) by mouth 2 (two) times daily. 06/14/13  Yes Runell GessJonathan J Berry, MD  multivitamin (RENA-VIT) TABS tablet Take 1  tablet by mouth every morning.    Yes Historical Provider, MD  nitroGLYCERIN (NITROSTAT) 0.4 MG SL tablet Place 1 tablet (0.4 mg total) under the tongue every 5 (five) minutes as needed. 08/26/11  Yes Wilburt FinlayBryan Hager, PA-C  pantoprazole (PROTONIX) 40 MG tablet Take 1 tablet (40 mg total) by mouth daily before breakfast. 07/26/12  Yes Iva Booparl E Gessner, MD  PARoxetine (PAXIL) 20 MG tablet Take 20 mg by mouth daily.   Yes Historical Provider, MD  prasugrel (EFFIENT) 10 MG TABS tablet Take 1 tablet (10 mg total) by mouth daily. 02/20/13  Yes Runell GessJonathan J Berry, MD  rosuvastatin (CRESTOR) 10 MG tablet Take 10 mg by mouth at bedtime.    Yes Historical Provider, MD  sevelamer (RENVELA) 800 MG tablet Take 2,400 mg by mouth 3 (three) times daily with meals.   Yes Historical Provider, MD    Physical Exam: Filed Vitals:   07/15/13 1016 07/15/13 1107  BP: 104/53   Pulse: 82   Temp: 98.8 F (37.1 C)   TempSrc: Oral   Resp: 26   Height: 5\' 9"  (1.753 m)   Weight: 85.73 kg (189 lb)   SpO2: 98% 99%    Physical Exam  Constitutional: Appears well-developed and well-nourished. No distress.  HENT: Normocephalic. External right and left ear normal. Oropharynx is clear and moist.  Eyes: Conjunctivae and EOM are normal. PERRLA, no scleral icterus.  Neck: Normal ROM. Neck supple. No JVD. No tracheal deviation. No thyromegaly.  CVS: RRR, S1/S2 +, no murmurs, no gallops, no carotid bruit.  Pulmonary: Effort and breath sounds normal, no stridor, rhonchi, wheezes, rales.  Abdominal: Soft. BS +,  no distension, tenderness, rebound or guarding.  Musculoskeletal: Normal range of motion. No edema and no tenderness.  Lymphadenopathy: No lymphadenopathy noted, cervical, inguinal. Neuro: Alert. Normal reflexes, muscle tone coordination. No cranial nerve deficit. Skin: Skin is warm and dry. No rash noted. Not diaphoretic. No erythema. No pallor.  Psychiatric: Normal mood and affect. Behavior, judgment, thought content normal.    Labs on Admission:  Basic Metabolic Panel:  Recent Labs Lab 07/15/13 1022  NA 137  K 3.7  CL 90*  CO2 30  GLUCOSE 105*  BUN 12  CREATININE 4.16*  CALCIUM 9.6   CBC:  Recent Labs Lab 07/15/13 1022  WBC 6.9  NEUTROABS 4.7  HGB 12.3*  HCT 37.6*  MCV 114.6*  PLT 145*   EKG: Normal sinus rhythm, no ST/T wave changes  Debbora PrestoMAGICK-Kaetlyn Noa, MD  [facility] Pager [phone]  If 7PM-7AM, please contact night-coverage [email or web address] Password [email or web address] 07/15/2013, 11:52 AM

## 2013-07-15 NOTE — ED Notes (Signed)
Pt c/o productive cough, congestion, fever, and chest pain since Thursday.  Reports had dialysis today.

## 2013-07-15 NOTE — Progress Notes (Signed)
Placed order for CT chest without contrast.   Debbora PrestoMAGICK-Tylan Kinn, MD  [facility] Cell [phone]  If 7PM-7AM, please contact night-coverage [email or web address] Password [email or web address]

## 2013-07-15 NOTE — ED Provider Notes (Signed)
CSN: [id number]     Arrival date & time 07/15/13  1007 History  This chart was scribed for [employer]. Rubin Payor, MD,  by Ashley Jacobs, ED Scribe. The patient was seen in room [facility] and the patient's care was started at 10:43 AM.    First MD Initiated Contact with Patient 07/15/13 1028     Chief Complaint  Patient presents with  . Shortness of Breath     (Consider location/radiation/quality/duration/timing/severity/associated sxs/prior Treatment) Patient is a 73 y.o. male presenting with shortness of breath. The history is provided by the patient, medical records and the spouse. No language interpreter was used.  Shortness of Breath Severity:  Severe Onset quality:  Gradual Duration:  3 days Timing:  Constant Progression:  Worsening Chronicity:  New Context: activity   Relieved by:  Nothing Worsened by:  Exertion, movement and activity Associated symptoms: chest pain, cough, fever and vomiting    HPI Comments: Roger [name] is a 73 y.o. male who presents to the Emergency Department complaining of constant, gradually worsening, severe SOB, onset three days PTA. Pt has the associated symptoms of fever, productive cough, vomiting, nausea, and congestion. He also has constant, moderate chest pain that is worse with coughing. Nothing seems to resolve his symptoms. The SOB is worse with walking and exertion. He states his unable to walk across the room without SOB.   Pt had dialysis this morning. He was not admitted to the hospital recently. His PCP is Dr. Ardelle Balls.  Past Medical History  Diagnosis Date  . HTN (hypertension)   . Arthritis   . Hypercholesteremia   . MI (myocardial infarction) 03/16/2011    inferolateral ST segment elevation MI  . GERD (gastroesophageal reflux disease)   . Atrial fibrillation     Paroxysmal - 2 episodes related to ischemic NSTEMI events  5 & 09/2011  . Gout   . S/P coronary artery stent placement, 10/01/11 with DES to mid LAD and  PTCA of Diag 2  10/01/2009    Inferior STEMI 2011 - BMS to LCx; NSTEMI 5/13 with ISR/thrombosis of Cx BMS -->multiple DES to  LCx ; staged PCI of LAD /Balloon PTCA of major Diag 09/2011 for Sx (NSTEMI)   . Anticoagulated on warfarin 10/06/2011  . Heart murmur   . Anemia   . Schatzki's ring   . Angiodysplasia of intestine   . Aortic stenosis, moderate     2D Echocardiogram 12/13 and 08/22/11 showed moderate aortic stenosis with valve area of 1.28 sq cm. EF of 45 to 50% with severe hypokinesia of the entire inferolateral myocardium  . ESRD on hemodialysis     [place] [facility], TTS, esrd due to HTN  . Right renal artery stenosis 1998  . Ulcer    Past Surgical History  Procedure Laterality Date  . Ureteral stent placement    . Colonoscopy  04/2009    with EGD by Dr.Buccini moderately large hiatal hernia could contribute to his anemia,Schatzki ring which might accout for his intermittent dysphagia/+internal hemorrhoids ;family hx of colon ca  . Esophagogastroduodenoscopy  03/2010    by Dr.Magod/small hiatal hernia with widley patent fibrous ring,minimal bulbitis otherwise nl  . Dialysis fistula creation    . Coronary angioplasty with stent placement  08/2011 & 09/2011    Integrity Resolute DES Stents  Stent #1:  2.65mm x 14 mm (extending to just distal to the bifurcation to the mid OM, Stent #2:  2.8mm x 18mm  Stent #3:  3.35mm x 18mm; covering  proximal portion of the old stent into the proximal circumflex and overlapping stent #2 distally, Stent #4: 2.1mm x 8mm (4 overlapping in LCx- OM1; 1 in mid LAD; PTCA only of Major Diag (small caliber)  . Coronary angioplasty with stent placement  2011, 2012    Dr [name]  03/16/11  re-cannulized totally occluded circumflex, stented with Integrity bare metal stent focal 90% stenosis in LAD and diagonal branch   . Givens capsule study  04/16/10    SB erosions vs. AVMs, non-specific distal SB inflammation in setting ASA, plavix, NSAIDs  . Esophagogastroduodenoscopy  03/14/2012     Procedure: ESOPHAGOGASTRODUODENOSCOPY (EGD);  Surgeon: Iva Boop, MD;  Location: [facility] ENDOSCOPY;  Service: Endoscopy;  Laterality: N/A;  . Colonoscopy  03/16/2012    Procedure: COLONOSCOPY;  Surgeon: Iva Boop, MD;  Location: [facility] ENDOSCOPY;  Service: Endoscopy;  Laterality: N/A;  . Right renal artery, pta and stenting  08/22/1996    Dr [name], P-154 Palmaz stent mounted on a 6mm x 2cm PowerFlex and deployed in the renal ostium at 7 atmospheres  reduction of 95% lesion to 0% residual without dissection    . Enteroscopy N/A 07/06/2012    Procedure: ENTEROSCOPY;  Surgeon: Hilarie Fredrickson, MD;  Location: [facility] ENDOSCOPY;  Service: Endoscopy;  Laterality: N/A;  . Doppler echocardiography  03/15/2012-[facility]    LV 45-50%  . Nm myocar perf wall motion  05/02/2010    EF49% ; mod perfusion basal inferolateral, basal anterolateral,mid inferolat and mid anterolateral region  . Carotid doppler  04/20/2011    lright common iliac artery0-49%,left common iliac artery 0-49%  . Renal angigram  01/02/2004    C02 angiography-wideely patent renal arteries  . Event monitor  09/02/2011-6/20/2/13    reason afib   Family History  Problem Relation Age of Onset  . Coronary artery disease Father   . Hypertension Mother   . Coronary artery disease Mother   . Colon cancer Brother 68   History  Substance Use Topics  . Smoking status: Former Smoker -- 0.50 packs/day for 45 years    Types: Cigarettes    Quit date: 04/13/1992  . Smokeless tobacco: Current User    Types: Chew  . Alcohol Use: No    Review of Systems  Constitutional: Positive for fever.  HENT: Positive for congestion.   Respiratory: Positive for cough and shortness of breath.   Cardiovascular: Positive for chest pain.  Gastrointestinal: Positive for nausea and vomiting.  All other systems reviewed and are negative.      Allergies  Review of patient's allergies indicates no known allergies.  Home Medications   Current  Outpatient Rx  Name  Route  Sig  Dispense  Refill  . allopurinol (ZYLOPRIM) 100 MG tablet   Oral   Take 100 mg by mouth 2 (two) times daily.          [name] amiodarone (PACERONE) 200 MG tablet   Oral   Take 100 mg by mouth at bedtime.         . calcium-vitamin D (OSCAL WITH D) 500-200 MG-UNIT per tablet   Oral   Take 1 tablet by mouth daily with breakfast.         . cinacalcet (SENSIPAR) 30 MG tablet   Oral   Take 30 mg by mouth every other day.          . isosorbide mononitrate (IMDUR) 30 MG 24 hr tablet   Oral   Take 1 tablet (30 mg  total) by mouth at bedtime.   90 tablet   3   . metoprolol tartrate (LOPRESSOR) 25 MG tablet   Oral   Take 0.5 tablets (12.5 mg total) by mouth 2 (two) times daily.   90 tablet   3   . multivitamin (RENA-VIT) TABS tablet   Oral   Take 1 tablet by mouth every morning.          . nitroGLYCERIN (NITROSTAT) 0.4 MG SL tablet   Sublingual   Place 1 tablet (0.4 mg total) under the tongue every 5 (five) minutes as needed.   25 tablet   5   . pantoprazole (PROTONIX) 40 MG tablet   Oral   Take 1 tablet (40 mg total) by mouth daily before breakfast.         . PARoxetine (PAXIL) 20 MG tablet   Oral   Take 20 mg by mouth daily.         . prasugrel (EFFIENT) 10 MG TABS tablet   Oral   Take 1 tablet (10 mg total) by mouth daily.   90 tablet   3   . rosuvastatin (CRESTOR) 10 MG tablet   Oral   Take 10 mg by mouth at bedtime.          . sevelamer (RENVELA) 800 MG tablet   Oral   Take 2,400 mg by mouth 3 (three) times daily with meals.          BP 94/50  Pulse 90  Temp(Src) 102.9 F (39.4 C) (Oral)  Resp 26  Ht 5\' 9"  (1.753 m)  Wt 189 lb (85.73 kg)  BMI 27.90 kg/m2  SpO2 99% Physical Exam  Nursing note and vitals reviewed. Constitutional: He is oriented to person, place, and time. He appears well-developed and well-nourished.  Non-toxic appearance. He does not appear ill. No distress.  HENT:  Head: Atraumatic.   Nose: No mucosal edema or rhinorrhea.  Mouth/Throat: Mucous membranes are normal. No dental abscesses or uvula swelling.  Eyes: Pupils are equal, round, and reactive to light.  Neck: Normal range of motion and full passive range of motion without pain. Neck supple.  Cardiovascular: Normal rate, regular rhythm and normal heart sounds.  Exam reveals no gallop and no friction rub.   No murmur heard. Pulmonary/Chest: He has no rhonchi. He exhibits no crepitus.  Mild diffuse wheezes. No respiratory distress. Localizing rales to right chest.  Abdominal: Soft. Normal appearance. He exhibits no distension. There is no tenderness.  Musculoskeletal: Normal range of motion. He exhibits no edema and no tenderness.  Neurological: He is alert and oriented to person, place, and time. He has normal strength.  Skin: Skin is warm and intact.  Psychiatric: His speech is normal. His mood appears not anxious.    ED Course  Procedures (including critical care time) DIAGNOSTIC STUDIES: Oxygen Saturation is 98% on room air, normal by my interpretation.    COORDINATION OF CARE:  10:47 AM Discussed course of care with pt which includes chest x-ray, EKG and laboratory tests. Pt understands and agrees.    Labs Review Labs Reviewed  CBC WITH DIFFERENTIAL - Abnormal; Notable for the following:    RBC 3.28 (*)    Hemoglobin 12.3 (*)    HCT 37.6 (*)    MCV 114.6 (*)    MCH 37.5 (*)    Platelets 145 (*)    All other components within normal limits  BASIC METABOLIC PANEL - Abnormal; Notable for the following:  Chloride 90 (*)    Glucose, Bld 105 (*)    Creatinine, Ser 4.16 (*)    GFR calc non Af Amer 13 (*)    GFR calc Af Amer 15 (*)    All other components within normal limits   Imaging Review Dg Chest 2 View  07/15/2013   CLINICAL DATA:  Shortness of breath.  EXAM: CHEST  2 VIEW  COMPARISON:  DG CHEST 1V PORT dated 03/13/2012; DG CHEST 1V PORT dated 10/14/2011; DG CHEST 2 VIEW dated 10/12/2011  FINDINGS:  The heart size and mediastinal contours are stable with aortic atherosclerosis. The lungs are clear. There is no pleural effusion or pneumothorax. Old fracture of the right seventh rib appears stable. No acute osseous findings are evident. Telemetry leads overlie the chest.  IMPRESSION: Stable chest.  No acute cardiopulmonary process.   Electronically Signed   By: Roxy HorsemanBill  Veazey M.D.   On: 07/15/2013 10:57     EKG Interpretation None      MDM   Final diagnoses:  CAP (community acquired pneumonia)    Patient with clinical pneumonia with lateralizing findings on physical exam. Initially well-appearing, however became somewhat hypotensive when he developed a fever. He was still overall well-appearing. Will give fluid boluses. Have rediscussed the hospital was initially admitted to step down unit.  I personally performed the services described in this documentation, which was scribed in my presence. The recorded information has been reviewed and is accurate.    Juliet RudeNathan R. Rubin PayorPickering, MD 07/15/13 1253

## 2013-07-15 NOTE — ED Notes (Signed)
resp therapy paged for breathing tx,  

## 2013-07-15 NOTE — Progress Notes (Signed)
ANTIBIOTIC CONSULT NOTE - INITIAL  Pharmacy Consult for Vancomycin & Elita QuickFortaz Indication: pneumonia  No Known Allergies  Patient Measurements: Height: 5\' 9"  (175.3 cm) Weight: 189 lb (85.73 kg) IBW/kg (Calculated) : 70.7  Vital Signs: Temp: 99.7 F (37.6 C) (04/04 1328) Temp src: Oral (04/04 1328) BP: 87/41 mmHg (04/04 1430) Pulse Rate: 70 (04/04 1430) Intake/Output from previous day:   Intake/Output from this shift:    Labs:  Recent Labs  07/15/13 1022  WBC 6.9  HGB 12.3*  PLT 145*  CREATININE 4.16*   Estimated Creatinine Clearance: 17.7 ml/min (by C-G formula based on Cr of 4.16). No results found for this basename: VANCOTROUGH, VANCOPEAK, VANCORANDOM, GENTTROUGH, GENTPEAK, GENTRANDOM, TOBRATROUGH, TOBRAPEAK, TOBRARND, AMIKACINPEAK, AMIKACINTROU, AMIKACIN,  in the last 72 hours   Microbiology: No results found for this or any previous visit (from the past 720 hour(s)).  Medical History: Past Medical History  Diagnosis Date  . HTN (hypertension)   . Arthritis   . Hypercholesteremia   . MI (myocardial infarction) 03/16/2011    inferolateral ST segment elevation MI  . GERD (gastroesophageal reflux disease)   . Atrial fibrillation     Paroxysmal - 2 episodes related to ischemic NSTEMI events  5 & 09/2011  . Gout   . S/P coronary artery stent placement, 10/01/11 with DES to mid LAD and  PTCA of Diag 2 10/01/2009    Inferior STEMI 2011 - BMS to LCx; NSTEMI 5/13 with ISR/thrombosis of Cx BMS -->multiple DES to  LCx ; staged PCI of LAD /Balloon PTCA of major Diag 09/2011 for Sx (NSTEMI)   . Anticoagulated on warfarin 10/06/2011  . Heart murmur   . Anemia   . Schatzki's ring   . Angiodysplasia of intestine   . Aortic stenosis, moderate     2D Echocardiogram 12/13 and 08/22/11 showed moderate aortic stenosis with valve area of 1.28 sq cm. EF of 45 to 50% with severe hypokinesia of the entire inferolateral myocardium  . ESRD on hemodialysis     [place] [facility], TTS, esrd due  to HTN  . Right renal artery stenosis 1998  . Ulcer     Medications:  Scheduled:  . allopurinol  100 mg Oral BID  . amiodarone  100 mg Oral QHS  . atorvastatin  10 mg Oral q1800  . [START ON 07/16/2013] calcium-vitamin D  1 tablet Oral Q breakfast  . [START ON 07/16/2013] cinacalcet  30 mg Oral QODAY  . enoxaparin (LOVENOX) injection  30 mg Subcutaneous Q24H  . metoprolol tartrate  12.5 mg Oral BID  . multivitamin  1 tablet Oral Daily  . [START ON 07/16/2013] pantoprazole  40 mg Oral QAC breakfast  . PARoxetine  20 mg Oral Daily  . prasugrel  10 mg Oral Daily  . sevelamer carbonate  2,400 mg Oral TID WC  . vancomycin  1,500 mg Intravenous Once   Assessment: 73 yo M with ESRD requiring HD presents with shortness of breath, clinical presentation of PNA.  He spiked a temp in ED and is hypotensive.   He was started on Rocephin & Zithromax in ED, however discussed with MD and plan to broaden coverage since patient meets criteria for HCAP (dialysis ctr) and possible sepsis.  He had dialysis as an outpatient today  Vancomycin 4/4>> Elita QuickFortaz 4/4>> Rocephin 4/4>>4/4 Zithromax 4/4>>4/4  Goal of Therapy:  Pre-HD level of 15-25 mcg/ml  Plan:  Vancomycin 1500mg  IV loading dose then 1gm IV qHD Fortaz 2gm IV qHD Check pre-HD Vancomycin level at  steady state Monitor labs and cx data   Elson Clan 07/15/2013,2:33 PM

## 2013-07-16 ENCOUNTER — Inpatient Hospital Stay (HOSPITAL_COMMUNITY): Payer: Medicare Other

## 2013-07-16 LAB — COMPREHENSIVE METABOLIC PANEL
ALT: 13 U/L (ref 0–53)
AST: 21 U/L (ref 0–37)
Albumin: 2.8 g/dL — ABNORMAL LOW (ref 3.5–5.2)
Alkaline Phosphatase: 90 U/L (ref 39–117)
BUN: 28 mg/dL — ABNORMAL HIGH (ref 6–23)
CALCIUM: 8.6 mg/dL (ref 8.4–10.5)
CO2: 29 meq/L (ref 19–32)
Chloride: 96 mEq/L (ref 96–112)
Creatinine, Ser: 6.96 mg/dL — ABNORMAL HIGH (ref 0.50–1.35)
GFR calc Af Amer: 8 mL/min — ABNORMAL LOW (ref >90)
GFR calc non Af Amer: 7 mL/min — ABNORMAL LOW (ref >90)
Glucose, Bld: 101 mg/dL — ABNORMAL HIGH (ref 70–99)
Potassium: 4.2 mEq/L (ref 3.7–5.3)
SODIUM: 140 meq/L (ref 137–147)
Total Bilirubin: 0.5 mg/dL (ref 0.3–1.2)
Total Protein: 7.6 g/dL (ref 6.0–8.3)

## 2013-07-16 MED ORDER — ALBUTEROL SULFATE (2.5 MG/3ML) 0.083% IN NEBU
2.5000 mg | INHALATION_SOLUTION | RESPIRATORY_TRACT | Status: DC | PRN
  Administered 2013-07-21: 2.5 mg via RESPIRATORY_TRACT
  Filled 2013-07-16: qty 3

## 2013-07-16 MED ORDER — ALBUTEROL SULFATE (2.5 MG/3ML) 0.083% IN NEBU
5.0000 mg | INHALATION_SOLUTION | Freq: Four times a day (QID) | RESPIRATORY_TRACT | Status: DC
  Administered 2013-07-16 – 2013-07-17 (×4): 5 mg via RESPIRATORY_TRACT
  Filled 2013-07-16 (×4): qty 6

## 2013-07-16 NOTE — Progress Notes (Signed)
NAME:  Roger [name], Roger [name]                  ACCOUNT NO.:  [id number]  MEDICAL RECORD NO.:  [id number]  LOCATION:  IC01                          FACILITY:  [facility]  PHYSICIAN:  Kingsley Callanderoy O. Ouida SillsFagan, MD       DATE OF BIRTH:  11/06/1940  DATE OF PROCEDURE:  07/16/2013 DATE OF DISCHARGE:                                PROGRESS NOTE   [name] was admitted yesterday after presenting with shortness of breath, cough, and congestion.  His chest x-ray revealed no definite infiltrate.  He was treated with ceftriaxone initially and has been modified to [place] and vancomycin.  He has end-stage renal disease and was dialyzed fully yesterday.  He is not experiencing symptoms of volume overload.  He had a fever of 102.9.  His white count was 6.9.  He was found to have an elevated D-dimer of 1.72.  A CT scan of the chest was ordered and is being obtained this morning.  He is feeling better this morning.  He has a remote smoking history, but denies being treated chronically for COPD.  OBJECTIVE:  VITAL SIGNS:  Temperature 98.8, pulse 71, respirations 23, blood pressure 113/64, oxygen saturation 99% on 2 L. LUNGS:  Reveal bilateral rhonchi. HEART:  Regular with a grade 2 systolic murmur. ABDOMEN:  Soft and nontender with no palpable organomegaly. EXTREMITIES:  Reveal no edema. NEUROLOGIC:  He is alert, oriented, and comfortable appearing.  IMPRESSION AND PLAN: 1. Possible pneumonia.  Continue Fortaz and vancomycin. 2. Elevated D-dimer.  CT of the chest pending. 3. End-stage renal disease.  BUN and creatinine are 28 and 6.96 after     dialysis yesterday.  Potassium is 4.2 with a bicarb of 29. 4. Coronary artery disease, status post myocardial infarction and     stent. 5. History of atrial fibrillation.  He is currently in sinus rhythm. 6. Aortic stenosis. 7. Blood cultures are pending.  An HIV was nonreactive.  MRSA by PCR     was negative. 8. Macrocytic anemia.  Hemoglobin is 12.3 with an MCV of 114. 9.  Hypoalbuminemia, 2.8.    Kingsley Callanderoy O. Ouida SillsFagan, MD    ROF/MEDQ  D:  07/16/2013  T:  07/16/2013  Job:  [id number]

## 2013-07-17 LAB — EXPECTORATED SPUTUM ASSESSMENT W GRAM STAIN, RFLX TO RESP C

## 2013-07-17 MED ORDER — ACETYLCYSTEINE 20 % IN SOLN
3.0000 mL | Freq: Three times a day (TID) | RESPIRATORY_TRACT | Status: DC
  Administered 2013-07-17 (×2): via RESPIRATORY_TRACT
  Administered 2013-07-18 – 2013-07-20 (×8): 3 mL via RESPIRATORY_TRACT
  Filled 2013-07-17 (×10): qty 4

## 2013-07-17 MED ORDER — ALBUTEROL SULFATE (2.5 MG/3ML) 0.083% IN NEBU
2.5000 mg | INHALATION_SOLUTION | Freq: Four times a day (QID) | RESPIRATORY_TRACT | Status: DC
  Administered 2013-07-17 – 2013-07-21 (×14): 2.5 mg via RESPIRATORY_TRACT
  Filled 2013-07-17 (×15): qty 3

## 2013-07-17 NOTE — Consult Note (Signed)
Reason for Consult: End-stage renal disease Referring Physician: Dr. Lenon Curt is an 73 y.o. male.  HPI: He he he patient he with history of coronary artery disease, moderate aortic stenosis, end-stage renal disease on maintenance hemodialysis presently came with complaints of a cough with yellowish sputum and fever of 3 to 4 days duration. According to patient he was having problems with difficulty breathing on Saturday before he went to the dialysis unit. He was dialyzed but he continued to have difficulty breathing hence decided come to the hospital. When he was evaluated patient was found to have possible pneumonia he is admitted to the hospital. Presently patient is still that he is still some cough but feel better as he has difficulty in breathing is concerned. Presently he denies any fevers chills or sweating.   Past Medical History  Diagnosis Date  . HTN (hypertension)   . Arthritis   . Hypercholesteremia   . MI (myocardial infarction) 03/16/2011    inferolateral ST segment elevation MI  . GERD (gastroesophageal reflux disease)   . Atrial fibrillation     Paroxysmal - 2 episodes related to ischemic NSTEMI events  5 & 09/2011  . Gout   . S/P coronary artery stent placement, 10/01/11 with DES to mid LAD and  PTCA of Diag 2 10/01/2009    Inferior STEMI 2011 - BMS to LCx; NSTEMI 5/13 with ISR/thrombosis of Cx BMS -->multiple DES to  LCx ; staged PCI of LAD /Balloon PTCA of major Diag 09/2011 for Sx (NSTEMI)   . Anticoagulated on warfarin 10/06/2011  . Heart murmur   . Anemia   . Schatzki's ring   . Angiodysplasia of intestine   . Aortic stenosis, moderate     2D Echocardiogram 12/13 and 08/22/11 showed moderate aortic stenosis with valve area of 1.28 sq cm. EF of 45 to 50% with severe hypokinesia of the entire inferolateral myocardium  . ESRD on hemodialysis     [facility], TTS, esrd due to HTN  . Right renal artery stenosis 1998  . Ulcer     Past Surgical History   Procedure Laterality Date  . Ureteral stent placement    . Colonoscopy  04/2009    with EGD by Dr.Buccini moderately large hiatal hernia could contribute to his anemia,Schatzki ring which might accout for his intermittent dysphagia/+internal hemorrhoids ;family hx of colon ca  . Esophagogastroduodenoscopy  03/2010    by Dr.Magod/small hiatal hernia with widley patent fibrous ring,minimal bulbitis otherwise nl  . Dialysis fistula creation    . Coronary angioplasty with stent placement  08/2011 & 09/2011    Integrity Resolute DES Stents  Stent #1:  2.56m x 14 mm (extending to just distal to the bifurcation to the mid OM, Stent #2:  2.736mx 1876mStent #3:  3.0mm47m18mm6mvering proximal portion of the old stent into the proximal circumflex and overlapping stent #2 distally, Stent #4: 2.25mm 22mm (453merlapping in LCx- OM1; 1 in mid LAD; PTCA only of Major Diag (small caliber)  . Coronary angioplasty with stent placement  2011, 2012    Dr JonathaQuay Burow12  re-cannulized totally occluded circumflex, stented with Integrity bare metal stent focal 90% stenosis in LAD and diagonal branch   . Givens capsule study  04/16/10    SB erosions vs. AVMs, non-specific distal SB inflammation in setting ASA, plavix, NSAIDs  . Esophagogastroduodenoscopy  03/14/2012    Procedure: ESOPHAGOGASTRODUODENOSCOPY (EGD);  Surgeon: Carl E Gatha Mayer  Location: [facility];  Service: Endoscopy;  Laterality: N/A;  . Colonoscopy  03/16/2012    Procedure: COLONOSCOPY;  Surgeon: Gatha Mayer, MD;  Location: [place];  Service: Endoscopy;  Laterality: N/A;  . Right renal artery, pta and stenting  08/22/1996    Dr Quay Burow, P-154 Palmaz stent mounted on a 71m x 2cm PowerFlex and deployed in the renal ostium at 7 atmospheres  reduction of 95% lesion to 0% residual without dissection    . Enteroscopy N/A 07/06/2012    Procedure: ENTEROSCOPY;  Surgeon: JIrene Shipper MD;  Location: [facility]  Service: Endoscopy;   Laterality: N/A;  . Doppler echocardiography  03/15/2012-[facility]    LV 45-50%  . Nm myocar perf wall motion  05/02/2010    EF49% ; mod perfusion basal inferolateral, basal anterolateral,mid inferolat and mid anterolateral region  . Carotid doppler  04/20/2011    lright common iliac artery0-49%,left common iliac artery 0-49%  . Renal angigram  01/02/2004    C02 angiography-wideely patent renal arteries  . Event monitor  09/02/2011-6/20/2/13    reason afib    Family History  Problem Relation Age of Onset  . Coronary artery disease Father   . Hypertension Mother   . Coronary artery disease Mother   . Colon cancer Brother 528   Social History:  reports that he quit smoking about 21 years ago. His smoking use included Cigarettes. He has a 22.5 pack-year smoking history. His smokeless tobacco use includes Chew. He reports that he does not drink alcohol or use illicit drugs.  Allergies: No Known Allergies  Medications: I have reviewed the patient's current medications.  Results for orders placed during the hospital encounter of 07/15/13 (from the past 48 hour(s))  CBC WITH DIFFERENTIAL     Status: Abnormal   Collection Time    07/15/13 10:22 AM      Result Value Ref Range   WBC 6.9  4.0 - 10.5 K/uL   RBC 3.28 (*) 4.22 - 5.81 MIL/uL   Hemoglobin 12.3 (*) 13.0 - 17.0 g/dL   HCT 37.6 (*) 39.0 - 52.0 %   MCV 114.6 (*) 78.0 - 100.0 fL   MCH 37.5 (*) 26.0 - 34.0 pg   MCHC 32.7  30.0 - 36.0 g/dL   RDW 13.6  11.5 - 15.5 %   Platelets 145 (*) 150 - 400 K/uL   Neutrophils Relative % 69  43 - 77 %   Lymphocytes Relative 19  12 - 46 %   Monocytes Relative 11  3 - 12 %   Eosinophils Relative 1  0 - 5 %   Basophils Relative 0  0 - 1 %   Neutro Abs 4.7  1.7 - 7.7 K/uL   Lymphs Abs 1.3  0.7 - 4.0 K/uL   Monocytes Absolute 0.8  0.1 - 1.0 K/uL   Eosinophils Absolute 0.1  0.0 - 0.7 K/uL   Basophils Absolute 0.0  0.0 - 0.1 K/uL   RBC Morphology POLYCHROMASIA PRESENT    BASIC METABOLIC PANEL      Status: Abnormal   Collection Time    07/15/13 10:22 AM      Result Value Ref Range   Sodium 137  137 - 147 mEq/L   Potassium 3.7  3.7 - 5.3 mEq/L   Chloride 90 (*) 96 - 112 mEq/L   CO2 30  19 - 32 mEq/L   Glucose, Bld 105 (*) 70 - 99 mg/dL   BUN 12  6 -  23 mg/dL   Creatinine, Ser 4.16 (*) 0.50 - 1.35 mg/dL   Calcium 9.6  8.4 - 10.5 mg/dL   GFR calc non Af Amer 13 (*) >90 mL/min   GFR calc Af Amer 15 (*) >90 mL/min   Comment: (NOTE)     The eGFR has been calculated using the CKD EPI equation.     This calculation has not been validated in all clinical situations.     eGFR's persistently <90 mL/min signify possible Chronic Kidney     Disease.  D-DIMER, QUANTITATIVE     Status: Abnormal   Collection Time    07/15/13 10:22 AM      Result Value Ref Range   D-Dimer, Quant 1.72 (*) 0.00 - 0.48 ug/mL-FEU   Comment:            AT THE INHOUSE ESTABLISHED CUTOFF     VALUE OF 0.48 ug/mL FEU,     THIS ASSAY HAS BEEN DOCUMENTED     IN THE LITERATURE TO HAVE     A SENSITIVITY AND NEGATIVE     PREDICTIVE VALUE OF AT LEAST     98 TO 99%.  THE TEST RESULT     SHOULD BE CORRELATED WITH     AN ASSESSMENT OF THE CLINICAL     PROBABILITY OF DVT / VTE.  MRSA PCR SCREENING     Status: None   Collection Time    07/15/13  1:51 PM      Result Value Ref Range   MRSA by PCR NEGATIVE  NEGATIVE   Comment:            The GeneXpert MRSA Assay (FDA     approved for NASAL specimens     only), is one component of a     comprehensive MRSA colonization     surveillance program. It is not     intended to diagnose MRSA     infection nor to guide or     monitor treatment for     MRSA infections.  HIV ANTIBODY (ROUTINE TESTING)     Status: None   Collection Time    07/15/13  2:19 PM      Result Value Ref Range   HIV NON REACTIVE  NON REACTIVE   Comment: (NOTE)     Effective July 17, 2013, [employer] will no longer offer the     current 3rd Generation HIV diagnostic screening assay, HIV  Antibodies,     HIV-1/2 EIA, with reflexes. At that time, [employer] will     only offer HIV-1/2 Ag/Ab, 4th Gen, w/ Reflexes as recommended by the     CDC. This HIV diagnostic screening assay tests for antibodies to HIV-1     and HIV-2 as well as HIV p24 antigen and provides greater sensitivity     for the detection of recent infection. Any orders for the 3rd     Generation assay will automatically be referred to the 4th Generation     assay.     Performed at [facility], BLOOD (ROUTINE X 2)     Status: None   Collection Time    07/15/13  2:19 PM      Result Value Ref Range   Specimen Description BLOOD RIGHT HAND     Special Requests       Value: BOTTLES DRAWN AEROBIC AND ANAEROBIC 10CC EACH BOTTLE   Culture NO GROWTH 1 DAY     Report  Status PENDING    CULTURE, BLOOD (ROUTINE X 2)     Status: None   Collection Time    07/15/13  2:46 PM      Result Value Ref Range   Specimen Description BLOOD RIGHT HAND     Special Requests       Value: BOTTLES DRAWN AEROBIC AND ANAEROBIC 8CC EACH BOTTLE   Culture NO GROWTH 1 DAY     Report Status PENDING    TROPONIN I     Status: None   Collection Time    07/15/13  7:16 PM      Result Value Ref Range   Troponin I <0.30  <0.30 ng/mL   Comment:            Due to the release kinetics of cTnI,     a negative result within the first hours     of the onset of symptoms does not rule out     myocardial infarction with certainty.     If myocardial infarction is still suspected,     repeat the test at appropriate intervals.  COMPREHENSIVE METABOLIC PANEL     Status: Abnormal   Collection Time    07/16/13  5:03 AM      Result Value Ref Range   Sodium 140  137 - 147 mEq/L   Potassium 4.2  3.7 - 5.3 mEq/L   Chloride 96  96 - 112 mEq/L   CO2 29  19 - 32 mEq/L   Glucose, Bld 101 (*) 70 - 99 mg/dL   BUN 28 (*) 6 - 23 mg/dL   Comment: DELTA CHECK NOTED   Creatinine, Ser 6.96 (*) 0.50 - 1.35 mg/dL   Calcium 8.6  8.4 - 10.5  mg/dL   Total Protein 7.6  6.0 - 8.3 g/dL   Albumin 2.8 (*) 3.5 - 5.2 g/dL   AST 21  0 - 37 U/L   ALT 13  0 - 53 U/L   Alkaline Phosphatase 90  39 - 117 U/L   Total Bilirubin 0.5  0.3 - 1.2 mg/dL   GFR calc non Af Amer 7 (*) >90 mL/min   GFR calc Af Amer 8 (*) >90 mL/min   Comment: (NOTE)     The eGFR has been calculated using the CKD EPI equation.     This calculation has not been validated in all clinical situations.     eGFR's persistently <90 mL/min signify possible Chronic Kidney     Disease.    Dg Chest 2 View  07/15/2013   CLINICAL DATA:  Shortness of breath.  EXAM: CHEST  2 VIEW  COMPARISON:  DG CHEST 1V PORT dated 03/13/2012; DG CHEST 1V PORT dated 10/14/2011; DG CHEST 2 VIEW dated 10/12/2011  FINDINGS: The heart size and mediastinal contours are stable with aortic atherosclerosis. The lungs are clear. There is no pleural effusion or pneumothorax. Old fracture of the right seventh rib appears stable. No acute osseous findings are evident. Telemetry leads overlie the chest.  IMPRESSION: Stable chest.  No acute cardiopulmonary process.   Electronically Signed   By: Camie Patience M.D.   On: 07/15/2013 10:57   Ct Chest Wo Contrast  07/16/2013   CLINICAL DATA:  Chest pain  EXAM: CT CHEST WITHOUT CONTRAST  TECHNIQUE: Multidetector CT imaging of the chest was performed following the standard protocol without IV contrast.  COMPARISON:  Plain film from the previous day  FINDINGS: The lungs are well aerated bilaterally. This some very mild  patchy ground-glass infiltrate is noted predominately within the left lung. Some changes are noted in the right upper lobe which demonstrates some mild nodularity and tree in bud appearance. No focal confluent infiltrate is seen. Multiple calcified lymph nodes are identified particularly in the right hilum. Scattered mediastinal lymph nodes are seen. The largest of these lies in the right paratracheal region and measures 17 mm in short axis. Heavy coronary  calcifications are seen. The upper abdomen shows multiple calcified splenic granulomas. No acute bony abnormality is seen.  IMPRESSION: Changes of prior granulomatous disease.  Multiple noncalcified with nodes within the mediastinum. Additionally knee patchy ground-glass infiltrate and tree in bud changes are noted bilaterally. Changes are most consistent with an infectious in etiology correlation with sputum cultures is recommended.   Electronically Signed   By: Inez Catalina M.D.   On: 07/16/2013 10:32    Review of Systems  Constitutional: Positive for fever and chills.  Respiratory: Positive for cough, sputum production and shortness of breath.   Gastrointestinal: Positive for nausea. Negative for vomiting.   Blood pressure 117/59, pulse 64, temperature 98.4 F (36.9 C), temperature source Oral, resp. rate 19, height _0  (1.753 m), weight 84.7 kg (186 lb 11.7 oz), SpO2 97.00%. Physical Exam  Constitutional: No distress.  Eyes: No scleral icterus.  Neck: No JVD present.  Cardiovascular: Normal heart sounds.  Exam reveals no friction rub.   No murmur heard. Respiratory: No respiratory distress. He has wheezes. He has rales. He exhibits no tenderness.  GI: He exhibits no distension. There is no tenderness.  Musculoskeletal: He exhibits no edema.    Assessment/Plan: Problem #1 end-stage renal disease: He is status post hemodialysis on Saturday. Patient presently denies any nausea no vomiting and his appetite is good. Patient is due for dialysis tomorrow. Problem #2 difficulty in breathing: Possibly from pneumonia. Presently patient feels somewhat better. He has still some cough. Problem #3 hypertension: His blood pressure has this moment seems to be stable and reasonably controlled. Problem #4 coronary artery disease: Status post MI and history of stent placement. Presently patient doesn't have any chest pain. Problem #5 GERD Problem #6 anemia: His hemoglobin and hematocrit is above a  target goal. Problem #7 history of moderate aortic stenosis Problem #8 history of arthritis. Problem #9 metabolic bone disease: His calcium is range. Plan: We'll make arrangements for patient to get dialysis tomorrow. Presently patient is asking to be to go home and get dialysis tomorrow with regular schedule as she is feeling better. Presently his potassium is normal range hence no need for urgent dialysis. We'll check his basic metabolic panel, phosphorus in the morning We'll hold Epogen during dialysis as his hemoglobin is above 12.   Dave Mannes S 07/17/2013, 8:00 AM

## 2013-07-17 NOTE — Progress Notes (Signed)
UR chart review completed.  

## 2013-07-17 NOTE — Progress Notes (Signed)
Subjective: The patient is alert and oriented. He continues to have some congestion and cough. He is being treated for possible early pneumonia with Fortaz and vancomycin. He does have end-stage renal disease and received dialysis yesterday. He does have a history of atrial fibrillation and aortic stenosis.  Objective: Vital signs in last 24 hours: Temp:  [98.3 F (36.8 C)-99.4 F (37.4 C)] 98.4 F (36.9 C) (04/06 0400) Pulse Rate:  [63-80] 64 (04/06 0400) Resp:  [15-22] 19 (04/06 0600) BP: (91-135)/(40-77) 117/59 mmHg (04/06 0600) SpO2:  [81 %-99 %] 97 % (04/06 0736) Weight:  [84.7 kg (186 lb 11.7 oz)] 84.7 kg (186 lb 11.7 oz) (04/06 0500) Weight change: -1.03 kg (-2 lb 4.3 oz) Last BM Date: 07/14/13  Intake/Output from previous day: 04/05 0701 - 04/06 0700 In: 240 [P.O.:240] Out: 0  Intake/Output this shift:    Physical Exam: Patient is alert and oriented  Lungs bilateral rhonchi  Heart regular rhythm grade 2 systolic murmur  Abdomen no palpable organs or masses  Extremities free of edema   Recent Labs  07/15/13 1022  WBC 6.9  HGB 12.3*  HCT 37.6*  PLT 145*   BMET  Recent Labs  07/15/13 1022 07/16/13 0503  NA 137 140  K 3.7 4.2  CL 90* 96  CO2 30 29  GLUCOSE 105* 101*  BUN 12 28*  CREATININE 4.16* 6.96*  CALCIUM 9.6 8.6    Studies/Results: Dg Chest 2 View  07/15/2013   CLINICAL DATA:  Shortness of breath.  EXAM: CHEST  2 VIEW  COMPARISON:  DG CHEST 1V PORT dated 03/13/2012; DG CHEST 1V PORT dated 10/14/2011; DG CHEST 2 VIEW dated 10/12/2011  FINDINGS: The heart size and mediastinal contours are stable with aortic atherosclerosis. The lungs are clear. There is no pleural effusion or pneumothorax. Old fracture of the right seventh rib appears stable. No acute osseous findings are evident. Telemetry leads overlie the chest.  IMPRESSION: Stable chest.  No acute cardiopulmonary process.   Electronically Signed   By: Roxy Horseman M.D.   On: 07/15/2013 10:57   Ct  Chest Wo Contrast  07/16/2013   CLINICAL DATA:  Chest pain  EXAM: CT CHEST WITHOUT CONTRAST  TECHNIQUE: Multidetector CT imaging of the chest was performed following the standard protocol without IV contrast.  COMPARISON:  Plain film from the previous day  FINDINGS: The lungs are well aerated bilaterally. This some very mild patchy ground-glass infiltrate is noted predominately within the left lung. Some changes are noted in the right upper lobe which demonstrates some mild nodularity and tree in bud appearance. No focal confluent infiltrate is seen. Multiple calcified lymph nodes are identified particularly in the right hilum. Scattered mediastinal lymph nodes are seen. The largest of these lies in the right paratracheal region and measures 17 mm in short axis. Heavy coronary calcifications are seen. The upper abdomen shows multiple calcified splenic granulomas. No acute bony abnormality is seen.  IMPRESSION: Changes of prior granulomatous disease.  Multiple noncalcified with nodes within the mediastinum. Additionally knee patchy ground-glass infiltrate and tree in bud changes are noted bilaterally. Changes are most consistent with an infectious in etiology correlation with sputum cultures is recommended.   Electronically Signed   By: Alcide Clever M.D.   On: 07/16/2013 10:32    Medications:  . albuterol  5 mg Nebulization Q6H  . allopurinol  100 mg Oral BID  . amiodarone  100 mg Oral QHS  . atorvastatin  10 mg Oral q1800  .  calcium-vitamin D  1 tablet Oral Q breakfast  . [START ON 07/18/2013] cefTAZidime (FORTAZ)  IV  2 [name] Intravenous Q T,Th,Sa-HD  . cinacalcet  30 mg Oral QODAY  . metoprolol tartrate  12.5 mg Oral BID  . multivitamin  1 tablet Oral Daily  . pantoprazole  40 mg Oral QAC breakfast  . PARoxetine  20 mg Oral Daily  . prasugrel  10 mg Oral Daily  . sevelamer carbonate  2,400 mg Oral TID WC  . [START ON 07/18/2013] vancomycin  1,000 mg Intravenous Q T,Th,Sa-HD        Assessment/Plan: 1.  Pneumonia plan to continue Fortaz and vancomycin  2. End-stage renal disease, plan to continue dialysis, nephrology consult current creatinine 6.96    LOS: 2 days   Roger [name] 07/17/2013, 8:43 AM

## 2013-07-18 ENCOUNTER — Encounter (HOSPITAL_COMMUNITY): Payer: Self-pay | Admitting: Cardiology

## 2013-07-18 DIAGNOSIS — J189 Pneumonia, unspecified organism: Principal | ICD-10-CM

## 2013-07-18 DIAGNOSIS — I2510 Atherosclerotic heart disease of native coronary artery without angina pectoris: Secondary | ICD-10-CM

## 2013-07-18 DIAGNOSIS — I359 Nonrheumatic aortic valve disorder, unspecified: Secondary | ICD-10-CM

## 2013-07-18 DIAGNOSIS — I4891 Unspecified atrial fibrillation: Secondary | ICD-10-CM

## 2013-07-18 DIAGNOSIS — N186 End stage renal disease: Secondary | ICD-10-CM

## 2013-07-18 LAB — BASIC METABOLIC PANEL
BUN: 54 mg/dL — AB (ref 6–23)
CHLORIDE: 95 meq/L — AB (ref 96–112)
CO2: 26 mEq/L (ref 19–32)
Calcium: 8.7 mg/dL (ref 8.4–10.5)
Creatinine, Ser: 11.07 mg/dL — ABNORMAL HIGH (ref 0.50–1.35)
GFR calc Af Amer: 5 mL/min — ABNORMAL LOW (ref >90)
GFR calc non Af Amer: 4 mL/min — ABNORMAL LOW (ref >90)
GLUCOSE: 97 mg/dL (ref 70–99)
POTASSIUM: 5 meq/L (ref 3.7–5.3)
Sodium: 140 mEq/L (ref 137–147)

## 2013-07-18 LAB — PHOSPHORUS: Phosphorus: 4.5 mg/dL (ref 2.3–4.6)

## 2013-07-18 MED ORDER — SODIUM CHLORIDE 0.9 % IV SOLN: 100.0000 mL | INTRAVENOUS | Status: DC | PRN

## 2013-07-18 MED ORDER — LIDOCAINE HCL (PF) 1 % IJ SOLN: 5.0000 mL | INTRAMUSCULAR | Status: DC | PRN

## 2013-07-18 MED ORDER — NEPRO/CARBSTEADY PO LIQD: 237.0000 mL | ORAL | Status: DC | PRN

## 2013-07-18 MED ORDER — ALTEPLASE 2 MG IJ SOLR: 2.0000 mg | Freq: Once | INTRAMUSCULAR | Status: AC | PRN

## 2013-07-18 MED ORDER — HEPARIN SODIUM (PORCINE) 1000 UNIT/ML DIALYSIS
1000.0000 [IU] | INTRAMUSCULAR | Status: DC | PRN
  Filled 2013-07-18: qty 1

## 2013-07-18 MED ORDER — LIDOCAINE-PRILOCAINE 2.5-2.5 % EX CREA
1.0000 "application " | TOPICAL_CREAM | CUTANEOUS | Status: DC | PRN
  Filled 2013-07-18: qty 5

## 2013-07-18 MED ORDER — DILTIAZEM HCL 100 MG IV SOLR
5.0000 mg/h | INTRAVENOUS | Status: DC
  Administered 2013-07-18: 5 mg/h via INTRAVENOUS
  Administered 2013-07-18: 15 mg/h via INTRAVENOUS
  Administered 2013-07-19: 10 mg/h via INTRAVENOUS
  Administered 2013-07-19: 15 mg/h via INTRAVENOUS
  Administered 2013-07-19: 10 mg/h via INTRAVENOUS
  Filled 2013-07-18: qty 100

## 2013-07-18 MED ORDER — PENTAFLUOROPROP-TETRAFLUOROETH EX AERO
1.0000 "application " | INHALATION_SPRAY | CUTANEOUS | Status: DC | PRN
  Filled 2013-07-18: qty 103.5

## 2013-07-18 NOTE — Consult Note (Signed)
Primary cardiologist: Dr. [name] Consulting cardiologist: Dr. Jonelle Sidle  Clinical Summary Roger [name] is a medically complex 73 y.o.male currently admitted to the hospital with pneumonia, on antibiotics per [name] with overall improvement. He has end-stage renal disease and continues on hemodialysis during hospitalization. He was noted to go into rapid atrial fibrillation during hemodialysis today. Nursing tells me that beta blocker was held prior to his session. Medications also include amiodarone, he has not been anticoagulated recently with prior history of GI bleeding based on review of Dr. Hazle Coca most recent office note in February.  ECG from presentation shows sinus rhythm, no significant ST segment changes. Followup tracing pending. He is in rapid atrial fibrillation on telemetry, heart rate in the 140s. Has developed relative hypotension with this, although blood pressure being checked on a leg cuff at present. He does not endorse any active chest pain, has had some shoulder discomfort. Is not aware of rapid palpitations at this time.   No Known Allergies  Medications Scheduled Medications: . acetylcysteine  3 mL Nebulization TID  . albuterol  2.5 mg Nebulization Q6H  . allopurinol  100 mg Oral BID  . amiodarone  100 mg Oral QHS  . atorvastatin  10 mg Oral q1800  . calcium-vitamin D  1 tablet Oral Q breakfast  . cefTAZidime (FORTAZ)  IV  2 g Intravenous Q T,Th,Sa-HD  . cinacalcet  30 mg Oral QODAY  . metoprolol tartrate  12.5 mg Oral BID  . multivitamin  1 tablet Oral Daily  . pantoprazole  40 mg Oral QAC breakfast  . PARoxetine  20 mg Oral Daily  . prasugrel  10 mg Oral Daily  . sevelamer carbonate  2,400 mg Oral TID WC  . vancomycin  1,000 mg Intravenous Q T,Th,Sa-HD    PRN Medications: sodium chloride, sodium chloride, acetaminophen, albuterol, alteplase, feeding supplement (NEPRO CARB STEADY), heparin, HYDROmorphone (DILAUDID) injection, lidocaine  (PF), lidocaine-prilocaine, nitroGLYCERIN, ondansetron (ZOFRAN) IV, pentafluoroprop-tetrafluoroeth   Past Medical History  Diagnosis Date  . HTN (hypertension)   . Arthritis   . Hypercholesteremia   . MI (myocardial infarction) 03/16/2011    Inferolateral ST segment elevation MI  . GERD (gastroesophageal reflux disease)   . Atrial fibrillation     Paroxysmal - 2 episodes related to ischemic NSTEMI events  5 & 09/2011  . Gout   . S/P coronary artery stent placement, 10/01/11 with DES to mid LAD and  PTCA of Diag 2 10/01/2009    Inferior STEMI 2011 - BMS to LCx; NSTEMI 5/13 with ISR/thrombosis of Cx BMS -->multiple DES to  LCx ; staged PCI of LAD /Balloon PTCA of major Diag 09/2011 for Sx (NSTEMI)   . Anemia   . Schatzki's ring   . Angiodysplasia of intestine   . Aortic stenosis, moderate     2D Echocardiogram 12/13 and 08/22/11 showed moderate aortic stenosis with valve area of 1.28 sq cm. EF of 45 to 50% with severe hypokinesia of the entire inferolateral myocardium  . ESRD on hemodialysis     [facility], TTS, esrd due to HTN  . Right renal artery stenosis 1998  . Ulcer     Past Surgical History  Procedure Laterality Date  . Ureteral stent placement    . Colonoscopy  04/2009    with EGD by Dr.Buccini moderately large hiatal hernia could contribute to his anemia,Schatzki ring which might accout for his intermittent dysphagia/+internal hemorrhoids ;family hx of colon ca  . Esophagogastroduodenoscopy  03/2010  by Dr.Magod/small hiatal hernia with widley patent fibrous ring,minimal bulbitis otherwise nl  . Dialysis fistula creation    . Coronary angioplasty with stent placement  08/2011 & 09/2011    Integrity Resolute DES Stents  Stent #1:  2.10225mm x 14 mm (extending to just distal to the bifurcation to the mid OM, Stent #2:  2.7575mm x 18mm  Stent #3:  3.260mm x 18mm; covering proximal portion of the old stent into the proximal circumflex and overlapping stent #2 distally, Stent #4: 2.3025mm x  8mm (4 overlapping in LCx- OM1; 1 in mid LAD; PTCA only of Major Diag (small caliber)  . Coronary angioplasty with stent placement  2011, 2012    Dr Nanetta BattyJonathan Berry  03/16/11  re-cannulized totally occluded circumflex, stented with Integrity bare metal stent focal 90% stenosis in LAD and diagonal branch   . Givens capsule study  04/16/10    SB erosions vs. AVMs, non-specific distal SB inflammation in setting ASA, plavix, NSAIDs  . Esophagogastroduodenoscopy  03/14/2012    Procedure: ESOPHAGOGASTRODUODENOSCOPY (EGD);  Surgeon: Iva Booparl E Gessner, MD;  [facility];  Service: Endoscopy;  Laterality: N/A;  . Colonoscopy  03/16/2012    Procedure: COLONOSCOPY;  Surgeon: Iva Booparl E Gessner, MD;  [facility];  Service: Endoscopy;  Laterality: N/A;  . Right renal artery, pta and stenting  08/22/1996    Dr Nanetta BattyJonathan Berry, P-154 Palmaz stent mounted on a 6mm x 2cm PowerFlex and deployed in the renal ostium at 7 atmospheres  reduction of 95% lesion to 0% residual without dissection    . Enteroscopy N/A 07/06/2012    Procedure: ENTEROSCOPY;  Surgeon: Hilarie FredricksonJohn N Perry, MD;  [facility];  Service: Endoscopy;  Laterality: N/A;  . Doppler echocardiography  03/15/2012-[facility]    LV 45-50%  . Nm myocar perf wall motion  05/02/2010    EF49% ; mod perfusion basal inferolateral, basal anterolateral,mid inferolat and mid anterolateral region  . Carotid doppler  04/20/2011    lright common iliac artery0-49%,left common iliac artery 0-49%  . Renal angigram  01/02/2004    C02 angiography-wideely patent renal arteries  . Event monitor  09/02/2011-6/20/2/13    reason afib    Family History  Problem Relation Age of Onset  . Coronary artery disease Father   . Hypertension Mother   . Coronary artery disease Mother   . Colon cancer Brother [id number]    Social History [name] reports that he quit smoking about 21 years ago. His smoking use included Cigarettes. He has a 22.5 pack-year smoking history. His  smokeless tobacco use includes Chew. [name] reports that he does not drink alcohol.  Review of Systems Still with cough, pleuritic discomfort in his chest with this. No rigors. Was not aware of onset of atrial fibrillation during dialysis. Appetite has been stable.  Physical Examination Blood pressure 124/64, pulse 72, temperature 98.2 F (36.8 C), temperature source Oral, resp. rate 24, height 5\' 9"  (1.753 m), weight 187 lb 13.3 oz (85.2 kg), SpO2 96.00%.  Intake/Output Summary (Last 24 hours) at 07/18/13 1510 Last data filed at 07/18/13 0957  Gross per 24 hour  Intake    540 ml  Output      0 ml  Net    540 ml   Telemetry: Rapid atrial fibrillation.  HEENT: Conjunctiva and lids normal, oropharynx clear. Neck: Supple, no carotid bruits, no thyromegaly. Lungs: Coarse breath sounds, nonlabored breathing at rest. Cardiac: Rapid, irregularly irregular, no S3, soft systolic murmur, no pericardial rub.  Abdomen: Soft, nontender, bowel sounds present, no guarding or rebound. Extremities: No pitting edema, distal pulses 1-2+. Skin: Warm and dry. Musculoskeletal: No kyphosis. Neuropsychiatric: Alert and oriented x3, affect grossly appropriate.   Lab Results  Basic Metabolic Panel:  Recent Labs Lab 07/15/13 1022 07/16/13 0503 07/18/13 0415  NA 137 140 140  K 3.7 4.2 5.0  CL 90* 96 95*  CO2 30 29 26   GLUCOSE 105* 101* 97  BUN 12 28* 54*  CREATININE 4.16* 6.96* 11.07*  CALCIUM 9.6 8.6 8.7  PHOS  --   --  4.5    Liver Function Tests:  Recent Labs Lab 07/16/13 0503  AST 21  ALT 13  ALKPHOS 90  BILITOT 0.5  PROT 7.6  ALBUMIN 2.8*    CBC:  Recent Labs Lab 07/15/13 1022  WBC 6.9  NEUTROABS 4.7  HGB 12.3*  HCT 37.6*  MCV 114.6*  PLT 145*    Cardiac Enzymes:  Recent Labs Lab 07/15/13 1916  TROPONINI <0.30    Imaging EXAM: CT CHEST WITHOUT CONTRAST  TECHNIQUE: Multidetector CT imaging of the chest was performed following the standard protocol  without IV contrast.  COMPARISON: Plain film from the previous day  FINDINGS: The lungs are well aerated bilaterally. This some very mild patchy ground-glass infiltrate is noted predominately within the left lung. Some changes are noted in the right upper lobe which demonstrates some mild nodularity and tree in bud appearance. No focal confluent infiltrate is seen. Multiple calcified lymph nodes are identified particularly in the right hilum. Scattered mediastinal lymph nodes are seen. The largest of these lies in the right paratracheal region and measures 17 mm in short axis. Heavy coronary calcifications are seen. The upper abdomen shows multiple calcified splenic granulomas. No acute bony abnormality is seen.  IMPRESSION: Changes of prior granulomatous disease.  Multiple noncalcified with nodes within the mediastinum. Additionally knee patchy ground-glass infiltrate and tree in bud changes are noted bilaterally. Changes are most consistent with an infectious in etiology correlation with sputum cultures is recommended.   Impression  1. Episode of rapid atrial fibrillation during hemodialysis, patient with known history of PAF. He is on amiodarone and beta blocker at baseline, not anticoagulated with prior history of GI bleed. Beta blocker dose was held prior to hemodialysis today.  2. Pneumonia, currently on Fortaz and vancomycin per [name]. Could also be contributing to problem #1.  3. End-stage renal disease on hemodialysis.  4. History of moderate aortic stenosis.  5. CAD status post prior percutaneous interventions, see medical history for details.   Recommendations  For now continue amiodarone, will start intravenous diltiazem at low-dose, hopefully patient will convert spontaneously with better heart rate control. If not could consider use of IV amiodarone, doubt he will need electrical cardioversion unless he becomes hemodynamically unstable. Will need to try  and keep on baseline low-dose beta blocker or convert to oral low-dose diltiazem depending on efficacy. No anticoagulation based on prior history of GI bleeding.  Jonelle Sidle, M.D., F.A.C.C.

## 2013-07-18 NOTE — Procedures (Signed)
   HEMODIALYSIS TREATMENT NOTE:  4 hour heparin-free dialysis ordered via left upper arm AVF (15g ante/retrograde).  2.5 hours into session, pt converted from NSR to atrial fibrillation with HR 100-120.  He was asymptomatic and has a documented history of Afib. Metoprolol had been held prior to hemodialysis. Ultrafiltration was discontinued when HR increased to 130-140s and Dr. Kristian CoveyBefekadu was notified of pt's condition.  Order was received to terminate treatment.  All blood was reinfused and hemostasis was achieved within 10 minutes.  Report was given to Thurnell LoseAnna Stone RN, who discussed situation with Dr. Renard MatterMcInnis.  Cardiology consult was ordered.  Net ultrafiltrate = 2.6 liters.  Total HD time = 3.5 hours.    Annaka Cleaver L. Rosaelena Kemnitz, RN, CDN

## 2013-07-18 NOTE — Progress Notes (Signed)
Subjective: The patient is alert and oriented and had a fairly good night. He has coughed occasionally but thinks this has improved  Objective: Vital signs in last 24 hours: Temp:  [98.3 F (36.8 C)-100.6 F (38.1 C)] 99 F (37.2 C) (04/07 0400) Pulse Rate:  [62-89] 64 (04/07 0300) Resp:  [13-24] 17 (04/07 0400) BP: (92-164)/(38-105) 129/42 mmHg (04/07 0400) SpO2:  [85 %-99 %] 96 % (04/07 0300) Weight:  [86 kg (189 lb 9.5 oz)] 86 kg (189 lb 9.5 oz) (04/07 0500) Weight change: 1.3 kg (2 lb 13.9 oz) Last BM Date: 07/14/13  Intake/Output from previous day: 04/06 0701 - 04/07 0700 In: 540 [P.O.:540] Out: -  Intake/Output this shift:    Physical Exam: The patient is alert and oriented  Lungs bilateral rhonchi  Heart regular rhythm grade 2 systolic murmur  Abdomen the palpable organs or masses  Extremities free of edema   Recent Labs  07/15/13 1022  WBC 6.9  HGB 12.3*  HCT 37.6*  PLT 145*   BMET  Recent Labs  07/16/13 0503 07/18/13 0415  NA 140 140  K 4.2 5.0  CL 96 95*  CO2 29 26  GLUCOSE 101* 97  BUN 28* 54*  CREATININE 6.96* 11.07*  CALCIUM 8.6 8.7    Studies/Results: Ct Chest Wo Contrast  07/16/2013   CLINICAL DATA:  Chest pain  EXAM: CT CHEST WITHOUT CONTRAST  TECHNIQUE: Multidetector CT imaging of the chest was performed following the standard protocol without IV contrast.  COMPARISON:  Plain film from the previous day  FINDINGS: The lungs are well aerated bilaterally. This some very mild patchy ground-glass infiltrate is noted predominately within the left lung. Some changes are noted in the right upper lobe which demonstrates some mild nodularity and tree in bud appearance. No focal confluent infiltrate is seen. Multiple calcified lymph nodes are identified particularly in the right hilum. Scattered mediastinal lymph nodes are seen. The largest of these lies in the right paratracheal region and measures 17 mm in short axis. Heavy coronary calcifications  are seen. The upper abdomen shows multiple calcified splenic granulomas. No acute bony abnormality is seen.  IMPRESSION: Changes of prior granulomatous disease.  Multiple noncalcified with nodes within the mediastinum. Additionally knee patchy ground-glass infiltrate and tree in bud changes are noted bilaterally. Changes are most consistent with an infectious in etiology correlation with sputum cultures is recommended.   Electronically Signed   By: Alcide CleverMark  Lukens M.D.   On: 07/16/2013 10:32    Medications:  . acetylcysteine  3 mL Nebulization TID  . albuterol  2.5 mg Nebulization Q6H  . allopurinol  100 mg Oral BID  . amiodarone  100 mg Oral QHS  . atorvastatin  10 mg Oral q1800  . calcium-vitamin D  1 tablet Oral Q breakfast  . cefTAZidime (FORTAZ)  IV  2 g Intravenous Q T,Th,Sa-HD  . cinacalcet  30 mg Oral QODAY  . metoprolol tartrate  12.5 mg Oral BID  . multivitamin  1 tablet Oral Daily  . pantoprazole  40 mg Oral QAC breakfast  . PARoxetine  20 mg Oral Daily  . prasugrel  10 mg Oral Daily  . sevelamer carbonate  2,400 mg Oral TID WC  . vancomycin  1,000 mg Intravenous Q T,Th,Sa-HD        Assessment/Plan: 1. Pneumonia bronchitis plan to continue Fortaz and vancomycin intravenously  End-stage renal disease the patient will have dialysis today and will be seen again by nephrology   LOS: 3  days   Mitchell Iwanicki G 07/18/2013, 6:32 AM

## 2013-07-18 NOTE — Progress Notes (Signed)
Subjective: Interval History: has no complaint of nausea or vomiting. His breathing is much better. He has some cough but no sputum production. Overall he has seen some improvement..  Objective: Vital signs in last 24 hours: Temp:  [98.3 F (36.8 C)-100.6 F (38.1 C)] 99 F (37.2 C) (04/07 0400) Pulse Rate:  [62-89] 64 (04/07 0300) Resp:  [13-24] 17 (04/07 0400) BP: (93-164)/(42-105) 129/42 mmHg (04/07 0400) SpO2:  [85 %-99 %] 93 % (04/07 0702) Weight:  [86 kg (189 lb 9.5 oz)] 86 kg (189 lb 9.5 oz) (04/07 0500) Weight change: 1.3 kg (2 lb 13.9 oz)  Intake/Output from previous day: 04/06 0701 - 04/07 0700 In: 540 [P.O.:540] Out: -  Intake/Output this shift:    General appearance: alert, cooperative and no distress Resp: rales posterior - bilateral Cardio: regular rate and rhythm, S1, S2 normal, no murmur, click, rub or gallop GI: soft, non-tender; bowel sounds normal; no masses,  no organomegaly Extremities: extremities normal, atraumatic, no cyanosis or edema  Lab Results:  Recent Labs  07/15/13 1022  WBC 6.9  HGB 12.3*  HCT 37.6*  PLT 145*   BMET:  Recent Labs  07/16/13 0503 07/18/13 0415  NA 140 140  K 4.2 5.0  CL 96 95*  CO2 29 26  GLUCOSE 101* 97  BUN 28* 54*  CREATININE 6.96* 11.07*  CALCIUM 8.6 8.7   No results found for this basename: PTH,  in the last 72 hours Iron Studies: No results found for this basename: IRON, TIBC, TRANSFERRIN, FERRITIN,  in the last 72 hours  Studies/Results: Ct Chest Wo Contrast  07/16/2013   CLINICAL DATA:  Chest pain  EXAM: CT CHEST WITHOUT CONTRAST  TECHNIQUE: Multidetector CT imaging of the chest was performed following the standard protocol without IV contrast.  COMPARISON:  Plain film from the previous day  FINDINGS: The lungs are well aerated bilaterally. This some very mild patchy ground-glass infiltrate is noted predominately within the left lung. Some changes are noted in the right upper lobe which demonstrates some  mild nodularity and tree in bud appearance. No focal confluent infiltrate is seen. Multiple calcified lymph nodes are identified particularly in the right hilum. Scattered mediastinal lymph nodes are seen. The largest of these lies in the right paratracheal region and measures 17 mm in short axis. Heavy coronary calcifications are seen. The upper abdomen shows multiple calcified splenic granulomas. No acute bony abnormality is seen.  IMPRESSION: Changes of prior granulomatous disease.  Multiple noncalcified with nodes within the mediastinum. Additionally knee patchy ground-glass infiltrate and tree in bud changes are noted bilaterally. Changes are most consistent with an infectious in etiology correlation with sputum cultures is recommended.   Electronically Signed   By: Alcide Clever M.D.   On: 07/16/2013 10:32    I have reviewed the patient's current medications.  Assessment/Plan: Problem #1 difficulty in breathing: Most likely from pneumonia versus CHF. Presently patient seems to be feeling much better. Problem #2 end-stage renal disease is status post hemodialysis on Saturday. His potassium is normal and presently patient does not have any uremic sign and symptoms. Patient is due for dialysis today. Problem #3 pneumonia: He is on antibiotics. Patient presently afebrile and his white blood cell count is normal.  Problem #4 anemia: His hemoglobin is above range. Problem #5 hypertension: His blood pressure is reasonably controlled. Problem #6 metabolic bone disease: His calcium and phosphorus isn't range. Problem #7 coronary disease presently is a symptomatic. Plan: We'll make arrangements for patient to  get dialysis today which is his regular schedule. If patient is going to be discharged he'll be followed by his primary care nephrologist as outpatient. We'll check his CBC and basic metabolic panel in the morning.   LOS: 3 days   Roger [name] S 07/18/2013,7:46 AM

## 2013-07-18 NOTE — Progress Notes (Signed)
Patient went into Afib with RVR during dialysis treatment. Rinsed back with 30 minutes remaining. Report received from Arman FilterAngela Poteat RN, dialysis nurse. Dr Renard MatterMcinnis notified by phone, and cardiology consult ordered. Dr [name] notified in person of consult.

## 2013-07-18 NOTE — Care Management Note (Addendum)
    Page 1 of 1   07/21/2013     1:01:46 PM   CARE MANAGEMENT NOTE 07/21/2013  Patient:  Roger [name],Roger [name]   Account Number:  [id number]  Date Initiated:  07/18/2013  Documentation initiated by:  Sharrie RothmanBLACKWELL,Emori Mumme C  Subjective/Objective Assessment:   Pt admitted from home with pneumonia. Pt lives with his wife and will return home at discharge. Pt is fairly independent with ADl's but does have a cane for prn use. Pt receives dialysis 3 times a week at [facility] in [place] and pts     Action/Plan:   wife transports him both ways. Pts [facility].   Anticipated DC Date:  07/21/2013   Anticipated DC Plan:  HOME/SELF CARE      DC Planning Services  CM consult      Choice offered to / List presented to:             Status of service:  Completed, signed off Medicare Important Message given?  YES (If response is "NO", the following Medicare IM given date fields will be blank) Date Medicare IM given:  07/21/2013 Date Additional Medicare IM given:    Discharge Disposition:  HOME/SELF CARE  Per UR Regulation:    If discussed at Long Length of Stay Meetings, dates discussed:   07/20/2013    Comments:  07/21/13 1300 Arlyss Queenammy Nurah Petrides, RN BSN CM Pt for potential discharge over the weekend. Pt still on cardizem gtt. No HH needs.  07/18/13 1455 Arlyss Queenammy Letecia Arps ,RN BSN CM

## 2013-07-19 LAB — BASIC METABOLIC PANEL
BUN: 25 mg/dL — ABNORMAL HIGH (ref 6–23)
CALCIUM: 8.6 mg/dL (ref 8.4–10.5)
CO2: 30 mEq/L (ref 19–32)
Chloride: 97 mEq/L (ref 96–112)
Creatinine, Ser: 6.53 mg/dL — ABNORMAL HIGH (ref 0.50–1.35)
GFR, EST AFRICAN AMERICAN: 9 mL/min — AB (ref >90)
GFR, EST NON AFRICAN AMERICAN: 8 mL/min — AB (ref >90)
Glucose, Bld: 119 mg/dL — ABNORMAL HIGH (ref 70–99)
POTASSIUM: 4.3 meq/L (ref 3.7–5.3)
SODIUM: 142 meq/L (ref 137–147)

## 2013-07-19 LAB — CBC
HCT: 27.5 % — ABNORMAL LOW (ref 39.0–52.0)
HEMOGLOBIN: 8.9 g/dL — AB (ref 13.0–17.0)
MCH: 37.2 pg — ABNORMAL HIGH (ref 26.0–34.0)
MCHC: 32.4 g/dL (ref 30.0–36.0)
MCV: 115.1 fL — ABNORMAL HIGH (ref 78.0–100.0)
PLATELETS: 174 10*3/uL (ref 150–400)
RBC: 2.39 MIL/uL — AB (ref 4.22–5.81)
RDW: 13.7 % (ref 11.5–15.5)
WBC: 4 10*3/uL (ref 4.0–10.5)

## 2013-07-19 NOTE — Progress Notes (Signed)
On call cardiology Dr Donnetta SimpersYousef updated on pt no longer in A-fib with 12 lead showing ? Junctional with rate 113

## 2013-07-19 NOTE — Progress Notes (Signed)
ANTIBIOTIC CONSULT NOTE -   Pharmacy Consult for Vancomycin & Elita Quick Indication: pneumonia  No Known Allergies  Patient Measurements: Height: 5\' 9"  (175.3 cm) Weight: 183 lb 13.8 oz (83.4 kg) IBW/kg (Calculated) : 70.7  Vital Signs: Temp: 98.2 F (36.8 C) (04/08 0800) Temp src: Oral (04/08 0800) BP: 107/56 mmHg (04/08 1000) Pulse Rate: 120 (04/08 0951) Intake/Output from previous day: 04/07 0701 - 04/08 0700 In: 868 [P.O.:480; I.V.:138; IV Piggyback:250] Out: 2686  Intake/Output from this shift: Total I/O In: 33.6 [I.V.:33.6] Out: -   Labs:  Recent Labs  07/18/13 0415 07/19/13 0350  WBC  --  4.0  HGB  --  8.9*  PLT  --  174  CREATININE 11.07* 6.53*   Estimated Creatinine Clearance: 10.4 ml/min (by C-G formula based on Cr of 6.53). No results found for this basename: VANCOTROUGH, Leodis Binet, VANCORANDOM, GENTTROUGH, GENTPEAK, GENTRANDOM, TOBRATROUGH, TOBRAPEAK, TOBRARND, AMIKACINPEAK, AMIKACINTROU, AMIKACIN,  in the last 72 hours   Microbiology: Recent Results (from the past 720 hour(s))  MRSA PCR SCREENING     Status: None   Collection Time    07/15/13  1:51 PM      Result Value Ref Range Status   MRSA by PCR NEGATIVE  NEGATIVE Final   Comment:            The GeneXpert MRSA Assay (FDA     approved for NASAL specimens     only), is one component of a     comprehensive MRSA colonization     surveillance program. It is not     intended to diagnose MRSA     infection nor to guide or     monitor treatment for     MRSA infections.  CULTURE, BLOOD (ROUTINE X 2)     Status: None   Collection Time    07/15/13  2:19 PM      Result Value Ref Range Status   Specimen Description BLOOD RIGHT HAND   Final   Special Requests     Final   Value: BOTTLES DRAWN AEROBIC AND ANAEROBIC 10CC EACH BOTTLE   Culture NO GROWTH 3 DAYS   Final   Report Status PENDING   Incomplete  CULTURE, BLOOD (ROUTINE X 2)     Status: None   Collection Time    07/15/13  2:46 PM      Result  Value Ref Range Status   Specimen Description BLOOD RIGHT HAND   Final   Special Requests     Final   Value: BOTTLES DRAWN AEROBIC AND ANAEROBIC 8CC EACH BOTTLE   Culture NO GROWTH 3 DAYS   Final   Report Status PENDING   Incomplete  CULTURE, EXPECTORATED SPUTUM-ASSESSMENT     Status: None   Collection Time    07/17/13  9:30 PM      Result Value Ref Range Status   Specimen Description SPUTUM   Final   Special Requests NONE   Final   Sputum evaluation     Final   Value: THIS SPECIMEN IS ACCEPTABLE. RESPIRATORY CULTURE REPORT TO FOLLOW.   Report Status 07/17/2013 FINAL   Final  CULTURE, RESPIRATORY (NON-EXPECTORATED)     Status: None   Collection Time    07/17/13  9:30 PM      Result Value Ref Range Status   Specimen Description SPUTUM   Final   Special Requests NONE   Final   Gram Stain     Final   Value: RARE WBC PRESENT,BOTH PMN AND  MONONUCLEAR     RARE SQUAMOUS EPITHELIAL CELLS PRESENT     NO ORGANISMS SEEN     Performed at [facility]   Culture     Final   Value: NO GROWTH 1 DAY     Performed at [facility]   Report Status PENDING   Incomplete   Medical History: Past Medical History  Diagnosis Date  . HTN (hypertension)   . Arthritis   . Hypercholesteremia   . MI (myocardial infarction) 03/16/2011    Inferolateral ST segment elevation MI  . GERD (gastroesophageal reflux disease)   . Atrial fibrillation     Paroxysmal - 2 episodes related to ischemic NSTEMI events  5 & 09/2011  . Gout   . S/P coronary artery stent placement, 10/01/11 with DES to mid LAD and  PTCA of Diag 2 10/01/2009    Inferior STEMI 2011 - BMS to LCx; NSTEMI 5/13 with ISR/thrombosis of Cx BMS -->multiple DES to  LCx ; staged PCI of LAD /Balloon PTCA of major Diag 09/2011 for Sx (NSTEMI)   . Anemia   . Schatzki's ring   . Angiodysplasia of intestine   . Aortic stenosis, moderate     2D Echocardiogram 12/13 and 08/22/11 showed moderate aortic stenosis with valve area of 1.28 sq cm. EF of  45 to 50% with severe hypokinesia of the entire inferolateral myocardium  . ESRD on hemodialysis     [place] HD, TTS, esrd due to HTN  . Right renal artery stenosis 1998  . Ulcer    Medications:  Scheduled:  . acetylcysteine  3 mL Nebulization TID  . albuterol  2.5 mg Nebulization Q6H  . allopurinol  100 mg Oral BID  . amiodarone  100 mg Oral QHS  . atorvastatin  10 mg Oral q1800  . calcium-vitamin D  1 tablet Oral Q breakfast  . cefTAZidime (FORTAZ)  IV  2 g Intravenous Q T,Th,Sa-HD  . cinacalcet  30 mg Oral QODAY  . metoprolol tartrate  12.5 mg Oral BID  . multivitamin  1 tablet Oral Daily  . pantoprazole  40 mg Oral QAC breakfast  . PARoxetine  20 mg Oral Daily  . prasugrel  10 mg Oral Daily  . sevelamer carbonate  2,400 mg Oral TID WC  . vancomycin  1,000 mg Intravenous Q T,Th,Sa-HD   Assessment: 73 yo M with ESRD requiring HD presents with shortness of breath, clinical presentation of PNA.     He was initially started on Rocephin & Zithromax in ED, however discussed with MD and plan to broaden coverage since patient meets criteria for HCAP (dialysis ctr) and possible sepsis.  Pt is currently afebrile.    Vancomycin 4/4>> Elita QuickFortaz 4/4>> Rocephin 4/4>>4/4 Zithromax 4/4>>4/4  Goal of Therapy:  Pre-HD level of 15-25 mcg/ml  Plan:  Vancomycin 1gm IV qHD Fortaz 2gm IV qHD Check pre-HD Vancomycin level at steady state Monitor labs and cx data   KeySpanScott A Gerod Caligiuri 07/19/2013,12:16 PM

## 2013-07-19 NOTE — Progress Notes (Addendum)
Subjective:  Breathing "fine". No complaints. Doesn't feel rapid atrial fib.  Objective:  Vital Signs in the last 24 hours: Temp:  [98 F (36.7 C)-99.5 F (37.5 C)] 98.7 F (37.1 C) (04/08 0400) Pulse Rate:  [25-145] 113 (04/08 0400) Resp:  [14-29] 18 (04/08 0400) BP: (72-165)/(38-114) 118/60 mmHg (04/08 0400) SpO2:  [88 %-100 %] 96 % (04/08 0647) Weight:  [183 lb 13.8 oz (83.4 kg)-187 lb 13.3 oz (85.2 kg)] 183 lb 13.8 oz (83.4 kg) (04/08 0500)  Intake/Output from previous day: 04/07 0701 - 04/08 0700 In: 868 [P.O.:480; I.V.:138; IV Piggyback:250] Out: 2686  Intake/Output from this shift:    Physical Exam: NECK: Without JVD, HJR, or bruit LUNGS:Decrease breath sounds with scattered rhonchi and crackles right base HEART: Irregular rate and rhythm at 122/m, no murmur, gallop, rub, bruit, thrill, or heave EXTREMITIES: Without cyanosis, clubbing, or edema  EKG: read out is junctional rhythm but looks like atrial fibrillation at 122/m  Lab Results:  Recent Labs  07/19/13 0350  WBC 4.0  HGB 8.9*  PLT 174    Recent Labs  07/18/13 0415 07/19/13 0350  NA 140 142  K 5.0 4.3  CL 95* 97  CO2 26 30  GLUCOSE 97 119*  BUN 54* 25*  CREATININE 11.07* 6.53*   No results found for this basename: TROPONINI, CK, MB,  in the last 72 hours Hepatic Function Panel No results found for this basename: PROT, ALBUMIN, AST, ALT, ALKPHOS, BILITOT, BILIDIR, IBILI,  in the last 72 hours No results found for this basename: CHOL,  in the last 72 hours No results found for this basename: PROTIME,  in the last 72 hours  Imaging: 2Decho 02/2013: Study Conclusions  - Left ventricle: There was severe concentric hypertrophy.   Systolic function was normal. The estimated ejection   fraction was in the range of 60% to 65%. - Aortic valve: Mild to moderate regurgitation. Valve area:   0.88cm^2(VTI). Valve area: 0.82cm^2 (Vmax). - Mitral valve: Mild regurgitation. Valve area by pressure  half-time: 2.04cm^2. Valve area by continuity equation   (using LVOT flow): 1.94cm^2.  Cardiac Studies:  Assessment/Plan:  1. Episode of rapid atrial fibrillation during hemodialysis, patient with known history of PAF. He is on amiodarone and beta blocker at baseline, not anticoagulated with prior history of GI bleed. Beta blocker dose was held prior to hemodialysis yesterday. Still going fast on IV Diltiazem at 5mg /hr. Will increase.  2. Pneumonia, currently on Fortaz and vancomycin per Dr. Renard MatterMcInnis. Could also be contributing to problem #1.  3. End-stage renal disease on hemodialysis.  4. History of moderate aortic stenosis.  5. CAD status post prior percutaneous interventions, see medical history for details.  6. Anemia: Hbg 8.9. Was 12.3 on 07/15/13. ? GI consult with history of GI bleed?    LOS: 4 days    Dyann KiefMichele M Lenze 07/19/2013, 8:57 AM   Attending Note Patient seen and discussed with PA Geni BersLenze, agree with documentation above. 73 yo male hx of CAD with prior stents, ESRD, HL, HTN, afib, moderate AS, prior echo 02/2013 LVEF 60-65%. admitted with pneumonia. Cardiology was consulted for afib with RVR during HD. Per notes beta blocker had been held prior to HD. He has not been on anticoagulation by his primary cardiologist due to history of GI bleed. Up to 15 on dilt with rates 100-110s, would expect in setting of pneumonia he would have some tachycardia. BP holding with systolics in the high 90s.  Will follow up rates later this  afternoon, if persistently elevated will load with amio later today.  Dina Rich MD    617pm Addendum Up to 15 on dilt gtt, rates better controlled 80s-90s. SBP in the 110s. Continue dilt gtt tonight, likely start oral AV nodal agent tomorrow to wean drip as bp continues to improve   Dina Rich MD

## 2013-07-19 NOTE — Progress Notes (Signed)
Subjective: The patient is alert and oriented he did develop atrial fibrillation with rapid ventricular rate he does have end-stage renal disease and had dialysis yesterday he is on amiodarone and beta blocker. His ventricular rate is still around 100 he is being treated for pneumonia with Fortaz and vancomycin.  Objective: Vital signs in last 24 hours: Temp:  [98 F (36.7 C)-99.5 F (37.5 C)] 98.7 F (37.1 C) (04/08 0400) Pulse Rate:  [25-145] 113 (04/08 0400) Resp:  [14-29] 18 (04/08 0400) BP: (72-165)/(38-114) 118/60 mmHg (04/08 0400) SpO2:  [88 %-100 %] 96 % (04/08 0647) Weight:  [83.4 kg (183 lb 13.8 oz)-85.2 kg (187 lb 13.3 oz)] 83.4 kg (183 lb 13.8 oz) (04/08 0500) Weight change: -0.8 kg (-1 lb 12.2 oz) Last BM Date: 07/17/13  Intake/Output from previous day: 04/07 0701 - 04/08 0700 In: 868 [P.O.:480; I.V.:138; IV Piggyback:250] Out: 2686  Intake/Output this shift: Total I/O In: 346 [P.O.:240; I.V.:106] Out: -   Physical Exam: HEENT negative  Neck supple no JVD or thyroid abnormalities  Lungs occasional rhonchus heard over lung field  Heart rapid irregular rhythm  Abdomen no palpable organs or masses  Extremities free of edema   Recent Labs  07/19/13 0350  WBC 4.0  HGB 8.9*  HCT 27.5*  PLT 174   BMET  Recent Labs  07/18/13 0415 07/19/13 0350  NA 140 142  K 5.0 4.3  CL 95* 97  CO2 26 30  GLUCOSE 97 119*  BUN 54* 25*  CREATININE 11.07* 6.53*  CALCIUM 8.7 8.6    Studies/Results: No results found.  Medications:  . acetylcysteine  3 mL Nebulization TID  . albuterol  2.5 mg Nebulization Q6H  . allopurinol  100 mg Oral BID  . amiodarone  100 mg Oral QHS  . atorvastatin  10 mg Oral q1800  . calcium-vitamin D  1 tablet Oral Q breakfast  . cefTAZidime (FORTAZ)  IV  2 g Intravenous Q T,Th,Sa-HD  . cinacalcet  30 mg Oral QODAY  . metoprolol tartrate  12.5 mg Oral BID  . multivitamin  1 tablet Oral Daily  . pantoprazole  40 mg Oral QAC breakfast   . PARoxetine  20 mg Oral Daily  . prasugrel  10 mg Oral Daily  . sevelamer carbonate  2,400 mg Oral TID WC  . vancomycin  1,000 mg Intravenous Q T,Th,Sa-HD    . diltiazem (CARDIZEM) infusion 7 mg/hr (07/19/13 0400)     Assessment/Plan: 1. Pneumonia plan to continue IV Fortaz and vancomycin  2. End-stage renal disease patient on dialysis will be dialyzed again tomorrow  3 aortic stenosis, coronary artery disease,  4. Atrial fibrillation. Continue beta blocker and amiodarone-patient will be seen again today by cardiology.   LOS: 4 days   Roger [name] 07/19/2013, 6:49 AM

## 2013-07-19 NOTE — Progress Notes (Signed)
Roger [name]  MRN: [id number]  DOB/AGE: March 09, 1941 73 y.o.  Primary Care Physician:MCINNIS,ANGUS G, MD  Admit date: 07/15/2013  Chief Complaint:  Chief Complaint  Patient presents with  . Shortness of Breath    S-Pt presented on  07/15/2013 with  Chief Complaint  Patient presents with  . Shortness of Breath  .    Pt today feels better  Meds . acetylcysteine  3 mL Nebulization TID  . albuterol  2.5 mg Nebulization Q6H  . allopurinol  100 mg Oral BID  . amiodarone  100 mg Oral QHS  . atorvastatin  10 mg Oral q1800  . calcium-vitamin D  1 tablet Oral Q breakfast  . cefTAZidime (FORTAZ)  IV  2 g Intravenous Q T,Th,Sa-HD  . cinacalcet  30 mg Oral QODAY  . metoprolol tartrate  12.5 mg Oral BID  . multivitamin  1 tablet Oral Daily  . pantoprazole  40 mg Oral QAC breakfast  . PARoxetine  20 mg Oral Daily  . prasugrel  10 mg Oral Daily  . sevelamer carbonate  2,400 mg Oral TID WC  . vancomycin  1,000 mg Intravenous Q T,Th,Sa-HD     Physical Exam: Vital signs in last 24 hours: Temp:  [98 F (36.7 C)-99.5 F (37.5 C)] 98.7 F (37.1 C) (04/08 0400) Pulse Rate:  [25-145] 120 (04/08 0951) Resp:  [14-29] 18 (04/08 0900) BP: (72-165)/(38-114) 100/65 mmHg (04/08 0951) SpO2:  [88 %-100 %] 90 % (04/08 0900) Weight:  [183 lb 13.8 oz (83.4 kg)-187 lb 13.3 oz (85.2 kg)] 183 lb 13.8 oz (83.4 kg) (04/08 0500) Weight change: -1 lb 12.2 oz (-0.8 kg) Last BM Date: 07/17/13  Intake/Output from previous day: 04/07 0701 - 04/08 0700 In: 868 [P.O.:480; I.V.:138; IV Piggyback:250] Out: 2686  Total I/O In: 33.6 [I.V.:33.6] Out: -    Physical Exam: General- pt is awake,alert, oriented to time place and person Resp- No acute REsp distress, rhonchi+  CVS- S1S2 Irregular in rate and rhythm GIT- BS+, soft, NT, ND EXT- NO LE Edema, Cyanosis Access-AVF + Bruit   Lab Results: CBC  Recent Labs  07/19/13 0350  WBC 4.0  HGB 8.9*  HCT 27.5*  PLT 174    BMET  Recent Labs   07/18/13 0415 07/19/13 0350  NA 140 142  K 5.0 4.3  CL 95* 97  CO2 26 30  GLUCOSE 97 119*  BUN 54* 25*  CREATININE 11.07* 6.53*  CALCIUM 8.7 8.6    MICRO Recent Results (from the past 240 hour(s))  MRSA PCR SCREENING     Status: None   Collection Time    07/15/13  1:51 PM      Result Value Ref Range Status   MRSA by PCR NEGATIVE  NEGATIVE Final   Comment:            The GeneXpert MRSA Assay (FDA     approved for NASAL specimens     only), is one component of a     comprehensive MRSA colonization     surveillance program. It is not     intended to diagnose MRSA     infection nor to guide or     monitor treatment for     MRSA infections.  CULTURE, BLOOD (ROUTINE X 2)     Status: None   Collection Time    07/15/13  2:19 PM      Result Value Ref Range Status   Specimen Description BLOOD RIGHT HAND   Final  Special Requests     Final   Value: BOTTLES DRAWN AEROBIC AND ANAEROBIC 10CC EACH BOTTLE   Culture NO GROWTH 3 DAYS   Final   Report Status PENDING   Incomplete  CULTURE, BLOOD (ROUTINE X 2)     Status: None   Collection Time    07/15/13  2:46 PM      Result Value Ref Range Status   Specimen Description BLOOD RIGHT HAND   Final   Special Requests     Final   Value: BOTTLES DRAWN AEROBIC AND ANAEROBIC 8CC EACH BOTTLE   Culture NO GROWTH 3 DAYS   Final   Report Status PENDING   Incomplete  CULTURE, EXPECTORATED SPUTUM-ASSESSMENT     Status: None   Collection Time    07/17/13  9:30 PM      Result Value Ref Range Status   Specimen Description SPUTUM   Final   Special Requests NONE   Final   Sputum evaluation     Final   Value: THIS SPECIMEN IS ACCEPTABLE. RESPIRATORY CULTURE REPORT TO FOLLOW.   Report Status 07/17/2013 FINAL   Final  CULTURE, RESPIRATORY (NON-EXPECTORATED)     Status: None   Collection Time    07/17/13  9:30 PM      Result Value Ref Range Status   Specimen Description SPUTUM   Final   Special Requests NONE   Final   Gram Stain     Final    Value: RARE WBC PRESENT,BOTH PMN AND MONONUCLEAR     RARE SQUAMOUS EPITHELIAL CELLS PRESENT     NO ORGANISMS SEEN     Performed at [facility]   Culture     Final   Value: NO GROWTH 1 DAY     Performed at [facility]   Report Status PENDING   Incomplete      Lab Results  Component Value Date   CALCIUM 8.6 07/19/2013   CAION 1.07* 07/02/2012   PHOS 4.5 07/18/2013      Impression: 1)Renal ESRD on HD On TTS schedule Pt was last dialyzed yesterday NO need of HD today  2)HTN Target Organ damage  CKD CAD  Medication- On Beta blockers  3)Anemia HGb at goal (9--11)   4)CKD Mineral-Bone Disorder Secondary Hyperparathyroidism present      ON Sensipar Phosphorus at goal.   5)ID-admitted with Pneumonia On ABX Primary MD following  6)Electrolytes  Normokalemic NOrmonatremic   7)Acid base Co2 at goal  8) Afib On Calcium channel; blockers and Amiodarone    Plan:  Will continue current care NO need of Hd today Will dialyze in am      Lenell Mcconnell S Blen Ransome 07/19/2013, 10:06 AM

## 2013-07-19 NOTE — Progress Notes (Signed)
UR chart review completed.  

## 2013-07-20 DIAGNOSIS — E785 Hyperlipidemia, unspecified: Secondary | ICD-10-CM

## 2013-07-20 DIAGNOSIS — I959 Hypotension, unspecified: Secondary | ICD-10-CM

## 2013-07-20 DIAGNOSIS — Z9861 Coronary angioplasty status: Secondary | ICD-10-CM

## 2013-07-20 DIAGNOSIS — I10 Essential (primary) hypertension: Secondary | ICD-10-CM

## 2013-07-20 LAB — RESPIRATORY VIRUS PANEL
Adenovirus: NOT DETECTED
Influenza A H1: NOT DETECTED
Influenza A H3: NOT DETECTED
Influenza A: NOT DETECTED
Influenza B: NOT DETECTED
Metapneumovirus: NOT DETECTED
PARAINFLUENZA 1 A: NOT DETECTED
Parainfluenza 2: NOT DETECTED
Parainfluenza 3: DETECTED — AB
RESPIRATORY SYNCYTIAL VIRUS B: NOT DETECTED
Respiratory Syncytial Virus A: NOT DETECTED
Rhinovirus: NOT DETECTED

## 2013-07-20 LAB — CULTURE, RESPIRATORY W GRAM STAIN: Culture: NO GROWTH

## 2013-07-20 LAB — CULTURE, BLOOD (ROUTINE X 2)
Culture: NO GROWTH
Culture: NO GROWTH

## 2013-07-20 LAB — CULTURE, RESPIRATORY

## 2013-07-20 MED ORDER — EPOETIN ALFA 10000 UNIT/ML IJ SOLN
10000.0000 [IU] | INTRAMUSCULAR | Status: DC
  Administered 2013-07-20: 10000 [IU] via INTRAVENOUS
  Filled 2013-07-20: qty 1

## 2013-07-20 MED ORDER — DILTIAZEM HCL 100 MG IV SOLR
5.0000 mg/h | INTRAVENOUS | Status: DC
  Administered 2013-07-20: 15 mg/h via INTRAVENOUS
  Filled 2013-07-20: qty 100

## 2013-07-20 MED ORDER — DILTIAZEM HCL 30 MG PO TABS
30.0000 mg | ORAL_TABLET | Freq: Three times a day (TID) | ORAL | Status: DC
  Administered 2013-07-20 – 2013-07-21 (×4): 30 mg via ORAL
  Filled 2013-07-20 (×4): qty 1

## 2013-07-20 NOTE — Progress Notes (Signed)
Will treat PT around sleep schedule as PT requested.

## 2013-07-20 NOTE — Progress Notes (Signed)
Subjective: Patient is alert and oriented he still has a 2 fibrillation with rapid ventricular rate and end-stage renal disease. He is having dialysis today. His ventricular rate remains, 110-120. The patient still has some slight cough. Objective: Vital signs in last 24 hours: Temp:  [98.1 F (36.7 C)-98.9 F (37.2 C)] 98.3 F (36.8 C) (04/09 1310) Pulse Rate:  [33-124] 94 (04/09 1700) Resp:  [11-24] 21 (04/09 1700) BP: (57-138)/(24-94) 100/60 mmHg (04/09 1700) SpO2:  [84 %-100 %] 97 % (04/09 1700) Weight:  [84.823 kg (187 lb)] 84.823 kg (187 lb) (04/09 0500) Weight change: -0.377 kg (-13.3 oz) Last BM Date: 07/17/13  Intake/Output from previous day: 04/08 0701 - 04/09 0700 In: 912.1 [P.O.:660; I.V.:252.1] Out: -  Intake/Output this shift: Total I/O In: 485 [P.O.:480; I.V.:5] Out: 2646 [Other:2646]  Physical Exam: Lungs occasional rhonchus heard over lower lung field  Heart rapid irregular rhythm  Abdomen no palpable organs or masses  Extremities free of edema   Recent Labs  07/19/13 0350  WBC 4.0  HGB 8.9*  HCT 27.5*  PLT 174   BMET  Recent Labs  07/18/13 0415 07/19/13 0350  NA 140 142  K 5.0 4.3  CL 95* 97  CO2 26 30  GLUCOSE 97 119*  BUN 54* 25*  CREATININE 11.07* 6.53*  CALCIUM 8.7 8.6    Studies/Results: No results found.  Medications:  . acetylcysteine  3 mL Nebulization TID  . albuterol  2.5 mg Nebulization Q6H  . allopurinol  100 mg Oral BID  . amiodarone  100 mg Oral QHS  . atorvastatin  10 mg Oral q1800  . calcium-vitamin D  1 tablet Oral Q breakfast  . cefTAZidime (FORTAZ)  IV  2 g Intravenous Q T,Th,Sa-HD  . cinacalcet  30 mg Oral QODAY  . diltiazem  30 mg Oral 3 times per day  . epoetin alfa  10,000 Units Intravenous Q T,Th,Sa-HD  . metoprolol tartrate  12.5 mg Oral BID  . multivitamin  1 tablet Oral Daily  . pantoprazole  40 mg Oral QAC breakfast  . PARoxetine  20 mg Oral Daily  . prasugrel  10 mg Oral Daily  . sevelamer  carbonate  2,400 mg Oral TID WC  . vancomycin  1,000 mg Intravenous Q T,Th,Sa-HD    . diltiazem (CARDIZEM) infusion 15 mg/hr (07/20/13 1731)     Assessment/Plan: 1. Pneumonia plan to continue IV Fortaz and vancomycin  2. End-stage renal disease the patient will be dialyzed today   3. Aortic stenosis coronary artery disease patient stable  4. A 2 fibrillation with rapid ventricular response plan to continue current regimen will transition to Cardizem 30 mg 3 times a day   LOS: 5 days   Robyn Nohr G Kalley Nicholl 07/20/2013, 5:45 PM

## 2013-07-20 NOTE — Progress Notes (Signed)
Subjective: Interval History: has no complaint of nausea or vomiting. His breathing is much better. Patient also states that he does not have any more cough..  Objective: Vital signs in last 24 hours: Temp:  [98.1 F (36.7 C)-98.9 F (37.2 C)] 98.5 F (36.9 C) (04/09 0400) Pulse Rate:  [33-124] 105 (04/09 0800) Resp:  [11-24] 21 (04/09 0800) BP: (57-129)/(24-84) 126/83 mmHg (04/09 0800) SpO2:  [84 %-100 %] 97 % (04/09 0835) Weight:  [84.823 kg (187 lb)] 84.823 kg (187 lb) (04/09 0500) Weight change: -0.377 kg (-13.3 oz)  Intake/Output from previous day: 04/08 0701 - 04/09 0700 In: 912.1 [P.O.:660; I.V.:252.1] Out: -  Intake/Output this shift: Total I/O In: 5 [I.V.:5] Out: -   General appearance: alert, cooperative and no distress Resp: rales posterior - bilateral Cardio: regular rate and rhythm, S1, S2 normal, no murmur, click, rub or gallop GI: soft, non-tender; bowel sounds normal; no masses,  no organomegaly Extremities: extremities normal, atraumatic, no cyanosis or edema  Lab Results:  Recent Labs  07/19/13 0350  WBC 4.0  HGB 8.9*  HCT 27.5*  PLT 174   BMET:   Recent Labs  07/18/13 0415 07/19/13 0350  NA 140 142  K 5.0 4.3  CL 95* 97  CO2 26 30  GLUCOSE 97 119*  BUN 54* 25*  CREATININE 11.07* 6.53*  CALCIUM 8.7 8.6   No results found for this basename: PTH,  in the last 72 hours Iron Studies: No results found for this basename: IRON, TIBC, TRANSFERRIN, FERRITIN,  in the last 72 hours  Studies/Results: No results found.  I have reviewed the patient's current medications.  Assessment/Plan: Problem #1 difficulty in breathing: . Presently patient seems to be feeling much better. Problem #2 end-stage renal disease is status post hemodialysis on Tesday. His potassium is normal and presently patient does not have any uremic sign and symptoms. Patient is due for dialysis today. Problem #3 pneumonia: He is on antibiotics. Patient presently afebrile and  his white blood cell count is normal.  Problem #4 anemia: His hemoglobin has declined . Problem #5 hypertension: His blood pressure is reasonably controlled. Problem #6 metabolic bone disease: His calcium and phosphorus isn't range. Problem #7 coronary disease presently is asymptomatic. Plan: We'll make arrangements for patient to get dialysis today which is his regular schedule. His next dialysis will be on Saturday If patient is going to be discharged he'll be followed by his primary care nephrologist as outpatient. We'll check his CBC and basic metabolic panel in the morning. Start on Epogen 10,000 unit iv after each dialysis   LOS: 5 days   Jolynne Spurgin S Amaris Garrette 07/20/2013,8:38 AM

## 2013-07-20 NOTE — Progress Notes (Signed)
Patient's heart rate still 110-120 s. Cardizem gtt @15  mg/hr. First dose of po cardizem given after dialysis today. Consulted with Dr Purvis SheffieldKoneswaran by phone regarding rate control, and he asked that we continue the current course of treatment, allow pt to get more doses of po cardizem, and reassess in am.

## 2013-07-20 NOTE — Progress Notes (Signed)
SUBJECTIVE: Pt says, "I feel great". Denies chest pain and palpitations. Says breathing has improved.     Intake/Output Summary (Last 24 hours) at 07/20/13 0842 Last data filed at 07/20/13 0800  Gross per 24 hour  Intake 890.33 ml  Output      0 ml  Net 890.33 ml    Current Facility-Administered Medications  Medication Dose Route Frequency Provider Last Rate Last Dose  . 0.9 %  sodium chloride infusion  100 mL Intravenous PRN Jamse MeadBelayenh S Befekadu, MD      . 0.9 %  sodium chloride infusion  100 mL Intravenous PRN Jamse MeadBelayenh S Befekadu, MD      . acetaminophen (TYLENOL) tablet 650 mg  650 mg Oral Q4H PRN Dorothea OgleIskra M Myers, MD   650 mg at 07/15/13 1826  . acetylcysteine (MUCOMYST) 20 % nebulizer / oral solution 3 mL  3 mL Nebulization TID Alice ReichertAngus G McInnis, MD   3 mL at 07/20/13 0834  . albuterol (PROVENTIL) (2.5 MG/3ML) 0.083% nebulizer solution 2.5 mg  2.5 mg Nebulization Q2H PRN Angus G McInnis, MD      . albuterol (PROVENTIL) (2.5 MG/3ML) 0.083% nebulizer solution 2.5 mg  2.5 mg Nebulization Q6H Angus G McInnis, MD   2.5 mg at 07/20/13 0832  . allopurinol (ZYLOPRIM) tablet 100 mg  100 mg Oral BID Dorothea OgleIskra M Myers, MD   100 mg at 07/19/13 2122  . amiodarone (PACERONE) tablet 100 mg  100 mg Oral QHS Dorothea OgleIskra M Myers, MD   100 mg at 07/19/13 2122  . atorvastatin (LIPITOR) tablet 10 mg  10 mg Oral q1800 Dorothea OgleIskra M Myers, MD   10 mg at 07/19/13 1801  . calcium-vitamin D (OSCAL WITH D) 500-200 MG-UNIT per tablet 1 tablet  1 tablet Oral Q breakfast Dorothea OgleIskra M Myers, MD   1 tablet at 07/20/13 0755  . cefTAZidime (FORTAZ) 2 g in dextrose 5 % 50 mL IVPB  2 g Intravenous Q T,Th,Sa-HD [facility]iding[facility]rea Michelle Lilliston, RPH   2 g at 07/18/13 1430  . cinacalcet (SENSIPAR) tablet 30 mg  30 mg Oral QODAY Dorothea OgleIskra M Myers, MD   30 mg at 07/18/13 1058  . diltiazem (CARDIZEM) 100 mg in dextrose 5 % 100 mL infusion  5-15 mg/hr Intravenous Titrated Angus G McInnis, MD 5 mL/hr at 07/20/13 0800 5 mg/hr at 07/20/13 0800  . feeding  supplement (NEPRO CARB STEADY) liquid 237 mL  237 mL Oral PRN Jamse MeadBelayenh S Befekadu, MD      . heparin injection 1,000 Units  1,000 Units Dialysis PRN Jamse MeadBelayenh S Befekadu, MD      . HYDROmorphone (DILAUDID) injection 1 mg  1 mg Intravenous Q3H PRN Dorothea OgleIskra M Myers, MD   1 mg at 07/19/13 1801  . lidocaine (PF) (XYLOCAINE) 1 % injection 5 mL  5 mL Intradermal PRN Jamse MeadBelayenh S Befekadu, MD      . lidocaine-prilocaine (EMLA) cream 1 application  1 application Topical PRN Jamse MeadBelayenh S Befekadu, MD      . metoprolol tartrate (LOPRESSOR) tablet 12.5 mg  12.5 mg Oral BID Dorothea OgleIskra M Myers, MD   12.5 mg at 07/19/13 2123  . multivitamin (RENA-VIT) tablet 1 tablet  1 tablet Oral Daily Dorothea OgleIskra M Myers, MD   1 tablet at 07/19/13 [phone]  . nitroGLYCERIN (NITROSTAT) SL tablet 0.4 mg  0.4 mg Sublingual Q5 min PRN Dorothea OgleIskra M Myers, MD      . ondansetron [facility](ZOFRAN) injection 4 mg  4 mg Intravenous Q4H PRN Sherlon HandingIskra  Aurther Loft, MD   4 mg at 07/15/13 1852  . pantoprazole (PROTONIX) EC tablet 40 mg  40 mg Oral QAC breakfast Dorothea Ogle, MD   40 mg at 07/20/13 0755  . PARoxetine (PAXIL) tablet 20 mg  20 mg Oral Daily Dorothea Ogle, MD   20 mg at 07/19/13 0951  . pentafluoroprop-tetrafluoroeth (GEBAUERS) aerosol 1 application  1 application Topical PRN Jamse Mead, MD      . prasugrel (EFFIENT) tablet 10 mg  10 mg Oral Daily Dorothea Ogle, MD   10 mg at 07/19/13 0951  . sevelamer carbonate (RENVELA) tablet 2,400 mg  2,400 mg Oral TID WC Dorothea Ogle, MD   2,400 mg at 07/20/13 0756  . vancomycin (VANCOCIN) IVPB 1000 mg/200 mL premix  1,000 mg Intravenous Q T,Th,Sa-HD [facility] L[facility], RPH   1,000 mg at 07/18/13 1206   Facility-Administered Medications Ordered in Other Encounters  Medication Dose Route Frequency Provider Last Rate Last Dose  . 0.9 %  sodium chloride infusion  250 mL Intravenous Once [name], MD      . 0.9 %  sodium chloride infusion  250 mL Intravenous Once [name], MD      . heparin lock flush 100  unit/mL  500 Units Intracatheter Daily PRN [name], MD      . heparin lock flush 100 unit/mL  250 Units Intracatheter PRN [name], MD      . heparin lock flush 100 unit/mL  500 Units Intracatheter Daily PRN [name], MD      . heparin lock flush 100 unit/mL  250 Units Intracatheter PRN [name], MD      . sodium chloride 0.9 % injection 10 mL  10 mL Intracatheter PRN [name], MD      . sodium chloride 0.9 % injection 10 mL  10 mL Intracatheter PRN [name], MD      . sodium chloride 0.9 % injection 3 mL  3 mL Intracatheter PRN [name], MD      . sodium chloride 0.9 % injection 3 mL  3 mL Intracatheter PRN [name], MD        Filed Vitals:   07/20/13 0630 07/20/13 0700 07/20/13 0800 07/20/13 0835  BP: 104/58 70/43 126/83   Pulse: 86 88 105   Temp:      TempSrc:      Resp: 18 16 21    Height:      Weight:      SpO2: 95% 98% 96% 97%    PHYSICAL EXAM General: NAD Neck: No JVD, no thyromegaly.  Lungs: Scattered rhonchi and wheezes b/l CV: Nondisplaced PMI.  Irregular rhythm, normal S1/S2, no S3, III/VI harsh, ejection systolic murmur at RUSB.  No pretibial edema.  No carotid bruit.  Normal pedal pulses.  Abdomen: Soft, nontender, no hepatosplenomegaly, no distention.  Neurologic: Alert and oriented x 3.  Psych: Normal affect. Extremities: No clubbing or cyanosis.   TELEMETRY: Reviewed telemetry pt in A Fib (80-100 bpm)  LABS: Basic Metabolic Panel:  Recent Labs  96/04/54 0415 07/19/13 0350  NA 140 142  K 5.0 4.3  CL 95* 97  CO2 26 30  GLUCOSE 97 119*  BUN 54* 25*  CREATININE 11.07* 6.53*  CALCIUM 8.7 8.6  PHOS 4.5  --    Liver Function Tests: No results found for this basename: AST, ALT, ALKPHOS, BILITOT, PROT, ALBUMIN,  in the  last 72 hours No results found for this basename: LIPASE, AMYLASE,  in the last 72 hours CBC:  Recent Labs  07/19/13 0350  WBC 4.0  HGB 8.9*  HCT 27.5*  MCV 115.1*  PLT 174   Cardiac  Enzymes: No results found for this basename: CKTOTAL, CKMB, CKMBINDEX, TROPONINI,  in the last 72 hours BNP: No components found with this basename: POCBNP,  D-Dimer: No results found for this basename: DDIMER,  in the last 72 hours Hemoglobin A1C: No results found for this basename: HGBA1C,  in the last 72 hours Fasting Lipid Panel: No results found for this basename: CHOL, HDL, LDLCALC, TRIG, CHOLHDL, LDLDIRECT,  in the last 72 hours Thyroid Function Tests: No results found for this basename: TSH, T4TOTAL, FREET3, T3FREE, THYROIDAB,  in the last 72 hours Anemia Panel: No results found for this basename: VITAMINB12, FOLATE, FERRITIN, TIBC, IRON, RETICCTPCT,  in the last 72 hours  RADIOLOGY: Dg Chest 2 View  07/15/2013   CLINICAL DATA:  Shortness of breath.  EXAM: CHEST  2 VIEW  COMPARISON:  DG CHEST 1V PORT dated 03/13/2012; DG CHEST 1V PORT dated 10/14/2011; DG CHEST 2 VIEW dated 10/12/2011  FINDINGS: The heart size and mediastinal contours are stable with aortic atherosclerosis. The lungs are clear. There is no pleural effusion or pneumothorax. Old fracture of the right seventh rib appears stable. No acute osseous findings are evident. Telemetry leads overlie the chest.  IMPRESSION: Stable chest.  No acute cardiopulmonary process.   Electronically Signed   By: Roxy Horseman M.D.   On: 07/15/2013 10:57   Ct Chest Wo Contrast  07/16/2013   CLINICAL DATA:  Chest pain  EXAM: CT CHEST WITHOUT CONTRAST  TECHNIQUE: Multidetector CT imaging of the chest was performed following the standard protocol without IV contrast.  COMPARISON:  Plain film from the previous day  FINDINGS: The lungs are well aerated bilaterally. This some very mild patchy ground-glass infiltrate is noted predominately within the left lung. Some changes are noted in the right upper lobe which demonstrates some mild nodularity and tree in bud appearance. No focal confluent infiltrate is seen. Multiple calcified lymph nodes are identified  particularly in the right hilum. Scattered mediastinal lymph nodes are seen. The largest of these lies in the right paratracheal region and measures 17 mm in short axis. Heavy coronary calcifications are seen. The upper abdomen shows multiple calcified splenic granulomas. No acute bony abnormality is seen.  IMPRESSION: Changes of prior granulomatous disease.  Multiple noncalcified with nodes within the mediastinum. Additionally knee patchy ground-glass infiltrate and tree in bud changes are noted bilaterally. Changes are most consistent with an infectious in etiology correlation with sputum cultures is recommended.   Electronically Signed   By: Alcide Clever M.D.   On: 07/16/2013 10:32      ASSESSMENT AND PLAN: 1. Atrial fibrillation with RVR: rate better controlled with diltiazem drip down to 5 mg/hr. On metoprolol 12.5 mg bid and amiodarone 100 mg daily. Will start oral diltiazem 30 mg tid and attempt to wean off drip. BP more stable. Reportedly not on anticoagulation due to h/o GI bleeds. Hgb pending for today. 8.9 on 4/8. Rapid atrial fibrillation being driven by pneumonia. 2. Valvular heart disease: mild to moderate aortic regurgitation and moderate aortic stenosis in 02/2013. Stable. 3. CAD with prior PCI: asymptomatic. 4. ESRD: on hemodialysis. 5. Anemia: Hgb 8.9 on 4/8.   Prentice Docker, M.D., F.A.C.C.

## 2013-07-20 NOTE — Procedures (Signed)
   HEMODIALYSIS TREATMENT NOTE:  4 hour heparin-free dialysis completed via left upper arm AVF (15g ante/retrograde).  Goal NOT met:  BP unable to tolerate prescribed goal of 3 liters.  Ultrafiltration was interrupted x 15 minutes for SBP<90 (asymptomatic).  Atrial fibrillation with HR 60-120. All blood was reinfused.  Hemostasis was achieved within 10 minutes.  Report given to Jadene Pierini, RN.  Trachelle Low L. Marvia Troost, RN, CDN

## 2013-07-20 NOTE — Progress Notes (Signed)
Nutrition Brief Note  RD pulled to chart due to LOS  Wt Readings from Last 15 Encounters:  07/20/13 187 lb (84.823 kg)  05/19/13 184 lb (83.462 kg)  11/09/12 175 lb (79.379 kg)  07/26/12 170 lb (77.111 kg)  07/19/12 170 lb (77.111 kg)  07/08/12 179 lb 14.3 oz (81.6 kg)  07/08/12 179 lb 14.3 oz (81.6 kg)  07/08/12 179 lb 14.3 oz (81.6 kg)  04/01/12 174 lb 11.2 oz (79.243 kg)  03/17/12 175 lb 4.3 oz (79.5 kg)  03/17/12 175 lb 4.3 oz (79.5 kg)  03/17/12 175 lb 4.3 oz (79.5 kg)  11/13/11 164 lb (74.39 kg)  11/04/11 165 lb (74.844 kg)  10/15/11 165 lb 5.5 oz (75 kg)    Body mass index is 27.6 kg/(m^2). Patient meets criteria for overweight based on current BMI.   Current diet order is regular, patient is consuming approximately 100% of meals at this time. Labs and medications reviewed.   No nutrition interventions warranted at this time. If nutrition issues arise, please consult RD.   Terrace Fontanilla A. Mayford KnifeWilliams, RD, LDN Pager: [phone]

## 2013-07-21 DIAGNOSIS — I498 Other specified cardiac arrhythmias: Secondary | ICD-10-CM

## 2013-07-21 DIAGNOSIS — I739 Peripheral vascular disease, unspecified: Secondary | ICD-10-CM

## 2013-07-21 MED ORDER — DILTIAZEM HCL 30 MG PO TABS: 30.0000 mg | ORAL_TABLET | Freq: Three times a day (TID) | ORAL | Status: DC

## 2013-07-21 NOTE — Progress Notes (Signed)
SUBJECTIVE: Pt offers no complaints. BP currently 97/59, HR 80's at rest. Diltiazem drip at 2.5 mg/hr.    No Known Allergies  Current Facility-Administered Medications  Medication Dose Route Frequency Provider Last Rate Last Dose  . 0.9 %  sodium chloride infusion  100 mL Intravenous PRN Jamse Mead, MD      . 0.9 %  sodium chloride infusion  100 mL Intravenous PRN Jamse Mead, MD      . acetaminophen (TYLENOL) tablet 650 mg  650 mg Oral Q4H PRN Dorothea Ogle, MD   650 mg at 07/15/13 1826  . albuterol (PROVENTIL) (2.5 MG/3ML) 0.083% nebulizer solution 2.5 mg  2.5 mg Nebulization Q2H PRN Angus G McInnis, MD      . albuterol (PROVENTIL) (2.5 MG/3ML) 0.083% nebulizer solution 2.5 mg  2.5 mg Nebulization Q6H Angus Edilia Bo, MD   2.5 mg at 07/21/13 0824  . allopurinol (ZYLOPRIM) tablet 100 mg  100 mg Oral BID Dorothea Ogle, MD   100 mg at 07/21/13 0900  . amiodarone (PACERONE) tablet 100 mg  100 mg Oral QHS Dorothea Ogle, MD   100 mg at 07/20/13 2151  . atorvastatin (LIPITOR) tablet 10 mg  10 mg Oral q1800 Dorothea Ogle, MD   10 mg at 07/20/13 1733  . calcium-vitamin D (OSCAL WITH D) 500-200 MG-UNIT per tablet 1 tablet  1 tablet Oral Q breakfast Dorothea Ogle, MD   1 tablet at 07/21/13 [phone]  . cefTAZidime (FORTAZ) 2 g in dextrose 5 % 50 mL IVPB  2 g Intravenous Q T,Th,Sa-HD [facility] L[facility], RPH   2 g at 07/20/13 1225  . cinacalcet (SENSIPAR) tablet 30 mg  30 mg Oral QODAY Dorothea Ogle, MD   30 mg at 07/20/13 1423  . diltiazem (CARDIZEM) 100 mg in dextrose 5 % 100 mL infusion  5-15 mg/hr Intravenous Titrated Laqueta Linden, MD 2.5 mL/hr at 07/21/13 0600 2.5 mg/hr at 07/21/13 0600  . diltiazem (CARDIZEM) tablet 30 mg  30 mg Oral 3 times per day Laqueta Linden, MD   30 mg at 07/21/13 0540  . epoetin alfa (EPOGEN,PROCRIT) injection 10,000 Units  10,000 Units Intravenous Q T,Th,Sa-HD Jamse Mead, MD   10,000 Units at 07/20/13 1125  . feeding  supplement (NEPRO CARB STEADY) liquid 237 mL  237 mL Oral PRN Jamse Mead, MD      . heparin injection 1,000 Units  1,000 Units Dialysis PRN Jamse Mead, MD      . HYDROmorphone (DILAUDID) injection 1 mg  1 mg Intravenous Q3H PRN Dorothea Ogle, MD   1 mg at 07/20/13 1910  . lidocaine (PF) (XYLOCAINE) 1 % injection 5 mL  5 mL Intradermal PRN Jamse Mead, MD      . lidocaine-prilocaine (EMLA) cream 1 application  1 application Topical PRN Jamse Mead, MD      . metoprolol tartrate (LOPRESSOR) tablet 12.5 mg  12.5 mg Oral BID Dorothea Ogle, MD   12.5 mg at 07/21/13 0900  . multivitamin (RENA-VIT) tablet 1 tablet  1 tablet Oral Daily Dorothea Ogle, MD   1 tablet at 07/21/13 0900  . nitroGLYCERIN (NITROSTAT) SL tablet 0.4 mg  0.4 mg Sublingual Q5 min PRN Dorothea Ogle, MD      . ondansetron [facility]) injection 4 mg  4 mg Intravenous Q4H PRN Dorothea Ogle, MD   4 mg at 07/15/13 1852  .  pantoprazole (PROTONIX) EC tablet 40 mg  40 mg Oral QAC breakfast Dorothea OgleIskra M Myers, MD   40 mg at 07/21/13 [id number]  . PARoxetine (PAXIL) tablet 20 mg  20 mg Oral Daily Dorothea OgleIskra M Myers, MD   20 mg at 07/21/13 0900  . pentafluoroprop-tetrafluoroeth (GEBAUERS) aerosol 1 application  1 application Topical PRN Jamse MeadBelayenh S Befekadu, MD      . prasugrel (EFFIENT) tablet 10 mg  10 mg Oral Daily Dorothea OgleIskra M Myers, MD   10 mg at 07/21/13 0900  . sevelamer carbonate (RENVELA) tablet 2,400 mg  2,400 mg Oral TID WC Dorothea OgleIskra M Myers, MD   2,400 mg at 07/21/13 [id number]  . vancomycin (VANCOCIN) IVPB 1000 mg/200 mL premix  1,000 mg Intravenous Q T,Th,Sa-HD [facility] Michelle Lilliston, RPH   1,000 mg at 07/20/13 1130   Facility-Administered Medications Ordered in Other Encounters  Medication Dose Route Frequency Provider Last Rate Last Dose  . 0.9 %  sodium chloride infusion  250 mL Intravenous Once Lauris PoagAlvin C Powell, MD      . 0.9 %  sodium chloride infusion  250 mL Intravenous Once Lauris PoagAlvin C Powell, MD      . heparin lock flush 100  unit/mL  500 Units Intracatheter Daily PRN Lauris PoagAlvin C Powell, MD      . heparin lock flush 100 unit/mL  250 Units Intracatheter PRN Lauris PoagAlvin C Powell, MD      . heparin lock flush 100 unit/mL  500 Units Intracatheter Daily PRN Lauris PoagAlvin C Powell, MD      . heparin lock flush 100 unit/mL  250 Units Intracatheter PRN Lauris PoagAlvin C Powell, MD      . sodium chloride 0.9 % injection 10 mL  10 mL Intracatheter PRN Lauris PoagAlvin C Powell, MD      . sodium chloride 0.9 % injection 10 mL  10 mL Intracatheter PRN Lauris PoagAlvin C Powell, MD      . sodium chloride 0.9 % injection 3 mL  3 mL Intracatheter PRN Lauris PoagAlvin C Powell, MD      . sodium chloride 0.9 % injection 3 mL  3 mL Intracatheter PRN Lauris PoagAlvin C Powell, MD        Past Medical History  Diagnosis Date  . HTN (hypertension)   . Arthritis   . Hypercholesteremia   . MI (myocardial infarction) 03/16/2011    Inferolateral ST segment elevation MI  . GERD (gastroesophageal reflux disease)   . Atrial fibrillation     Paroxysmal - 2 episodes related to ischemic NSTEMI events  5 & 09/2011  . Gout   . S/P coronary artery stent placement, 10/01/11 with DES to mid LAD and  PTCA of Diag 2 10/01/2009    Inferior STEMI 2011 - BMS to LCx; NSTEMI 5/13 with ISR/thrombosis of Cx BMS -->multiple DES to  LCx ; staged PCI of LAD /Balloon PTCA of major Diag 09/2011 for Sx (NSTEMI)   . Anemia   . Schatzki's ring   . Angiodysplasia of intestine   . Aortic stenosis, moderate     2D Echocardiogram 12/13 and 08/22/11 showed moderate aortic stenosis with valve area of 1.28 sq cm. EF of 45 to 50% with severe hypokinesia of the entire inferolateral myocardium  . ESRD on hemodialysis     [facility], TTS, esrd due to HTN  . Right renal artery stenosis 1998  . Ulcer     Past Surgical History  Procedure Laterality Date  . Ureteral stent placement    . Colonoscopy  04/2009  with EGD by Dr.Buccini moderately large hiatal hernia could contribute to his anemia,Schatzki ring which might accout for his  intermittent dysphagia/+internal hemorrhoids ;family hx of colon ca  . Esophagogastroduodenoscopy  03/2010    by Dr.Magod/small hiatal hernia with widley patent fibrous ring,minimal bulbitis otherwise nl  . Dialysis fistula creation    . Coronary angioplasty with stent placement  08/2011 & 09/2011    Integrity Resolute DES Stents  Stent #1:  2.22mm x 14 mm (extending to just distal to the bifurcation to the mid OM, Stent #2:  2.46mm x 18mm  Stent #3:  3.50mm x 18mm; covering proximal portion of the old stent into the proximal circumflex and overlapping stent #2 distally, Stent #4: 2.51mm x 8mm (4 overlapping in LCx- OM1; 1 in mid LAD; PTCA only of Major Diag (small caliber)  . Coronary angioplasty with stent placement  2011, 2012    Dr [name]  03/16/11  re-cannulized totally occluded circumflex, stented with Integrity bare metal stent focal 90% stenosis in LAD and diagonal branch   . Givens capsule study  04/16/10    SB erosions vs. AVMs, non-specific distal SB inflammation in setting ASA, plavix, NSAIDs  . Esophagogastroduodenoscopy  03/14/2012    Procedure: ESOPHAGOGASTRODUODENOSCOPY (EGD);  Surgeon: Iva Boop, MD;  Location: [facility] ENDOSCOPY;  Service: Endoscopy;  Laterality: N/A;  . Colonoscopy  03/16/2012    Procedure: COLONOSCOPY;  Surgeon: Iva Boop, MD;  Location: [facility] ENDOSCOPY;  Service: Endoscopy;  Laterality: N/A;  . Right renal artery, pta and stenting  08/22/1996    Dr [name], P-154 Palmaz stent mounted on a 6mm x 2cm PowerFlex and deployed in the renal ostium at 7 atmospheres  reduction of 95% lesion to 0% residual without dissection    . Enteroscopy N/A 07/06/2012    Procedure: ENTEROSCOPY;  Surgeon: Hilarie Fredrickson, MD;  Location: [facility] ENDOSCOPY;  Service: Endoscopy;  Laterality: N/A;  . Doppler echocardiography  03/15/2012-[facility]    LV 45-50%  . Nm myocar perf wall motion  05/02/2010    EF49% ; mod perfusion basal inferolateral, basal anterolateral,mid inferolat and  mid anterolateral region  . Carotid doppler  04/20/2011    lright common iliac artery0-49%,left common iliac artery 0-49%  . Renal angigram  01/02/2004    C02 angiography-wideely patent renal arteries  . Event monitor  09/02/2011-6/20/2/13    reason afib    History   Social History  . Marital Status: Married    Spouse Name: N/A    Number of Children: 3  . Years of Education: N/A   Occupational History  . retired    Social History Main Topics  . Smoking status: Former Smoker -- 0.50 packs/day for 45 years    Types: Cigarettes    Quit date: 04/13/1992  . Smokeless tobacco: Current User    Types: Chew  . Alcohol Use: No  . Drug Use: No  . Sexual Activity: No   Other Topics Concern  . 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      Filed Vitals:   07/21/13 0645 07/21/13 0700 07/21/13 0730 07/21/13 0824  BP: 104/72 104/60    Pulse:  76    Temp:   98.1 F (36.7 C)   TempSrc:   Oral   Resp: 20 18    Height:      Weight:      SpO2: 100% 94%  97%  PHYSICAL EXAM General: NAD  Neck: No JVD, no thyromegaly.  Lungs: Scattered rhonchi and wheezes b/l  CV: Nondisplaced PMI. Irregular rhythm, normal S1/S2, no S3, III/VI harsh, ejection systolic murmur at RUSB. No pretibial edema. No carotid bruit. Normal pedal pulses.  Abdomen: Soft, nontender, no hepatosplenomegaly, no distention.  Neurologic: Alert and oriented x 3.  Psych: Normal affect.  Extremities: No clubbing or cyanosis.   TELEMETRY: Reviewed telemetry pt in A Fib (80-100 bpm), occasional PVC's, 1.8 second pause at 7:10 am (asymptomatic).   ECG: reviewed and available in electronic records.      ASSESSMENT AND PLAN: 1. Atrial fibrillation with RVR: Rate better controlled with the addition of oral diltiazem. Diltiazem drip down to 2.5 mg/hr. On metoprolol 12.5 mg bid and amiodarone 100 mg daily. Given soft BP, will not increase this dose. Reportedly  not on anticoagulation due to h/o GI bleeds, albeit on Effient. Hgb pending for today. 8.9 on 4/8. Rapid atrial fibrillation being driven by pneumonia, and will likely become easier to control with pneumonia resolution.  2. Valvular heart disease: mild to moderate aortic regurgitation and moderate aortic stenosis in 02/2013. Stable.  3. CAD with prior PCI: Asymptomatic. On beta blocker, statin, and prasugrel. 4. ESRD: on hemodialysis.  5. Anemia: Hgb 8.9 on 4/8.    Prentice Docker, M.D., F.A.C.C.

## 2013-07-21 NOTE — Progress Notes (Signed)
Subjective: Interval History: Patient offer no complaint.  His breathing is much better.   Objective: Vital signs in last 24 hours: Temp:  [97.9 F (36.6 C)-98.3 F (36.8 C)] 98 F (36.7 C) (04/10 0400) Pulse Rate:  [26-149] 76 (04/10 0700) Resp:  [9-38] 18 (04/10 0700) BP: (54-149)/(38-120) 104/60 mmHg (04/10 0700) SpO2:  [84 %-100 %] 94 % (04/10 0700) Weight:  [82.8 kg (182 lb 8.7 oz)] 82.8 kg (182 lb 8.7 oz) (04/10 0500) Weight change: -2.023 kg (-4 lb 7.4 oz)  Intake/Output from previous day: 04/09 0701 - 04/10 0700 In: 1306.1 [P.O.:930; I.V.:126.1; IV Piggyback:250] Out: 2646  Intake/Output this shift:    Generally he is alert and in no apparent distress. Chest: Has bilateral inspiratory crackles.             Patient also with some respiratory wheezing. His heart exam revealed regular rate and rhythm no murmur S3 Abdomen full nontender Extremities no edema.  Lab Results:  Recent Labs  07/19/13 0350  WBC 4.0  HGB 8.9*  HCT 27.5*  PLT 174   BMET:   Recent Labs  07/19/13 0350  NA 142  K 4.3  CL 97  CO2 30  GLUCOSE 119*  BUN 25*  CREATININE 6.53*  CALCIUM 8.6   No results found for this basename: PTH,  in the last 72 hours Iron Studies: No results found for this basename: IRON, TIBC, TRANSFERRIN, FERRITIN,  in the last 72 hours  Studies/Results: No results found.  I have reviewed the patient's current medications.  Assessment/Plan: Problem #1 difficulty in breathing: . Possibly combination of CHF and also COPD. Patient claims to have been feeling better the last couple of days. Problem #2 end-stage renal disease is status post hemodialysis yesterday. Presently he doesn't have any uremic sign and symptoms. His potassium is normal. Problem #3 pneumonia: He is on antibiotics. Patient presently afebrile and his white blood cell count is normal.  Problem #4 anemia: His hemoglobin he is on Epogen. Problem #5 hypertension: His blood pressure is reasonably  controlled. Problem #6 metabolic bone disease: His calcium and phosphorus is in range. Problem #7 coronary disease presently is asymptomatic. Plan: We'll make arrangements for patient to get dialysis tomorrow. If patient is going to be discharged he'll be followed by his primary care nephrologist as outpatient. We'll check his CBC and basic metabolic panel in the morning. We'll continue his Epogen.   LOS: 6 days   Marsela Kuan S Timesha Cervantez 07/21/2013,7:41 AM

## 2013-07-21 NOTE — Progress Notes (Signed)
D.c instructions reviewed with patient and wife.  Verbalized understanding.  Pt dc'd to home with wife. Candelaria StagersLeigh Anne Schonewitz 07/21/2013

## 2013-07-21 NOTE — Discharge Summary (Signed)
Physician Discharge Summary  Roger [name] ALP:[id number] DOB: Jan 05, 1941 DOA: 07/15/2013  PCP: Lanette Hampshire, MD  Admit date: 07/15/2013 Discharge date: 07/21/2013     Discharge Diagnoses:  1. Pneumonia bronchitis 2. End-stage renal disease 3. Essential hypertension 4. Coronary artery disease 5. Atrial fibrillation with rapid ventricular response 6. Aortic stenosis, aortic regurgitation 7. Anemia of chronic disease  Discharge Condition: Improved condition Disposition: Home  Diet recommendation: Low-sodium renal diet  Filed Weights   07/19/13 0500 07/20/13 0500 07/21/13 0500  Weight: 83.4 kg (183 lb 13.8 oz) 84.823 kg (187 lb) 82.8 kg (182 lb 8.7 oz)    History of present illness:  73 year old male patient admitted through the ED after having presented there with increased dyspnea which is been present for several days. He was noted in the ED to have hypotension and felt to be in acute respiratory failure. He was admitted to step down unit because of his hypotension. Patient does have end-stage renal disease and is on dialysis Hospital Course:  The patient was started on nasal O2 he also was started intravenous fluids IV Fortaz and IV vancomycin. He remained on this regimen throughout his hospital stay. He also was continued on drugs listed below. Because of his renal failure the patient was seen by nephrology and he had dialysis on 2 occasions during his hospital stay. While in dialysis the patient developed atrial fibrillation with rapid ventricular response for this he was treated with amiodarone and beta blocker and subsequently IV diltiazem. The patient's heart rate did improve and he was able to be transitioned to by mouth Cardizem 30 mg 3 times a day his creatinine ranged from 4.16-11.0 during his hospital stay. The patient's respiratory problems did improve and was felt that after 9 days of hospitalization and could be discharged. He'll be dialyzed again tomorrow at dialysis  center. The patient will be discharged on Levaquin 250 mg daily for one week.   Discharge Instructions The patient is to return to dialysis center on Saturday for dialysis and will be seen by nephrologist and primary care physician as outpatient. He is to continue the following medications.    Medication List         allopurinol 100 MG tablet  Commonly known as:  ZYLOPRIM  Take 100 mg by mouth 2 (two) times daily.     amiodarone 200 MG tablet  Commonly known as:  PACERONE  Take 100 mg by mouth at bedtime.     calcium-vitamin D 500-200 MG-UNIT per tablet  Commonly known as:  OSCAL WITH D  Take 1 tablet by mouth daily with breakfast.     cinacalcet 30 MG tablet  Commonly known as:  SENSIPAR  Take 30 mg by mouth every other day.     diltiazem 30 MG tablet  Commonly known as:  CARDIZEM  Take 1 tablet (30 mg total) by mouth 3 (three) times daily.     isosorbide mononitrate 30 MG 24 hr tablet  Commonly known as:  IMDUR  Take 1 tablet (30 mg total) by mouth at bedtime.     metoprolol tartrate 25 MG tablet  Commonly known as:  LOPRESSOR  Take 0.5 tablets (12.5 mg total) by mouth 2 (two) times daily.     multivitamin Tabs tablet  Take 1 tablet by mouth every morning.     nitroGLYCERIN 0.4 MG SL tablet  Commonly known as:  NITROSTAT  Place 1 tablet (0.4 mg total) under the tongue every 5 (five) minutes as needed.  pantoprazole 40 MG tablet  Commonly known as:  PROTONIX  Take 1 tablet (40 mg total) by mouth daily before breakfast.     PARoxetine 20 MG tablet  Commonly known as:  PAXIL  Take 20 mg by mouth daily.     prasugrel 10 MG Tabs tablet  Commonly known as:  EFFIENT  Take 1 tablet (10 mg total) by mouth daily.     rosuvastatin 10 MG tablet  Commonly known as:  CRESTOR  Take 10 mg by mouth at bedtime.     sevelamer carbonate 800 MG tablet  Commonly known as:  RENVELA  Take 2,400 mg by mouth 3 (three) times daily with meals.       No Known  Allergies  The results of significant diagnostics from this hospitalization (including imaging, microbiology, ancillary and laboratory) are listed below for reference.    Significant Diagnostic Studies: Dg Chest 2 View  07/15/2013   CLINICAL DATA:  Shortness of breath.  EXAM: CHEST  2 VIEW  COMPARISON:  DG CHEST 1V PORT dated 03/13/2012; DG CHEST 1V PORT dated 10/14/2011; DG CHEST 2 VIEW dated 10/12/2011  FINDINGS: The heart size and mediastinal contours are stable with aortic atherosclerosis. The lungs are clear. There is no pleural effusion or pneumothorax. Old fracture of the right seventh rib appears stable. No acute osseous findings are evident. Telemetry leads overlie the chest.  IMPRESSION: Stable chest.  No acute cardiopulmonary process.   Electronically Signed   By: Camie Patience M.D.   On: 07/15/2013 10:57   Ct Chest Wo Contrast  07/16/2013   CLINICAL DATA:  Chest pain  EXAM: CT CHEST WITHOUT CONTRAST  TECHNIQUE: Multidetector CT imaging of the chest was performed following the standard protocol without IV contrast.  COMPARISON:  Plain film from the previous day  FINDINGS: The lungs are well aerated bilaterally. This some very mild patchy ground-glass infiltrate is noted predominately within the left lung. Some changes are noted in the right upper lobe which demonstrates some mild nodularity and tree in bud appearance. No focal confluent infiltrate is seen. Multiple calcified lymph nodes are identified particularly in the right hilum. Scattered mediastinal lymph nodes are seen. The largest of these lies in the right paratracheal region and measures 17 mm in short axis. Heavy coronary calcifications are seen. The upper abdomen shows multiple calcified splenic granulomas. No acute bony abnormality is seen.  IMPRESSION: Changes of prior granulomatous disease.  Multiple noncalcified with nodes within the mediastinum. Additionally knee patchy ground-glass infiltrate and tree in bud changes are noted  bilaterally. Changes are most consistent with an infectious in etiology correlation with sputum cultures is recommended.   Electronically Signed   By: Inez Catalina M.D.   On: 07/16/2013 10:32    Microbiology: Recent Results (from the past 240 hour(s))  MRSA PCR SCREENING     Status: None   Collection Time    07/15/13  1:51 PM      Result Value Ref Range Status   MRSA by PCR NEGATIVE  NEGATIVE Final   Comment:            The GeneXpert MRSA Assay (FDA     approved for NASAL specimens     only), is one component of a     comprehensive MRSA colonization     surveillance program. It is not     intended to diagnose MRSA     infection nor to guide or     monitor treatment for  MRSA infections.  CULTURE, BLOOD (ROUTINE X 2)     Status: None   Collection Time    07/15/13  2:19 PM      Result Value Ref Range Status   Specimen Description BLOOD RIGHT HAND   Final   Special Requests     Final   Value: BOTTLES DRAWN AEROBIC AND ANAEROBIC 10CC EACH BOTTLE   Culture NO GROWTH 5 DAYS   Final   Report Status 07/20/2013 FINAL   Final  CULTURE, BLOOD (ROUTINE X 2)     Status: None   Collection Time    07/15/13  2:46 PM      Result Value Ref Range Status   Specimen Description BLOOD RIGHT HAND   Final   Special Requests     Final   Value: BOTTLES DRAWN AEROBIC AND ANAEROBIC 8CC EACH BOTTLE   Culture NO GROWTH 5 DAYS   Final   Report Status 07/20/2013 FINAL   Final  RESPIRATORY VIRUS PANEL     Status: Abnormal   Collection Time    07/17/13  9:30 PM      Result Value Ref Range Status   Source - RVPAN NOSE   Final   Respiratory Syncytial Virus A NOT DETECTED   Final   Respiratory Syncytial Virus B NOT DETECTED   Final   Influenza A NOT DETECTED   Final   Influenza B NOT DETECTED   Final   Parainfluenza 1 NOT DETECTED   Final   Parainfluenza 2 NOT DETECTED   Final   Parainfluenza 3 DETECTED (*)  Final   Metapneumovirus NOT DETECTED   Final   Rhinovirus NOT DETECTED   Final   Adenovirus  NOT DETECTED   Final   Influenza A H1 NOT DETECTED   Final   Influenza A H3 NOT DETECTED   Final   Comment: (NOTE)           Normal Reference Range for each Analyte: NOT DETECTED     Testing performed using the Luminex xTAG Respiratory Viral Panel test     kit.     This test was developed and its performance characteristics determined     by [facility]. It has not been cleared or approved by the [place]     Food and Drug Administration. This test is used for clinical purposes.     It should not be regarded as investigational or for research. This     laboratory is certified under the [place] (CLIA) as qualified to perform high complexity     clinical laboratory testing.     Performed at [place], EXPECTORATED SPUTUM-ASSESSMENT     Status: None   Collection Time    07/17/13  9:30 PM      Result Value Ref Range Status   Specimen Description SPUTUM   Final   Special Requests NONE   Final   Sputum evaluation     Final   Value: THIS SPECIMEN IS ACCEPTABLE. RESPIRATORY CULTURE REPORT TO FOLLOW.   Report Status 07/17/2013 FINAL   Final  CULTURE, RESPIRATORY (NON-EXPECTORATED)     Status: None   Collection Time    07/17/13  9:30 PM      Result Value Ref Range Status   Specimen Description SPUTUM   Final   Special Requests NONE   Final   Gram Stain     Final   Value: RARE WBC PRESENT,BOTH  PMN AND MONONUCLEAR     RARE SQUAMOUS EPITHELIAL CELLS PRESENT     NO ORGANISMS SEEN     Performed at [facility]   Culture     Final   Value: NO GROWTH 2 DAYS     Performed at [facility]   Report Status 07/20/2013 FINAL   Final     Labs: Basic Metabolic Panel:  Recent Labs Lab 07/15/13 1022 07/16/13 0503 07/18/13 0415 07/19/13 0350  NA 137 140 140 142  K 3.7 4.2 5.0 4.3  CL 90* 96 95* 97  CO2 30 29 26 30   GLUCOSE 105* 101* 97 119*  BUN 12 28* 54* 25*  CREATININE 4.16* 6.96* 11.07* 6.53*   CALCIUM 9.6 8.6 8.7 8.6  PHOS  --   --  4.5  --    Liver Function Tests:  Recent Labs Lab 07/16/13 0503  AST 21  ALT 13  ALKPHOS 90  BILITOT 0.5  PROT 7.6  ALBUMIN 2.8*   No results found for this basename: LIPASE, AMYLASE,  in the last 168 hours No results found for this basename: AMMONIA,  in the last 168 hours CBC:  Recent Labs Lab 07/15/13 1022 07/19/13 0350  WBC 6.9 4.0  NEUTROABS 4.7  --   HGB 12.3* 8.9*  HCT 37.6* 27.5*  MCV 114.6* 115.1*  PLT 145* 174   Cardiac Enzymes:  Recent Labs Lab 07/15/13 1916  TROPONINI <0.30   BNP: BNP (last 3 results) No results found for this basename: PROBNP,  in the last 8760 hours CBG: No results found for this basename: GLUCAP,  in the last 168 hours  Active Problems:   CAP (community acquired pneumonia)   PNA (pneumonia)   Atrial fibrillation with rapid ventricular response   Time coordinating discharge: 45 minutes  Signed:  Marjean Donna, MD 07/21/2013, 5:15 PM

## 2013-07-21 NOTE — Progress Notes (Signed)
ANTIBIOTIC CONSULT NOTE -   Pharmacy Consult for Vancomycin & Tressie Ellis Indication: pneumonia  No Known Allergies  Patient Measurements: Height: _0  (175.3 cm) Weight: 182 lb 8.7 oz (82.8 kg) IBW/kg (Calculated) : 70.7  Vital Signs: Temp: 98.1 F (36.7 C) (04/10 0730) Temp src: Oral (04/10 0730) BP: 109/70 mmHg (04/10 1000) Pulse Rate: 78 (04/10 0800) Intake/Output from previous day: 04/09 0701 - 04/10 0700 In: 1306.1 [P.O.:930; I.V.:126.1; IV Piggyback:250] Out: 2646  Intake/Output from this shift:    Labs:  Recent Labs  07/19/13 0350  WBC 4.0  HGB 8.9*  PLT 174  CREATININE 6.53*   Estimated Creatinine Clearance: 10.4 ml/min (by C-G formula based on Cr of 6.53). No results found for this basename: VANCOTROUGH, Corlis Leak, VANCORANDOM, [place], GENTPEAK, GENTRANDOM, TOBRATROUGH, TOBRAPEAK, TOBRARND, AMIKACINPEAK, AMIKACINTROU, AMIKACIN,  in the last 72 hours   Microbiology: Recent Results (from the past 720 hour(s))  MRSA PCR SCREENING     Status: None   Collection Time    07/15/13  1:51 PM      Result Value Ref Range Status   MRSA by PCR NEGATIVE  NEGATIVE Final   Comment:            The GeneXpert MRSA Assay (FDA     approved for NASAL specimens     only), is one component of a     comprehensive MRSA colonization     surveillance program. It is not     intended to diagnose MRSA     infection nor to guide or     monitor treatment for     MRSA infections.  CULTURE, BLOOD (ROUTINE X 2)     Status: None   Collection Time    07/15/13  2:19 PM      Result Value Ref Range Status   Specimen Description BLOOD RIGHT HAND   Final   Special Requests     Final   Value: BOTTLES DRAWN AEROBIC AND ANAEROBIC 10CC EACH BOTTLE   Culture NO GROWTH 5 DAYS   Final   Report Status 07/20/2013 FINAL   Final  CULTURE, BLOOD (ROUTINE X 2)     Status: None   Collection Time    07/15/13  2:46 PM      Result Value Ref Range Status   Specimen Description BLOOD RIGHT HAND    Final   Special Requests     Final   Value: BOTTLES DRAWN AEROBIC AND ANAEROBIC 8CC EACH BOTTLE   Culture NO GROWTH 5 DAYS   Final   Report Status 07/20/2013 FINAL   Final  RESPIRATORY VIRUS PANEL     Status: Abnormal   Collection Time    07/17/13  9:30 PM      Result Value Ref Range Status   Source - RVPAN NOSE   Final   Respiratory Syncytial Virus A NOT DETECTED   Final   Respiratory Syncytial Virus B NOT DETECTED   Final   Influenza A NOT DETECTED   Final   Influenza B NOT DETECTED   Final   Parainfluenza 1 NOT DETECTED   Final   Parainfluenza 2 NOT DETECTED   Final   Parainfluenza 3 DETECTED (*)  Final   Metapneumovirus NOT DETECTED   Final   Rhinovirus NOT DETECTED   Final   Adenovirus NOT DETECTED   Final   Influenza A H1 NOT DETECTED   Final   Influenza A H3 NOT DETECTED   Final   Comment: (NOTE)  Normal Reference Range for each Analyte: NOT DETECTED     Testing performed using the Luminex xTAG Respiratory Viral Panel test     kit.     This test was developed and its performance characteristics determined     by [facility]. It has not been cleared or approved by the [place]     Food and Drug Administration. This test is used for clinical purposes.     It should not be regarded as investigational or for research. This     laboratory is certified under the [place] (CLIA) as qualified to perform high complexity     clinical laboratory testing.     Performed at [place], EXPECTORATED SPUTUM-ASSESSMENT     Status: None   Collection Time    07/17/13  9:30 PM      Result Value Ref Range Status   Specimen Description SPUTUM   Final   Special Requests NONE   Final   Sputum evaluation     Final   Value: THIS SPECIMEN IS ACCEPTABLE. RESPIRATORY CULTURE REPORT TO FOLLOW.   Report Status 07/17/2013 FINAL   Final  CULTURE, RESPIRATORY (NON-EXPECTORATED)     Status: None   Collection Time     07/17/13  9:30 PM      Result Value Ref Range Status   Specimen Description SPUTUM   Final   Special Requests NONE   Final   Gram Stain     Final   Value: RARE WBC PRESENT,BOTH PMN AND MONONUCLEAR     RARE SQUAMOUS EPITHELIAL CELLS PRESENT     NO ORGANISMS SEEN     Performed at [facility]   Culture     Final   Value: NO GROWTH 2 DAYS     Performed at [facility]   Report Status 07/20/2013 FINAL   Final   Medical History: Past Medical History  Diagnosis Date  . HTN (hypertension)   . Arthritis   . Hypercholesteremia   . MI (myocardial infarction) 03/16/2011    Inferolateral ST segment elevation MI  . GERD (gastroesophageal reflux disease)   . Atrial fibrillation     Paroxysmal - 2 episodes related to ischemic NSTEMI events  5 & 09/2011  . Gout   . S/P coronary artery stent placement, 10/01/11 with DES to mid LAD and  PTCA of Diag 2 10/01/2009    Inferior STEMI 2011 - BMS to LCx; NSTEMI 5/13 with ISR/thrombosis of Cx BMS -->multiple DES to  LCx ; staged PCI of LAD /Balloon PTCA of major Diag 09/2011 for Sx (NSTEMI)   . Anemia   . Schatzki's ring   . Angiodysplasia of intestine   . Aortic stenosis, moderate     2D Echocardiogram 12/13 and 08/22/11 showed moderate aortic stenosis with valve area of 1.28 sq cm. EF of 45 to 50% with severe hypokinesia of the entire inferolateral myocardium  . ESRD on hemodialysis     [facility] HD, TTS, esrd due to HTN  . Right renal artery stenosis 1998  . Ulcer    Medications:  Scheduled:  . albuterol  2.5 mg Nebulization Q6H  . allopurinol  100 mg Oral BID  . amiodarone  100 mg Oral QHS  . atorvastatin  10 mg Oral q1800  . calcium-vitamin D  1 tablet Oral Q breakfast  . cefTAZidime (FORTAZ)  IV  2 g Intravenous Q T,Th,Sa-HD  . cinacalcet  30 mg Oral QODAY  . diltiazem  30 mg Oral 3 times per day  . epoetin alfa  10,000 Units Intravenous Q T,Th,Sa-HD  . metoprolol tartrate  12.5 mg Oral BID  . multivitamin  1 tablet Oral  Daily  . pantoprazole  40 mg Oral QAC breakfast  . PARoxetine  20 mg Oral Daily  . prasugrel  10 mg Oral Daily  . sevelamer carbonate  2,400 mg Oral TID WC  . vancomycin  1,000 mg Intravenous Q T,Th,Sa-HD   Assessment: 73 yo M with ESRD requiring HD presents with shortness of breath, clinical presentation of PNA.     He was initially started on Rocephin & Zithromax in ED, however discussed with MD and plan to broaden coverage since patient meets criteria for HCAP (dialysis ctr) and possible sepsis.  Pt is currently afebrile.    Vancomycin 4/4>> Tressie Ellis 4/4>> Rocephin 4/4>>4/4 Zithromax 4/4>>4/4  Goal of Therapy:  Pre-HD level of 15-25 mcg/ml  Plan:  Vancomycin 1gm IV qHD Fortaz 2gm IV qHD Check pre-HD Vancomycin level at steady state Monitor labs and cx data   [employer] 07/21/2013,10:43 AM

## 2013-07-24 NOTE — Progress Notes (Signed)
UR chart review completed.  

## 2013-07-31 ENCOUNTER — Encounter: Payer: Self-pay | Admitting: *Deleted

## 2013-08-02 ENCOUNTER — Ambulatory Visit (INDEPENDENT_AMBULATORY_CARE_PROVIDER_SITE_OTHER): Payer: Medicare Other | Admitting: Cardiology

## 2013-08-02 ENCOUNTER — Encounter: Payer: Self-pay | Admitting: Cardiology

## 2013-08-02 VITALS — BP 122/62 | HR 59 | Ht 69.0 in | Wt 184.0 lb

## 2013-08-02 DIAGNOSIS — Z992 Dependence on renal dialysis: Secondary | ICD-10-CM

## 2013-08-02 DIAGNOSIS — I350 Nonrheumatic aortic (valve) stenosis: Secondary | ICD-10-CM

## 2013-08-02 DIAGNOSIS — I480 Paroxysmal atrial fibrillation: Secondary | ICD-10-CM

## 2013-08-02 DIAGNOSIS — R0609 Other forms of dyspnea: Secondary | ICD-10-CM | POA: Insufficient documentation

## 2013-08-02 DIAGNOSIS — Z955 Presence of coronary angioplasty implant and graft: Secondary | ICD-10-CM

## 2013-08-02 DIAGNOSIS — N189 Chronic kidney disease, unspecified: Secondary | ICD-10-CM

## 2013-08-02 DIAGNOSIS — R0600 Dyspnea, unspecified: Secondary | ICD-10-CM

## 2013-08-02 DIAGNOSIS — D631 Anemia in chronic kidney disease: Secondary | ICD-10-CM

## 2013-08-02 DIAGNOSIS — I359 Nonrheumatic aortic valve disorder, unspecified: Secondary | ICD-10-CM

## 2013-08-02 DIAGNOSIS — R0989 Other specified symptoms and signs involving the circulatory and respiratory systems: Secondary | ICD-10-CM

## 2013-08-02 DIAGNOSIS — N186 End stage renal disease: Secondary | ICD-10-CM

## 2013-08-02 DIAGNOSIS — I4891 Unspecified atrial fibrillation: Secondary | ICD-10-CM

## 2013-08-02 DIAGNOSIS — Z9861 Coronary angioplasty status: Secondary | ICD-10-CM

## 2013-08-02 DIAGNOSIS — N039 Chronic nephritic syndrome with unspecified morphologic changes: Secondary | ICD-10-CM

## 2013-08-02 NOTE — Patient Instructions (Signed)
Continue your current medications. It is OK to hold Diltiazem if needed for dialysis. Return to see Dr Allyson SabalBerry in 2-3 weeks.

## 2013-08-02 NOTE — Assessment & Plan Note (Signed)
Peak gradient 54 mmHg in Nov

## 2013-08-02 NOTE — Assessment & Plan Note (Signed)
CAD status post OM, LAD, Dx PCI. No Angina

## 2013-08-02 NOTE — Assessment & Plan Note (Signed)
This persists since discharge

## 2013-08-02 NOTE — Assessment & Plan Note (Signed)
Pt recently admitted to [facility] with CAP, had PAF on HD

## 2013-08-02 NOTE — Assessment & Plan Note (Signed)
Last Hgb 8.6

## 2013-08-02 NOTE — Progress Notes (Signed)
08/02/2013 Roger JarvisLarry L [name]   02/09/1941  [id number]  Primary Physicia Alice ReichertMCINNIS,ANGUS G, MD Primary Cardiologist: Dr Allyson SabalBerry  HPI:  73 y/o followed by Dr Allyson SabalBerry with a history of CAD, PAF, ESRD, and GI bleeding. [place]. He was recently admitted there with suspected CAP. While hospitalized he had AF with RVR while on HD. Dr [name] saw him in consult. Diltiazem was added and he converted to NSR. He is in the office today for follow up. Since discharge he continues to complain of DOE. He denies angina. He denies syncope. He has not had recurrent palpitations. His B/P is stable on Diltiazem 30 mg TID.    Current Outpatient Prescriptions  Medication Sig Dispense Refill  . allopurinol (ZYLOPRIM) 100 MG tablet Take 100 mg by mouth 2 (two) times daily.       [name]. amiodarone (PACERONE) 200 MG tablet Take 100 mg by mouth at bedtime.      . calcium-vitamin D (OSCAL WITH D) 500-200 MG-UNIT per tablet Take 1 tablet by mouth daily with breakfast.      . cinacalcet (SENSIPAR) 30 MG tablet Take 30 mg by mouth every other day.       . diltiazem (CARDIZEM) 30 MG tablet Take 1 tablet (30 mg total) by mouth 3 (three) times daily.  90 tablet  2  . isosorbide mononitrate (IMDUR) 30 MG 24 hr tablet Take 1 tablet (30 mg total) by mouth at bedtime.  90 tablet  3  . metoprolol tartrate (LOPRESSOR) 25 MG tablet Take 0.5 tablets (12.5 mg total) by mouth 2 (two) times daily.  90 tablet  3  . multivitamin (RENA-VIT) TABS tablet Take 1 tablet by mouth every morning.       . nitroGLYCERIN (NITROSTAT) 0.4 MG SL tablet Place 1 tablet (0.4 mg total) under the tongue every 5 (five) minutes as needed.  25 tablet  5  . pantoprazole (PROTONIX) 40 MG tablet Take 1 tablet (40 mg total) by mouth daily before breakfast.      . PARoxetine (PAXIL) 20 MG tablet Take 20 mg by mouth daily.      . prasugrel (EFFIENT) 10 MG TABS tablet Take 1 tablet (10 mg total) by mouth daily.  90 tablet  3  . rosuvastatin (CRESTOR) 10  MG tablet Take 10 mg by mouth at bedtime.       . sevelamer (RENVELA) 800 MG tablet Take 2,400 mg by mouth 3 (three) times daily with meals.       No current facility-administered medications for this visit.   Facility-Administered Medications Ordered in Other Visits  Medication Dose Route Frequency Provider Last Rate Last Dose  . 0.9 %  sodium chloride infusion  250 mL Intravenous Once Lauris PoagAlvin C Powell, MD      . 0.9 %  sodium chloride infusion  250 mL Intravenous Once Lauris PoagAlvin C Powell, MD      . heparin lock flush 100 unit/mL  500 Units Intracatheter Daily PRN Lauris PoagAlvin C Powell, MD      . heparin lock flush 100 unit/mL  250 Units Intracatheter PRN Lauris PoagAlvin C Powell, MD      . heparin lock flush 100 unit/mL  500 Units Intracatheter Daily PRN Lauris PoagAlvin C Powell, MD      . heparin lock flush 100 unit/mL  250 Units Intracatheter PRN Lauris PoagAlvin C Powell, MD      . sodium chloride 0.9 % injection 10 mL  10 mL Intracatheter PRN Lauris PoagAlvin C Powell, MD      .  sodium chloride 0.9 % injection 10 mL  10 mL Intracatheter PRN Lauris PoagAlvin C Powell, MD      . sodium chloride 0.9 % injection 3 mL  3 mL Intracatheter PRN Lauris PoagAlvin C Powell, MD      . sodium chloride 0.9 % injection 3 mL  3 mL Intracatheter PRN Lauris PoagAlvin C Powell, MD        No Known Allergies  History   Social History  . Marital Status: Married    Spouse Name: N/A    Number of Children: 3  . Years of Education: 10   Occupational History  . retired    Social History Main Topics  . Smoking status: Former Smoker -- 0.50 packs/day for 45 years    Types: Cigarettes    Quit date: 04/13/1992  . Smokeless tobacco: Current User    Types: Chew  . Alcohol Use: No  . Drug Use: No  . Sexual Activity: No   Other Topics Concern  . Not on file   Social History Narrative   Occupation: raised Tobacco, worked for [employer] as well.      Review of Systems: General: negative for chills, fever, night sweats or weight changes.  Cardiovascular: negative for chest pain, edema,  orthopnea, palpitations, paroxysmal nocturnal dyspnea Dermatological: negative for rash Respiratory: negative for cough or wheezing Urologic: negative for hematuria Abdominal: negative for nausea, vomiting, diarrhea, bright red blood per rectum, melena, or hematemesis Neurologic: negative for visual changes, syncope, or dizziness All other systems reviewed and are otherwise negative except as noted above.    Blood pressure 122/62, pulse 59, height 5\' 9"  (1.753 m), weight 184 lb (83.462 kg).  General appearance: alert, cooperative and no distress Lungs: decreased breath sounds Rt base Heart: regular rate and rhythm and 2/6 systolic murmur AOv Extremities: no edema  EKG NSR, SB 59. QTc 504  ASSESSMENT AND PLAN:   Atrial fibrillation with rapid ventricular response Pt recently admitted to [facility] with CAP, had PAF on HD  Dyspnea on exertion This persists since discharge  Aortic stenosis, moderate to severe Nov 2014 Peak gradient 54 mmHg in Nov  End stage renal disease on dialysis; HD T, Th Sat Dr Lowell GuitarPowell follows  PAF-NSR on Amiodarone, no Coumadin secondary to GI bleeding .  S/P- DES to LCx-OM -5/13, BMS to LCx-OM; 10/01/11 with DES to mid LAD and  PTCA of Diag 2 CAD status post OM, LAD, Dx PCI. No Angina  Anemia of chronic renal failure Last Hgb 8.6   PLAN  [place] lab in [place] and I will try and get those results. With his CAD and AS would keep Hgb > 8. I'm concerned his DOE is secondary to progressing AS. He may still be recovering from his CAP. I would like him to follow up with Dr Allyson SabalBerry in two weeks. If his symptoms don't improve it may be time to consider cath. His QTc is long but he just finished a course of Levaquin. Continue his current medications, his Diltiazem can be held on HD days in needed but would not hold Amiodarone. F/U EKG- Qtc at his OV with Dr Allyson SabalBerry.   Eda PaschalLuke K [facility] Center LLCKilroyPA-C 08/02/2013 11:35 AM

## 2013-08-02 NOTE — Assessment & Plan Note (Signed)
Dr Lowell GuitarPowell follows

## 2013-08-08 ENCOUNTER — Telehealth: Payer: Self-pay | Admitting: *Deleted

## 2013-08-08 NOTE — Telephone Encounter (Signed)
Returned call and pt verified x 2 w/ Neysa Bonitohristy, pt's daughter.  Stated they were in last week for f/u.  Stated pt was put on Cardizem to bring down HR and pt told provider he was SOB.  Stated her mom said pt is weak, lies around a lot and just feels bad.  Stated BP "seemed to be normal" and HR ~ 60.  Stated pt said "he didn't start feeling like this until he started the medicine."   Stated her mom said pt seems to be "stumbley" at times and pt has never had any trouble w/ mobility.  Stated pt w/o CP, but has SOB.  Stated pt is also having to get O2 when taking dialysis.  Denied pt has been Holding Cardizem on his dialysis days.  Stated her mom was not aware of this and has been giving it to him every day.    Advice  HOLD Cardizem on dialysis days as directed  Dr. Allyson SabalBerry will be notified to review and advise  Next appt 5.11.15 at 10:30 am w/ Dr. Allyson SabalBerry  Verbalized understanding and agreed w/ plan.

## 2013-08-08 NOTE — Telephone Encounter (Signed)
Pt's daughter was calling about the new medication that he was given. He is SOB and can not stand up to even shave. He is on Cardizem.  JB

## 2013-08-10 NOTE — Telephone Encounter (Signed)
Dr Allyson SabalBerry reviewed the message yesterday afternoon and wanted me to see if the patient could come in sooner to see an APP.   I called [name] (daughter) today and asked how Mr Roger [name] was doing.  She had not checked back in with him since the medication change on Tuesday.  I offered an appt with either a MD or APP sooner than 5/11.  She verbalized understanding and will contact me if Mr Roger [name] is still not feeling well.

## 2013-08-25 ENCOUNTER — Encounter: Payer: Self-pay | Admitting: Cardiovascular Disease

## 2013-08-25 ENCOUNTER — Ambulatory Visit (INDEPENDENT_AMBULATORY_CARE_PROVIDER_SITE_OTHER): Payer: Medicare Other | Admitting: Cardiovascular Disease

## 2013-08-25 VITALS — BP 128/62 | HR 56 | Ht 69.0 in | Wt 180.0 lb

## 2013-08-25 DIAGNOSIS — I350 Nonrheumatic aortic (valve) stenosis: Secondary | ICD-10-CM

## 2013-08-25 DIAGNOSIS — I10 Essential (primary) hypertension: Secondary | ICD-10-CM

## 2013-08-25 DIAGNOSIS — I4891 Unspecified atrial fibrillation: Secondary | ICD-10-CM

## 2013-08-25 DIAGNOSIS — E785 Hyperlipidemia, unspecified: Secondary | ICD-10-CM

## 2013-08-25 DIAGNOSIS — R0989 Other specified symptoms and signs involving the circulatory and respiratory systems: Secondary | ICD-10-CM

## 2013-08-25 DIAGNOSIS — I359 Nonrheumatic aortic valve disorder, unspecified: Secondary | ICD-10-CM

## 2013-08-25 DIAGNOSIS — Z955 Presence of coronary angioplasty implant and graft: Secondary | ICD-10-CM

## 2013-08-25 DIAGNOSIS — R0609 Other forms of dyspnea: Secondary | ICD-10-CM

## 2013-08-25 DIAGNOSIS — Z9861 Coronary angioplasty status: Secondary | ICD-10-CM

## 2013-08-25 NOTE — Assessment & Plan Note (Addendum)
The patient has moderate aortic stenosis with preserved LV function. His last echo performed 02/21/13 revealed an EF of 60-65% with mild to moderate aortic insufficiency and moderate aortic stenosis with a valve area of 0.88 cm.he does have class II to class III symptoms of dyspnea on exertion. I suspect he'll need aortic valve replacement sometime within the next 12-18 months. We will get a 2-D echocardiogram now and again in 6 months.

## 2013-08-25 NOTE — Assessment & Plan Note (Signed)
Status post multiple coronary interventions in the past currently asymptomatic on antiplatelet therapy.

## 2013-08-25 NOTE — Patient Instructions (Addendum)
  We will see you back in follow up in 6 months after your echocardiogram  Dr Allyson SabalBerry has ordered : 1.  Echocardiogram (in the near future and again in 6 months). Echocardiography is a painless test that uses sound waves to create images of your heart. It provides your doctor with information about the size and shape of your heart and how well your heart's chambers and valves are working. This procedure takes approximately one hour. There are no restrictions for this procedure.   2. Carotid Duplex- This test is an ultrasound of the carotid arteries in your neck. It looks at blood flow through these arteries that supply the brain with blood. Allow one hour for this exam. There are no restrictions or special instructions.

## 2013-08-25 NOTE — Assessment & Plan Note (Signed)
On statin therapy followed by his PCP 

## 2013-08-25 NOTE — Assessment & Plan Note (Signed)
Controlled on current medications 

## 2013-08-25 NOTE — Progress Notes (Signed)
08/25/2013 Roger [name]   07-25-1940  [id number]  Primary Physician [name], MD Primary Cardiologist: Runell Gess MD [name]   HPI:  He is a 73 year old married Caucasian male father of 3, grandfather of 4 grandchildren, accompanied by his daughter today. I saw him approximately 3 weeks ago. He has a history of ischemic heart disease, status post multiple interventions in the past. He also has chronic renal insufficiency, on hemodialysis, hypertension, and hyperlipidemia. He has had AFib in the past and has mild to moderate aortic stenosis. He was on aspirin, Effient, and Coumadin and had a GI bleed and had endoscopy by Dr. Leone Payor from above and below with no source found. Ultimately he was transfused 4 units of packed red blood cells. His hemoglobin improved and his bleeding resolved spontaneously. He was taken off his aspirin and his Coumadin, maintaining sinus rhythm.I last saw him in the office in December 2013. He was admitted in March with recurrent GI bleed. His antiplatelet agent was held for one month and restarted. He denies chest pain or shortness of breath. He does have a recent 2-D echo was suggested progression of his aortic stenosis now with a valve area of 0.8 cm.since I saw him 6 months ago he has been relatively asymptomatic specifically denying chest pain or shortness of breath. Recent lower extremity artery Doppler studies showed normal ABIs with progression of his disease in the iliac arteries. Since I saw him in the office 05/19/13 he has been admitted with pneumonia and was in atrial fibrillation. He was placed on diltiazem by Dr. Diona Browner. Unfortunately, he was not able to tolerate this because of bradycardia and hypotension especially during hemodialysis. He does complain of increasing dyspnea on exertion. I suspect this is multifactorial without progression of his aortic stenosis.     Current Outpatient Prescriptions  Medication Sig Dispense  Refill  . allopurinol (ZYLOPRIM) 100 MG tablet Take 100 mg by mouth 2 (two) times daily.       [name] amiodarone (PACERONE) 200 MG tablet Take 100 mg by mouth at bedtime.      . calcium-vitamin D (OSCAL WITH D) 500-200 MG-UNIT per tablet Take 1 tablet by mouth daily with breakfast.      . cinacalcet (SENSIPAR) 30 MG tablet Take 30 mg by mouth every other day.       . isosorbide mononitrate (IMDUR) 30 MG 24 hr tablet Take 1 tablet (30 mg total) by mouth at bedtime.  90 tablet  3  . metoprolol tartrate (LOPRESSOR) 25 MG tablet Take 0.5 tablets (12.5 mg total) by mouth 2 (two) times daily.  90 tablet  3  . multivitamin (RENA-VIT) TABS tablet Take 1 tablet by mouth every morning.       . nitroGLYCERIN (NITROSTAT) 0.4 MG SL tablet Place 1 tablet (0.4 mg total) under the tongue every 5 (five) minutes as needed.  25 tablet  5  . pantoprazole (PROTONIX) 40 MG tablet Take 1 tablet (40 mg total) by mouth daily before breakfast.      . PARoxetine (PAXIL) 20 MG tablet Take 20 mg by mouth daily.      . prasugrel (EFFIENT) 10 MG TABS tablet Take 1 tablet (10 mg total) by mouth daily.  90 tablet  3  . rosuvastatin (CRESTOR) 10 MG tablet Take 10 mg by mouth at bedtime.       . sevelamer (RENVELA) 800 MG tablet Take 2,400 mg by mouth 3 (three) times daily with meals.  No current facility-administered medications for this visit.   Facility-Administered Medications Ordered in Other Visits  Medication Dose Route Frequency Provider Last Rate Last Dose  . 0.9 %  sodium chloride infusion  250 mL Intravenous Once Lauris PoagAlvin C Powell, MD      . 0.9 %  sodium chloride infusion  250 mL Intravenous Once Lauris PoagAlvin C Powell, MD      . heparin lock flush 100 unit/mL  500 Units Intracatheter Daily PRN Lauris PoagAlvin C Powell, MD      . heparin lock flush 100 unit/mL  250 Units Intracatheter PRN Lauris PoagAlvin C Powell, MD      . heparin lock flush 100 unit/mL  500 Units Intracatheter Daily PRN Lauris PoagAlvin C Powell, MD      . heparin lock flush 100 unit/mL   250 Units Intracatheter PRN Lauris PoagAlvin C Powell, MD      . sodium chloride 0.9 % injection 10 mL  10 mL Intracatheter PRN Lauris PoagAlvin C Powell, MD      . sodium chloride 0.9 % injection 10 mL  10 mL Intracatheter PRN Lauris PoagAlvin C Powell, MD      . sodium chloride 0.9 % injection 3 mL  3 mL Intracatheter PRN Lauris PoagAlvin C Powell, MD      . sodium chloride 0.9 % injection 3 mL  3 mL Intracatheter PRN Lauris PoagAlvin C Powell, MD        Allergies  Allergen Reactions  . Diltiazem     Not able to tolerate due to SOB    History   Social History  . Marital Status: Married    Spouse Name: N/A    Number of Children: 3  . Years of Education: 10   Occupational History  . retired    Social History Main Topics  . Smoking status: Former Smoker -- 0.50 packs/day for 45 years    Types: Cigarettes    Quit date: 04/13/1992  . Smokeless tobacco: Current User    Types: Chew  . Alcohol Use: No  . Drug Use: No  . Sexual Activity: No   Other Topics Concern  . Not on file   Social History Narrative   Occupation: raised Tobacco, worked for [employer] as well.      Review of Systems: General: negative for chills, fever, night sweats or weight changes.  Cardiovascular: negative for chest pain, dyspnea on exertion, edema, orthopnea, palpitations, paroxysmal nocturnal dyspnea or shortness of breath Dermatological: negative for rash Respiratory: negative for cough or wheezing Urologic: negative for hematuria Abdominal: negative for nausea, vomiting, diarrhea, bright red blood per rectum, melena, or hematemesis Neurologic: negative for visual changes, syncope, or dizziness All other systems reviewed and are otherwise negative except as noted above.    Blood pressure 128/62, pulse 56, height 5\' 9"  (1.753 m), weight 180 lb (81.647 kg).  General appearance: alert and no distress Neck: no adenopathy, no JVD, supple, symmetrical, trachea midline, thyroid not enlarged, symmetric, no tenderness/mass/nodules and left carotid  bruit Lungs: clear to auscultation bilaterally Heart: typical aortic stenosis murmur Extremities: extremities normal, atraumatic, no cyanosis or edema  EKG sinus bradycardia at 56 with Q waves in lead 1 and L. and  ASSESSMENT AND PLAN:   Aortic stenosis, moderate to severe Nov 2014 The patient has moderate aortic stenosis with preserved LV function. His last echo performed 02/21/13 revealed an EF of 60-65% with mild to moderate aortic insufficiency and moderate aortic stenosis with a valve area of 0.88 cm.he does have class II to class III  symptoms of dyspnea on exertion. I suspect he'll need aortic valve replacement sometime within the next 12-18 months. We will get a 2-D echocardiogram now and again in 6 months.  Atrial fibrillation with rapid ventricular response Maintaining  normal sinus rhythm on amiodarone and a beta blocker as well as Effient . He is not on an oral anticoagulants because of GI bleeding.he was recently placed on diltiazem however he became symptomatic with bradycardia and hypotension and this was discontinued by the patient.  S/P- DES to LCx-OM -5/13, BMS to LCx-OM; 10/01/11 with DES to mid LAD and  PTCA of Diag 2 Status post multiple coronary interventions in the past currently asymptomatic on antiplatelet therapy.  HTN (hypertension) Controlled on current medications  HLD (hyperlipidemia) On statin therapy followed by his PCP      Runell GessJonathan J. Dorothy Polhemus MD [facility] PortlandFACP,FACC,FAHA, [facility] HospitalFSCAI 08/25/2013 11:36 AM

## 2013-08-25 NOTE — Assessment & Plan Note (Signed)
Maintaining  normal sinus rhythm on amiodarone and a beta blocker as well as Effient . He is not on an oral anticoagulants because of GI bleeding.he was recently placed on diltiazem however he became symptomatic with bradycardia and hypotension and this was discontinued by the patient.

## 2013-09-08 ENCOUNTER — Ambulatory Visit (HOSPITAL_COMMUNITY)
Admission: RE | Admit: 2013-09-08 | Discharge: 2013-09-08 | Disposition: A | Discharge status: Home / Self Care | Payer: Medicare Other | Source: Ambulatory Visit | Attending: Internal Medicine | Admitting: Internal Medicine

## 2013-09-08 ENCOUNTER — Ambulatory Visit (HOSPITAL_BASED_OUTPATIENT_CLINIC_OR_DEPARTMENT_OTHER)
Admission: RE | Admit: 2013-09-08 | Discharge: 2013-09-08 | Disposition: A | Discharge status: Home / Self Care | Payer: Medicare Other | Source: Ambulatory Visit | Attending: Internal Medicine | Admitting: Internal Medicine

## 2013-09-08 DIAGNOSIS — R0609 Other forms of dyspnea: Principal | ICD-10-CM

## 2013-09-08 DIAGNOSIS — I350 Nonrheumatic aortic (valve) stenosis: Secondary | ICD-10-CM

## 2013-09-08 DIAGNOSIS — I359 Nonrheumatic aortic valve disorder, unspecified: Secondary | ICD-10-CM

## 2013-09-08 DIAGNOSIS — R0989 Other specified symptoms and signs involving the circulatory and respiratory systems: Secondary | ICD-10-CM

## 2013-09-08 NOTE — Progress Notes (Signed)
Carotid Duplex Completed. Laylanie Kruczek, BS, RDMS, RVT  

## 2013-09-08 NOTE — Progress Notes (Addendum)
2D Echocardiogram Complete.  09/08/2013   Dhruvi Crenshaw, RDCS  Preliminary Technician Findings: Mildly decreased EF, with hypokinesis of the Posterior wall.  Moderate Aortic Stenosis (as seen prior), but with a "lower flow" state, Velocities may be underestimated.

## 2013-09-11 ENCOUNTER — Telehealth: Payer: Self-pay | Admitting: Cardiovascular Disease

## 2013-09-11 NOTE — Progress Notes (Signed)
LMTCB regarding tests (echo and carotid)

## 2013-09-11 NOTE — Telephone Encounter (Signed)
JENNA  RN SPOKE TO WIFE. RESULTS GIVEN

## 2013-09-11 NOTE — Telephone Encounter (Signed)
Returning Call    Thanks

## 2013-09-15 NOTE — Telephone Encounter (Signed)
Closed encounter °

## 2013-09-21 ENCOUNTER — Other Ambulatory Visit: Payer: Self-pay | Admitting: Cardiovascular Disease

## 2013-09-21 NOTE — Telephone Encounter (Signed)
Need a new prescription for Crestor 10 mg #90 and refills. Please fax to [phone]-[facility].

## 2013-09-22 MED ORDER — ROSUVASTATIN CALCIUM 10 MG PO TABS: 10.0000 mg | ORAL_TABLET | Freq: Every day | ORAL | Status: DC

## 2013-09-22 NOTE — Telephone Encounter (Signed)
Spoke with patient daughter and informed her there may be an issue with faxing the Rx due to the "VOID" but that i would have Dr.Berry sign it and fax it over today. Voiced understanding.

## 2013-10-06 ENCOUNTER — Encounter: Payer: Self-pay | Admitting: Cardiovascular Disease

## 2013-10-06 ENCOUNTER — Ambulatory Visit (INDEPENDENT_AMBULATORY_CARE_PROVIDER_SITE_OTHER): Payer: Medicare Other | Admitting: Cardiovascular Disease

## 2013-10-06 VITALS — BP 151/68 | HR 53 | Ht 69.0 in | Wt 179.2 lb

## 2013-10-06 DIAGNOSIS — I359 Nonrheumatic aortic valve disorder, unspecified: Secondary | ICD-10-CM

## 2013-10-06 DIAGNOSIS — E785 Hyperlipidemia, unspecified: Secondary | ICD-10-CM

## 2013-10-06 DIAGNOSIS — I350 Nonrheumatic aortic (valve) stenosis: Secondary | ICD-10-CM

## 2013-10-06 DIAGNOSIS — D689 Coagulation defect, unspecified: Secondary | ICD-10-CM

## 2013-10-06 DIAGNOSIS — R5383 Other fatigue: Secondary | ICD-10-CM

## 2013-10-06 DIAGNOSIS — R5381 Other malaise: Secondary | ICD-10-CM

## 2013-10-06 DIAGNOSIS — Z79899 Other long term (current) drug therapy: Secondary | ICD-10-CM

## 2013-10-06 DIAGNOSIS — I10 Essential (primary) hypertension: Secondary | ICD-10-CM

## 2013-10-06 NOTE — Assessment & Plan Note (Signed)
Controlled on current medications 

## 2013-10-06 NOTE — Assessment & Plan Note (Addendum)
Digitate has severe aortic stenosis, moderate left ventricular dysfunction, history of known CAD status post intervention 2 years ago and now progressive dyspnea on exertion. His most recent echo performed 09/11/13 revealed an ejection fraction of 45% with segmental wall motion abnormalities, valve area 0.93 cmand peak gradient of 58 mm of mercury.based on this, I decided to proceed with outpatient right and left heart cath at the time his anatomy physiology. I believe he's approaching the need for aortic valve replacement. This will be done on a nondialysis day as an outpatient.

## 2013-10-06 NOTE — Progress Notes (Signed)
10/06/2013 Roger JarvisLarry L [name]   01-Dec-1940  [id number]  Primary Physician Alice ReichertMCINNIS,ANGUS G, MD Primary Cardiologist: Runell GessJonathan J. Berry MD Roseanne RenoFACP,FACC,FAHA, FSCAI   HPI:  He is a 73 year old married Caucasian male father of 3, grandfather of 4 grandchildren, accompanied by his daughter today. I saw him approximately 3 weeks ago. He has a history of ischemic heart disease, status post multiple interventions in the past. He also has chronic renal insufficiency, on hemodialysis, hypertension, and hyperlipidemia. He has had AFib in the past and has mild to moderate aortic stenosis. He was on aspirin, Effient, and Coumadin and had a GI bleed and had endoscopy by [name] from above and below with no source found. Ultimately he was transfused 4 units of packed red blood cells. His hemoglobin improved and his bleeding resolved spontaneously. He was taken off his aspirin and his Coumadin, maintaining sinus rhythm.I last saw him in the office in December 2013. He was admitted in March with recurrent GI bleed. His antiplatelet agent was held for one month and restarted. He denies chest pain or shortness of breath. He does have a recent 2-D echo was suggested progression of his aortic stenosis now with a valve area of 0.8 cm.since I saw him 6 months ago he has been relatively asymptomatic specifically denying chest pain or shortness of breath. Recent lower extremity artery Doppler studies showed normal ABIs with progression of his disease in the iliac arteries. Since I saw him in the office 05/19/13 he has been admitted with pneumonia and was in atrial fibrillation. He was placed on diltiazem by Dr. [name]. Unfortunately, he was not able to tolerate this because of bradycardia and hypotension especially during hemodialysis. He does complain of increasing dyspnea on exertion. I suspect this is multifactorial with progression of his aortic stenosis.recent 2-D echocardiogram performed several weeks ago revealed an  ejection fraction of 45%, segmental wall motion abnormalities and severe aortic stenosis with a valve area of 0.93 cm I. The peak gradient of 58 mmHg.    Current Outpatient Prescriptions  Medication Sig Dispense Refill  . allopurinol (ZYLOPRIM) 100 MG tablet Take 100 mg by mouth 2 (two) times daily.       [name]. amiodarone (PACERONE) 200 MG tablet Take 100 mg by mouth at bedtime.      . calcium-vitamin D (OSCAL WITH D) 500-200 MG-UNIT per tablet Take 1 tablet by mouth daily with breakfast.      . cinacalcet (SENSIPAR) 30 MG tablet Take 30 mg by mouth every other day.       . isosorbide mononitrate (IMDUR) 30 MG 24 hr tablet Take 1 tablet (30 mg total) by mouth at bedtime.  90 tablet  3  . metoprolol tartrate (LOPRESSOR) 25 MG tablet Take 0.5 tablets (12.5 mg total) by mouth 2 (two) times daily.  90 tablet  3  . multivitamin (RENA-VIT) TABS tablet Take 1 tablet by mouth every morning.       . nitroGLYCERIN (NITROSTAT) 0.4 MG SL tablet Place 1 tablet (0.4 mg total) under the tongue every 5 (five) minutes as needed.  25 tablet  5  . pantoprazole (PROTONIX) 40 MG tablet Take 1 tablet (40 mg total) by mouth daily before breakfast.      . PARoxetine (PAXIL) 20 MG tablet Take 20 mg by mouth daily.      . prasugrel (EFFIENT) 10 MG TABS tablet Take 1 tablet (10 mg total) by mouth daily.  90 tablet  3  . rosuvastatin (CRESTOR) 10  MG tablet Take 1 tablet (10 mg total) by mouth at bedtime.  90 tablet  3  . sevelamer (RENVELA) 800 MG tablet Take 2,400 mg by mouth 3 (three) times daily with meals.       No current facility-administered medications for this visit.   Facility-Administered Medications Ordered in Other Visits  Medication Dose Route Frequency Provider Last Rate Last Dose  . 0.9 %  sodium chloride infusion  250 mL Intravenous Once [name], MD      . 0.9 %  sodium chloride infusion  250 mL Intravenous Once [name], MD      . heparin lock flush 100 unit/mL  500 Units Intracatheter Daily  PRN [name], MD      . heparin lock flush 100 unit/mL  250 Units Intracatheter PRN [name], MD      . heparin lock flush 100 unit/mL  500 Units Intracatheter Daily PRN [name], MD      . heparin lock flush 100 unit/mL  250 Units Intracatheter PRN [name], MD      . sodium chloride 0.9 % injection 10 mL  10 mL Intracatheter PRN [name], MD      . sodium chloride 0.9 % injection 10 mL  10 mL Intracatheter PRN [name], MD      . sodium chloride 0.9 % injection 3 mL  3 mL Intracatheter PRN [name], MD      . sodium chloride 0.9 % injection 3 mL  3 mL Intracatheter PRN [name], MD        Allergies  Allergen Reactions  . Diltiazem     Not able to tolerate due to SOB    History   Social History  . Marital Status: Married    Spouse Name: N/A    Number of Children: 3  . Years of Education: 10   Occupational History  . retired    Social History Main Topics  . Smoking status: Former Smoker -- 0.50 packs/day for 45 years    Types: Cigarettes    Quit date: 04/13/1992  . Smokeless tobacco: Current User    Types: Chew  . Alcohol Use: No  . Drug Use: No  . Sexual Activity: No   Other Topics Concern  . 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      Review of Systems: General: negative for chills, fever, night sweats or weight changes.  Cardiovascular: negative for chest pain, dyspnea on exertion, edema, orthopnea, palpitations, paroxysmal nocturnal dyspnea or shortness of breath Dermatological: negative for rash Respiratory: negative for cough or wheezing Urologic: negative for hematuria Abdominal: negative for nausea, vomiting, diarrhea, bright red blood per rectum, melena, or hematemesis Neurologic: negative for visual changes, syncope, or dizziness All other systems reviewed and are otherwise negative except as noted above.    Blood pressure 151/68, pulse 53, height  5\' 9"  (1.753 m), weight 179 lb 3.2 oz (81.285 kg).  General appearance: alert and no distress Neck: no adenopathy, no JVD, supple, symmetrical, trachea midline, thyroid not enlarged, symmetric, no tenderness/mass/nodules and bilateral carotid bruits versus transmitted murmur Lungs: clear to auscultation bilaterally Heart: typical aortic stenosis murmur Extremities: extremities normal, atraumatic, no cyanosis or edema  EKG not performed today  ASSESSMENT AND PLAN:   Aortic stenosis, moderate to severe Nov 2014 Digitate has severe aortic stenosis, moderate left ventricular  dysfunction, history of known CAD status post intervention 2 years ago and now progressive dyspnea on exertion. His most recent echo performed 09/11/13 revealed an ejection fraction of 45% with segmental wall motion abnormalities, valve area 0.93 cmand peak gradient of 58 mm of mercury.based on this, I decided to proceed with outpatient right and left heart cath at the time his anatomy physiology. I believe he's approaching the need for aortic valve replacement. This will be done on a nondialysis day as an outpatient.  HLD (hyperlipidemia) On statin therapy followed by his PCP  HTN (hypertension) Controlled on current medications      Runell GessJonathan J. Berry MD [facility]ic[facility] LLCFACP,FACC,FAHA, [facility] LtdFSCAI 10/06/2013 3:24 PM

## 2013-10-06 NOTE — Assessment & Plan Note (Signed)
On statin therapy followed by his PCP 

## 2013-10-06 NOTE — Patient Instructions (Signed)
Your physician has requested that you have a cardiac catheterization (right and left). Cardiac catheterization is used to diagnose and/or treat various heart conditions. Doctors may recommend this procedure for a number of different reasons. The most common reason is to evaluate chest pain. Chest pain can be a symptom of coronary artery disease (CAD), and cardiac catheterization can show whether plaque is narrowing or blocking your heart's arteries. This procedure is also used to evaluate the valves, as well as measure the blood flow and oxygen levels in different parts of your heart. For further information please visit [email or web address].   Following your catheterization, you will not be allowed to drive for 3 days.  No lifting, pushing, or pulling greater that 10 pounds is allowed for 1 week.  You will be required to have the following tests prior to the procedure:  1. Blood work-the blood work can be done no more than 7 days prior to the procedure.  [facility].  There is one downstairs on the first floor of this building and [facility] ([address])

## 2013-10-18 ENCOUNTER — Encounter (HOSPITAL_COMMUNITY): Payer: Self-pay | Admitting: Pharmacy Technician

## 2013-10-23 ENCOUNTER — Telehealth: Payer: Self-pay | Admitting: Cardiovascular Disease

## 2013-10-23 NOTE — Telephone Encounter (Signed)
[facility]  Voiced understanding.

## 2013-10-23 NOTE — Telephone Encounter (Signed)
Pt wants to know if he can have [facility] blood work and [facility] Wednesday?Please let him know asap.

## 2013-10-25 ENCOUNTER — Telehealth: Payer: Self-pay | Admitting: Cardiovascular Disease

## 2013-10-25 LAB — LIPID PANEL
CHOL/HDL RATIO: 1.8 ratio
Cholesterol: 70 mg/dL (ref 0–200)
HDL: 38 mg/dL — AB (ref >39)
LDL Cholesterol: 12 mg/dL (ref 0–99)
Triglycerides: 101 mg/dL (ref <150)
VLDL: 20 mg/dL (ref 0–40)

## 2013-10-25 LAB — PROTIME-INR
INR: 1.25 (ref <1.50)
PROTHROMBIN TIME: 15.7 s — AB (ref 11.6–15.2)

## 2013-10-25 LAB — CBC
HCT: 35 % — ABNORMAL LOW (ref 39.0–52.0)
Hemoglobin: 11.5 g/dL — ABNORMAL LOW (ref 13.0–17.0)
MCH: 35.7 pg — ABNORMAL HIGH (ref 26.0–34.0)
MCHC: 32.9 g/dL (ref 30.0–36.0)
MCV: 108.7 fL — AB (ref 78.0–100.0)
PLATELETS: 147 10*3/uL — AB (ref 150–400)
RBC: 3.22 MIL/uL — AB (ref 4.22–5.81)
RDW: 15 % (ref 11.5–15.5)
WBC: 4 10*3/uL (ref 4.0–10.5)

## 2013-10-25 LAB — BASIC METABOLIC PANEL
BUN: 20 mg/dL (ref 6–23)
CO2: 27 mEq/L (ref 19–32)
CREATININE: 5.81 mg/dL — AB (ref 0.50–1.35)
Calcium: 8.1 mg/dL — ABNORMAL LOW (ref 8.4–10.5)
Chloride: 98 mEq/L (ref 96–112)
Glucose, Bld: 113 mg/dL — ABNORMAL HIGH (ref 70–99)
POTASSIUM: 5.1 meq/L (ref 3.5–5.3)
Sodium: 140 mEq/L (ref 135–145)

## 2013-10-25 LAB — HEPATIC FUNCTION PANEL
ALBUMIN: 4.1 g/dL (ref 3.5–5.2)
ALT: 17 U/L (ref 0–53)
AST: 17 U/L (ref 0–37)
Alkaline Phosphatase: 157 U/L — ABNORMAL HIGH (ref 39–117)
Bilirubin, Direct: 0.3 mg/dL (ref 0.0–0.3)
Indirect Bilirubin: 0.4 mg/dL (ref 0.2–1.2)
TOTAL PROTEIN: 7.2 g/dL (ref 6.0–8.3)
Total Bilirubin: 0.7 mg/dL (ref 0.2–1.2)

## 2013-10-25 LAB — TSH: TSH: 6.567 u[IU]/mL — AB (ref 0.350–4.500)

## 2013-10-25 LAB — APTT: aPTT: 37 seconds (ref 24–37)

## 2013-10-25 NOTE — Telephone Encounter (Signed)
[facility] called and informed that pt does not need cxr

## 2013-10-25 NOTE — Telephone Encounter (Signed)
Message.

## 2013-10-26 ENCOUNTER — Telehealth: Payer: Self-pay | Admitting: *Deleted

## 2013-10-26 DIAGNOSIS — R7989 Other specified abnormal findings of blood chemistry: Secondary | ICD-10-CM

## 2013-10-26 DIAGNOSIS — R945 Abnormal results of liver function studies: Principal | ICD-10-CM

## 2013-10-26 NOTE — Telephone Encounter (Signed)
Lab slip mailed to patient for liver recheck in 6 weeks.

## 2013-10-26 NOTE — Telephone Encounter (Signed)
Message copied by Chauncy Lean on Thu Oct 26, 2013 11:56 AM ------      Message from: Lorretta Harp      Created: Wed Oct 25, 2013  5:07 PM       Excellent FLP. Not sure why Alk Phos elevated. Repeat LFTs 6 weeks ------

## 2013-10-30 ENCOUNTER — Encounter (HOSPITAL_COMMUNITY): Payer: Self-pay | Admitting: General Practice

## 2013-10-30 ENCOUNTER — Ambulatory Visit (HOSPITAL_COMMUNITY)
Admission: RE | Admit: 2013-10-30 | Discharge: 2013-10-31 | Disposition: A | Discharge status: Home / Self Care | Payer: Medicare Other | Source: Ambulatory Visit | Attending: Cardiovascular Disease | Admitting: Cardiovascular Disease

## 2013-10-30 ENCOUNTER — Encounter (HOSPITAL_COMMUNITY)
Admission: RE | Disposition: A | Discharge status: Home / Self Care | Payer: Self-pay | Source: Ambulatory Visit | Attending: Cardiovascular Disease

## 2013-10-30 DIAGNOSIS — I255 Ischemic cardiomyopathy: Secondary | ICD-10-CM | POA: Diagnosis present

## 2013-10-30 DIAGNOSIS — I120 Hypertensive chronic kidney disease with stage 5 chronic kidney disease or end stage renal disease: Secondary | ICD-10-CM | POA: Diagnosis not present

## 2013-10-30 DIAGNOSIS — D649 Anemia, unspecified: Secondary | ICD-10-CM | POA: Insufficient documentation

## 2013-10-30 DIAGNOSIS — R5381 Other malaise: Secondary | ICD-10-CM

## 2013-10-30 DIAGNOSIS — Q391 Atresia of esophagus with tracheo-esophageal fistula: Secondary | ICD-10-CM | POA: Insufficient documentation

## 2013-10-30 DIAGNOSIS — N2581 Secondary hyperparathyroidism of renal origin: Secondary | ICD-10-CM | POA: Insufficient documentation

## 2013-10-30 DIAGNOSIS — R001 Bradycardia, unspecified: Secondary | ICD-10-CM | POA: Diagnosis present

## 2013-10-30 DIAGNOSIS — N186 End stage renal disease: Secondary | ICD-10-CM | POA: Diagnosis not present

## 2013-10-30 DIAGNOSIS — E785 Hyperlipidemia, unspecified: Secondary | ICD-10-CM | POA: Diagnosis not present

## 2013-10-30 DIAGNOSIS — Z992 Dependence on renal dialysis: Secondary | ICD-10-CM

## 2013-10-30 DIAGNOSIS — M948X9 Other specified disorders of cartilage, unspecified sites: Secondary | ICD-10-CM | POA: Insufficient documentation

## 2013-10-30 DIAGNOSIS — K5520 Angiodysplasia of colon without hemorrhage: Secondary | ICD-10-CM

## 2013-10-30 DIAGNOSIS — I10 Essential (primary) hypertension: Secondary | ICD-10-CM | POA: Diagnosis present

## 2013-10-30 DIAGNOSIS — I25119 Atherosclerotic heart disease of native coronary artery with unspecified angina pectoris: Secondary | ICD-10-CM

## 2013-10-30 DIAGNOSIS — R0989 Other specified symptoms and signs involving the circulatory and respiratory systems: Secondary | ICD-10-CM | POA: Diagnosis present

## 2013-10-30 DIAGNOSIS — Q393 Congenital stenosis and stricture of esophagus: Secondary | ICD-10-CM

## 2013-10-30 DIAGNOSIS — R5383 Other fatigue: Secondary | ICD-10-CM

## 2013-10-30 DIAGNOSIS — I2789 Other specified pulmonary heart diseases: Secondary | ICD-10-CM | POA: Diagnosis not present

## 2013-10-30 DIAGNOSIS — K219 Gastro-esophageal reflux disease without esophagitis: Secondary | ICD-10-CM | POA: Insufficient documentation

## 2013-10-30 DIAGNOSIS — Z955 Presence of coronary angioplasty implant and graft: Secondary | ICD-10-CM

## 2013-10-30 DIAGNOSIS — I359 Nonrheumatic aortic valve disorder, unspecified: Secondary | ICD-10-CM | POA: Insufficient documentation

## 2013-10-30 DIAGNOSIS — Z87891 Personal history of nicotine dependence: Secondary | ICD-10-CM | POA: Insufficient documentation

## 2013-10-30 DIAGNOSIS — I2510 Atherosclerotic heart disease of native coronary artery without angina pectoris: Secondary | ICD-10-CM

## 2013-10-30 DIAGNOSIS — Z9861 Coronary angioplasty status: Secondary | ICD-10-CM | POA: Diagnosis not present

## 2013-10-30 DIAGNOSIS — I480 Paroxysmal atrial fibrillation: Secondary | ICD-10-CM | POA: Diagnosis present

## 2013-10-30 DIAGNOSIS — I259 Chronic ischemic heart disease, unspecified: Secondary | ICD-10-CM | POA: Diagnosis not present

## 2013-10-30 DIAGNOSIS — R0609 Other forms of dyspnea: Secondary | ICD-10-CM

## 2013-10-30 DIAGNOSIS — D689 Coagulation defect, unspecified: Secondary | ICD-10-CM

## 2013-10-30 DIAGNOSIS — I739 Peripheral vascular disease, unspecified: Secondary | ICD-10-CM | POA: Diagnosis present

## 2013-10-30 DIAGNOSIS — I252 Old myocardial infarction: Secondary | ICD-10-CM | POA: Diagnosis not present

## 2013-10-30 DIAGNOSIS — Z79899 Other long term (current) drug therapy: Secondary | ICD-10-CM

## 2013-10-30 DIAGNOSIS — R0600 Dyspnea, unspecified: Secondary | ICD-10-CM | POA: Diagnosis present

## 2013-10-30 DIAGNOSIS — I350 Nonrheumatic aortic (valve) stenosis: Secondary | ICD-10-CM | POA: Diagnosis present

## 2013-10-30 DIAGNOSIS — I4510 Unspecified right bundle-branch block: Secondary | ICD-10-CM | POA: Diagnosis present

## 2013-10-30 HISTORY — DX: Pneumonia, unspecified organism: J18.9

## 2013-10-30 HISTORY — DX: Gastrointestinal hemorrhage, unspecified: K92.2

## 2013-10-30 HISTORY — PX: ABDOMINAL AORTAGRAM: SHX5454

## 2013-10-30 HISTORY — PX: CARDIAC CATHETERIZATION: SHX172

## 2013-10-30 HISTORY — PX: LEFT AND RIGHT HEART CATHETERIZATION WITH CORONARY ANGIOGRAM: SHX5449

## 2013-10-30 HISTORY — DX: Personal history of other medical treatment: Z92.89

## 2013-10-30 LAB — POCT I-STAT 3, VENOUS BLOOD GAS (G3P V)
ACID-BASE EXCESS: 1 mmol/L (ref 0.0–2.0)
Bicarbonate: 26.2 mEq/L — ABNORMAL HIGH (ref 20.0–24.0)
O2 Saturation: 64 %
TCO2: 28 mmol/L (ref 0–100)
pCO2, Ven: 44.4 mmHg — ABNORMAL LOW (ref 45.0–50.0)
pH, Ven: 7.379 — ABNORMAL HIGH (ref 7.250–7.300)
pO2, Ven: 34 mmHg (ref 30.0–45.0)

## 2013-10-30 LAB — MRSA PCR SCREENING: MRSA BY PCR: NEGATIVE

## 2013-10-30 LAB — POCT I-STAT 3, ART BLOOD GAS (G3+)
Bicarbonate: 24.8 mEq/L — ABNORMAL HIGH (ref 20.0–24.0)
O2 Saturation: 92 %
PCO2 ART: 40 mmHg (ref 35.0–45.0)
TCO2: 26 mmol/L (ref 0–100)
pH, Arterial: 7.401 (ref 7.350–7.450)
pO2, Arterial: 62 mmHg — ABNORMAL LOW (ref 80.0–100.0)

## 2013-10-30 SURGERY — LEFT AND RIGHT HEART CATHETERIZATION WITH CORONARY ANGIOGRAM
Anesthesia: LOCAL

## 2013-10-30 MED ORDER — SEVELAMER CARBONATE 800 MG PO TABS
2400.0000 mg | ORAL_TABLET | Freq: Three times a day (TID) | ORAL | Status: DC
  Administered 2013-10-31: 12:00:00 2400 mg via ORAL
  Filled 2013-10-30 (×6): qty 3

## 2013-10-30 MED ORDER — RENA-VITE PO TABS
1.0000 | ORAL_TABLET | Freq: Every day | ORAL | Status: DC
  Administered 2013-10-30: 21:00:00 via ORAL
  Filled 2013-10-30 (×2): qty 1

## 2013-10-30 MED ORDER — HYDRALAZINE HCL 20 MG/ML IJ SOLN: 10.0000 mg | INTRAMUSCULAR | Status: DC | PRN

## 2013-10-30 MED ORDER — SODIUM CHLORIDE 0.9 % IV SOLN: INTRAVENOUS | Status: DC

## 2013-10-30 MED ORDER — PANTOPRAZOLE SODIUM 40 MG PO TBEC
40.0000 mg | DELAYED_RELEASE_TABLET | Freq: Every day | ORAL | Status: DC
  Administered 2013-10-31: 40 mg via ORAL
  Filled 2013-10-30: qty 1

## 2013-10-30 MED ORDER — PAROXETINE HCL 20 MG PO TABS
20.0000 mg | ORAL_TABLET | Freq: Every day | ORAL | Status: DC
  Administered 2013-10-31: 20 mg via ORAL
  Filled 2013-10-30: qty 1

## 2013-10-30 MED ORDER — PRASUGREL HCL 10 MG PO TABS: 10.0000 mg | ORAL_TABLET | Freq: Every day | ORAL | Status: DC

## 2013-10-30 MED ORDER — NITROGLYCERIN 0.4 MG SL SUBL: 0.4000 mg | SUBLINGUAL_TABLET | SUBLINGUAL | Status: DC | PRN

## 2013-10-30 MED ORDER — LIDOCAINE HCL (PF) 1 % IJ SOLN
INTRAMUSCULAR | Status: AC
  Filled 2013-10-30: qty 30

## 2013-10-30 MED ORDER — MORPHINE SULFATE 2 MG/ML IJ SOLN
1.0000 mg | INTRAMUSCULAR | Status: DC | PRN
  Administered 2013-10-30: 1 mg via INTRAVENOUS
  Filled 2013-10-30: qty 1

## 2013-10-30 MED ORDER — MIDAZOLAM HCL 2 MG/2ML IJ SOLN
INTRAMUSCULAR | Status: AC
  Filled 2013-10-30: qty 2

## 2013-10-30 MED ORDER — ASPIRIN 81 MG PO CHEW: 81.0000 mg | CHEWABLE_TABLET | ORAL | Status: DC

## 2013-10-30 MED ORDER — SODIUM CHLORIDE 0.9 % IV SOLN
INTRAVENOUS | Status: AC
  Administered 2013-10-30: 11:00:00 via INTRAVENOUS

## 2013-10-30 MED ORDER — HEPARIN (PORCINE) IN NACL 2-0.9 UNIT/ML-% IJ SOLN
INTRAMUSCULAR | Status: AC
  Filled 2013-10-30: qty 1000

## 2013-10-30 MED ORDER — ASPIRIN 81 MG PO CHEW
81.0000 mg | CHEWABLE_TABLET | Freq: Every day | ORAL | Status: DC
  Administered 2013-10-31: 12:00:00 81 mg via ORAL
  Filled 2013-10-30 (×2): qty 1

## 2013-10-30 MED ORDER — METOPROLOL TARTRATE 12.5 MG HALF TABLET
12.5000 mg | ORAL_TABLET | Freq: Two times a day (BID) | ORAL | Status: DC
  Administered 2013-10-30 – 2013-10-31 (×2): 12.5 mg via ORAL
  Filled 2013-10-30 (×3): qty 1

## 2013-10-30 MED ORDER — ASPIRIN 81 MG PO CHEW
CHEWABLE_TABLET | ORAL | Status: AC
  Administered 2013-10-30: 08:00:00
  Filled 2013-10-30: qty 1

## 2013-10-30 MED ORDER — PRASUGREL HCL 10 MG PO TABS
10.0000 mg | ORAL_TABLET | Freq: Every day | ORAL | Status: DC
  Administered 2013-10-30 – 2013-10-31 (×2): 10 mg via ORAL
  Filled 2013-10-30 (×2): qty 1

## 2013-10-30 MED ORDER — CALCIUM CARBONATE-VITAMIN D 500-200 MG-UNIT PO TABS
1.0000 | ORAL_TABLET | Freq: Every day | ORAL | Status: DC
  Administered 2013-10-30 – 2013-10-31 (×2): 1 via ORAL
  Filled 2013-10-30 (×2): qty 1

## 2013-10-30 MED ORDER — ONDANSETRON HCL 4 MG/2ML IJ SOLN: 4.0000 mg | Freq: Four times a day (QID) | INTRAMUSCULAR | Status: DC | PRN

## 2013-10-30 MED ORDER — AMIODARONE HCL 100 MG PO TABS
100.0000 mg | ORAL_TABLET | Freq: Every day | ORAL | Status: DC
  Administered 2013-10-30: 23:00:00 100 mg via ORAL
  Filled 2013-10-30 (×2): qty 1

## 2013-10-30 MED ORDER — ALLOPURINOL 100 MG PO TABS
100.0000 mg | ORAL_TABLET | Freq: Every day | ORAL | Status: DC
  Administered 2013-10-31: 100 mg via ORAL
  Filled 2013-10-30 (×2): qty 1

## 2013-10-30 MED ORDER — SODIUM CHLORIDE 0.9 % IJ SOLN: 3.0000 mL | INTRAMUSCULAR | Status: DC | PRN

## 2013-10-30 MED ORDER — CINACALCET HCL 30 MG PO TABS
30.0000 mg | ORAL_TABLET | ORAL | Status: DC
  Administered 2013-10-31: 12:00:00 30 mg via ORAL
  Filled 2013-10-30: qty 1

## 2013-10-30 MED ORDER — FENTANYL CITRATE 0.05 MG/ML IJ SOLN
INTRAMUSCULAR | Status: AC
  Filled 2013-10-30: qty 2

## 2013-10-30 MED ORDER — ISOSORBIDE MONONITRATE ER 30 MG PO TB24
30.0000 mg | ORAL_TABLET | Freq: Every day | ORAL | Status: DC
  Administered 2013-10-30: 23:00:00 30 mg via ORAL
  Filled 2013-10-30 (×2): qty 1

## 2013-10-30 MED ORDER — ACETAMINOPHEN 325 MG PO TABS: 650.0000 mg | ORAL_TABLET | ORAL | Status: DC | PRN

## 2013-10-30 NOTE — Consult Note (Signed)
Indication for Consultation:  Management of ESRD/hemodialysis; anemia, hypertension/volume and secondary hyperparathyroidism  HPI: Roger [name] is a 73 y.o. male admitted after cardiac cath this AM, for progressive SOB adn EF of 45%. He recieves HD TTS @ [place]. Cath showed noncritical CAD, moderate LV dysfunction, mild-mod aortic stenosis and severe pulm HTN. Will arrange for HD tomorrow then DC.   Past Medical History  Diagnosis Date  . HTN (hypertension)   . Arthritis   . Hypercholesteremia   . MI (myocardial infarction) 03/16/2011    Inferolateral ST segment elevation MI  . GERD (gastroesophageal reflux disease)   . Atrial fibrillation     Paroxysmal - 2 episodes related to ischemic NSTEMI events  5 & 09/2011  . Gout   . S/P coronary artery stent placement, 10/01/11 with DES to mid LAD and  PTCA of Diag 2 10/01/2009    Inferior STEMI 2011 - BMS to LCx; NSTEMI 5/13 with ISR/thrombosis of Cx BMS -->multiple DES to  LCx ; staged PCI of LAD /Balloon PTCA of major Diag 09/2011 for Sx (NSTEMI)   . Anemia   . Schatzki's ring   . Angiodysplasia of intestine   . Aortic stenosis, moderate     2D Echocardiogram 12/13 and 08/22/11 showed moderate aortic stenosis with valve area of 1.28 sq cm. EF of 45 to 50% with severe hypokinesia of the entire inferolateral myocardium  . ESRD on hemodialysis     [facility], TTS, esrd due to HTN  . Right renal artery stenosis     renal stent in 1998  . Ulcer   . History of tobacco abuse quit 1994   Past Surgical History  Procedure Laterality Date  . Ureteral stent placement    . Colonoscopy  04/2009    with EGD by Dr.Buccini moderately large hiatal hernia could contribute to his anemia,Schatzki ring which might accout for his intermittent dysphagia/+internal hemorrhoids ;family hx of colon ca  . Esophagogastroduodenoscopy  03/2010    by Dr.Magod/small hiatal hernia with widley patent fibrous ring,minimal bulbitis otherwise nl  . Dialysis fistula  creation    . Coronary angioplasty with stent placement  08/2011 & 09/2011    Integrity Resolute DES Stents  Stent #1:  2.10mm x 14 mm (extending to just distal to the bifurcation to the mid OM, Stent #2:  2.19mm x 18mm  Stent #3:  3.76mm x 18mm; covering proximal portion of the old stent into the proximal circumflex and overlapping stent #2 distally, Stent #4: 2.29mm x 8mm (4 overlapping in LCx- OM1; 1 in mid LAD; PTCA only of Major Diag (small caliber)  . Coronary angioplasty with stent placement  2011, 2012    Dr [name]  03/16/11  re-cannulized totally occluded circumflex, stented with Integrity bare metal stent focal 90% stenosis in LAD and diagonal branch   . Givens capsule study  04/16/10    SB erosions vs. AVMs, non-specific distal SB inflammation in setting ASA, plavix, NSAIDs  . Esophagogastroduodenoscopy  03/14/2012    Procedure: ESOPHAGOGASTRODUODENOSCOPY (EGD);  Surgeon: Iva Boop, MD;  Location[facility] ENDOSCOPY;  Service: Endoscopy;  Laterality: N/A;  . Colonoscopy  03/16/2012    Procedure: COLONOSCOPY;  Surgeon: Iva Boop, MD;  Location: [facility] ENDOSCOPY;  Service: Endoscopy;  Laterality: N/A;  . Right renal artery, pta and stenting  08/22/1996    Dr [name], P-154 Palmaz stent mounted on a 6mm x 2cm PowerFlex and deployed in the renal ostium at 7 atmospheres  reduction of 95%  lesion to 0% residual without dissection    . Enteroscopy N/A 07/06/2012    Procedure: ENTEROSCOPY;  Surgeon: Hilarie FredricksonJohn N Perry, MD;  [facility];  Service: Endoscopy;  Laterality: N/A;  . Doppler echocardiography  03/15/2012-[facility]    LV 45-50%, grade 2 diastolic dysfunction; mod AV stenosis; calcified MV annulus with mild MR; PA pressure 64mmHg  . Nm myocar perf wall motion  05/02/2010    EF 49% ; mod perfusion basal inferolateral, basal anterolateral,mid inferolat and mid anterolateral region  . Carotid doppler  04/20/2011    lright common iliac artery0-49%,left common iliac artery 0-49%   . Renal angigram  01/02/2004    C02 angiography-widely patent renal arteries  . Event monitor  09/02/2011-6/20/2/13    reason afib   Family History  Problem Relation Age of Onset  . Coronary artery disease Father   . Hypertension Mother   . Coronary artery disease Mother   . Colon cancer Brother 1957   Social History:  reports that he quit smoking about 21 years ago. His smoking use included Cigarettes. He has a 22.5 pack-year smoking history. His smokeless tobacco use includes Chew. He reports that he does not drink alcohol or use illicit drugs. Allergies  Allergen Reactions  . Diltiazem     Not able to tolerate due to SOB   Prior to Admission medications   Medication Sig Start Date End Date Taking? Authorizing Anju Sereno  allopurinol (ZYLOPRIM) 100 MG tablet Take 100 mg by mouth daily.    Yes Historical Garhett Bernhard, MD  amiodarone (PACERONE) 200 MG tablet Take 100 mg by mouth at bedtime.   Yes Historical Tijah Hane, MD  calcium-vitamin D (OSCAL WITH D) 500-200 MG-UNIT per tablet Take 1 tablet by mouth daily with breakfast.   Yes Historical Fatoumata Albaugh, MD  cinacalcet (SENSIPAR) 30 MG tablet Take 30 mg by mouth every other day.    Yes Historical Dalbert Stillings, MD  isosorbide mononitrate (IMDUR) 30 MG 24 hr tablet Take 30 mg by mouth at bedtime.   Yes Historical Eulalia Ellerman, MD  metoprolol tartrate (LOPRESSOR) 25 MG tablet Take 12.5 mg by mouth 2 (two) times daily.   Yes Historical Levina Boyack, MD  multivitamin (RENA-VIT) TABS tablet Take 1 tablet by mouth every morning.    Yes Historical Devika Dragovich, MD  pantoprazole (PROTONIX) 40 MG tablet Take 40 mg by mouth daily.   Yes Historical Braelyn Jenson, MD  PARoxetine (PAXIL) 20 MG tablet Take 20 mg by mouth daily.   Yes Historical Radford Pease, MD  prasugrel (EFFIENT) 10 MG TABS tablet Take 10 mg by mouth daily.   Yes Historical Arlinda Barcelona, MD  rosuvastatin (CRESTOR) 10 MG tablet Take 10 mg by mouth daily.   Yes Historical Abdulahad Mederos, MD  sevelamer (RENVELA) 800 MG tablet  Take 2,400 mg by mouth 3 (three) times daily with meals.   Yes Historical Estiven Kohan, MD  nitroGLYCERIN (NITROSTAT) 0.4 MG SL tablet Place 0.4 mg under the tongue every 5 (five) minutes as needed for chest pain.    Historical Javoris Star, MD   Current Facility-Administered Medications  Medication Dose Route Frequency Jahmire Ruffins Last Rate Last Dose  . 0.9 %  sodium chloride infusion   Intravenous Continuous Runell GessJonathan J Berry, MD 50 mL/hr at 10/30/13 1100    . acetaminophen (TYLENOL) tablet 650 mg  650 mg Oral Q4H PRN Runell GessJonathan J Berry, MD      . Melene Muller[START ON 10/31/2013] allopurinol (ZYLOPRIM) tablet 100 mg  100 mg Oral Daily Runell GessJonathan J Berry, MD      .  amiodarone (PACERONE) tablet 100 mg  100 mg Oral QHS Runell Gess, MD      . Melene Muller ON 10/31/2013] aspirin chewable tablet 81 mg  81 mg Oral Daily Runell Gess, MD      . Melene Muller ON 10/31/2013] calcium-vitamin D (OSCAL WITH D) 500-200 MG-UNIT per tablet 1 tablet  1 tablet Oral Q breakfast Runell Gess, MD      . Melene Muller ON 10/31/2013] cinacalcet (SENSIPAR) tablet 30 mg  30 mg Oral QODAY Runell Gess, MD      . hydrALAZINE (APRESOLINE) injection 10 mg  10 mg Intravenous Q4H PRN Runell Gess, MD      . isosorbide mononitrate (IMDUR) 24 hr tablet 30 mg  30 mg Oral QHS Runell Gess, MD      . metoprolol tartrate (LOPRESSOR) tablet 12.5 mg  12.5 mg Oral BID Runell Gess, MD      . morphine 2 MG/ML injection 1 mg  1 mg Intravenous Q1H PRN Runell Gess, MD      . nitroGLYCERIN (NITROSTAT) SL tablet 0.4 mg  0.4 mg Sublingual Q5 min PRN Runell Gess, MD      . ondansetron [facility]) injection 4 mg  4 mg Intravenous Q6H PRN Runell Gess, MD      . Melene Muller ON 10/31/2013] pantoprazole (PROTONIX) EC tablet 40 mg  40 mg Oral Daily Runell Gess, MD      . Melene Muller ON 10/31/2013] PARoxetine (PAXIL) tablet 20 mg  20 mg Oral Daily Runell Gess, MD      . prasugrel (EFFIENT) tablet 10 mg  10 mg Oral Daily Runell Gess, MD      . sevelamer  carbonate (RENVELA) tablet 2,400 mg  2,400 mg Oral TID WC Runell Gess, MD       Facility-Administered Medications Ordered in Other Encounters  Medication Dose Route Frequency Adessa Primiano Last Rate Last Dose  . 0.9 %  sodium chloride infusion  250 mL Intravenous Once [name], MD      . 0.9 %  sodium chloride infusion  250 mL Intravenous Once [name], MD      . heparin lock flush 100 unit/mL  500 Units Intracatheter Daily PRN [name], MD      . heparin lock flush 100 unit/mL  250 Units Intracatheter PRN [name], MD      . heparin lock flush 100 unit/mL  500 Units Intracatheter Daily PRN [name], MD      . heparin lock flush 100 unit/mL  250 Units Intracatheter PRN [name], MD      . sodium chloride 0.9 % injection 10 mL  10 mL Intracatheter PRN [name], MD      . sodium chloride 0.9 % injection 10 mL  10 mL Intracatheter PRN [name], MD      . sodium chloride 0.9 % injection 3 mL  3 mL Intracatheter PRN [name], MD      . sodium chloride 0.9 % injection 3 mL  3 mL Intracatheter PRN [name], MD       Labs: Basic Metabolic Panel:  Recent Labs Lab 10/25/13 0758  NA 140  K 5.1  CL 98  CO2 27  GLUCOSE 113*  BUN 20  CREATININE 5.81*  CALCIUM 8.1*   Liver Function Tests:  Recent Labs Lab 10/25/13 0756  AST 17  ALT 17  ALKPHOS  157*  BILITOT 0.7  PROT 7.2  ALBUMIN 4.1   No results found for this basename: LIPASE, AMYLASE,  in the last 168 hours No results found for this basename: AMMONIA,  in the last 168 hours CBC:  Recent Labs Lab 10/25/13 0758  WBC 4.0  HGB 11.5*  HCT 35.0*  MCV 108.7*  PLT 147*   Cardiac Enzymes: No results found for this basename: CKTOTAL, CKMB, CKMBINDEX, TROPONINI,  in the last 168 hours CBG: No results found for this basename: GLUCAP,  in the last 168 hours Iron Studies: No results found for this basename: IRON, TIBC, TRANSFERRIN, FERRITIN,  in the last 72  hours Studies/Results: No results found.   Review of Systems: Negative except for SOB which is now improved s/p cath. Denies chest pain. Tired but feeling well.   Physical Exam: Filed Vitals:   10/30/13 0737 10/30/13 0946  BP: 138/89   Pulse: 97 83  Temp: 97.8 F (36.6 C)   TempSrc: Oral   Resp: 18   Height: 5\' 9"  (1.753 m)   Weight: 77.111 kg (170 lb)   SpO2: 95%      General: Drowsy, easily arousable but falls asleep during exam Head: Normocephalic, atraumatic, sclera non-icteric, mucus membranes are moist Neck: Supple. JVD not elevated. No carotid bruits Lungs: Clear bilaterally to auscultation without wheezes, rales, or rhonchi. Breathing is unlabored. Heart: RRR with S1 S2. SEM Abdomen: Soft, non-tender, non-distended with normoactive bowel sounds. No rebound/guarding. No obvious abdominal masses. M-S:  Strength and tone appear normal for age. Lower extremities:without edema or ischemic changes, no open wounds. R groin cath site dressing intact Neuro: Alert and oriented X 3. Moves all extremities spontaneously. Psych:  Responds to questions appropriately with a normal affect. Dialysis Access: LUA AVF +bruit/thrill  Dialysis Orders:  TTS @ [place]  3hr 15 mins  80.5kgs  2K/2.5Ca+ NO Heparin  LUa AVF 400/800   hectorol 1 mcg IV/HD Aranesp 60  Units IV/HD  No Fe  Assessment/Plan:  Arortic stenosis-  S/p cath 7/20 with Dr [name]. noncritical CAD, moderate LV dysfunction, mild to moderate aortic stenosis and severe pulmonary hypertension. DC tomorrow, follow up in 2 weeks 1. ESRD -  TTS @ [place]. HD tomorrow via AVF 2.  Hypertension/volume  - 138/89 unsure of home meds, states daughter does his meds for him.  under edw here and left under saturday, lower at dc 3. afib- Amiodarone and metop 4.  Anemia  - hgb 11.5 (7/15) cont ESA outpt.  5.  Metabolic bone disease - corrected Ca+8.3 sensipar, renvela. Last PTH 178. Phos 2.6 6.  Nutrition - renal diet, multivit. Alb at  goal  Jetty Duhamel, NP [facility] [phone] 10/30/2013, 12:21 PM    Patient seen and examined, agree with above note with above modifications. ESRD patient with HTN and known history of CAD- he is admitted for an elective cardiac cath for SOB which did not show any critical stenosis.  We are asked to arrange HD for AM prior to discharge home Annie Sable, MD 10/30/2013   \

## 2013-10-30 NOTE — H&P (View-Only) (Signed)
   10/06/2013 Roger [name]   02/27/1941  [id number]  Primary Physician MCINNIS,ANGUS G, MD Primary Cardiologist: [name] MD FACP,FACC,FAHA, FSCAI   HPI:  He is a 73-year-old married Caucasian male father of 3, grandfather of 4 grandchildren, accompanied by his daughter today. I saw him approximately 3 weeks ago. He has a history of ischemic heart disease, status post multiple interventions in the past. He also has chronic renal insufficiency, on hemodialysis, hypertension, and hyperlipidemia. He has had AFib in the past and has mild to moderate aortic stenosis. He was on aspirin, Effient, and Coumadin and had a GI bleed and had endoscopy by Dr. Gessner from above and below with no source found. Ultimately he was transfused 4 units of packed red blood cells. His hemoglobin improved and his bleeding resolved spontaneously. He was taken off his aspirin and his Coumadin, maintaining sinus rhythm.I last saw him in the office in December 2013. He was admitted in March with recurrent GI bleed. His antiplatelet agent was held for one month and restarted. He denies chest pain or shortness of breath. He does have a recent 2-D echo was suggested progression of his aortic stenosis now with a valve area of 0.8 cm.since I saw him 6 months ago he has been relatively asymptomatic specifically denying chest pain or shortness of breath. Recent lower extremity artery Doppler studies showed normal ABIs with progression of his disease in the iliac arteries. Since I saw him in the office 05/19/13 he has been admitted with pneumonia and was in atrial fibrillation. He was placed on diltiazem by Dr. McDowell. Unfortunately, he was not able to tolerate this because of bradycardia and hypotension especially during hemodialysis. He does complain of increasing dyspnea on exertion. I suspect this is multifactorial with progression of his aortic stenosis.recent 2-D echocardiogram performed several weeks ago revealed an  ejection fraction of 45%, segmental wall motion abnormalities and severe aortic stenosis with a valve area of 0.93 cm I. The peak gradient of 58 mmHg.    Current Outpatient Prescriptions  Medication Sig Dispense Refill  . allopurinol (ZYLOPRIM) 100 MG tablet Take 100 mg by mouth 2 (two) times daily.       . amiodarone (PACERONE) 200 MG tablet Take 100 mg by mouth at bedtime.      . calcium-vitamin D (OSCAL WITH D) 500-200 MG-UNIT per tablet Take 1 tablet by mouth daily with breakfast.      . cinacalcet (SENSIPAR) 30 MG tablet Take 30 mg by mouth every other day.       . isosorbide mononitrate (IMDUR) 30 MG 24 hr tablet Take 1 tablet (30 mg total) by mouth at bedtime.  90 tablet  3  . metoprolol tartrate (LOPRESSOR) 25 MG tablet Take 0.5 tablets (12.5 mg total) by mouth 2 (two) times daily.  90 tablet  3  . multivitamin (RENA-VIT) TABS tablet Take 1 tablet by mouth every morning.       . nitroGLYCERIN (NITROSTAT) 0.4 MG SL tablet Place 1 tablet (0.4 mg total) under the tongue every 5 (five) minutes as needed.  25 tablet  5  . pantoprazole (PROTONIX) 40 MG tablet Take 1 tablet (40 mg total) by mouth daily before breakfast.      . PARoxetine (PAXIL) 20 MG tablet Take 20 mg by mouth daily.      . prasugrel (EFFIENT) 10 MG TABS tablet Take 1 tablet (10 mg total) by mouth daily.  90 tablet  3  . rosuvastatin (CRESTOR) 10   MG tablet Take 1 tablet (10 mg total) by mouth at bedtime.  90 tablet  3  . sevelamer (RENVELA) 800 MG tablet Take 2,400 mg by mouth 3 (three) times daily with meals.       No current facility-administered medications for this visit.   Facility-Administered Medications Ordered in Other Visits  Medication Dose Route Frequency Provider Last Rate Last Dose  . 0.9 %  sodium chloride infusion  250 mL Intravenous Once Alvin C Powell, MD      . 0.9 %  sodium chloride infusion  250 mL Intravenous Once Alvin C Powell, MD      . heparin lock flush 100 unit/mL  500 Units Intracatheter Daily  PRN Alvin C Powell, MD      . heparin lock flush 100 unit/mL  250 Units Intracatheter PRN Alvin C Powell, MD      . heparin lock flush 100 unit/mL  500 Units Intracatheter Daily PRN Alvin C Powell, MD      . heparin lock flush 100 unit/mL  250 Units Intracatheter PRN Alvin C Powell, MD      . sodium chloride 0.9 % injection 10 mL  10 mL Intracatheter PRN Alvin C Powell, MD      . sodium chloride 0.9 % injection 10 mL  10 mL Intracatheter PRN Alvin C Powell, MD      . sodium chloride 0.9 % injection 3 mL  3 mL Intracatheter PRN Alvin C Powell, MD      . sodium chloride 0.9 % injection 3 mL  3 mL Intracatheter PRN Alvin C Powell, MD        Allergies  Allergen Reactions  . Diltiazem     Not able to tolerate due to SOB    History   Social History  . Marital Status: Married    Spouse Name: N/A    Number of Children: 3  . Years of Education: 10   Occupational History  . retired    Social History Main Topics  . Smoking status: Former Smoker -- 0.50 packs/day for 45 years    Types: Cigarettes    Quit date: 04/13/1992  . Smokeless tobacco: Current User    Types: Chew  . Alcohol Use: No  . Drug Use: No  . Sexual Activity: No   Other Topics Concern  . Not on file   Social History Narrative   Occupation: raised [employer], worked for [employer] as well.      Review of Systems: General: negative for chills, fever, night sweats or weight changes.  Cardiovascular: negative for chest pain, dyspnea on exertion, edema, orthopnea, palpitations, paroxysmal nocturnal dyspnea or shortness of breath Dermatological: negative for rash Respiratory: negative for cough or wheezing Urologic: negative for hematuria Abdominal: negative for nausea, vomiting, diarrhea, bright red blood per rectum, melena, or hematemesis Neurologic: negative for visual changes, syncope, or dizziness All other systems reviewed and are otherwise negative except as noted above.    Blood pressure 151/68, pulse 53, height  5' 9" (1.753 m), weight 179 lb 3.2 oz (81.285 kg).  General appearance: alert and no distress Neck: no adenopathy, no JVD, supple, symmetrical, trachea midline, thyroid not enlarged, symmetric, no tenderness/mass/nodules and bilateral carotid bruits versus transmitted murmur Lungs: clear to auscultation bilaterally Heart: typical aortic stenosis murmur Extremities: extremities normal, atraumatic, no cyanosis or edema  EKG not performed today  ASSESSMENT AND PLAN:   Aortic stenosis, moderate to severe Nov 2014 [place] has severe aortic stenosis, moderate left ventricular   dysfunction, history of known CAD status post intervention 2 years ago and now progressive dyspnea on exertion. His most recent echo performed 09/11/13 revealed an ejection fraction of 45% with segmental wall motion abnormalities, valve area 0.93 cmand peak gradient of 58 mm of mercury.based on this, I decided to proceed with outpatient right and left heart cath at the time his anatomy physiology. I believe he's approaching the need for aortic valve replacement. This will be done on a nondialysis day as an outpatient.  HLD (hyperlipidemia) On statin therapy followed by his PCP  HTN (hypertension) Controlled on current medications      [name] MD FACP,FACC,FAHA, FSCAI 10/06/2013 3:24 PM  

## 2013-10-30 NOTE — Progress Notes (Signed)
Utilization Review Completed.[name] T7/20/2015  

## 2013-10-30 NOTE — Interval H&P Note (Signed)
Cath Lab Visit (complete for each Cath Lab visit)  Clinical Evaluation Leading to the Procedure:   ACS: No.  Non-ACS:    Anginal Classification: CCS III  Anti-ischemic medical therapy: No Therapy  Non-Invasive Test Results: No non-invasive testing performed  Prior CABG: No previous CABG      History and Physical Interval Note:  10/30/2013 10:01 AM  Roger [name]  has presented today for surgery, with the diagnosis of aortic stenosis  The various methods of treatment have been discussed with the patient and family. After consideration of risks, benefits and other options for treatment, the patient has consented to  Procedure(s): LEFT AND RIGHT HEART CATHETERIZATION WITH CORONARY ANGIOGRAM (N/A) as a surgical intervention .  The patient's history has been reviewed, patient examined, no change in status, stable for surgery.  I have reviewed the patient's chart and labs.  Questions were answered to the patient's satisfaction.     Runell GessBERRY,[name]

## 2013-10-30 NOTE — CV Procedure (Signed)
Roger [name] is a 73 y.o. male    [id number] LOCATION:  FACILITY: [facility]  PHYSICIAN: Roger [name], M.D. [birth year]   DATE OF PROCEDURE:  10/30/2013  DATE OF DISCHARGE:     CARDIAC CATHETERIZATION     History obtained from chart review.He is a 73 year old married Caucasian male father of 3, grandfather of 4 grandchildren, accompanied by his daughter today. I saw him approximately 3 weeks ago. He has a history of ischemic heart disease, status post multiple interventions in the past. He also has chronic renal insufficiency, on hemodialysis, hypertension, and hyperlipidemia. He has had AFib in the past and has mild to moderate aortic stenosis. He was on aspirin, Effient, and Coumadin and had a GI bleed and had endoscopy by Dr. Leone [name] from above and below with no source found. Ultimately he was transfused 4 units of packed red blood cells. His hemoglobin improved and his bleeding resolved spontaneously. He was taken off his aspirin and his Coumadin, maintaining sinus rhythm.I last saw him in the office in December 2013. He was admitted in March with recurrent GI bleed. His antiplatelet agent was held for one month and restarted. He denies chest pain or shortness of breath. He does have a recent 2-D echo was suggested progression of his aortic stenosis now with a valve area of 0.8 cm.since I saw him 6 months ago he has been relatively asymptomatic specifically denying chest pain or shortness of breath. Recent lower extremity artery Doppler studies showed normal ABIs with progression of his disease in the iliac arteries. Since I saw him in the office 05/19/13 he has been admitted with pneumonia and was in atrial fibrillation. He was placed on diltiazem by Dr. Diona [name]. Unfortunately, he was not able to tolerate this because of bradycardia and hypotension especially during hemodialysis. He does complain of increasing dyspnea on exertion. I suspect this is multifactorial with progression of his aortic  stenosis.recent 2-D echocardiogram performed several weeks ago revealed an ejection fraction of 45%, segmental wall motion abnormalities and severe aortic stenosis with a valve area of 0.93 cm I. The peak gradient of 58 mmHg. He presents today for outpatient right and left heart cath.    PROCEDURE DESCRIPTION:   The patient was brought to the second floor [facility] cath lab in the postabsorptive state. He was premedicated with Valium 5 mg by mouth, IV Versed and fentanyl. His right groinwas prepped and shaved in usual sterile fashion. Xylocaine 1% was used for local anesthesia. A 6 French sheath was inserted into the right common femoral artery using standard Seldinger technique.A 7 French sheath was inserted into the right common femoral vein. A 7 French balloon tipped  Swan-Ganz catheter was advanced through the right heart chambers attending sequential pressures and blood samples. 5 French right and left Judkins catheters along with a 5 French pigtail catheter  was used for selective coronary angiography, left ventriculography, distal abdominal aortography.  HEMODYNAMICS:    AO SYSTOLIC/AO DIASTOLIC: 141/87   LV SYSTOLIC/LV DIASTOLIC: 152/28  Right atrial pressure: 25/31, mean equals 28  Pulmonary artery pressure: 65/31, mean equals 45  Right ventricular pressure: 79/22, mean equals 25  Pulmonary capillary wedge pressure:27/41, mean 31  Cardiac output (Fick) equals 5.86 L per minute, 3.01 L per minute per meter squared  Cardiac output (thermodilution) equals 4.26 L per minute with a cardiac index of 2.21 L per minute per meter squared  Aortic valve gradient equals 13 mmHg  Aortic valve area equals 1.21cm  ANGIOGRAPHIC RESULTS:  1. Left main; 20-30% ostial, 40-50% distal  2. LAD; occluded second diagonal branch 3. Left circumflex; patent circumflex stent in the proximal AV groove, 80-90% stenosis in the distal posterolateral branches which were small.  4. Right  coronary artery; dominant and widely patent 5. Left ventriculography; RAO left ventriculogram was performed using  25 mL of Visipaque dye at 12 mL/second. The overall LVEF estimated  40-45 %  With wall motion abnormalities notable for moderate anteroapical hypokinesia  IMPRESSION:[name] has noncritical CAD, moderate LV dysfunction, mild to moderate aortic stenosis and severe pulmonary hypertension with elevated filling pressures and a high V wave. I'm going to keep him overnight, arrange for him to undergo hemodialysis tomorrow after which he can be discharged home with outpatient followup in one to 2 weeks with me. The sheaths were removed and pressure was held on the groin to achieve hemostasis. The patient left the lab in stable condition.  Roger [name],Roger Toulouse J. MD, [facility] 10/30/2013 10:59 AM

## 2013-10-31 DIAGNOSIS — I120 Hypertensive chronic kidney disease with stage 5 chronic kidney disease or end stage renal disease: Secondary | ICD-10-CM | POA: Diagnosis not present

## 2013-10-31 DIAGNOSIS — I359 Nonrheumatic aortic valve disorder, unspecified: Secondary | ICD-10-CM

## 2013-10-31 DIAGNOSIS — Z992 Dependence on renal dialysis: Secondary | ICD-10-CM | POA: Diagnosis not present

## 2013-10-31 DIAGNOSIS — N186 End stage renal disease: Secondary | ICD-10-CM

## 2013-10-31 DIAGNOSIS — I209 Angina pectoris, unspecified: Secondary | ICD-10-CM

## 2013-10-31 DIAGNOSIS — I4510 Unspecified right bundle-branch block: Secondary | ICD-10-CM | POA: Diagnosis present

## 2013-10-31 LAB — CBC
HCT: 35.2 % — ABNORMAL LOW (ref 39.0–52.0)
HEMOGLOBIN: 11.2 g/dL — AB (ref 13.0–17.0)
MCH: 35.4 pg — ABNORMAL HIGH (ref 26.0–34.0)
MCHC: 31.8 g/dL (ref 30.0–36.0)
MCV: 111.4 fL — ABNORMAL HIGH (ref 78.0–100.0)
Platelets: 139 10*3/uL — ABNORMAL LOW (ref 150–400)
RBC: 3.16 MIL/uL — ABNORMAL LOW (ref 4.22–5.81)
RDW: 15 % (ref 11.5–15.5)
WBC: 4.1 10*3/uL (ref 4.0–10.5)

## 2013-10-31 LAB — RENAL FUNCTION PANEL
Albumin: 3.4 g/dL — ABNORMAL LOW (ref 3.5–5.2)
Anion gap: 19 — ABNORMAL HIGH (ref 5–15)
BUN: 32 mg/dL — AB (ref 6–23)
CHLORIDE: 97 meq/L (ref 96–112)
CO2: 21 mEq/L (ref 19–32)
Calcium: 7.6 mg/dL — ABNORMAL LOW (ref 8.4–10.5)
Creatinine, Ser: 7.35 mg/dL — ABNORMAL HIGH (ref 0.50–1.35)
GFR calc Af Amer: 8 mL/min — ABNORMAL LOW (ref >90)
GFR calc non Af Amer: 7 mL/min — ABNORMAL LOW (ref >90)
Glucose, Bld: 107 mg/dL — ABNORMAL HIGH (ref 70–99)
POTASSIUM: 6.3 meq/L — AB (ref 3.7–5.3)
Phosphorus: 3.2 mg/dL (ref 2.3–4.6)
SODIUM: 137 meq/L (ref 137–147)

## 2013-10-31 MED ORDER — NEPRO/CARBSTEADY PO LIQD
237.0000 mL | ORAL | Status: DC | PRN
  Filled 2013-10-31: qty 237

## 2013-10-31 MED ORDER — SODIUM CHLORIDE 0.9 % IV SOLN: 100.0000 mL | INTRAVENOUS | Status: DC | PRN

## 2013-10-31 MED ORDER — LIDOCAINE-PRILOCAINE 2.5-2.5 % EX CREA
1.0000 "application " | TOPICAL_CREAM | CUTANEOUS | Status: DC | PRN
  Filled 2013-10-31: qty 5

## 2013-10-31 MED ORDER — LIDOCAINE HCL (PF) 1 % IJ SOLN: 5.0000 mL | INTRAMUSCULAR | Status: DC | PRN

## 2013-10-31 MED ORDER — HEPARIN SODIUM (PORCINE) 1000 UNIT/ML DIALYSIS: 1000.0000 [IU] | INTRAMUSCULAR | Status: DC | PRN

## 2013-10-31 MED ORDER — SODIUM CHLORIDE 0.9 % IV SOLN
100.0000 mL | INTRAVENOUS | Status: DC | PRN
Start: 2013-10-31 — End: 2013-10-31

## 2013-10-31 MED ORDER — ALTEPLASE 2 MG IJ SOLR
2.0000 mg | Freq: Once | INTRAMUSCULAR | Status: DC | PRN
  Filled 2013-10-31: qty 2

## 2013-10-31 MED ORDER — DOXERCALCIFEROL 4 MCG/2ML IV SOLN
1.0000 ug | INTRAVENOUS | Status: DC
  Administered 2013-10-31: 1 ug via INTRAVENOUS
  Filled 2013-10-31: qty 2

## 2013-10-31 MED ORDER — ACETAMINOPHEN 325 MG PO TABS: 650.0000 mg | ORAL_TABLET | ORAL | Status: AC | PRN

## 2013-10-31 MED ORDER — PENTAFLUOROPROP-TETRAFLUOROETH EX AERO: 1.0000 "application " | INHALATION_SPRAY | CUTANEOUS | Status: DC | PRN

## 2013-10-31 MED ORDER — ASPIRIN 81 MG PO CHEW: 81.0000 mg | CHEWABLE_TABLET | Freq: Every day | ORAL | Status: DC

## 2013-10-31 NOTE — Progress Notes (Signed)
Subjective:  Seen on HD , no complaints.  Plan is for discharge after HD Objective Vital signs in last 24 hours: Filed Vitals:   10/31/13 0800 10/31/13 0830 10/31/13 0900 10/31/13 0930  BP: 123/81 100/64 106/59 122/62  Pulse: 101 98 52 55  Temp:      TempSrc:      Resp:      Height:      Weight:      SpO2:       Weight change:   Intake/Output Summary (Last 24 hours) at 10/31/13 0941 Last data filed at 10/31/13 0538  Gross per 24 hour  Intake    755 ml  Output      0 ml  Net    755 ml    Dialysis Orders: TTS @ [place]  3hr 15 mins 80.5kgs 2K/2.5Ca+ NO Heparin LUa AVF 400/800  hectorol 1 mcg IV/HD Aranesp 60 Units IV/HD No Fe    Assessment/Plan:  Arortic stenosis- S/p cath 7/20 with Dr [name]. noncritical CAD, moderate LV dysfunction, mild to moderate aortic stenosis and severe pulmonary hypertension. DC today after HD, follow up in 2 weeks  1. ESRD - TTS @ [place].  via AVF 2. Hypertension/volume - 138/89 unsure of home meds, states daughter does his meds for him. under edw here and left under saturday, lower at dc- BP good on HD today 3. afib- Amiodarone and metop 4. Anemia - hgb 11.2  cont ESA outpt.  5. Metabolic bone disease - corrected Ca+8.3 sensipar, renvela. Last PTH 178. Phos 2.6 6. Nutrition - renal diet, multivit. Alb at goal 7.    Roger [name] A    Labs: Basic Metabolic Panel:  Recent Labs Lab 10/25/13 0758 10/31/13 0714  NA 140 137  K 5.1 6.3*  CL 98 97  CO2 27 21  GLUCOSE 113* 107*  BUN 20 32*  CREATININE 5.81* 7.35*  CALCIUM 8.1* 7.6*  PHOS  --  3.2   Liver Function Tests:  Recent Labs Lab 10/25/13 0756 10/31/13 0714  AST 17  --   ALT 17  --   ALKPHOS 157*  --   BILITOT 0.7  --   PROT 7.2  --   ALBUMIN 4.1 3.4*   No results found for this basename: LIPASE, AMYLASE,  in the last 168 hours No results found for this basename: AMMONIA,  in the last 168 hours CBC:  Recent Labs Lab 10/25/13 0758 10/31/13 0714  WBC  4.0 4.1  HGB 11.5* 11.2*  HCT 35.0* 35.2*  MCV 108.7* 111.4*  PLT 147* 139*   Cardiac Enzymes: No results found for this basename: CKTOTAL, CKMB, CKMBINDEX, TROPONINI,  in the last 168 hours CBG: No results found for this basename: GLUCAP,  in the last 168 hours  Iron Studies: No results found for this basename: IRON, TIBC, TRANSFERRIN, FERRITIN,  in the last 72 hours Studies/Results: No results found. Medications: Infusions:    Scheduled Medications: . allopurinol  100 mg Oral Daily  . amiodarone  100 mg Oral QHS  . aspirin  81 mg Oral Daily  . calcium-vitamin D  1 tablet Oral Q breakfast  . cinacalcet  30 mg Oral QODAY  . doxercalciferol  1 mcg Intravenous Q T,Th,Sa-HD  . isosorbide mononitrate  30 mg Oral QHS  . metoprolol tartrate  12.5 mg Oral BID  . multivitamin  1 tablet Oral QHS  . pantoprazole  40 mg Oral Daily  . PARoxetine  20 mg Oral Daily  . prasugrel  10  mg Oral Daily  . sevelamer carbonate  2,400 mg Oral TID WC    have reviewed scheduled and prn medications.  Physical Exam: General: NAD Heart: RRR Lungs: mostly clear Abdomen: non tender Extremities: no edema Dialysis Access: left upper arm AVF     10/31/2013,9:41 AM  LOS: 1 day

## 2013-10-31 NOTE — Discharge Instructions (Signed)
Angiogram, Care After °Refer to this sheet in the next few weeks. These instructions provide you with information on caring for yourself after your procedure. Your health care provider may also give you more specific instructions. Your treatment has been planned according to current medical practices, but problems sometimes occur. Call your health care provider if you have any problems or questions after your procedure.  °WHAT TO EXPECT AFTER THE PROCEDURE °After your procedure, it is typical to have the following sensations: °· Minor discomfort or tenderness and a small bump at the catheter insertion site. The bump should usually decrease in size and tenderness within 1 to 2 weeks. °· Any bruising will usually fade within 2 to 4 weeks. °HOME CARE INSTRUCTIONS  °· You may need to keep taking blood thinners if they were prescribed for you. Only take over-the-counter or prescription medicines for pain, fever, or discomfort as directed by your health care provider. °· Do not apply powder or lotion to the site. °· Do not sit in a bathtub, swimming pool, or whirlpool for 5 to 7 days. °· You may shower 24 hours after the procedure. Remove the bandage (dressing) and gently wash the site with plain soap and water. Gently pat the site dry. °· Inspect the site at least twice daily. °· Limit your activity for the first 48 hours. Do not bend, squat, or lift anything over 20 lb (9 kg) or as directed by your health care provider. °· Do not drive home if you are discharged the day of the procedure. Have someone else drive you. Follow instructions about when you can drive or return to work. °SEEK MEDICAL CARE IF: °· You get lightheaded when standing up. °· You have drainage (other than a small amount of blood on the dressing). °· You have chills. °· You have a fever. °· You have redness, warmth, swelling, or pain at the insertion site. °SEEK IMMEDIATE MEDICAL CARE IF:  °· You develop chest pain or shortness of breath, feel faint,  or pass out. °· You have bleeding, swelling larger than a walnut, or drainage from the catheter insertion site. °· You develop pain, discoloration, coldness, or severe bruising in the leg or arm that held the catheter. °· You develop bleeding from any other place, such as the bowels. You may see bright red blood in your urine or stools, or your stools may appear black and tarry. °· You have heavy bleeding from the site. If this happens, hold pressure on the site. °MAKE SURE YOU: °· Understand these instructions. °· Will watch your condition. °· Will get help right away if you are not doing well or get worse. °Document Released: 10/16/2004 Document Revised: 04/04/2013 Document Reviewed: 08/22/2012 °ExitCare® Patient Information ©2015 [employer]. This information is not intended to replace advice given to you by your health care provider. Make sure you discuss any questions you have with your health care provider. ° °

## 2013-10-31 NOTE — Procedures (Signed)
Patient was seen on dialysis and the procedure was supervised.  BFR 400  Via AVF BP is  122/62.   Patient appears to be tolerating treatment well  Roger [name] A 10/31/2013

## 2013-10-31 NOTE — Discharge Summary (Signed)
Patient ID: Roger [name],  MRN: [id number], DOB/AGE: 1941/03/21 73 y.o.  Admit date: 10/30/2013 Discharge date: 10/31/2013  Primary Care Provider: [name], MD Primary Cardiologist: Dr [name]  Discharge Diagnoses Principal Problem:   Dyspnea on exertion Active Problems:   S/P- DES to LCx-OM -5/13, BMS to LCx-OM; 10/01/11 with DES to mid LAD and  PTCA of Diag 2   End stage renal disease on dialysis; HD T, Th Sat   Aortic stenosis, moderate by cath 10/30/13    HTN (hypertension)   PAD (peripheral artery disease): 60% bilateral common iliac stenosis    HLD (hyperlipidemia)   Bradycardia when in NSR   GERD (gastroesophageal reflux disease)   PAF-NSR on Amiodarone, no Coumadin secondary to GI bleeding   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 He also has ESRD on hemodialysis, hypertension, and hyperlipidemia. He has had AFib in the past- NSR on Amiodarone, and has mild to moderate aortic stenosis. He was on aspirin, Effient, and Coumadin and had a GI bleed March 2014.  He was evaluated by Dr. Leone Payor from above and below with no source found. Ultimately he was transfused 4 units of packed red blood cells. His hemoglobin improved and his bleeding resolved spontaneously. He was taken off his aspirin and his Coumadin and has been maintaining sinus rhythm. He has had some DOE. A recent 2-D echo suggested progression of his aortic stenosis - now with a valve area of 0.8 cm. Dr [name]  Was concerned he had progression of his aortic stenosis. 2-D echocardiogram 09/08/13 revealed an ejection fraction of 45%, segmental wall motion abnormalities and severe aortic stenosis with a valve area of 0.93 cm I. The peak gradient of 58 mmHg. He was admitted for Rt and Lt heart cath  10/30/13. This revealed noncritical CAD, moderate LV dysfunction, mild to moderate aortic stenosis and severe pulmonary hypertension with elevated filling pressures. Dr [name] suggested continued medical Rx. He will be discharged after dialysis 10/31/13.     Discharge Vitals:  Blood pressure 119/60, pulse 55, temperature 98 F (36.7 C), temperature source Oral, resp. rate 18, height 5\' 9"  (1.753 m), weight 183 lb 6.8 oz (83.2 kg), SpO2 92.00%.    Labs: Results for orders placed during the hospital encounter of 10/30/13 (from the past 24 hour(s))  MRSA PCR SCREENING     Status: None   Collection Time    10/30/13  1:05 PM      Result Value Ref Range   MRSA by PCR NEGATIVE  NEGATIVE  RENAL FUNCTION PANEL     Status: Abnormal   Collection Time    10/31/13  7:14 AM      Result Value Ref Range   Sodium 137  137 - 147 mEq/L   Potassium 6.3 (*) 3.7 - 5.3 mEq/L   Chloride 97  96 - 112 mEq/L   CO2 21  19 - 32 mEq/L   Glucose, Bld 107 (*) 70 - 99 mg/dL   BUN 32 (*) 6 - 23 mg/dL   Creatinine, Ser 7.82 (*) 0.50 - 1.35 mg/dL   Calcium 7.6 (*) 8.4 - 10.5 mg/dL   Phosphorus 3.2  2.3 - 4.6 mg/dL   Albumin 3.4 (*) 3.5 - 5.2 g/dL   GFR  calc non Af Amer 7 (*) >90 mL/min   GFR calc Af Amer 8 (*) >90 mL/min   Anion gap 19 (*) 5 - 15  CBC     Status: Abnormal   Collection Time    10/31/13  7:14 AM      Result Value Ref Range   WBC 4.1  4.0 - 10.5 K/uL   RBC 3.16 (*) 4.22 - 5.81 MIL/uL   Hemoglobin 11.2 (*) 13.0 - 17.0 g/dL   HCT 21.335.2 (*) 08.639.0 - 57.852.0 %   MCV 111.4 (*) 78.0 - 100.0 fL   MCH 35.4 (*) 26.0 - 34.0 pg   MCHC 31.8  30.0 - 36.0 g/dL   RDW 46.915.0  62.911.5 - 52.815.5 %   Platelets 139 (*) 150 - 400 K/uL    Disposition:      Follow-up Information   Follow up with Runell GessBERRY,JONATHAN J, MD. (office will call you)    Specialty:  Cardiology   Contact information:   [address] [place] [id number] [phone]       Discharge Medications:    Medication List          acetaminophen 325 MG tablet  Commonly known as:  TYLENOL  Take 2 tablets (650 mg total) by mouth every 4 (four) hours as needed for headache or mild pain.     allopurinol 100 MG tablet  Commonly known as:  ZYLOPRIM  Take 100 mg by mouth daily.     amiodarone 200 MG tablet  Commonly known as:  PACERONE  Take 100 mg by mouth at bedtime.     calcium-vitamin D 500-200 MG-UNIT per tablet  Commonly known as:  OSCAL WITH D  Take 1 tablet by mouth daily with breakfast.     cinacalcet 30 MG tablet  Commonly known as:  SENSIPAR  Take 30 mg by mouth every other day.     isosorbide mononitrate 30 MG 24 hr tablet  Commonly known as:  IMDUR  Take 30 mg by mouth at bedtime.     metoprolol tartrate 25 MG tablet  Commonly known as:  LOPRESSOR  Take 12.5 mg by mouth 2 (two) times daily.     multivitamin Tabs tablet  Take 1 tablet by mouth every morning.     nitroGLYCERIN 0.4 MG SL tablet  Commonly known as:  NITROSTAT  Place 0.4 mg under the tongue every 5 (five) minutes as needed for chest pain.     pantoprazole 40 MG tablet  Commonly known as:  PROTONIX  Take 40 mg by mouth daily.     PARoxetine 20 MG tablet  Commonly known as:  PAXIL  Take 20 mg by mouth daily.     prasugrel 10 MG Tabs tablet  Commonly known as:  EFFIENT  Take 10 mg by mouth daily.     rosuvastatin 10 MG tablet  Commonly known as:  CRESTOR  Take 10 mg by mouth daily.     sevelamer carbonate 800 MG tablet  Commonly known as:  RENVELA  Take 2,400 mg by mouth 3 (three) times daily with meals.         Duration of Discharge Encounter: Greater than 30 minutes including physician time.  Jolene ProvostSigned, Zaul Hubers PA-C 10/31/2013 10:26 AM

## 2013-10-31 NOTE — Progress Notes (Signed)
    Subjective:  No complaints this am  Objective:  Vital Signs in the last 24 hours: Temp:  [97.5 F (36.4 C)-98.3 F (36.8 C)] 98.3 F (36.8 C) (07/21 0538) Pulse Rate:  [44-97] 86 (07/21 0538) Resp:  [16-18] 16 (07/21 0538) BP: (93-158)/(32-89) 134/78 mmHg (07/21 0538) SpO2:  [92 %-100 %] 92 % (07/21 0538) Weight:  [169 lb 5 oz (76.8 kg)-170 lb (77.111 kg)] 169 lb 5 oz (76.8 kg) (07/21 0059)  Intake/Output from previous day:  Intake/Output Summary (Last 24 hours) at 10/31/13 0704 Last data filed at 10/31/13 0538  Gross per 24 hour  Intake    755 ml  Output      0 ml  Net    755 ml    Physical Exam: General appearance: alert, cooperative and no distress Lungs: basilar crackles bilat Heart: regular rate and rhythm and 2/6 systolic murmur Extremities: Rt groin without hemtoma   Rate: 84  Rhythm: indeterminate and RBBB  Lab Results: No results found for this basename: WBC, HGB, PLT,  in the last 72 hours No results found for this basename: NA, K, CL, CO2, GLUCOSE, BUN, CREATININE,  in the last 72 hours No results found for this basename: TROPONINI, CK, MB,  in the last 72 hours No results found for this basename: INR,  in the last 72 hours  Imaging: No results found.  Cardiac Studies:  Assessment/Plan:   Principal Problem:   Dyspnea on exertion Active Problems:   S/P- DES to LCx-OM -5/13, BMS to LCx-OM; 10/01/11 with DES to mid LAD and  PTCA of Diag 2   End stage renal disease on dialysis; HD T, Th Sat   Aortic stenosis, moderate by cath 10/30/13    HTN (hypertension)   PAD (peripheral artery disease): 60% bilateral common iliac stenosis    HLD (hyperlipidemia)   Bradycardia when in NSR   GERD (gastroesophageal reflux disease)   PAF-NSR on Amiodarone, no Coumadin secondary to GI bleeding   ICM- EF 45%   GI AVM (gastrointestinal arteriovenous vascular malformation)   RBBB    PLAN:  Will check a 12 lead EKG today- ? NSR with 1st AVB. He can probably be  discharged after dialysis. F/U with Dr Allyson SabalBerry.  Corine ShelterLuke Kilroy PA-C Bee[facility] [phone] 10/31/2013, 7:04 AM    Patient seen and examined. Agree with assessment and plan. Currently undergoing dialysis. No chest pain. Stable hemodynamics. Mild CAD with mild-mod AS with severe pulmonary HTN.  For dc following dialysis today with f/u Dr Allyson SabalBerry.   Lennette Biharihomas A. Kelly, MD, [facility] 10/31/2013 9:47 AM

## 2013-11-02 ENCOUNTER — Telehealth: Payer: Self-pay | Admitting: Cardiovascular Disease

## 2013-11-03 NOTE — Telephone Encounter (Signed)
Closed encounter °

## 2013-11-20 ENCOUNTER — Ambulatory Visit (INDEPENDENT_AMBULATORY_CARE_PROVIDER_SITE_OTHER): Payer: Medicare Other | Admitting: Physician Assistant

## 2013-11-20 ENCOUNTER — Encounter: Payer: Self-pay | Admitting: Physician Assistant

## 2013-11-20 VITALS — BP 128/76 | HR 60 | Ht 69.0 in | Wt 181.6 lb

## 2013-11-20 DIAGNOSIS — I350 Nonrheumatic aortic (valve) stenosis: Secondary | ICD-10-CM

## 2013-11-20 DIAGNOSIS — I480 Paroxysmal atrial fibrillation: Secondary | ICD-10-CM

## 2013-11-20 DIAGNOSIS — I255 Ischemic cardiomyopathy: Secondary | ICD-10-CM

## 2013-11-20 DIAGNOSIS — I4891 Unspecified atrial fibrillation: Secondary | ICD-10-CM

## 2013-11-20 DIAGNOSIS — I10 Essential (primary) hypertension: Secondary | ICD-10-CM

## 2013-11-20 DIAGNOSIS — I2589 Other forms of chronic ischemic heart disease: Secondary | ICD-10-CM

## 2013-11-20 DIAGNOSIS — E785 Hyperlipidemia, unspecified: Secondary | ICD-10-CM

## 2013-11-20 DIAGNOSIS — I359 Nonrheumatic aortic valve disorder, unspecified: Secondary | ICD-10-CM

## 2013-11-20 NOTE — Assessment & Plan Note (Signed)
Well controlled 

## 2013-11-20 NOTE — Assessment & Plan Note (Signed)
On a statin 

## 2013-11-20 NOTE — Progress Notes (Signed)
Date:  11/20/2013   ID:  Roger [name], DOB 07-19-1940, MRN [id number]  PCP:  [name], MD  Primary Cardiologist:  [name]    History of Present Illness: Roger [name] is a 73 y.o. male  with a history of ischemic heart disease, status post multiple interventions in the past. He also has ESRD on hemodialysis, hypertension, and hyperlipidemia. He has had AFib in the past- NSR on Amiodarone, and has mild to moderate aortic stenosis. He was on aspirin, Effient, and Coumadin and had a GI bleed March 2014. He was evaluated by Dr. Leone Payor from above and below with no source found. Ultimately he was transfused 4 units of packed red blood cells. His hemoglobin improved and his bleeding resolved spontaneously. He was taken off his aspirin and his Coumadin and has been maintaining sinus rhythm.  A recent 2-D echo suggested progression of his aortic stenosis - now with a valve area of 0.8 cm. Dr [name] was concerned he had progression of his aortic stenosis. 2-D echocardiogram 09/08/13 revealed an ejection fraction of 45%, segmental wall motion abnormalities and severe aortic stenosis with a valve area of 0.93 cm I.  The peak gradient of 58 mmHg. He underwent  Rt and Lt heart caths on 10/30/13. This revealed noncritical CAD, moderate LV dysfunction, mild to moderate aortic stenosis and severe pulmonary hypertension with elevated filling pressures.    The patient presents today for post-cath evaluation.  He reports some SOB with exertion.  But otherwise denies nausea, vomiting, fever, chest pain, orthopnea, dizziness, PND, cough, congestion, abdominal pain, hematochezia, melena, lower extremity edema, claudication.  Wt Readings from Last 3 Encounters:  11/20/13 181 lb 9.6 oz (82.373 kg)  10/31/13 181 lb 7 oz (82.3 kg)  10/31/13 181 lb 7 oz (82.3 kg)     Past Medical History  Diagnosis Date  . HTN (hypertension)   . Hypercholesteremia   . GERD (gastroesophageal reflux disease)   . Atrial fibrillation      Paroxysmal - 2 episodes related to ischemic NSTEMI events  5 & 09/2011  . Gout   . S/P coronary artery stent placement, 10/01/11 with DES to mid LAD and  PTCA of Diag 2 10/01/2009    Inferior STEMI 2011 - BMS to LCx; NSTEMI 5/13 with ISR/thrombosis of Cx BMS -->multiple DES to  LCx ; staged PCI of LAD /Balloon PTCA of major Diag 09/2011 for Sx (NSTEMI)   . Anemia   . Schatzki's ring   . Angiodysplasia of intestine   . Aortic stenosis, moderate     2D Echocardiogram 12/13 and 08/22/11 showed moderate aortic stenosis with valve area of 1.28 sq cm. EF of 45 to 50% with severe hypokinesia of the entire inferolateral myocardium  . Ulcer   . Heart murmur   . MI (myocardial infarction) 03/16/2011    Inferolateral ST segment elevation MI  . MI (myocardial infarction)     "he's had 4" (10/30/2013)  . History of blood transfusion ~ 2013    "several; had bleeding in small intestines"  . Lower GI bleeding ~ 2013  . Arthritis     "legs" (10/30/2013)  . ESRD on hemodialysis     [place] HD, TTS, esrd due to HTN (10/30/2013)  . Right renal artery stenosis     renal stent in 1998  . Pneumonia 07/2013    Current Outpatient Prescriptions  Medication Sig Dispense Refill  . acetaminophen (TYLENOL) 325 MG tablet Take 2 tablets (650 mg total) by mouth  every 4 (four) hours as needed for headache or mild pain.      [name]. allopurinol (ZYLOPRIM) 100 MG tablet Take 100 mg by mouth daily.       [name]. amiodarone (PACERONE) 200 MG tablet Take 100 mg by mouth at bedtime.      . calcium-vitamin D (OSCAL WITH D) 500-200 MG-UNIT per tablet Take 1 tablet by mouth daily with breakfast.      . cinacalcet (SENSIPAR) 30 MG tablet Take 30 mg by mouth every other day.       . isosorbide mononitrate (IMDUR) 30 MG 24 hr tablet Take 30 mg by mouth at bedtime.      . metoprolol tartrate (LOPRESSOR) 25 MG tablet Take 12.5 mg by mouth 2 (two) times daily.      . multivitamin (RENA-VIT) TABS tablet Take 1 tablet by mouth every morning.        . nitroGLYCERIN (NITROSTAT) 0.4 MG SL tablet Place 0.4 mg under the tongue every 5 (five) minutes as needed for chest pain.      . pantoprazole (PROTONIX) 40 MG tablet Take 40 mg by mouth daily.      [name]. PARoxetine (PAXIL) 20 MG tablet Take 20 mg by mouth daily.      . prasugrel (EFFIENT) 10 MG TABS tablet Take 10 mg by mouth daily.      . rosuvastatin (CRESTOR) 10 MG tablet Take 10 mg by mouth daily.      . sevelamer (RENVELA) 800 MG tablet Take 2,400 mg by mouth 3 (three) times daily with meals.       No current facility-administered medications for this visit.   Facility-Administered Medications Ordered in Other Visits  Medication Dose Route Frequency Provider Last Rate Last Dose  . 0.9 %  sodium chloride infusion  250 mL Intravenous Once Lauris PoagAlvin C Powell, MD      . 0.9 %  sodium chloride infusion  250 mL Intravenous Once Lauris PoagAlvin C Powell, MD      . heparin lock flush 100 unit/mL  500 Units Intracatheter Daily PRN Lauris PoagAlvin C Powell, MD      . heparin lock flush 100 unit/mL  250 Units Intracatheter PRN Lauris PoagAlvin C Powell, MD      . heparin lock flush 100 unit/mL  500 Units Intracatheter Daily PRN Lauris PoagAlvin C Powell, MD      . heparin lock flush 100 unit/mL  250 Units Intracatheter PRN Lauris PoagAlvin C Powell, MD      . sodium chloride 0.9 % injection 10 mL  10 mL Intracatheter PRN Lauris PoagAlvin C Powell, MD      . sodium chloride 0.9 % injection 10 mL  10 mL Intracatheter PRN Lauris PoagAlvin C Powell, MD      . sodium chloride 0.9 % injection 3 mL  3 mL Intracatheter PRN Lauris PoagAlvin C Powell, MD      . sodium chloride 0.9 % injection 3 mL  3 mL Intracatheter PRN Lauris PoagAlvin C Powell, MD        Allergies:    Allergies  Allergen Reactions  . Diltiazem     Not able to tolerate due to SOB    Social History:  The patient  reports that he quit smoking about 21 years ago. His smoking use included Cigarettes. He has a 22.5 pack-year smoking history. His smokeless tobacco use includes Chew. He reports that he does not drink alcohol or use  illicit drugs.   Family history:   Family History  Problem Relation Age of Onset  .  Coronary artery disease Father   . Hypertension Mother   . Coronary artery disease Mother   . Colon cancer Brother 57    ROS:  Please see the history of present illness.  All other systems reviewed and negative.   PHYSICAL EXAM: VS:  BP 128/76  Pulse 60  Ht 5\' 9"  (1.753 m)  Wt 181 lb 9.6 oz (82.373 kg)  BMI 26.81 kg/m2 Well nourished, well developed, in no acute distress HEENT: Pupils are equal round react to light accommodation extraocular movements are intact.  Neck: no JVDNo cervical lymphadenopathy. Cardiac: Regular rate and rhythm with 3/6 systolic murmur Lungs:  clear to auscultation bilaterally, no wheezing, rhonchi or rales Ext: Trace  lower extremity edema.  2+ radial and posterior tibialis pulses. Skin: warm and dry Neuro:  Grossly normal      ASSESSMENT AND PLAN:  Problem List Items Addressed This Visit   HTN (hypertension) (Chronic)     Well controlled    HLD (hyperlipidemia) (Chronic)     On a statin    PAF-NSR on Amiodarone, no Coumadin secondary to GI bleeding (Chronic)     Rate well controlled on exam.  On amio and lopressor.    Aortic stenosis, moderate by cath 10/30/13  - Primary (Chronic)     Continue to follow with annual echo.      ICM- EF 45% (Chronic)     Monitor weight daily.  Low sodium diet.

## 2013-11-20 NOTE — Assessment & Plan Note (Signed)
Rate well controlled on exam.  On amio and lopressor.

## 2013-11-20 NOTE — Assessment & Plan Note (Signed)
Continue to follow with annual echo.

## 2013-11-20 NOTE — Assessment & Plan Note (Addendum)
Monitor weight daily.  Low sodium diet.  Appears euvolemic.

## 2013-11-20 NOTE — Patient Instructions (Signed)
1. Monitor weight daily.   2.  Follow up with Dr. Allyson SabalBerry in a month.

## 2013-12-06 LAB — HEPATIC FUNCTION PANEL
ALK PHOS: 190 U/L — AB (ref 39–117)
ALT: 14 U/L (ref 0–53)
AST: 15 U/L (ref 0–37)
Albumin: 4.2 g/dL (ref 3.5–5.2)
Bilirubin, Direct: 0.3 mg/dL (ref 0.0–0.3)
Indirect Bilirubin: 0.6 mg/dL (ref 0.2–1.2)
Total Bilirubin: 0.9 mg/dL (ref 0.2–1.2)
Total Protein: 7.7 g/dL (ref 6.0–8.3)

## 2014-01-03 ENCOUNTER — Encounter: Payer: Self-pay | Admitting: *Deleted

## 2014-01-15 ENCOUNTER — Other Ambulatory Visit: Payer: Self-pay

## 2014-01-15 MED ORDER — AMIODARONE HCL 200 MG PO TABS: 100.0000 mg | ORAL_TABLET | Freq: Every day | ORAL | Status: DC

## 2014-01-15 MED ORDER — AMIODARONE HCL 200 MG PO TABS: 100.0000 mg | ORAL_TABLET | Freq: Every day | ORAL | Status: AC

## 2014-01-15 NOTE — Telephone Encounter (Signed)
Rx sent to pharmacy   

## 2014-02-15 ENCOUNTER — Encounter: Payer: Self-pay | Admitting: Cardiovascular Disease

## 2014-03-07 ENCOUNTER — Ambulatory Visit: Payer: Medicare Other | Admitting: Cardiovascular Disease

## 2014-03-22 ENCOUNTER — Encounter (HOSPITAL_COMMUNITY): Payer: Self-pay | Admitting: Cardiology

## 2014-04-03 ENCOUNTER — Ambulatory Visit (INDEPENDENT_AMBULATORY_CARE_PROVIDER_SITE_OTHER): Payer: Medicare Other | Admitting: Cardiology

## 2014-04-03 ENCOUNTER — Encounter: Payer: Self-pay | Admitting: Cardiology

## 2014-04-03 VITALS — BP 140/70 | HR 57 | Ht 69.0 in | Wt 173.3 lb

## 2014-04-03 DIAGNOSIS — Z992 Dependence on renal dialysis: Secondary | ICD-10-CM

## 2014-04-03 DIAGNOSIS — I4891 Unspecified atrial fibrillation: Secondary | ICD-10-CM

## 2014-04-03 DIAGNOSIS — I10 Essential (primary) hypertension: Secondary | ICD-10-CM

## 2014-04-03 DIAGNOSIS — N186 End stage renal disease: Secondary | ICD-10-CM

## 2014-04-03 DIAGNOSIS — I480 Paroxysmal atrial fibrillation: Secondary | ICD-10-CM

## 2014-04-03 DIAGNOSIS — R42 Dizziness and giddiness: Secondary | ICD-10-CM | POA: Insufficient documentation

## 2014-04-03 DIAGNOSIS — I350 Nonrheumatic aortic (valve) stenosis: Secondary | ICD-10-CM

## 2014-04-03 DIAGNOSIS — Z955 Presence of coronary angioplasty implant and graft: Secondary | ICD-10-CM

## 2014-04-03 MED ORDER — PRASUGREL HCL 10 MG PO TABS: 10.0000 mg | ORAL_TABLET | Freq: Every day | ORAL | Status: DC

## 2014-04-03 NOTE — Assessment & Plan Note (Signed)
His main complaint today is dizziness. He says his balance if off sometimes, not clearly orthostatic.

## 2014-04-03 NOTE — Assessment & Plan Note (Signed)
B/P controlled 

## 2014-04-03 NOTE — Assessment & Plan Note (Signed)
Patent stents July 2015

## 2014-04-03 NOTE — Progress Notes (Signed)
04/03/2014 Roger [name]   February 21, 1941  [id number]  Primary Physician [name], MD Primary Cardiologist: [name]  HPI:  73 year old male with a history of ischemic heart disease, status post multiple interventions in the past. He also has ESRD on hemodialysis, hypertension, and hyperlipidemia. He has had AFib in the past- he is in NSR on Amiodarone- and has mild to moderate aortic stenosis. He was on aspirin, Effient, and Coumadin and had a GI bleed March 2014. He was evaluated by Dr. Leone Payor from above and below with no source found. Ultimately he was transfused 4 units of packed red blood cells. His hemoglobin improved and his bleeding resolved spontaneously. He was taken off his aspirin and his Coumadin and has been maintaining sinus rhythm. He is on Effient alone now.  2-D echo in May 2015 suggested progression of his aortic stenosis - with a valve area of 0.8 cm. Dr [name] Was concerned he had progression of his aortic stenosis. He was admitted for Rt and Lt heart cath 10/30/13. This revealed noncritical CAD, moderate LV dysfunction, mild to moderate aortic stenosis and severe pulmonary hypertension with elevated filling pressures. Dr [name] suggested continued medical Rx.            He is in the office today for follow up. One of his physicians suggested he come and be checked because of his dizziness and reported low B/P, though his B/P today is 140 systolic. His dizziness sounds more like a gait problem. He denies palpitations or true syncope. He denies chest pain. He was taken off statin therapy for an LDL of 12 this summer.     Current Outpatient Prescriptions  Medication Sig Dispense Refill  . acetaminophen (TYLENOL) 325 MG tablet Take 2 tablets (650 mg total) by mouth every 4 (four) hours as needed for headache or mild pain.    [name] allopurinol (ZYLOPRIM) 100 MG tablet Take 100 mg by mouth daily.     [name] amiodarone (PACERONE) 200 MG tablet Take 0.5 tablets (100 mg total) by mouth at  bedtime. 100 tablet 0  . calcium-vitamin D (OSCAL WITH D) 500-200 MG-UNIT per tablet Take 1 tablet by mouth daily with breakfast.    . clobetasol cream (TEMOVATE) 0.05 % Apply 1 application topically as needed.    . metoprolol tartrate (LOPRESSOR) 25 MG tablet Take 12.5 mg by mouth 2 (two) times daily.    . multivitamin (RENA-VIT) TABS tablet Take 1 tablet by mouth every morning.     . nitroGLYCERIN (NITROSTAT) 0.4 MG SL tablet Place 0.4 mg under the tongue every 5 (five) minutes as needed for chest pain.    . pantoprazole (PROTONIX) 40 MG tablet Take 40 mg by mouth daily.    [name] PARoxetine (PAXIL) 20 MG tablet Take 20 mg by mouth daily.    . prasugrel (EFFIENT) 10 MG TABS tablet Take 1 tablet (10 mg total) by mouth daily. 30 tablet 3  . sevelamer (RENVELA) 800 MG tablet Take 2,400 mg by mouth 3 (three) times daily with meals.     No current facility-administered medications for this visit.   Facility-Administered Medications Ordered in Other Visits  Medication Dose Route Frequency Provider Last Rate Last Dose  . 0.9 %  sodium chloride infusion  250 mL Intravenous Once [name], MD      . 0.9 %  sodium chloride infusion  250 mL Intravenous Once [name], MD      . heparin lock flush 100 unit/mL  500  Units Intracatheter Daily PRN Lauris PoagAlvin C Powell, MD      . heparin lock flush 100 unit/mL  250 Units Intracatheter PRN Lauris PoagAlvin C Powell, MD      . heparin lock flush 100 unit/mL  500 Units Intracatheter Daily PRN Lauris PoagAlvin C Powell, MD      . heparin lock flush 100 unit/mL  250 Units Intracatheter PRN Lauris PoagAlvin C Powell, MD      . sodium chloride 0.9 % injection 10 mL  10 mL Intracatheter PRN Lauris PoagAlvin C Powell, MD      . sodium chloride 0.9 % injection 10 mL  10 mL Intracatheter PRN Lauris PoagAlvin C Powell, MD      . sodium chloride 0.9 % injection 3 mL  3 mL Intracatheter PRN Lauris PoagAlvin C Powell, MD      . sodium chloride 0.9 % injection 3 mL  3 mL Intracatheter PRN Lauris PoagAlvin C Powell, MD        Allergies  Allergen  Reactions  . Diltiazem     Not able to tolerate due to SOB    History   Social History  . Marital Status: Married    Spouse Name: N/A    Number of Children: 3  . Years of Education: 10   Occupational History  . retired    Social History Main Topics  . Smoking status: Former Smoker -- 0.50 packs/day for 45 years    Types: Cigarettes    Quit date: 04/13/1992  . Smokeless tobacco: Current User    Types: Chew  . Alcohol Use: No  . Drug Use: No  . Sexual Activity: No   Other Topics Concern  . Not on file   Social History Narrative   Occupation: raised Tobacco, worked for [employer] as well.      Review of Systems: General: negative for chills, fever, night sweats or weight changes.  Cardiovascular: negative for chest pain, dyspnea on exertion, edema, orthopnea, palpitations, paroxysmal nocturnal dyspnea or shortness of breath Dermatological: negative for rash Respiratory: negative for cough or wheezing Urologic: negative for hematuria Abdominal: negative for nausea, vomiting, diarrhea, bright red blood per rectum, melena, or hematemesis Neurologic: negative for visual changes, syncope, or dizziness All other systems reviewed and are otherwise negative except as noted above.    Blood pressure 140/70, pulse 57, height 5\' 9"  (1.753 m), weight 173 lb 4.8 oz (78.608 kg).  General appearance: alert, cooperative and no distress Neck: no JVD and transmitted murmur Lungs: clear to auscultation bilaterally Heart: regular rate and rhythm and 3/6 systolic murmur  EKG NSR SB  ASSESSMENT AND PLAN:   Dizziness His main complaint today is dizziness. He says his balance if off sometimes, not clearly orthostatic.  Aortic stenosis, moderate by cath 10/30/13  No chest pain, some DOE, no real change  S/P- DES to LCx-OM -5/13, BMS to LCx-OM; 10/01/11 with DES to mid LAD and  PTCA of Diag 2 Patent stents July 2015  PAF-NSR on Amiodarone, no Coumadin secondary to GI bleeding Holding NSR  by history and EKG  HTN (hypertension) B/P controlled  End stage renal disease on dialysis; HD T, Th [place], Dr Lowell GuitarPowell follows him   PLAN  I suggested Roger [name] stop his Imdur- he had patent coronaries and is not having angina. He was taking his Imdur at night as well. If his symptoms don't improve he may need Neurologic evaluation. Carotid dopplers this past spring showed "mild" disease. The other concern is the possibility that his  gait problem is from Amiodarone. If stopping the Imdur has no affect and he has a negative neurologic work up this may need to be considered.  I would be hesitant to stop this. I'll ask him to follow up with Dr Allyson SabalBerry in three months.   [facility] IncKILROY,Rochella Benner KPA-C 04/03/2014 2:37 PM

## 2014-04-03 NOTE — Assessment & Plan Note (Signed)
No chest pain, some DOE, no real change

## 2014-04-03 NOTE — Patient Instructions (Signed)
Your physician recommends that you schedule a follow-up appointment in: 3 MONTHS WITH DR BERRY  STOP ISOSORBIDE

## 2014-04-03 NOTE — Assessment & Plan Note (Signed)
Dialyses in [place], Dr Lowell GuitarPowell follows him

## 2014-04-03 NOTE — Assessment & Plan Note (Signed)
Holding NSR by history and EKG

## 2014-04-18 ENCOUNTER — Ambulatory Visit (INDEPENDENT_AMBULATORY_CARE_PROVIDER_SITE_OTHER): Payer: Medicare Other | Admitting: Cardiovascular Disease

## 2014-04-18 ENCOUNTER — Encounter: Payer: Self-pay | Admitting: Cardiovascular Disease

## 2014-04-18 VITALS — BP 122/70 | HR 66 | Ht 69.0 in | Wt 173.2 lb

## 2014-04-18 DIAGNOSIS — I4891 Unspecified atrial fibrillation: Secondary | ICD-10-CM

## 2014-04-18 DIAGNOSIS — E785 Hyperlipidemia, unspecified: Secondary | ICD-10-CM

## 2014-04-18 DIAGNOSIS — I10 Essential (primary) hypertension: Secondary | ICD-10-CM

## 2014-04-18 DIAGNOSIS — I739 Peripheral vascular disease, unspecified: Secondary | ICD-10-CM

## 2014-04-18 DIAGNOSIS — R42 Dizziness and giddiness: Secondary | ICD-10-CM

## 2014-04-18 DIAGNOSIS — I480 Paroxysmal atrial fibrillation: Secondary | ICD-10-CM

## 2014-04-18 DIAGNOSIS — Z955 Presence of coronary angioplasty implant and graft: Secondary | ICD-10-CM

## 2014-04-18 DIAGNOSIS — I350 Nonrheumatic aortic (valve) stenosis: Secondary | ICD-10-CM

## 2014-04-18 MED ORDER — PRASUGREL HCL 10 MG PO TABS: 10.0000 mg | ORAL_TABLET | Freq: Every day | ORAL | Status: DC

## 2014-04-18 MED ORDER — PANTOPRAZOLE SODIUM 40 MG PO TBEC: 40.0000 mg | DELAYED_RELEASE_TABLET | Freq: Every day | ORAL | Status: DC

## 2014-04-18 NOTE — Patient Instructions (Addendum)
Echocardiogram. Echocardiography is a painless test that uses sound waves to create images of your heart. It provides your doctor with information about the size and shape of your heart and how well your heart's chambers and valves are working. This procedure takes approximately one hour. There are no restrictions for this procedure.  Your physician wants you to follow-up in 6 months with Dr. Allyson SabalBerry. You will receive a reminder letter in the mail 2 months in advance. If you do not receive a letter, please call our office to schedule the follow-up appointment.  PLEASE STOP taking IMDUR

## 2014-04-18 NOTE — Progress Notes (Signed)
04/18/2014 Roger [name]   1941/02/01  [id number]  Primary Physician Roger Reichert, MD Primary Cardiologist: Roger Gess MD Roger [name]   HPI:   He is a 74 year old married Caucasian male father of 3, grandfather of 4 grandchildren, accompanied by his daughter today. I saw him approximately 6 months  ago. He has a history of ischemic heart disease, status post multiple interventions in the past. He also has chronic renal insufficiency, on hemodialysis, hypertension, and hyperlipidemia. He has had AFib in the past and has mild to moderate aortic stenosis. He was on aspirin, Effient, and Coumadin and had a GI bleed and had endoscopy by Dr. Leone [name] from above and below with no source found. Ultimately he was transfused 4 units of packed red blood cells. His hemoglobin improved and his bleeding resolved spontaneously. He was taken off his aspirin and his Coumadin, maintaining sinus rhythm.I last saw him in the office in December 2013. He was admitted in March with recurrent GI bleed. His antiplatelet agent was held for one month and restarted. He denies chest pain or shortness of breath. He does have a recent 2-D echo was suggested progression of his aortic stenosis now with a valve area of 0.8 cm.since I saw him 6 months ago he has been relatively asymptomatic specifically denying chest pain or shortness of breath. Recent lower extremity artery Doppler studies showed normal ABIs with progression of his disease in the iliac arteries. Since I saw him in the office 05/19/13 he has been admitted with pneumonia and was in atrial fibrillation. He was placed on diltiazem by Dr. Diona [name]. Unfortunately, he was not able to tolerate this because of bradycardia and hypotension especially during hemodialysis. He does complain of increasing dyspnea on exertion. I suspect this is multifactorial with progression of his aortic stenosis.recent 2-D echocardiogram performed several weeks ago revealed an  ejection fraction of 45%, segmental wall motion abnormalities and severe aortic stenosis with a valve area of 0.93 cm I. The peak gradient of 58 mmHg. He saw Roger [name] [facility] approximately 2 weeks ago with complaints of dizziness. This sounds more like a vestibular issue although Roger [name] recommended stopping his Imdur since he is not having any chest pain.   Current Outpatient Prescriptions  Medication Sig Dispense Refill  . acetaminophen (TYLENOL) 325 MG tablet Take 2 tablets (650 mg total) by mouth every 4 (four) hours as needed for headache or mild pain.    [name] allopurinol (ZYLOPRIM) 100 MG tablet Take 100 mg by mouth daily.     [name] amiodarone (PACERONE) 200 MG tablet Take 0.5 tablets (100 mg total) by mouth at bedtime. 100 tablet 0  . clobetasol cream (TEMOVATE) 0.05 % Apply 1 application topically as needed.    . metoprolol tartrate (LOPRESSOR) 25 MG tablet Take 12.5 mg by mouth 2 (two) times daily.    . multivitamin (RENA-VIT) TABS tablet Take 1 tablet by mouth every morning.     . nitroGLYCERIN (NITROSTAT) 0.4 MG SL tablet Place 0.4 mg under the tongue every 5 (five) minutes as needed for chest pain.    . pantoprazole (PROTONIX) 40 MG tablet Take 1 tablet (40 mg total) by mouth daily. 90 tablet 3  . PARoxetine (PAXIL) 20 MG tablet Take 20 mg by mouth daily.    . prasugrel (EFFIENT) 10 MG TABS tablet Take 1 tablet (10 mg total) by mouth daily. 35 tablet 0  . sevelamer (RENVELA) 800 MG tablet Take 2,400 mg by mouth 3 (three) times daily  with meals.     No current facility-administered medications for this visit.   Facility-Administered Medications Ordered in Other Visits  Medication Dose Route Frequency Provider Last Rate Last Dose  . 0.9 %  sodium chloride infusion  250 mL Intravenous Once Roger Poag, MD      . 0.9 %  sodium chloride infusion  250 mL Intravenous Once Roger Poag, MD      . heparin lock flush 100 unit/mL  500 Units Intracatheter Daily PRN Roger Poag, MD      . heparin  lock flush 100 unit/mL  250 Units Intracatheter PRN Roger Poag, MD      . heparin lock flush 100 unit/mL  500 Units Intracatheter Daily PRN Roger Poag, MD      . heparin lock flush 100 unit/mL  250 Units Intracatheter PRN Roger Poag, MD      . sodium chloride 0.9 % injection 10 mL  10 mL Intracatheter PRN Roger Poag, MD      . sodium chloride 0.9 % injection 10 mL  10 mL Intracatheter PRN Roger Poag, MD      . sodium chloride 0.9 % injection 3 mL  3 mL Intracatheter PRN Roger Poag, MD      . sodium chloride 0.9 % injection 3 mL  3 mL Intracatheter PRN Roger Poag, MD        Allergies  Allergen Reactions  . Diltiazem     Not able to tolerate due to SOB    History   Social History  . Marital Status: Married    Spouse Name: N/A    Number of Children: 3  . Years of Education: 10   Occupational History  . retired    Social History Main Topics  . Smoking status: Former Smoker -- 0.50 packs/day for 45 years    Types: Cigarettes    Quit date: 04/13/1992  . Smokeless tobacco: Current User    Types: Chew  . Alcohol Use: No  . Drug Use: No  . Sexual Activity: No   Other Topics Concern  . 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      Review of Systems: General: negative for chills, fever, night sweats or weight changes.  Cardiovascular: negative for chest pain, dyspnea on exertion, edema, orthopnea, palpitations, paroxysmal nocturnal dyspnea or shortness of breath Dermatological: negative for rash Respiratory: negative for cough or wheezing Urologic: negative for hematuria Abdominal: negative for nausea, vomiting, diarrhea, bright red blood per rectum, melena, or hematemesis Neurologic: negative for visual changes, syncope, or dizziness All other systems reviewed and are otherwise negative except as noted above.    Blood pressure 122/70, pulse 66, height  (1.753 m), weight 173 lb 3.2 oz  (78.563 kg).  General appearance: alert and no distress Neck: no adenopathy, no JVD, supple, symmetrical, trachea midline and soft right carotid bruit Lungs: clear to auscultation bilaterally Heart: 2/6 outflow tract murmur consistent with aortic stenosis Extremities: extremities normal, atraumatic, no cyanosis or edema  EKG not performed today  ASSESSMENT AND PLAN:   S/P- DES to LCx-OM -5/13, BMS to LCx-OM; 10/01/11 with DES to mid LAD and  PTCA of Diag 2 History of CAD status post stenting of multiple coronary arteries in the past. His last crit catheterization performed 10/30/13 done to assess his aortic valve revealed widely patent stents with moderate LV dysfunction, mild  to moderate aortic stenosis and severe pulmonary hypertension. He denies chest pain. He does have some dyspnea on exertion which has not changed in severity. He is on Effient.  PAF-NSR on Amiodarone, no Coumadin secondary to GI bleeding History of PAF maintaining sinus rhythm. He is not on an oral anticoagulant because of prior history of GI bleeding. He is maintained on amiodarone.  PAD (peripheral artery disease): 60% bilateral common iliac stenosis  History of bilateral iliac disease with Dopplers performed a year ago that showed mild to moderate disease bilaterally. The patient denies claudication  HTN (hypertension) History of hypertension with blood pressure measured today 122/70. He is on metoprolol. Continue current medications  HLD (hyperlipidemia) History of hyperlipidemia though currently not on a statin drug. His last lipid profile performed 10/25/13 revealed a total social 70, LDL 12 HDL of 38  Aortic stenosis, moderate by cath 10/30/13  History of aortic stenosis with his last echo performed 09/08/13 revealed an EF of 45% with an aortic valve area of 0.93 cm squared. His peak gradient was 58 mmHg with a mean of 35 mmHg. I'm going to recheck a 2-D echocardiogram for severity of aortic  stenosis      Roger GessJonathan J. Dreux Mcgroarty MD [facility]FACP,FACC,FAHA, [name]SCAI 04/18/2014 12:37 PM

## 2014-04-18 NOTE — Assessment & Plan Note (Signed)
History of hyperlipidemia though currently not on a statin drug. His last lipid profile performed 10/25/13 revealed a total social 70, LDL 12 HDL of 38

## 2014-04-18 NOTE — Assessment & Plan Note (Signed)
History of hypertension with blood pressure measured today 122/70. He is on metoprolol. Continue current medications

## 2014-04-18 NOTE — Assessment & Plan Note (Addendum)
History of aortic stenosis with his last echo performed 09/08/13 revealed an EF of 45% with an aortic valve area of 0.93 cm squared. His peak gradient was 58 mmHg with a mean of 35 mmHg. I'm going to recheck a 2-D echocardiogram for severity of aortic stenosis

## 2014-04-18 NOTE — Assessment & Plan Note (Signed)
History of bilateral iliac disease with Dopplers performed a year ago that showed mild to moderate disease bilaterally. The patient denies claudication

## 2014-04-18 NOTE — Assessment & Plan Note (Signed)
History of PAF maintaining sinus rhythm. He is not on an oral anticoagulant because of prior history of GI bleeding. He is maintained on amiodarone.

## 2014-04-18 NOTE — Assessment & Plan Note (Signed)
History of CAD status post stenting of multiple coronary arteries in the past. His last crit catheterization performed 10/30/13 done to assess his aortic valve revealed widely patent stents with moderate LV dysfunction, mild to moderate aortic stenosis and severe pulmonary hypertension. He denies chest pain. He does have some dyspnea on exertion which has not changed in severity. He is on Effient.

## 2014-04-25 ENCOUNTER — Ambulatory Visit (HOSPITAL_COMMUNITY)
Admission: RE | Admit: 2014-04-25 | Discharge: 2014-04-25 | Disposition: A | Discharge status: Home / Self Care | Payer: Medicare Other | Source: Ambulatory Visit | Attending: Internal Medicine | Admitting: Internal Medicine

## 2014-04-25 DIAGNOSIS — I10 Essential (primary) hypertension: Secondary | ICD-10-CM | POA: Diagnosis not present

## 2014-04-25 DIAGNOSIS — I4891 Unspecified atrial fibrillation: Secondary | ICD-10-CM

## 2014-04-25 DIAGNOSIS — I359 Nonrheumatic aortic valve disorder, unspecified: Secondary | ICD-10-CM | POA: Diagnosis not present

## 2014-04-25 DIAGNOSIS — I350 Nonrheumatic aortic (valve) stenosis: Secondary | ICD-10-CM

## 2014-04-25 NOTE — Progress Notes (Signed)
2D Echo Performed 04/25/2014    Laporsche Hoeger, RCS  

## 2014-04-26 ENCOUNTER — Encounter: Payer: Self-pay | Admitting: *Deleted

## 2014-05-09 ENCOUNTER — Encounter: Payer: Self-pay | Admitting: Cardiovascular Disease

## 2014-05-09 MED ORDER — PANTOPRAZOLE SODIUM 40 MG PO TBEC: 40.0000 mg | DELAYED_RELEASE_TABLET | Freq: Every day | ORAL | Status: DC

## 2014-05-11 ENCOUNTER — Telehealth (HOSPITAL_COMMUNITY): Payer: Self-pay | Admitting: *Deleted

## 2014-05-27 ENCOUNTER — Encounter (HOSPITAL_COMMUNITY): Payer: Self-pay | Admitting: Emergency Medicine

## 2014-05-27 ENCOUNTER — Emergency Department (HOSPITAL_COMMUNITY)
Admission: EM | Admit: 2014-05-27 | Discharge: 2014-05-27 | Disposition: A | Discharge status: Home / Self Care | Payer: Medicare Other | Attending: Emergency Medicine | Admitting: Emergency Medicine

## 2014-05-27 ENCOUNTER — Emergency Department (HOSPITAL_COMMUNITY): Payer: Medicare Other

## 2014-05-27 ENCOUNTER — Other Ambulatory Visit: Payer: Self-pay

## 2014-05-27 DIAGNOSIS — K219 Gastro-esophageal reflux disease without esophagitis: Secondary | ICD-10-CM | POA: Diagnosis not present

## 2014-05-27 DIAGNOSIS — Z862 Personal history of diseases of the blood and blood-forming organs and certain disorders involving the immune mechanism: Secondary | ICD-10-CM | POA: Diagnosis not present

## 2014-05-27 DIAGNOSIS — Z9861 Coronary angioplasty status: Secondary | ICD-10-CM | POA: Diagnosis not present

## 2014-05-27 DIAGNOSIS — Z8701 Personal history of pneumonia (recurrent): Secondary | ICD-10-CM | POA: Diagnosis not present

## 2014-05-27 DIAGNOSIS — Z992 Dependence on renal dialysis: Secondary | ICD-10-CM | POA: Insufficient documentation

## 2014-05-27 DIAGNOSIS — R2243 Localized swelling, mass and lump, lower limb, bilateral: Secondary | ICD-10-CM | POA: Diagnosis not present

## 2014-05-27 DIAGNOSIS — K644 Residual hemorrhoidal skin tags: Secondary | ICD-10-CM | POA: Diagnosis not present

## 2014-05-27 DIAGNOSIS — R531 Weakness: Secondary | ICD-10-CM | POA: Diagnosis present

## 2014-05-27 DIAGNOSIS — Z7951 Long term (current) use of inhaled steroids: Secondary | ICD-10-CM | POA: Insufficient documentation

## 2014-05-27 DIAGNOSIS — I120 Hypertensive chronic kidney disease with stage 5 chronic kidney disease or end stage renal disease: Secondary | ICD-10-CM | POA: Diagnosis not present

## 2014-05-27 DIAGNOSIS — I252 Old myocardial infarction: Secondary | ICD-10-CM | POA: Insufficient documentation

## 2014-05-27 DIAGNOSIS — Z87891 Personal history of nicotine dependence: Secondary | ICD-10-CM | POA: Insufficient documentation

## 2014-05-27 DIAGNOSIS — M109 Gout, unspecified: Secondary | ICD-10-CM | POA: Insufficient documentation

## 2014-05-27 DIAGNOSIS — Z79899 Other long term (current) drug therapy: Secondary | ICD-10-CM | POA: Diagnosis not present

## 2014-05-27 DIAGNOSIS — N186 End stage renal disease: Secondary | ICD-10-CM | POA: Diagnosis not present

## 2014-05-27 DIAGNOSIS — M1990 Unspecified osteoarthritis, unspecified site: Secondary | ICD-10-CM | POA: Insufficient documentation

## 2014-05-27 DIAGNOSIS — K921 Melena: Secondary | ICD-10-CM | POA: Diagnosis not present

## 2014-05-27 DIAGNOSIS — Z9889 Other specified postprocedural states: Secondary | ICD-10-CM | POA: Diagnosis not present

## 2014-05-27 DIAGNOSIS — R011 Cardiac murmur, unspecified: Secondary | ICD-10-CM | POA: Diagnosis not present

## 2014-05-27 DIAGNOSIS — I482 Chronic atrial fibrillation, unspecified: Secondary | ICD-10-CM

## 2014-05-27 DIAGNOSIS — Z8639 Personal history of other endocrine, nutritional and metabolic disease: Secondary | ICD-10-CM | POA: Diagnosis not present

## 2014-05-27 DIAGNOSIS — N189 Chronic kidney disease, unspecified: Secondary | ICD-10-CM

## 2014-05-27 LAB — I-STAT TROPONIN, ED: TROPONIN I, POC: 0.06 ng/mL (ref 0.00–0.08)

## 2014-05-27 LAB — CBC
HCT: 38.3 % — ABNORMAL LOW (ref 39.0–52.0)
HEMOGLOBIN: 12.7 g/dL — AB (ref 13.0–17.0)
MCH: 37 pg — AB (ref 26.0–34.0)
MCHC: 33.2 g/dL (ref 30.0–36.0)
MCV: 111.7 fL — AB (ref 78.0–100.0)
Platelets: 193 10*3/uL (ref 150–400)
RBC: 3.43 MIL/uL — ABNORMAL LOW (ref 4.22–5.81)
RDW: 16 % — ABNORMAL HIGH (ref 11.5–15.5)
WBC: 6.7 10*3/uL (ref 4.0–10.5)

## 2014-05-27 LAB — COMPREHENSIVE METABOLIC PANEL
ALBUMIN: 2.8 g/dL — AB (ref 3.5–5.2)
ALK PHOS: 358 U/L — AB (ref 39–117)
ALT: 25 U/L (ref 0–53)
ANION GAP: 17 — AB (ref 5–15)
AST: 24 U/L (ref 0–37)
BUN: 23 mg/dL (ref 6–23)
CO2: 28 mmol/L (ref 19–32)
Calcium: 10.6 mg/dL — ABNORMAL HIGH (ref 8.4–10.5)
Chloride: 92 mmol/L — ABNORMAL LOW (ref 96–112)
Creatinine, Ser: 4.68 mg/dL — ABNORMAL HIGH (ref 0.50–1.35)
GFR, EST AFRICAN AMERICAN: 13 mL/min — AB (ref >90)
GFR, EST NON AFRICAN AMERICAN: 11 mL/min — AB (ref >90)
GLUCOSE: 105 mg/dL — AB (ref 70–99)
POTASSIUM: 5.1 mmol/L (ref 3.5–5.1)
Sodium: 137 mmol/L (ref 135–145)
Total Bilirubin: 2.3 mg/dL — ABNORMAL HIGH (ref 0.3–1.2)
Total Protein: 7.8 g/dL (ref 6.0–8.3)

## 2014-05-27 LAB — TYPE AND SCREEN
ABO/RH(D): O POS
Antibody Screen: POSITIVE
DAT, IGG: NEGATIVE

## 2014-05-27 LAB — PROTIME-INR
INR: 1.43 (ref 0.00–1.49)
Prothrombin Time: 17.6 seconds — ABNORMAL HIGH (ref 11.6–15.2)

## 2014-05-27 LAB — APTT: aPTT: 42 seconds — ABNORMAL HIGH (ref 24–37)

## 2014-05-27 LAB — POC OCCULT BLOOD, ED: Fecal Occult Bld: NEGATIVE

## 2014-05-27 MED ORDER — METOPROLOL TARTRATE 1 MG/ML IV SOLN
5.0000 mg | Freq: Once | INTRAVENOUS | Status: AC
  Administered 2014-05-27: 5 mg via INTRAVENOUS
  Filled 2014-05-27: qty 5

## 2014-05-27 MED ORDER — HYDROCODONE-ACETAMINOPHEN 5-325 MG PO TABS
1.0000 | ORAL_TABLET | ORAL | Status: AC
  Administered 2014-05-27: 1 via ORAL
  Filled 2014-05-27: qty 1

## 2014-05-27 NOTE — ED Notes (Signed)
Pt reports rectal bleeding this morning, states bright colored blood in stool. Family also states generalized weakness and frequent falls recently. Abdomen soft and non tender to palpation. Bowel sounds present all quadrants. Pt is alert and answering questions appropriately.

## 2014-05-27 NOTE — ED Notes (Signed)
Portable at bedside 

## 2014-05-27 NOTE — Discharge Instructions (Signed)

## 2014-05-27 NOTE — ED Notes (Signed)
Dr. Lynelle DoctorKnapp at bedside updating family on results

## 2014-05-27 NOTE — ED Notes (Signed)
Pt c/o blood in stool and generalized weakness. Pt has had multiple falls. Pt reports symptoms ongoing for 6 months but did not disclose to family til about 3 weeks ago.

## 2014-05-27 NOTE — ED Notes (Signed)
Patient ambulated in the hallways- painful due to sores on bottom of foot. Patient sats 88-94% HR 114-121bpm. Dr. Lynelle DoctorKnapp made aware.

## 2014-05-27 NOTE — ED Provider Notes (Addendum)
CSN: [id number]     Arrival date & time 05/27/14  1027 History   First MD Initiated Contact with Patient 05/27/14 1042     Chief Complaint  Patient presents with  . Rectal Bleeding  . Weakness    Patient is a 74 y.o. male presenting with hematochezia and weakness. The history is provided by the patient.  Rectal Bleeding Quality:  Bright red Amount:  Moderate Duration:  3 weeks Timing:  Intermittent (Not every BM has blood.  Mostly occurring at the end of the bowel movement.) Similar prior episodes: no   Relieved by:  Nothing Worsened by:  Nothing tried Associated symptoms: abdominal pain (a little at the top of the stomach)   Associated symptoms: no dizziness, no light-headedness, no loss of consciousness and no vomiting   Risk factors: anticoagulant use   Weakness Associated symptoms include abdominal pain (a little at the top of the stomach).  Pt has had trouble with GI bleeds in the past.  Past Medical History  Diagnosis Date  . HTN (hypertension)   . Hypercholesteremia   . GERD (gastroesophageal reflux disease)   . Atrial fibrillation     Paroxysmal - 2 episodes related to ischemic NSTEMI events  5 & 09/2011  . Gout   . S/P coronary artery stent placement, 10/01/11 with DES to mid LAD and  PTCA of Diag 2 10/01/2009    Inferior STEMI 2011 - BMS to LCx; NSTEMI 5/13 with ISR/thrombosis of Cx BMS -->multiple DES to  LCx ; staged PCI of LAD /Balloon PTCA of major Diag 09/2011 for Sx (NSTEMI)   . Anemia   . Schatzki's ring   . Angiodysplasia of intestine   . Aortic stenosis, moderate     2D Echocardiogram 12/13 and 08/22/11 showed moderate aortic stenosis with valve area of 1.28 sq cm. EF of 45 to 50% with severe hypokinesia of the entire inferolateral myocardium  . Ulcer   . Heart murmur   . MI (myocardial infarction) 03/16/2011    Inferolateral ST segment elevation MI  . MI (myocardial infarction)     "he's had 4" (10/30/2013)  . History of blood transfusion ~ 2013    "several;  had bleeding in small intestines"  . Lower GI bleeding ~ 2013  . Arthritis     "legs" (10/30/2013)  . ESRD on hemodialysis     [place] HD, TTS, esrd due to HTN (10/30/2013)  . Right renal artery stenosis     renal stent in 1998  . Pneumonia 07/2013   Past Surgical History  Procedure Laterality Date  . Ureteral stent placement    . Colonoscopy  04/2009    with EGD by Dr.Buccini moderately large hiatal hernia could contribute to his anemia,Schatzki ring which might accout for his intermittent dysphagia/+internal hemorrhoids ;family hx of colon ca  . Esophagogastroduodenoscopy  03/2010    by Dr.Magod/small hiatal hernia with widley patent fibrous ring,minimal bulbitis otherwise nl  . Dialysis fistula creation    . Givens capsule study  04/16/10    SB erosions vs. AVMs, non-specific distal SB inflammation in setting ASA, plavix, NSAIDs  . Esophagogastroduodenoscopy  03/14/2012    Procedure: ESOPHAGOGASTRODUODENOSCOPY (EGD);  Surgeon: Iva Boop, MD;  Location: [facility] ENDOSCOPY;  Service: Endoscopy;  Laterality: N/A;  . Colonoscopy  03/16/2012    Procedure: COLONOSCOPY;  Surgeon: Iva Boop, MD;  Location: [facility] ENDOSCOPY;  Service: Endoscopy;  Laterality: N/A;  . Renal artery stent Right 08/22/1996    Dr [name], P-[id number]  Palmaz stent mounted on a 6mm x 2cm PowerFlex and deployed in the renal ostium at 7 atmospheres  reduction of 95% lesion to 0% residual without dissection    . Enteroscopy N/A 07/06/2012    Procedure: ENTEROSCOPY;  Surgeon: Hilarie Fredrickson, MD;  Location: [facility] ENDOSCOPY;  Service: Endoscopy;  Laterality: N/A;  . Doppler echocardiography  03/15/2012-[facility]    LV 45-50%, grade 2 diastolic dysfunction; mod AV stenosis; calcified MV annulus with mild MR; PA pressure  . Nm myocar perf wall motion  05/02/2010    EF 49% ; mod perfusion basal inferolateral, basal anterolateral,mid inferolat and mid anterolateral region  . Carotid doppler  04/20/2011    lright common iliac  artery0-49%,left common iliac artery 0-49%  . Renal angiogram  01/02/2004    C02 angiography-widely patent renal arteries  . Event monitor  09/02/2011-6/20/2/13    reason afib  . Coronary angioplasty with stent placement  08/2011 & 09/2011    Integrity Resolute DES Stents  Stent #1:  2.69mm x 14 mm (extending to just distal to the bifurcation to the mid OM, Stent #2:  2.33mm x 18mm  Stent #3:  3.33mm x 18mm; covering proximal portion of the old stent into the proximal circumflex and overlapping stent #2 distally, Stent #4: 2.58mm x 8mm (4 overlapping in LCx- OM1; 1 in mid LAD; PTCA only of Major Diag (small caliber)  . Coronary angioplasty with stent placement  2011, 2012    Dr [name]  03/16/11  re-cannulized totally occluded circumflex, stented with Integrity bare metal stent focal 90% stenosis in LAD and diagonal branch   . Cardiac catheterization  10/30/2013  . Esophagogastroduodenoscopy (egd) with esophageal dilation    . Left heart catheterization with coronary angiogram N/A 08/24/2011    Procedure: LEFT HEART CATHETERIZATION WITH CORONARY ANGIOGRAM;  Surgeon: Marykay Lex, MD;  Location: [facility] CATH LAB;  Service: Cardiovascular;  Laterality: N/A;  . Left heart catheterization with coronary angiogram N/A 10/01/2011    Procedure: LEFT HEART CATHETERIZATION WITH CORONARY ANGIOGRAM;  Surgeon: Marykay Lex, MD;  Location: [facility] CATH LAB;  Service: Cardiovascular;  Laterality: N/A;  . Left and right heart catheterization with coronary angiogram N/A 10/30/2013    Procedure: LEFT AND RIGHT HEART CATHETERIZATION WITH CORONARY ANGIOGRAM;  Surgeon: Runell Gess, MD;  Location: [facility] CATH LAB;  Service: Cardiovascular;  Laterality: N/A;  . Abdominal aortagram  10/30/2013    Procedure: ABDOMINAL AORTAGRAM;  Surgeon: Runell Gess, MD;  Location: [facility] CATH LAB;  Service: Cardiovascular;;   Family History  Problem Relation Age of Onset  . Coronary artery disease Father   . Hypertension Mother   .  Coronary artery disease Mother   . Colon cancer Brother 77   History  Substance Use Topics  . Smoking status: Former Smoker -- 0.50 packs/day for 45 years    Types: Cigarettes    Quit date: 04/13/1992  . Smokeless tobacco: Current User    Types: Chew  . Alcohol Use: No    Review of Systems  Gastrointestinal: Positive for abdominal pain (a little at the top of the stomach) and hematochezia. Negative for vomiting.  Neurological: Positive for weakness. Negative for dizziness, loss of consciousness and light-headedness.  All other systems reviewed and are negative.     Allergies  Diltiazem  Home Medications   Prior to Admission medications   Medication Sig Start Date End Date Taking? Authorizing Provider  acetaminophen (TYLENOL) 325 MG tablet Take 2 tablets (650 mg  total) by mouth every 4 (four) hours as needed for headache or mild pain. 10/31/13  Yes Luke K Kilroy, PA-C  allopurinol (ZYLOPRIM) 100 MG tablet Take 100 mg by mouth daily.    Yes Historical Provider, MD  amiodarone (PACERONE) 200 MG tablet Take 0.5 tablets (100 mg total) by mouth at bedtime. 01/15/14  Yes Runell GessJonathan J Berry, MD  cinacalcet (SENSIPAR) 30 MG tablet Take 30 mg by mouth daily. Tues Thursdays and saturdays   Yes Historical Provider, MD  clobetasol cream (TEMOVATE) 0.05 % Apply 1 application topically daily. For sores on arm 02/28/14  Yes Historical Provider, MD  metoprolol tartrate (LOPRESSOR) 25 MG tablet Take 12.5 mg by mouth 2 (two) times daily.   Yes Historical Provider, MD  multivitamin (RENA-VIT) TABS tablet Take 1 tablet by mouth every morning.    Yes Historical Provider, MD  PARoxetine (PAXIL) 20 MG tablet Take 20 mg by mouth daily.   Yes Historical Provider, MD  prasugrel (EFFIENT) 10 MG TABS tablet Take 1 tablet (10 mg total) by mouth daily. 04/18/14  Yes Runell GessJonathan J Berry, MD  ranitidine (ZANTAC) 150 MG tablet Take 150 mg by mouth daily.   Yes Historical Provider, MD  sevelamer (RENVELA) 800 MG tablet  Take 2,400 mg by mouth 3 (three) times daily with meals.   Yes Historical Provider, MD  nitroGLYCERIN (NITROSTAT) 0.4 MG SL tablet Place 0.4 mg under the tongue every 5 (five) minutes as needed for chest pain.    Historical Provider, MD  pantoprazole (PROTONIX) 40 MG tablet Take 1 tablet (40 mg total) by mouth daily. Patient not taking: Reported on 05/27/2014 05/09/14   Runell GessJonathan J Berry, MD   BP 110/83 mmHg  Pulse 125  Temp(Src) 97.5 F (36.4 C) (Oral)  Resp 21  Ht 5\' 9"  (1.753 m)  Wt 179 lb (81.194 kg)  BMI 26.42 kg/m2  SpO2 95% Physical Exam  Constitutional: No distress.  HENT:  Head: Normocephalic and atraumatic.  Right Ear: External ear normal.  Left Ear: External ear normal.  Eyes: Conjunctivae are normal. Right eye exhibits no discharge. Left eye exhibits no discharge. No scleral icterus.  Neck: Neck supple. No tracheal deviation present.  Cardiovascular: Intact distal pulses.  An irregularly irregular rhythm present. Tachycardia present.   Pulmonary/Chest: Effort normal and breath sounds normal. No stridor. No respiratory distress. He has no wheezes. He has no rales.  Abdominal: Soft. Bowel sounds are normal. He exhibits no distension. There is no tenderness. There is no rebound and no guarding.  Genitourinary: Rectal exam shows external hemorrhoid. Rectal exam shows no mass and no tenderness.  Brown stool, no visible blood   Musculoskeletal: He exhibits edema (bilateral feet and ankles). He exhibits no tenderness.  Neurological: He is alert. He has normal strength. No cranial nerve deficit (no facial droop, extraocular movements intact, no slurred speech) or sensory deficit. He exhibits normal muscle tone. He displays no seizure activity. Coordination normal.  Skin: Skin is warm and dry. No rash noted. He is not diaphoretic.  Psychiatric: He has a normal mood and affect.  Nursing note and vitals reviewed.   ED Course  Procedures (including critical care time) Labs  Review Labs Reviewed  CBC - Abnormal; Notable for the following:    RBC 3.43 (*)    Hemoglobin 12.7 (*)    HCT 38.3 (*)    MCV 111.7 (*)    MCH 37.0 (*)    RDW 16.0 (*)    All other components within normal limits  COMPREHENSIVE METABOLIC PANEL - Abnormal; Notable for the following:    Chloride 92 (*)    Glucose, Bld 105 (*)    Creatinine, Ser 4.68 (*)    Calcium 10.6 (*)    Albumin 2.8 (*)    Alkaline Phosphatase 358 (*)    Total Bilirubin 2.3 (*)    GFR calc non Af Amer 11 (*)    GFR calc Af Amer 13 (*)    Anion gap 17 (*)    All other components within normal limits  APTT - Abnormal; Notable for the following:    aPTT 42 (*)    All other components within normal limits  PROTIME-INR - Abnormal; Notable for the following:    Prothrombin Time 17.6 (*)    All other components within normal limits  POC OCCULT BLOOD, ED  I-STAT TROPOININ, ED  TYPE AND SCREEN    Imaging Review Dg Chest Portable 1 View  05/27/2014   CLINICAL DATA:  Generalized weakness.  Multiple falls.  EXAM: PORTABLE CHEST - 1 VIEW  COMPARISON:  07/15/2013  FINDINGS: Monitoring leads overlie the patient. Stable enlarged cardiac and mediastinal contours. Tortuosity of the thoracic aorta. Interval development of a small right pleural effusion. Heterogeneous opacities right lung base. Probable minimal atelectasis left lung base.  IMPRESSION: New small right pleural effusion with underlying opacities favored to represent atelectasis. Infection is not excluded.   Electronically Signed   By: Annia Belt M.D.   On: 05/27/2014 12:29     EKG Interpretation   Date/Time:  Sunday May 27 2014 11:13:53 EST Ventricular Rate:  116 PR Interval:    QRS Duration: 121 QT Interval:  373 QTC Calculation: 518 R Axis:   155 Text Interpretation:  Atrial fibrillation Right bundle branch block  Nonspecific T abnormalities, lateral leads Since last tracing rate faster  Confirmed by Larhonda Dettloff  MD-J, Leyda Vanderwerf ([id number]) on 05/27/2014  11:37:49 AM      MDM   Final diagnoses:  Chronic atrial fibrillation  Chronic renal failure, unspecified stage    Patient has had a  persistent tachycardia.  He does have atrial fibrillation and is on beta blockers. Patient was given an additional dose. Patient's x-rays not showing any pulmonary edema. he does have chronic issues with pulmonary hypertension and valvular disease. Think these factors are contributing. The patient has been able to walk around the emergency department. He is not feeling short of breath or weak. I think it is reasonable for him to go home with close outpatient follow-up.  He is concerned about possible GI bleed. His hemoglobin is essentially normal. He has no blood in the stool. Does not appear to have evidence of any acute GI bleeding at this time.  Patient did ask about some issues that he is having with a persistent rash. He has been evaluated by dermatology. The patient has some soreness in his rash and asked for a dose of pain medication while is here. None of these areas look like an acute infection. Most of the areas are consistent with excoriation.    Linwood Dibbles, MD 05/27/14 1411  Linwood Dibbles, MD 05/27/14 [phone]

## 2014-05-31 ENCOUNTER — Encounter (HOSPITAL_COMMUNITY): Payer: Self-pay

## 2014-05-31 ENCOUNTER — Inpatient Hospital Stay (HOSPITAL_COMMUNITY)
Admission: EM | Admit: 2014-05-31 | Discharge: 2014-06-05 | DRG: 602 | Disposition: A | Discharge status: Hospice | Payer: Medicare Other | Attending: Internal Medicine | Admitting: Internal Medicine

## 2014-05-31 ENCOUNTER — Emergency Department (HOSPITAL_COMMUNITY): Payer: Medicare Other

## 2014-05-31 DIAGNOSIS — Z79899 Other long term (current) drug therapy: Secondary | ICD-10-CM | POA: Diagnosis not present

## 2014-05-31 DIAGNOSIS — E780 Pure hypercholesterolemia: Secondary | ICD-10-CM | POA: Diagnosis present

## 2014-05-31 DIAGNOSIS — F17220 Nicotine dependence, chewing tobacco, uncomplicated: Secondary | ICD-10-CM | POA: Diagnosis present

## 2014-05-31 DIAGNOSIS — M1990 Unspecified osteoarthritis, unspecified site: Secondary | ICD-10-CM | POA: Diagnosis present

## 2014-05-31 DIAGNOSIS — I2510 Atherosclerotic heart disease of native coronary artery without angina pectoris: Secondary | ICD-10-CM | POA: Diagnosis present

## 2014-05-31 DIAGNOSIS — D631 Anemia in chronic kidney disease: Secondary | ICD-10-CM | POA: Diagnosis present

## 2014-05-31 DIAGNOSIS — E785 Hyperlipidemia, unspecified: Secondary | ICD-10-CM | POA: Diagnosis present

## 2014-05-31 DIAGNOSIS — R001 Bradycardia, unspecified: Secondary | ICD-10-CM | POA: Diagnosis present

## 2014-05-31 DIAGNOSIS — R945 Abnormal results of liver function studies: Secondary | ICD-10-CM

## 2014-05-31 DIAGNOSIS — Z66 Do not resuscitate: Secondary | ICD-10-CM | POA: Diagnosis present

## 2014-05-31 DIAGNOSIS — I4510 Unspecified right bundle-branch block: Secondary | ICD-10-CM | POA: Diagnosis present

## 2014-05-31 DIAGNOSIS — Z515 Encounter for palliative care: Secondary | ICD-10-CM

## 2014-05-31 DIAGNOSIS — L03115 Cellulitis of right lower limb: Principal | ICD-10-CM | POA: Diagnosis present

## 2014-05-31 DIAGNOSIS — Z955 Presence of coronary angioplasty implant and graft: Secondary | ICD-10-CM | POA: Diagnosis not present

## 2014-05-31 DIAGNOSIS — I4891 Unspecified atrial fibrillation: Secondary | ICD-10-CM | POA: Insufficient documentation

## 2014-05-31 DIAGNOSIS — I959 Hypotension, unspecified: Secondary | ICD-10-CM | POA: Diagnosis not present

## 2014-05-31 DIAGNOSIS — Z992 Dependence on renal dialysis: Secondary | ICD-10-CM

## 2014-05-31 DIAGNOSIS — L03119 Cellulitis of unspecified part of limb: Secondary | ICD-10-CM | POA: Insufficient documentation

## 2014-05-31 DIAGNOSIS — K5520 Angiodysplasia of colon without hemorrhage: Secondary | ICD-10-CM

## 2014-05-31 DIAGNOSIS — R7989 Other specified abnormal findings of blood chemistry: Secondary | ICD-10-CM

## 2014-05-31 DIAGNOSIS — R627 Adult failure to thrive: Secondary | ICD-10-CM | POA: Diagnosis present

## 2014-05-31 DIAGNOSIS — Z888 Allergy status to other drugs, medicaments and biological substances status: Secondary | ICD-10-CM

## 2014-05-31 DIAGNOSIS — I480 Paroxysmal atrial fibrillation: Secondary | ICD-10-CM | POA: Diagnosis present

## 2014-05-31 DIAGNOSIS — I482 Chronic atrial fibrillation: Secondary | ICD-10-CM | POA: Diagnosis present

## 2014-05-31 DIAGNOSIS — L03116 Cellulitis of left lower limb: Secondary | ICD-10-CM | POA: Diagnosis present

## 2014-05-31 DIAGNOSIS — N186 End stage renal disease: Secondary | ICD-10-CM | POA: Insufficient documentation

## 2014-05-31 DIAGNOSIS — I255 Ischemic cardiomyopathy: Secondary | ICD-10-CM | POA: Diagnosis present

## 2014-05-31 DIAGNOSIS — M7989 Other specified soft tissue disorders: Secondary | ICD-10-CM | POA: Diagnosis present

## 2014-05-31 DIAGNOSIS — N184 Chronic kidney disease, stage 4 (severe): Secondary | ICD-10-CM | POA: Insufficient documentation

## 2014-05-31 DIAGNOSIS — I350 Nonrheumatic aortic (valve) stenosis: Secondary | ICD-10-CM | POA: Diagnosis present

## 2014-05-31 DIAGNOSIS — K219 Gastro-esophageal reflux disease without esophagitis: Secondary | ICD-10-CM | POA: Diagnosis present

## 2014-05-31 DIAGNOSIS — I252 Old myocardial infarction: Secondary | ICD-10-CM

## 2014-05-31 DIAGNOSIS — M109 Gout, unspecified: Secondary | ICD-10-CM | POA: Diagnosis present

## 2014-05-31 DIAGNOSIS — I10 Essential (primary) hypertension: Secondary | ICD-10-CM | POA: Diagnosis present

## 2014-05-31 DIAGNOSIS — N185 Chronic kidney disease, stage 5: Secondary | ICD-10-CM

## 2014-05-31 DIAGNOSIS — I739 Peripheral vascular disease, unspecified: Secondary | ICD-10-CM | POA: Diagnosis present

## 2014-05-31 HISTORY — DX: Atherosclerotic heart disease of native coronary artery without angina pectoris: I25.10

## 2014-05-31 LAB — COMPREHENSIVE METABOLIC PANEL
ALT: 47 U/L (ref 0–53)
AST: 61 U/L — ABNORMAL HIGH (ref 0–37)
Albumin: 2.5 g/dL — ABNORMAL LOW (ref 3.5–5.2)
Alkaline Phosphatase: 256 U/L — ABNORMAL HIGH (ref 39–117)
Anion gap: 15 (ref 5–15)
BILIRUBIN TOTAL: 1.7 mg/dL — AB (ref 0.3–1.2)
BUN: 17 mg/dL (ref 6–23)
CO2: 27 mmol/L (ref 19–32)
CREATININE: 4.05 mg/dL — AB (ref 0.50–1.35)
Calcium: 9.3 mg/dL (ref 8.4–10.5)
Chloride: 99 mmol/L (ref 96–112)
GFR calc non Af Amer: 13 mL/min — ABNORMAL LOW (ref >90)
GFR, EST AFRICAN AMERICAN: 16 mL/min — AB (ref >90)
GLUCOSE: 93 mg/dL (ref 70–99)
Potassium: 4.2 mmol/L (ref 3.5–5.1)
Sodium: 141 mmol/L (ref 135–145)
TOTAL PROTEIN: 7.4 g/dL (ref 6.0–8.3)

## 2014-05-31 LAB — CBC WITH DIFFERENTIAL/PLATELET
BASOS PCT: 1 % (ref 0–1)
Basophils Absolute: 0.1 10*3/uL (ref 0.0–0.1)
EOS PCT: 7 % — AB (ref 0–5)
Eosinophils Absolute: 0.4 10*3/uL (ref 0.0–0.7)
HCT: 38.4 % — ABNORMAL LOW (ref 39.0–52.0)
Hemoglobin: 12.9 g/dL — ABNORMAL LOW (ref 13.0–17.0)
Lymphocytes Relative: 19 % (ref 12–46)
Lymphs Abs: 1 10*3/uL (ref 0.7–4.0)
MCH: 37.7 pg — ABNORMAL HIGH (ref 26.0–34.0)
MCHC: 33.6 g/dL (ref 30.0–36.0)
MCV: 112.3 fL — AB (ref 78.0–100.0)
MONO ABS: 0.5 10*3/uL (ref 0.1–1.0)
Monocytes Relative: 10 % (ref 3–12)
Neutro Abs: 3.3 10*3/uL (ref 1.7–7.7)
Neutrophils Relative %: 63 % (ref 43–77)
PLATELETS: 196 10*3/uL (ref 150–400)
RBC: 3.42 MIL/uL — AB (ref 4.22–5.81)
RDW: 16.3 % — ABNORMAL HIGH (ref 11.5–15.5)
WBC: 5.3 10*3/uL (ref 4.0–10.5)

## 2014-05-31 LAB — TSH: TSH: 5.326 u[IU]/mL — ABNORMAL HIGH (ref 0.350–4.500)

## 2014-05-31 MED ORDER — PAROXETINE HCL 20 MG PO TABS
20.0000 mg | ORAL_TABLET | Freq: Every day | ORAL | Status: DC
  Administered 2014-05-31 – 2014-06-05 (×6): 20 mg via ORAL
  Filled 2014-05-31 (×6): qty 1

## 2014-05-31 MED ORDER — HEPARIN SODIUM (PORCINE) 1000 UNIT/ML DIALYSIS
20.0000 [IU]/kg | INTRAMUSCULAR | Status: DC | PRN
  Filled 2014-05-31: qty 2

## 2014-05-31 MED ORDER — METOPROLOL TARTRATE 12.5 MG HALF TABLET
12.5000 mg | ORAL_TABLET | Freq: Two times a day (BID) | ORAL | Status: DC
  Administered 2014-05-31 – 2014-06-01 (×3): 12.5 mg via ORAL
  Filled 2014-05-31 (×5): qty 1

## 2014-05-31 MED ORDER — TEMAZEPAM 15 MG PO CAPS: 15.0000 mg | ORAL_CAPSULE | Freq: Every evening | ORAL | Status: DC | PRN

## 2014-05-31 MED ORDER — VANCOMYCIN HCL 10 G IV SOLR
1750.0000 mg | Freq: Once | INTRAVENOUS | Status: AC
  Administered 2014-05-31: 1750 mg via INTRAVENOUS
  Filled 2014-05-31: qty 1750

## 2014-05-31 MED ORDER — HEPARIN SODIUM (PORCINE) 1000 UNIT/ML DIALYSIS
1000.0000 [IU] | INTRAMUSCULAR | Status: DC | PRN
  Filled 2014-05-31: qty 1

## 2014-05-31 MED ORDER — CLOBETASOL PROPIONATE 0.05 % EX CREA
1.0000 "application " | TOPICAL_CREAM | Freq: Every day | CUTANEOUS | Status: DC
  Administered 2014-05-31 – 2014-06-04 (×5): 1 via TOPICAL
  Filled 2014-05-31 (×3): qty 15

## 2014-05-31 MED ORDER — SENNA 8.6 MG PO TABS
1.0000 | ORAL_TABLET | Freq: Two times a day (BID) | ORAL | Status: DC
  Administered 2014-05-31 – 2014-06-05 (×9): 8.6 mg via ORAL
  Filled 2014-05-31 (×11): qty 1

## 2014-05-31 MED ORDER — AMIODARONE HCL 100 MG PO TABS
100.0000 mg | ORAL_TABLET | Freq: Every day | ORAL | Status: DC
  Administered 2014-05-31 – 2014-06-01 (×2): 100 mg via ORAL
  Filled 2014-05-31 (×4): qty 1

## 2014-05-31 MED ORDER — MORPHINE SULFATE 2 MG/ML IJ SOLN
1.0000 mg | INTRAMUSCULAR | Status: DC | PRN
  Filled 2014-05-31: qty 1

## 2014-05-31 MED ORDER — CINACALCET HCL 30 MG PO TABS
30.0000 mg | ORAL_TABLET | ORAL | Status: DC
  Administered 2014-05-31 – 2014-06-02 (×2): 30 mg via ORAL
  Filled 2014-05-31 (×3): qty 1

## 2014-05-31 MED ORDER — PIPERACILLIN-TAZOBACTAM 3.375 G IVPB 30 MIN
3.3750 g | Freq: Once | INTRAVENOUS | Status: AC
  Administered 2014-05-31: 3.375 g via INTRAVENOUS
  Filled 2014-05-31: qty 50

## 2014-05-31 MED ORDER — SODIUM CHLORIDE 0.9 % IV SOLN
250.0000 mL | INTRAVENOUS | Status: DC | PRN
Start: 2014-05-31 — End: 2014-06-05
  Administered 2014-05-31: 250 mL via INTRAVENOUS

## 2014-05-31 MED ORDER — ALTEPLASE 2 MG IJ SOLR
2.0000 mg | Freq: Once | INTRAMUSCULAR | Status: AC | PRN
  Filled 2014-05-31: qty 2

## 2014-05-31 MED ORDER — SODIUM CHLORIDE 0.9 % IV SOLN: 100.0000 mL | INTRAVENOUS | Status: DC | PRN

## 2014-05-31 MED ORDER — ONDANSETRON HCL 4 MG PO TABS: 4.0000 mg | ORAL_TABLET | Freq: Four times a day (QID) | ORAL | Status: DC | PRN

## 2014-05-31 MED ORDER — PIPERACILLIN-TAZOBACTAM IN DEX 2-0.25 GM/50ML IV SOLN
2.2500 g | Freq: Three times a day (TID) | INTRAVENOUS | Status: DC
  Administered 2014-06-01 – 2014-06-04 (×10): 2.25 g via INTRAVENOUS
  Filled 2014-05-31 (×15): qty 50

## 2014-05-31 MED ORDER — ASPIRIN EC 325 MG PO TBEC
325.0000 mg | DELAYED_RELEASE_TABLET | Freq: Every day | ORAL | Status: DC
  Administered 2014-05-31 – 2014-06-01 (×2): 325 mg via ORAL
  Filled 2014-05-31 (×2): qty 1

## 2014-05-31 MED ORDER — ACETAMINOPHEN 325 MG PO TABS: 650.0000 mg | ORAL_TABLET | Freq: Four times a day (QID) | ORAL | Status: DC | PRN

## 2014-05-31 MED ORDER — SODIUM CHLORIDE 0.9 % IJ SOLN: 3.0000 mL | INTRAMUSCULAR | Status: DC | PRN

## 2014-05-31 MED ORDER — ONDANSETRON HCL 4 MG/2ML IJ SOLN: 4.0000 mg | Freq: Four times a day (QID) | INTRAMUSCULAR | Status: DC | PRN

## 2014-05-31 MED ORDER — FOLIC ACID 1 MG PO TABS
1.0000 mg | ORAL_TABLET | Freq: Every day | ORAL | Status: DC
  Administered 2014-05-31 – 2014-06-05 (×6): 1 mg via ORAL
  Filled 2014-05-31 (×6): qty 1

## 2014-05-31 MED ORDER — SODIUM CHLORIDE 0.9 % IJ SOLN
3.0000 mL | Freq: Two times a day (BID) | INTRAMUSCULAR | Status: DC
  Administered 2014-06-04 (×2): 3 mL via INTRAVENOUS

## 2014-05-31 MED ORDER — SEVELAMER CARBONATE 800 MG PO TABS
2400.0000 mg | ORAL_TABLET | Freq: Three times a day (TID) | ORAL | Status: DC
  Administered 2014-06-01 – 2014-06-03 (×4): 2400 mg via ORAL
  Filled 2014-05-31 (×11): qty 3

## 2014-05-31 MED ORDER — HEPARIN SODIUM (PORCINE) 5000 UNIT/ML IJ SOLN
5000.0000 [IU] | Freq: Three times a day (TID) | INTRAMUSCULAR | Status: DC
  Administered 2014-05-31 – 2014-06-05 (×14): 5000 [IU] via SUBCUTANEOUS
  Filled 2014-05-31 (×18): qty 1

## 2014-05-31 MED ORDER — VANCOMYCIN HCL IN DEXTROSE 1-5 GM/200ML-% IV SOLN: 1000.0000 mg | INTRAVENOUS | Status: DC

## 2014-05-31 MED ORDER — FAMOTIDINE 20 MG PO TABS
20.0000 mg | ORAL_TABLET | Freq: Every day | ORAL | Status: DC
Start: 2014-05-31 — End: 2014-06-05
  Administered 2014-05-31 – 2014-06-05 (×6): 20 mg via ORAL
  Filled 2014-05-31 (×6): qty 1

## 2014-05-31 MED ORDER — PENTAFLUOROPROP-TETRAFLUOROETH EX AERO: 1.0000 "application " | INHALATION_SPRAY | CUTANEOUS | Status: DC | PRN

## 2014-05-31 MED ORDER — OXYCODONE HCL 5 MG PO TABS
5.0000 mg | ORAL_TABLET | ORAL | Status: DC | PRN
  Administered 2014-05-31: 5 mg via ORAL
  Filled 2014-05-31: qty 1

## 2014-05-31 MED ORDER — NITROGLYCERIN 0.4 MG SL SUBL: 0.4000 mg | SUBLINGUAL_TABLET | SUBLINGUAL | Status: DC | PRN

## 2014-05-31 MED ORDER — ACETAMINOPHEN 650 MG RE SUPP: 650.0000 mg | Freq: Four times a day (QID) | RECTAL | Status: DC | PRN

## 2014-05-31 MED ORDER — ALLOPURINOL 100 MG PO TABS
100.0000 mg | ORAL_TABLET | Freq: Every day | ORAL | Status: DC
  Administered 2014-05-31 – 2014-06-05 (×6): 100 mg via ORAL
  Filled 2014-05-31 (×6): qty 1

## 2014-05-31 MED ORDER — LIDOCAINE-PRILOCAINE 2.5-2.5 % EX CREA
1.0000 "application " | TOPICAL_CREAM | CUTANEOUS | Status: DC | PRN
  Filled 2014-05-31: qty 5

## 2014-05-31 MED ORDER — VITAMIN B-1 100 MG PO TABS
100.0000 mg | ORAL_TABLET | Freq: Every day | ORAL | Status: DC
  Administered 2014-05-31 – 2014-06-05 (×6): 100 mg via ORAL
  Filled 2014-05-31 (×6): qty 1

## 2014-05-31 MED ORDER — NEPRO/CARBSTEADY PO LIQD
237.0000 mL | ORAL | Status: DC | PRN
  Filled 2014-05-31: qty 237

## 2014-05-31 MED ORDER — SODIUM CHLORIDE 0.9 % IJ SOLN
3.0000 mL | Freq: Two times a day (BID) | INTRAMUSCULAR | Status: DC
  Administered 2014-06-01 – 2014-06-04 (×4): 3 mL via INTRAVENOUS

## 2014-05-31 MED ORDER — PRASUGREL HCL 10 MG PO TABS
10.0000 mg | ORAL_TABLET | Freq: Every day | ORAL | Status: DC
  Administered 2014-05-31 – 2014-06-05 (×6): 10 mg via ORAL
  Filled 2014-05-31 (×6): qty 1

## 2014-05-31 MED ORDER — ADULT MULTIVITAMIN W/MINERALS CH
1.0000 | ORAL_TABLET | Freq: Every day | ORAL | Status: DC
  Administered 2014-05-31 – 2014-06-05 (×5): 1 via ORAL
  Filled 2014-05-31 (×6): qty 1

## 2014-05-31 MED ORDER — OXYCODONE HCL 5 MG PO TABS
5.0000 mg | ORAL_TABLET | ORAL | Status: DC | PRN
  Administered 2014-06-01: 5 mg via ORAL
  Administered 2014-06-02: 10 mg via ORAL
  Filled 2014-05-31: qty 1
  Filled 2014-05-31: qty 2

## 2014-05-31 MED ORDER — LIDOCAINE HCL (PF) 1 % IJ SOLN: 5.0000 mL | INTRAMUSCULAR | Status: DC | PRN

## 2014-05-31 NOTE — ED Notes (Signed)
Transporting patient to new room assignment. 

## 2014-05-31 NOTE — ED Provider Notes (Signed)
CSN: [id number]     Arrival date & time 05/31/14  1411 History   First MD Initiated Contact with Patient 05/31/14 1507     Chief Complaint  Patient presents with  . Rash  . Failure To Thrive   Roger [name] is a 74 y.o. 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 Patient was sent here by his nephrologist Dr. Lowell Guitar for evaluation for his sores and failure to thrive. The patient was seen here on 05/27/14 and has had a decline since his last evaluation. He reports his rectal bleeding has stopped but he is now unable to walk due to weakness and pain in his legs and weakness in his arms. Patient has lesions on his upper and lower extremities And evaluated by dermatology approximately 3 months ago and reports it is not skin cancer. They report these have worsened since. He is now complaining of swelling in his legs worsening rashes on his feet. The patient is not on home oxygen but his oxygen saturation was decreased without oxygen according to nursing staff. He reports decreased appetite, and has eaten a small amount of food today. He denies fevers, chills, chest pain, palpitations, shortness of breath, numbness, tingling, lightheadedness, dizziness, dysuria, SI, abdominal pain, nausea, or vomiting.   (Consider location/radiation/quality/duration/timing/severity/associated sxs/prior Treatment) HPI  Past Medical History  Diagnosis Date  . HTN (hypertension)   . Hypercholesteremia   . GERD (gastroesophageal reflux disease)   . Atrial fibrillation     Paroxysmal - 2 episodes related to ischemic NSTEMI events  5 & 09/2011  . Gout   . S/P coronary artery stent placement, 10/01/11 with DES to mid LAD and  PTCA of Diag 2 10/01/2009    Inferior STEMI 2011 - BMS to LCx; NSTEMI 5/13 with  ISR/thrombosis of Cx BMS -->multiple DES to  LCx ; staged PCI of LAD /Balloon PTCA of major Diag 09/2011 for Sx (NSTEMI)   . Anemia   . Schatzki's ring   . Angiodysplasia of intestine   . Aortic stenosis, moderate     2D Echocardiogram 12/13 and 08/22/11 showed moderate aortic stenosis with valve area of 1.28 sq cm. EF of 45 to 50% with severe hypokinesia of the entire inferolateral myocardium  . Ulcer   . Heart murmur   . MI (myocardial infarction) 03/16/2011    Inferolateral ST segment elevation MI  . MI (myocardial infarction)     "he's had 4" (10/30/2013)  . History of blood transfusion ~ 2013    "several; had bleeding in small intestines"  . Lower GI bleeding ~ 2013  . Arthritis     "legs" (10/30/2013)  . ESRD on hemodialysis     [place] [facility], TTS, esrd due to HTN (10/30/2013)  . Right renal artery stenosis     renal stent in 1998  . Pneumonia 07/2013   Past Surgical History  Procedure Laterality Date  . Ureteral stent placement    . Colonoscopy  04/2009    with EGD by Dr.Buccini moderately large hiatal hernia could contribute to his anemia,Schatzki ring which might accout for his intermittent dysphagia/+internal hemorrhoids ;family hx of colon ca  . Esophagogastroduodenoscopy  03/2010    by Dr.Magod/small hiatal hernia with widley patent fibrous ring,minimal bulbitis otherwise nl  . Dialysis fistula creation    . Givens  capsule study  04/16/10    SB erosions vs. AVMs, non-specific distal SB inflammation in setting ASA, plavix, NSAIDs  . Esophagogastroduodenoscopy  03/14/2012    Procedure: ESOPHAGOGASTRODUODENOSCOPY (EGD);  Surgeon: Iva Booparl E Gessner, MD;  [facility];  Service: Endoscopy;  Laterality: N/A;  . Colonoscopy  03/16/2012    Procedure: COLONOSCOPY;  Surgeon: Iva Booparl E Gessner, MD;  [facility];  Service: Endoscopy;  Laterality: N/A;  . Renal artery stent Right 08/22/1996    Dr Nanetta BattyJonathan Berry, P-154 Palmaz stent mounted on a 6mm x 2cm PowerFlex and deployed  in the renal ostium at 7 atmospheres  reduction of 95% lesion to 0% residual without dissection    . Enteroscopy N/A 07/06/2012    Procedure: ENTEROSCOPY;  Surgeon: Hilarie FredricksonJohn N Perry, MD;  [facility];  Service: Endoscopy;  Laterality: N/A;  . Doppler echocardiography  03/15/2012-[facility]    LV 45-50%, grade 2 diastolic dysfunction; mod AV stenosis; calcified MV annulus with mild MR; PA pressure 64mmHg  . Nm myocar perf wall motion  05/02/2010    EF 49% ; mod perfusion basal inferolateral, basal anterolateral,mid inferolat and mid anterolateral region  . Carotid doppler  04/20/2011    lright common iliac artery0-49%,left common iliac artery 0-49%  . Renal angiogram  01/02/2004    C02 angiography-widely patent renal arteries  . Event monitor  09/02/2011-6/20/2/13    reason afib  . Coronary angioplasty with stent placement  08/2011 & 09/2011    Integrity Resolute DES Stents  Stent #1:  2.1125mm x 14 mm (extending to just distal to the bifurcation to the mid OM, Stent #2:  2.1975mm x 18mm  Stent #3:  3.20mm x 18mm; covering proximal portion of the old stent into the proximal circumflex and overlapping stent #2 distally, Stent #4: 2.5025mm x 8mm (4 overlapping in LCx- OM1; 1 in mid LAD; PTCA only of Major Diag (small caliber)  . Coronary angioplasty with stent placement  2011, 2012    Dr Nanetta BattyJonathan Berry  03/16/11  re-cannulized totally occluded circumflex, stented with Integrity bare metal stent focal 90% stenosis in LAD and diagonal branch   . Cardiac catheterization  10/30/2013  . Esophagogastroduodenoscopy (egd) with esophageal dilation    . Left heart catheterization with coronary angiogram N/A 08/24/2011    Procedure: LEFT HEART CATHETERIZATION WITH CORONARY ANGIOGRAM;  Surgeon: Marykay Lexavid W Harding, MD;  [facility];  Service: Cardiovascular;  Laterality: N/A;  . Left heart catheterization with coronary angiogram N/A 10/01/2011    Procedure: LEFT HEART CATHETERIZATION WITH CORONARY ANGIOGRAM;   Surgeon: Marykay Lexavid W Harding, MD;  [facility];  Service: Cardiovascular;  Laterality: N/A;  . Left and right heart catheterization with coronary angiogram N/A 10/30/2013    Procedure: LEFT AND RIGHT HEART CATHETERIZATION WITH CORONARY ANGIOGRAM;  Surgeon: Runell GessJonathan J Berry, MD;  [facility];  Service: Cardiovascular;  Laterality: N/A;  . Abdominal aortagram  10/30/2013    Procedure: ABDOMINAL AORTAGRAM;  Surgeon: Runell GessJonathan J Berry, MD;  [facility];  Service: Cardiovascular;;   Family History  Problem Relation Age of Onset  . Coronary artery disease Father   . Hypertension Mother   . Coronary artery disease Mother   . Colon cancer Brother [id number]   History  Substance Use Topics  . Smoking status: Former Smoker -- 0.50 packs/day for 45 years    Types: Cigarettes    Quit date: 04/13/1992  . Smokeless tobacco: Current User    Types: Chew  .  Alcohol Use: No    Review of Systems  Constitutional: Positive for appetite change and fatigue. Negative for fever and chills.  HENT: Negative for congestion and sore throat.   Eyes: Negative for visual disturbance.  Respiratory: Negative for cough, shortness of breath and wheezing.   Cardiovascular: Positive for leg swelling. Negative for chest pain and palpitations.  Gastrointestinal: Negative for nausea, vomiting, abdominal pain, diarrhea and blood in stool.  Genitourinary: Negative for dysuria.  Musculoskeletal: Negative for back pain and neck pain.  Skin: Positive for color change, rash and wound.  Neurological: Positive for weakness. Negative for light-headedness, numbness and headaches.  Psychiatric/Behavioral: Negative for suicidal ideas.      Allergies  Diltiazem  Home Medications   Prior to Admission medications   Medication Sig Start Date End Date Taking? Authorizing Provider  acetaminophen (TYLENOL) 325 MG tablet Take 2 tablets (650 mg total) by mouth every 4 (four) hours as needed for headache or mild pain.  10/31/13  Yes Luke K Kilroy, PA-C  acetaminophen-codeine (TYLENOL #3) 300-30 MG per tablet Take 1 tablet by mouth every 4 (four) hours as needed for moderate pain.   Yes Historical Provider, MD  allopurinol (ZYLOPRIM) 100 MG tablet Take 100 mg by mouth daily.    Yes Historical Provider, MD  amiodarone (PACERONE) 200 MG tablet Take 0.5 tablets (100 mg total) by mouth at bedtime. 01/15/14  Yes Runell Gess, MD  cinacalcet (SENSIPAR) 30 MG tablet Take 30 mg by mouth daily. Tues Thursdays and saturdays   Yes Historical Provider, MD  clobetasol cream (TEMOVATE) 0.05 % Apply 1 application topically daily. For sores on arm 02/28/14  Yes Historical Provider, MD  doxycycline (VIBRA-TABS) 100 MG tablet Take 100 mg by mouth 2 (two) times daily. Started 05-28-14 for 5 days   Yes Historical Provider, MD  metoprolol tartrate (LOPRESSOR) 25 MG tablet Take 12.5 mg by mouth 2 (two) times daily.   Yes Historical Provider, MD  multivitamin (RENA-VIT) TABS tablet Take 1 tablet by mouth every morning.    Yes Historical Provider, MD  nitroGLYCERIN (NITROSTAT) 0.4 MG SL tablet Place 0.4 mg under the tongue every 5 (five) minutes as needed for chest pain.   Yes Historical Provider, MD  PARoxetine (PAXIL) 20 MG tablet Take 20 mg by mouth daily.   Yes Historical Provider, MD  prasugrel (EFFIENT) 10 MG TABS tablet Take 1 tablet (10 mg total) by mouth daily. 04/18/14  Yes Runell Gess, MD  ranitidine (ZANTAC) 150 MG tablet Take 150 mg by mouth daily.   Yes Historical Provider, MD  sevelamer (RENVELA) 800 MG tablet Take 2,400 mg by mouth 3 (three) times daily with meals.   Yes Historical Provider, MD  temazepam (RESTORIL) 15 MG capsule Take 15-30 mg by mouth at bedtime as needed for sleep.   Yes Historical Provider, MD  pantoprazole (PROTONIX) 40 MG tablet Take 1 tablet (40 mg total) by mouth daily. Patient not taking: Reported on 05/27/2014 05/09/14   Runell Gess, MD   BP 99/67 mmHg  Pulse 109  Temp(Src) 98.1 F (36.7  C) (Oral)  Resp 20  Ht 5\' 6"  (1.676 m)  Wt 173 lb 1 oz (78.5 kg)  BMI 27.95 kg/m2  SpO2 99% Physical Exam  Constitutional: He is oriented to person, place, and time. He appears well-developed and well-nourished. No distress.  HENT:  Head: Normocephalic and atraumatic.  Mouth/Throat: Oropharynx is clear and moist. No oropharyngeal exudate.  Eyes: Conjunctivae are normal. Pupils are equal, round,  and reactive to light. Right eye exhibits no discharge. Left eye exhibits no discharge.  Neck: Neck supple. No JVD present.  Cardiovascular: Normal rate, regular rhythm and intact distal pulses.   Murmur heard. Bilateral radial, posterior tibialis and dorsalis pedis pulses are intact.    Pulmonary/Chest: Effort normal. No respiratory distress. He has no wheezes. He has rales.  Scattered mild rales bilaterally.   Abdominal: Soft. Bowel sounds are normal. He exhibits no distension and no mass. There is no tenderness. There is no rebound and no guarding.  Musculoskeletal: He exhibits edema and tenderness.  Bilateral feet are edematous and erythematous with small lesions that look like scabs. His bilateral calves are not edematous but are tender to palpation. His bilateral feet are also tender to palpation.   Lymphadenopathy:    He has no cervical adenopathy.  Neurological: He is alert and oriented to person, place, and time. Coordination normal.  Skin: Skin is warm and dry. Rash noted. He is not diaphoretic. No erythema. No pallor.  Multiple lesions to his bilateral upper and lower extremities that are scabbed over. Bilateral feet are edematous and erythematous.  There are no rashes on his truck, back or genitals.   Psychiatric: He has a normal mood and affect. His behavior is normal.  Nursing note and vitals reviewed.   ED Course  Procedures (including critical care time) Labs Review Labs Reviewed  COMPREHENSIVE METABOLIC PANEL - Abnormal; Notable for the following:    Creatinine, Ser 4.05  (*)    Albumin 2.5 (*)    AST 61 (*)    Alkaline Phosphatase 256 (*)    Total Bilirubin 1.7 (*)    GFR calc non Af Amer 13 (*)    GFR calc Af Amer 16 (*)    All other components within normal limits  CBC WITH DIFFERENTIAL/PLATELET - Abnormal; Notable for the following:    RBC 3.42 (*)    Hemoglobin 12.9 (*)    HCT 38.4 (*)    MCV 112.3 (*)    MCH 37.7 (*)    RDW 16.3 (*)    Eosinophils Relative 7 (*)    All other components within normal limits  TSH - Abnormal; Notable for the following:    TSH 5.326 (*)    All other components within normal limits  HEMOGLOBIN A1C  COMPREHENSIVE METABOLIC PANEL  CBC    Imaging Review Dg Chest 2 View  05/31/2014   CLINICAL DATA:  Rash on legs, failure to thrive, weakness, htn, gerd, afib, gout, anemia, multiple MI, esrd  EXAM: CHEST  2 VIEW  COMPARISON:  05/27/2014  FINDINGS: Bilateral interstitial thickening is noted similar to the most recent prior study but increased when compared older exams. Right pleural effusion partly obscures the right hemidiaphragm. Minimal left effusion. No pneumothorax.  Cardiac silhouette is mildly enlarged. Aorta is mildly uncoiled. No mediastinal or hilar masses.  Bony thorax is demineralized but grossly intact.  Bony thorax is demineralized but grossly intact.  IMPRESSION: 1. No significant change from the most recent prior study. 2. Small right minimal left pleural effusions. 3. Bilateral interstitial thickening. Mild interstitial edema should be considered in the proper clinical setting. No convincing pneumonia. 4. Mild stable cardiomegaly.   Electronically Signed   By: Amie Portland M.D.   On: 05/31/2014 17:43     EKG Interpretation   Date/Time:  Thursday May 31 2014 15:31:51 EST Ventricular Rate:  119 PR Interval:    QRS Duration: 122 QT Interval:  371 QTC  Calculation: 522 R Axis:   -134 Text Interpretation:  Atrial fibrillation Right bundle branch block  Nonspecific T abnormalities, lateral leads  since last tracing no  significant change Confirmed by [facility]  MD, ELLIOTT [phone]) on 05/31/2014  4:42:48 PM      Filed Vitals:   05/31/14 1942 05/31/14 2022 05/31/14 2150 05/31/14 2259  BP:  96/68 101/67 99/67  Pulse:  113 109   Temp:  98.1 F (36.7 C)    TempSrc:  Oral    Resp:  20    Height:   (1.676 m)    Weight:  173 lb 1 oz (78.5 kg)    SpO2: 96% 99%       MDM   Meds given in ED:  Medications  temazepam (RESTORIL) capsule 15-30 mg (not administered)  cinacalcet (SENSIPAR) tablet 30 mg (30 mg Oral Given 05/31/14 2121)  famotidine (PEPCID) tablet 20 mg (20 mg Oral Given 05/31/14 2121)  prasugrel (EFFIENT) tablet 10 mg (10 mg Oral Given 05/31/14 2120)  clobetasol cream (TEMOVATE) 0.05 % 1 application (1 application Topical Given 05/31/14 2300)  amiodarone (PACERONE) tablet 100 mg (100 mg Oral Given 05/31/14 2120)  metoprolol tartrate (LOPRESSOR) tablet 12.5 mg (12.5 mg Oral Given 05/31/14 2151)  nitroGLYCERIN (NITROSTAT) SL tablet 0.4 mg (not administered)  PARoxetine (PAXIL) tablet 20 mg (20 mg Oral Given 05/31/14 2120)  sevelamer carbonate (RENVELA) tablet 2,400 mg (not administered)  allopurinol (ZYLOPRIM) tablet 100 mg (100 mg Oral Given 05/31/14 2121)  heparin injection 5,000 Units (5,000 Units Subcutaneous Given 05/31/14 2150)  sodium chloride 0.9 % injection 3 mL (0 mLs Intravenous Duplicate 05/31/14 2200)  sodium chloride 0.9 % injection 3 mL (3 mLs Intravenous Not Given 05/31/14 2200)  sodium chloride 0.9 % injection 3 mL (not administered)  0.9 %  sodium chloride infusion (250 mLs Intravenous New Bag/Given 05/31/14 1936)  acetaminophen (TYLENOL) tablet 650 mg (not administered)    Or  acetaminophen (TYLENOL) suppository 650 mg (not administered)  morphine 2 MG/ML injection 1 mg (not administered)  ondansetron (ZOFRAN) tablet 4 mg (not administered)    Or  ondansetron (ZOFRAN) injection 4 mg (not administered)  senna (SENOKOT) tablet 8.6 mg (8.6 mg Oral Given 05/31/14  2119)  aspirin EC tablet 325 mg (325 mg Oral Given 05/31/14 2121)  folic acid (FOLVITE) tablet 1 mg (1 mg Oral Given 05/31/14 2120)  multivitamin with minerals tablet 1 tablet (1 tablet Oral Given 05/31/14 2120)  thiamine (VITAMIN B-1) tablet 100 mg (100 mg Oral Given 05/31/14 2120)  piperacillin-tazobactam (ZOSYN) IVPB 2.25 g (not administered)  pentafluoroprop-tetrafluoroeth (GEBAUERS) aerosol 1 application (not administered)  lidocaine (PF) (XYLOCAINE) 1 % injection 5 mL (not administered)  lidocaine-prilocaine (EMLA) cream 1 application (not administered)  0.9 %  sodium chloride infusion (not administered)  0.9 %  sodium chloride infusion (not administered)  feeding supplement (NEPRO CARB STEADY) liquid 237 mL (not administered)  heparin injection 1,000 Units (not administered)  alteplase (CATHFLO ACTIVASE) injection 2 mg (not administered)  heparin injection 1,600 Units (not administered)  oxyCODONE (Oxy IR/ROXICODONE) immediate release tablet 5-10 mg (not administered)  vancomycin (VANCOCIN) 1,750 mg in sodium chloride 0.9 % 500 mL IVPB (0 mg Intravenous Stopped 05/31/14 1808)  piperacillin-tazobactam (ZOSYN) IVPB 3.375 g (3.375 g Intravenous Transfusing/Transfer 05/31/14 2000)    Current Discharge Medication List      Final diagnoses:  Bilateral lower leg cellulitis   This is a 74 y.o. male with a history of hypertension, hyperlipidemia, MI with stents, atrial fibrillation,  anemia, ESRD on dialysis Tues, Thursday, Saturday and had dialysis this morning who presents to the ED complaining of sores on his extremities and increasing fatigue and weakness. Patient is afebrile and nontoxic-appearing. The patient is chronically tachycardic. The patient has lesions on his bilateral upper and lower extremities. On his bilateral lower feet he has evidence of cellulitis and edema. His white count is 5.3. Chest x-ray shows no significant change from his most recent study. Patient started on Vancomycin  in the ED and consulted for admission. Dr. Park Breed accepted this patient for admission.   This patient was discussed with and evaluated by Dr. Effie Shy who agrees with assessment and plan.      Lawana Chambers, PA-C 06/01/14 1610  Flint Melter, MD 06/01/14 [phone]

## 2014-05-31 NOTE — Progress Notes (Signed)
Pt arrived to unit in A.fib rate 120's-140'; then HR drops back down to around 118. M.D paged. No new orders given. Will cont to monitor.

## 2014-05-31 NOTE — ED Notes (Signed)
[place] removed. Patient on room air.

## 2014-05-31 NOTE — ED Notes (Signed)
Spoke with Dr. Lowell GuitarPowell about patient condition. States patient sent for sores but also failure to thrive. States patient has had a decline in mobility with increase in falls, decrease in appetite, and increased depression.  Patient denies SI at this time. Pt. appearance reflects this assessment.

## 2014-05-31 NOTE — Consult Note (Signed)
HPI: I was asked by Dr. Humphrey Rolls to see Roger [name] who is a 74 y.o. male who has a history of ESRD on HD on MWF at the [facility].  I saw him yesterday in [place] while receiving dialysis.  He complained of painful sores on his legs (right>left) and difficulty ambulating and falls of several weeks duration. He has seen a dermatologist for this and given topical agents. When seen yesterday we started him on doxycycline, tylenoL #3 and referred pt for physical therapy.   Today I received a phone call regarding his feet becoming more discolored, his family's concern about the increased weakness and falls.   In addition, there was apparently some discussion about stopping dialysis.  He has not had any fevers.  There has been deterioration in his ability to thrive over the past several months.  He has had depression in the past improved with medication.  Past Medical History  Diagnosis Date  . HTN (hypertension)   . Hypercholesteremia   . GERD (gastroesophageal reflux disease)   . Atrial fibrillation     Paroxysmal - 2 episodes related to ischemic NSTEMI events  5 & 09/2011  . Gout   . S/P coronary artery stent placement, 10/01/11 with DES to mid LAD and  PTCA of Diag 2 10/01/2009    Inferior STEMI 2011 - BMS to LCx; NSTEMI 5/13 with ISR/thrombosis of Cx BMS -->multiple DES to  LCx ; staged PCI of LAD /Balloon PTCA of major Diag 09/2011 for Sx (NSTEMI)   . Anemia   . Schatzki's ring   . Angiodysplasia of intestine   . Aortic stenosis, moderate     2D Echocardiogram 12/13 and 08/22/11 showed moderate aortic stenosis with valve area of 1.28 sq cm. EF of 45 to 50% with severe hypokinesia of the entire inferolateral myocardium  . Ulcer   . Heart murmur   . MI (myocardial infarction) 03/16/2011    Inferolateral ST segment elevation MI  . MI (myocardial infarction)     "he's had 4" (10/30/2013)  . History of blood transfusion ~ 2013    "several; had bleeding in small intestines"  . Lower GI  bleeding ~ 2013  . Arthritis     "legs" (10/30/2013)  . ESRD on hemodialysis     [facility], T[facility], esrd due to HTN (10/30/2013)  . Right renal artery stenosis     renal stent in 1998  . Pneumonia 07/2013   Past Surgical History  Procedure Laterality Date  . Ureteral stent placement    . Colonoscopy  04/2009    with EGD by Dr.Buccini moderately large hiatal hernia could contribute to his anemia,Schatzki ring which might accout for his intermittent dysphagia/+internal hemorrhoids ;family hx of colon ca  . Esophagogastroduodenoscopy  03/2010    by Dr.Magod/small hiatal hernia with widley patent fibrous ring,minimal bulbitis otherwise nl  . Dialysis fistula creation    . Givens capsule study  04/16/10    SB erosions vs. AVMs, non-specific distal SB inflammation in setting ASA, plavix, NSAIDs  . Esophagogastroduodenoscopy  03/14/2012    Procedure: ESOPHAGOGASTRODUODENOSCOPY (EGD);  Surgeon: Gatha Mayer, MD;  Location: [facility] ENDOSCOPY;  Service: Endoscopy;  Laterality: N/A;  . Colonoscopy  03/16/2012    Procedure: COLONOSCOPY;  Surgeon: Gatha Mayer, MD;  Location: [place];  Service: Endoscopy;  Laterality: N/A;  . Renal artery stent Right 08/22/1996    Dr Quay Burow, P-154 Palmaz stent mounted on a 52m x 2cm PowerFlex and deployed in  the renal ostium at 7 atmospheres  reduction of 95% lesion to 0% residual without dissection    . Enteroscopy N/A 07/06/2012    Procedure: ENTEROSCOPY;  Surgeon: Irene Shipper, MD;  Location: [place];  Service: Endoscopy;  Laterality: N/A;  . Doppler echocardiography  03/15/2012-[facility]    LV 92-42%, grade 2 diastolic dysfunction; mod AV stenosis; calcified MV annulus with mild MR; PA pressure 23mHg  . Nm myocar perf wall motion  05/02/2010    EF 49% ; mod perfusion basal inferolateral, basal anterolateral,mid inferolat and mid anterolateral region  . Carotid doppler  04/20/2011    lright common iliac artery0-49%,left common iliac artery 0-49%  .  Renal angiogram  01/02/2004    C02 angiography-widely patent renal arteries  . Event monitor  09/02/2011-6/20/2/13    reason afib  . Coronary angioplasty with stent placement  08/2011 & 09/2011    Integrity Resolute DES Stents  Stent #1:  2.244mx 14 mm (extending to just distal to the bifurcation to the mid OM, Stent #2:  2.7540m 66m69mtent #3:  3.0mm 39m8mm;23mering proximal portion of the old stent into the proximal circumflex and overlapping stent #2 distally, Stent #4: 2.25mm x25m (4 22mrlapping in LCx- OM1; 1 in mid LAD; PTCA only of Major Diag (small caliber)  . Coronary angioplasty with stent placement  2011, 2012    Dr JonathanQuay Burow2  re-cannulized totally occluded circumflex, stented with Integrity bare metal stent focal 90% stenosis in LAD and diagonal branch   . Cardiac catheterization  10/30/2013  . Esophagogastroduodenoscopy (egd) with esophageal dilation    . Left heart catheterization with coronary angiogram N/A 08/24/2011    Procedure: LEFT HEART CATHETERIZATION WITH CORONARY ANGIOGRAM;  Surgeon: David W Leonie Manocation[facility];  Service: Cardiovascular;  Laterality: N/A;  . Left heart catheterization with coronary angiogram N/A 10/01/2011    Procedure: LEFT HEART CATHETERIZATION WITH CORONARY ANGIOGRAM;  Surgeon: David W Leonie Manocation[facility];  Service: Cardiovascular;  Laterality: N/A;  . Left and right heart catheterization with coronary angiogram N/A 10/30/2013    Procedure: LEFT AND RIGHT HEART CATHETERIZATION WITH CORONARY ANGIOGRAM;  Surgeon: JonathanLorretta Harpocation: [facility];  Service: Cardiovascular;  Laterality: N/A;  . Abdominal aortagram  10/30/2013    Procedure: ABDOMINAL AORTAGRAM;  Surgeon: JonathanLorretta Harpocation[facility];  Service: Cardiovascular;;   Social History:  reports that he quit smoking about 22 years ago. His smoking use included Cigarettes. He has a 22.5 pack-year smoking history. His smokeless  tobacco use includes Chew. He reports that he does not drink alcohol or use illicit drugs. Allergies:  Allergies  Allergen Reactions  . Diltiazem     Not able to tolerate due to SOB   Family History  Problem Relation Age of Onset  . Coronary artery disease Father   . Hypertension Mother   . Coronary artery disease Mother   . Colon cancer Brother 57    Me45cations:  Continuous: . sodium chloride    . piperacillin-tazobactam    . [START ON 06/02/2014] vancomycin      ROS: genarl malaise and fatigue as above Blood pressure 98/74, pulse 110, temperature 99 F (37.2 C), temperature source Rectal, resp. rate 24, SpO2 100 %.  General appearance: cooperative Head: Normocephalic, without obvious abnormality, atraumatic Eyes: conjunctivae/corneas clear. PERRL, EOM's intact. Fundi benign. Ears: normal TM's and external ear canals  both ears Nose: Nares normal. Septum midline. Mucosa normal. No drainage or sinus tenderness. Throat: lips, mucosa, and tongue normal; teeth and gums normal Resp: clear to auscultation bilaterally Chest wall: no tenderness Cardio: regular rate and rhythm, S1, S2 normal, no murmur, click, rub or gallop GI: soft, non-tender; bowel sounds normal; no masses,  no organomegaly Extremities: erythem bilat feet with punctate hemorrhages and abraised tissure ritht medial foot that is tender and punctate dime sized ulcerative areas some dried, abraised/ulcerated tender area right posterior calf arms with ucerated dry areas Skin: as above Neurologic: Grossly normal, gen weakness Results for orders placed or performed during the hospital encounter of 05/31/14 (from the past 48 hour(s))  Comprehensive metabolic panel     Status: Abnormal   Collection Time: 05/31/14  3:26 PM  Result Value Ref Range   Sodium 141 135 - 145 mmol/L   Potassium 4.2 3.5 - 5.1 mmol/L   Chloride 99 96 - 112 mmol/L   CO2 27 19 - 32 mmol/L   Glucose, Bld 93 70 - 99 mg/dL   BUN 17 6 - 23 mg/dL    Creatinine, Ser 4.05 (H) 0.50 - 1.35 mg/dL   Calcium 9.3 8.4 - 10.5 mg/dL   Total Protein 7.4 6.0 - 8.3 g/dL   Albumin 2.5 (L) 3.5 - 5.2 g/dL   AST 61 (H) 0 - 37 U/L   ALT 47 0 - 53 U/L   Alkaline Phosphatase 256 (H) 39 - 117 U/L   Total Bilirubin 1.7 (H) 0.3 - 1.2 mg/dL   GFR calc non Af Amer 13 (L) >90 mL/min   GFR calc Af Amer 16 (L) >90 mL/min    Comment: (NOTE) The eGFR has been calculated using the CKD EPI equation. This calculation has not been validated in all clinical situations. eGFR's persistently <90 mL/min signify possible Chronic Kidney Disease.    Anion gap 15 5 - 15  CBC with Differential     Status: Abnormal   Collection Time: 05/31/14  3:26 PM  Result Value Ref Range   WBC 5.3 4.0 - 10.5 K/uL   RBC 3.42 (L) 4.22 - 5.81 MIL/uL   Hemoglobin 12.9 (L) 13.0 - 17.0 g/dL   HCT 38.4 (L) 39.0 - 52.0 %   MCV 112.3 (H) 78.0 - 100.0 fL   MCH 37.7 (H) 26.0 - 34.0 pg   MCHC 33.6 30.0 - 36.0 g/dL   RDW 16.3 (H) 11.5 - 15.5 %   Platelets 196 150 - 400 K/uL   Neutrophils Relative % 63 43 - 77 %   Lymphocytes Relative 19 12 - 46 %   Monocytes Relative 10 3 - 12 %   Eosinophils Relative 7 (H) 0 - 5 %   Basophils Relative 1 0 - 1 %   Neutro Abs 3.3 1.7 - 7.7 K/uL   Lymphs Abs 1.0 0.7 - 4.0 K/uL   Monocytes Absolute 0.5 0.1 - 1.0 K/uL   Eosinophils Absolute 0.4 0.0 - 0.7 K/uL   Basophils Absolute 0.1 0.0 - 0.1 K/uL   RBC Morphology POLYCHROMASIA PRESENT    Dg Chest 2 View  05/31/2014   CLINICAL DATA:  Rash on legs, failure to thrive, weakness, htn, gerd, afib, gout, anemia, multiple MI, esrd  EXAM: CHEST  2 VIEW  COMPARISON:  05/27/2014  FINDINGS: Bilateral interstitial thickening is noted similar to the most recent prior study but increased when compared older exams. Right pleural effusion partly obscures the right hemidiaphragm. Minimal left effusion. No pneumothorax.  Cardiac silhouette is mildly enlarged. Aorta is mildly uncoiled. No mediastinal or hilar masses.  Bony  thorax is demineralized but grossly intact.  Bony thorax is demineralized but grossly intact.  IMPRESSION: 1. No significant change from the most recent prior study. 2. Small right minimal left pleural effusions. 3. Bilateral interstitial thickening. Mild interstitial edema should be considered in the proper clinical setting. No convincing pneumonia. 4. Mild stable cardiomegaly.   Electronically Signed   By: Lajean Manes M.D.   On: 05/31/2014 17:43    Assessment:  1 ESRD 2 Cellulitis with cutaneous lesion of uncertain cause ? Calciphylaxis 3 Weakness/Failure to thrive/hypoalbuminemia  Plan: 1 Agree with antibiotic tx 2 May need rehab stay/ PT 3 HD in [place] as tolerated(check [facility] meds)  Myrta Mercer C 05/31/2014, 7:23 PM

## 2014-05-31 NOTE — H&P (Signed)
[facility] History and Physical  Roger JarvisLarry L Emigh ZOX:[id number] DOB: 1940-06-18 DOA: 05/31/2014  Referring physician: Gray BernhardtElliot Wentz, MD PCP: Alice ReichertMCINNIS,ANGUS G, MD   Chief Complaint: Leg Swelling  HPI: Roger [name] is a 74 y.o. male presents with bilateral leg swelling and infection. History is given by his wife. For the last 2 weeks he has had increasing swelling of his legs. He has been weak and has not been able to standd on his feet. He has not noted any fevers. Patient has no chills. He has been more sleepy today. 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 His wife states that he was dialyzed today. In addition he has had multiple areas on his legs that start off as a blister and then they burst. His wife states that they then turn into sores. He has seen [place] dermatologist in [place] but states that the cream that was given to him has not really helped much. He is not a diabetic.   Review of Systems:  Patient is not able to provide a ROS as he was somewhat somnolent.  Past Medical History  Diagnosis Date  . HTN (hypertension)   . Hypercholesteremia   . GERD (gastroesophageal reflux disease)   . Atrial fibrillation     Paroxysmal - 2 episodes related to ischemic NSTEMI events  5 & 09/2011  . Gout   . S/P coronary artery stent placement, 10/01/11 with DES to mid LAD and  PTCA of Diag 2 10/01/2009    Inferior STEMI 2011 - BMS to LCx; NSTEMI 5/13 with ISR/thrombosis of Cx BMS -->multiple DES to  LCx ; staged PCI of LAD /Balloon PTCA of major Diag 09/2011 for Sx (NSTEMI)   . Anemia   . Schatzki's ring   . Angiodysplasia of intestine   . Aortic stenosis, moderate     2D Echocardiogram 12/13 and 08/22/11 showed moderate aortic stenosis with valve area of 1.28 sq cm. EF of 45 to 50% with severe hypokinesia of the entire inferolateral myocardium  . Ulcer   . Heart murmur   . MI (myocardial infarction) 03/16/2011    Inferolateral ST segment elevation MI  . MI (myocardial infarction)     "he's  had 4" (10/30/2013)  . History of blood transfusion ~ 2013    "several; had bleeding in small intestines"  . Lower GI bleeding ~ 2013  . Arthritis     "legs" (10/30/2013)  . ESRD on hemodialysis     [place] HD, TTS, esrd due to HTN (10/30/2013)  . Right renal artery stenosis     renal stent in 1998  . Pneumonia 07/2013   Past Surgical History  Procedure Laterality Date  . Ureteral stent placement    . Colonoscopy  04/2009    with EGD by Dr.Buccini moderately large hiatal hernia could contribute to his anemia,Schatzki ring which might accout for his intermittent dysphagia/+internal hemorrhoids ;family hx of colon ca  . Esophagogastroduodenoscopy  03/2010    by Dr.Magod/small hiatal hernia with widley patent fibrous ring,minimal bulbitis otherwise nl  . Dialysis fistula creation    . Givens capsule study  04/16/10    SB erosions vs. AVMs, non-specific distal SB inflammation in setting ASA, plavix, NSAIDs  . Esophagogastroduodenoscopy  03/14/2012    Procedure: ESOPHAGOGASTRODUODENOSCOPY (EGD);  Surgeon: Iva Booparl E Gessner, MD;  [facility];  Service: Endoscopy;  Laterality: N/A;  . Colonoscopy  03/16/2012    Procedure: COLONOSCOPY;  Surgeon: Iva Booparl E Gessner, MD;  [facility];  Service: Endoscopy;  Laterality: N/A;  . Renal artery stent Right 08/22/1996    Dr [name], P-154 Palmaz stent mounted on a 6mm x 2cm PowerFlex and deployed in the renal ostium at 7 atmospheres  reduction of 95% lesion to 0% residual without dissection    . Enteroscopy N/A 07/06/2012    Procedure: ENTEROSCOPY;  Surgeon: Hilarie Fredrickson, MD;  Location: [facility] ENDOSCOPY;  Service: Endoscopy;  Laterality: N/A;  . Doppler echocardiography  03/15/2012-[facility]    LV 45-50%, grade 2 diastolic dysfunction; mod AV stenosis; calcified MV annulus with mild MR; PA pressure  . Nm myocar perf wall motion  05/02/2010    EF 49% ; mod perfusion basal inferolateral, basal anterolateral,mid inferolat and mid  anterolateral region  . Carotid doppler  04/20/2011    lright common iliac artery0-49%,left common iliac artery 0-49%  . Renal angiogram  01/02/2004    C02 angiography-widely patent renal arteries  . Event monitor  09/02/2011-6/20/2/13    reason afib  . Coronary angioplasty with stent placement  08/2011 & 09/2011    Integrity Resolute DES Stents  Stent #1:  2.72mm x 14 mm (extending to just distal to the bifurcation to the mid OM, Stent #2:  2.17mm x 18mm  Stent #3:  3.50mm x 18mm; covering proximal portion of the old stent into the proximal circumflex and overlapping stent #2 distally, Stent #4: 2.63mm x 8mm (4 overlapping in LCx- OM1; 1 in mid LAD; PTCA only of Major Diag (small caliber)  . Coronary angioplasty with stent placement  2011, 2012    Dr [name]  03/16/11  re-cannulized totally occluded circumflex, stented with Integrity bare metal stent focal 90% stenosis in LAD and diagonal branch   . Cardiac catheterization  10/30/2013  . Esophagogastroduodenoscopy (egd) with esophageal dilation    . Left heart catheterization with coronary angiogram N/A 08/24/2011    Procedure: LEFT HEART CATHETERIZATION WITH CORONARY ANGIOGRAM;  Surgeon: Marykay Lex, MD;  Location: [facility] CATH LAB;  Service: Cardiovascular;  Laterality: N/A;  . Left heart catheterization with coronary angiogram N/A 10/01/2011    Procedure: LEFT HEART CATHETERIZATION WITH CORONARY ANGIOGRAM;  Surgeon: Marykay Lex, MD;  Location: [facility] CATH LAB;  Service: Cardiovascular;  Laterality: N/A;  . Left and right heart catheterization with coronary angiogram N/A 10/30/2013    Procedure: LEFT AND RIGHT HEART CATHETERIZATION WITH CORONARY ANGIOGRAM;  Surgeon: Runell Gess, MD;  Location: [facility] CATH LAB;  Service: Cardiovascular;  Laterality: N/A;  . Abdominal aortagram  10/30/2013    Procedure: ABDOMINAL AORTAGRAM;  Surgeon: Runell Gess, MD;  Location: [facility] CATH LAB;  Service: Cardiovascular;;   Social History:  reports that he quit  smoking about 22 years ago. His smoking use included Cigarettes. He has a 22.5 pack-year smoking history. His smokeless tobacco use includes Chew. He reports that he does not drink alcohol or use illicit drugs.  Allergies  Allergen Reactions  . Diltiazem     Not able to tolerate due to SOB    Family History  Problem Relation Age of Onset  . Coronary artery disease Father   . Hypertension Mother   . Coronary artery disease Mother   . Colon cancer Brother 60     Prior to Admission medications   Medication Sig Start Date End Date Taking? Authorizing Provider  acetaminophen (TYLENOL) 325 MG tablet Take 2 tablets (650 mg total) by mouth every 4 (four) hours as needed for headache or mild pain. 10/31/13  Yes  Luke K Kilroy, PA-C  acetaminophen-codeine (TYLENOL #3) 300-30 MG per tablet Take 1 tablet by mouth every 4 (four) hours as needed for moderate pain.   Yes Historical Provider, MD  allopurinol (ZYLOPRIM) 100 MG tablet Take 100 mg by mouth daily.    Yes Historical Provider, MD  amiodarone (PACERONE) 200 MG tablet Take 0.5 tablets (100 mg total) by mouth at bedtime. 01/15/14  Yes Runell Gess, MD  cinacalcet (SENSIPAR) 30 MG tablet Take 30 mg by mouth daily. Tues Thursdays and saturdays   Yes Historical Provider, MD  clobetasol cream (TEMOVATE) 0.05 % Apply 1 application topically daily. For sores on arm 02/28/14  Yes Historical Provider, MD  doxycycline (VIBRA-TABS) 100 MG tablet Take 100 mg by mouth 2 (two) times daily. Started 05-28-14 for 5 days   Yes Historical Provider, MD  metoprolol tartrate (LOPRESSOR) 25 MG tablet Take 12.5 mg by mouth 2 (two) times daily.   Yes Historical Provider, MD  multivitamin (RENA-VIT) TABS tablet Take 1 tablet by mouth every morning.    Yes Historical Provider, MD  nitroGLYCERIN (NITROSTAT) 0.4 MG SL tablet Place 0.4 mg under the tongue every 5 (five) minutes as needed for chest pain.   Yes Historical Provider, MD  PARoxetine (PAXIL) 20 MG tablet Take 20  mg by mouth daily.   Yes Historical Provider, MD  prasugrel (EFFIENT) 10 MG TABS tablet Take 1 tablet (10 mg total) by mouth daily. 04/18/14  Yes Runell Gess, MD  ranitidine (ZANTAC) 150 MG tablet Take 150 mg by mouth daily.   Yes Historical Provider, MD  sevelamer (RENVELA) 800 MG tablet Take 2,400 mg by mouth 3 (three) times daily with meals.   Yes Historical Provider, MD  temazepam (RESTORIL) 15 MG capsule Take 15-30 mg by mouth at bedtime as needed for sleep.   Yes Historical Provider, MD  pantoprazole (PROTONIX) 40 MG tablet Take 1 tablet (40 mg total) by mouth daily. Patient not taking: Reported on 05/27/2014 05/09/14   Runell Gess, MD   Physical Exam: Filed Vitals:   05/31/14 1600 05/31/14 1700 05/31/14 1730 05/31/14 1800  BP: 107/77 108/69 100/64 102/68  Pulse: 123   124  Temp:      TempSrc:      Resp: SpO2: 100%   97%    Wt Readings from Last 3 Encounters:  05/27/14 81.194 kg (179 lb)  04/18/14 78.563 kg (173 lb 3.2 oz)  04/03/14 78.608 kg (173 lb 4.8 oz)    General:  Appears calm and comfortable Eyes: PERRL, normal lids, irises & conjunctiva ENT: grossly normal hearing as he opens his eyes to verbal stimuli Neck: no LAD, masses or thyromegaly Cardiovascular: IRR, no m/r/g. ++LE edema. Respiratory: CTA bilaterally, no w/r/r. Normal respiratory effort. Abdomen: soft, ntnd Skin: ++dorsum of foot and has scabbed over area on both lower extremities as well as some areas on upper extremities Musculoskeletal: grossly normal tone BUE/BLE Psychiatric: Unable to assess Neurologic: Unable to assess          Labs on Admission:  Basic Metabolic Panel:  Recent Labs Lab 05/27/14 1051 05/31/14 1526  NA 137 141  K 5.1 4.2  CL 92* 99  CO2 28 27  GLUCOSE 105* 93  BUN 23 17  CREATININE 4.68* 4.05*  CALCIUM 10.6* 9.3   Liver Function Tests:  Recent Labs Lab 05/27/14 1051 05/31/14 1526  AST 24 61*  ALT 25 47  ALKPHOS 358* 256*  BILITOT 2.3* 1.7*  PROT 7.8 7.4  ALBUMIN 2.8* 2.5*   No results for input(s): LIPASE, AMYLASE in the last 168 hours. No results for input(s): AMMONIA in the last 168 hours. CBC:  Recent Labs Lab 05/27/14 1051 05/31/14 1526  WBC 6.7 5.3  NEUTROABS  --  3.3  HGB 12.7* 12.9*  HCT 38.3* 38.4*  MCV 111.7* 112.3*  PLT 193 196   Cardiac Enzymes: No results for input(s): CKTOTAL, CKMB, CKMBINDEX, TROPONINI in the last 168 hours.  BNP (last 3 results) No results for input(s): BNP in the last 8760 hours.  ProBNP (last 3 results) No results for input(s): PROBNP in the last 8760 hours.  CBG: No results for input(s): GLUCAP in the last 168 hours.  Radiological Exams on Admission: Dg Chest 2 View  05/31/2014   CLINICAL DATA:  Rash on legs, failure to thrive, weakness, htn, gerd, afib, gout, anemia, multiple MI, esrd  EXAM: CHEST  2 VIEW  COMPARISON:  05/27/2014  FINDINGS: Bilateral interstitial thickening is noted similar to the most recent prior study but increased when compared older exams. Right pleural effusion partly obscures the right hemidiaphragm. Minimal left effusion. No pneumothorax.  Cardiac silhouette is mildly enlarged. Aorta is mildly uncoiled. No mediastinal or hilar masses.  Bony thorax is demineralized but grossly intact.  Bony thorax is demineralized but grossly intact.  IMPRESSION: 1. No significant change from the most recent prior study. 2. Small right minimal left pleural effusions. 3. Bilateral interstitial thickening. Mild interstitial edema should be considered in the proper clinical setting. No convincing pneumonia. 4. Mild stable cardiomegaly.   Electronically Signed   By: Amie Portland M.D.   On: 05/31/2014 17:43     Assessment/Plan Active Problems:   GERD (gastroesophageal reflux disease)   Anemia of chronic renal failure   Bilateral lower leg cellulitis   Tachycardia   Gout   Cellulitis, leg   CKD (chronic kidney disease) stage V requiring chronic  dialysis   1. Bilateral LE cellulitis -patient will be admitted for further assessment -blood cultures drawn -start on vancocin and zosyn per pharmacy  2. Chronic Atrial Fibrillation -currently rate controlled -will continue to monitor  3. CKD V -he was dialysed today -will need nephrology consultation  4. Gout -continue with home medications  5. Chronic Anemia -currently his Hgb is at baseline    Code Status: Full Code (must indicate code status--if unknown or must be presumed, indicate so) DVT Prophylaxis:Heparin Family Communication: Wife (indicate person spoken with, if applicable, with phone number if by telephone) Disposition Plan: Home (indicate anticipated LOS)  Time spent:  [facility] Pager [phone]

## 2014-05-31 NOTE — Progress Notes (Addendum)
ANTIBIOTIC CONSULT NOTE - INITIAL  Pharmacy Consult for Vancomycin Indication: Cellulitis  Allergies  Allergen Reactions  . Diltiazem     Not able to tolerate due to SOB    Patient Measurements: TBW - 81.2 kg  Vital Signs: Temp: 99 F (37.2 C) (02/18 1538) Temp Source: Rectal (02/18 1538) BP: 100/75 mmHg (02/18 1530) Pulse Rate: 129 (02/18 1424) Intake/Output from previous day:   Intake/Output from this shift:    Labs:  Recent Labs  05/31/14 1526  WBC 5.3  HGB 12.9*  PLT 196   Estimated Creatinine Clearance: 14.3 mL/min (by C-G formula based on Cr of 4.68). No results for input(s): VANCOTROUGH, VANCOPEAK, VANCORANDOM, GENTTROUGH, GENTPEAK, GENTRANDOM, TOBRATROUGH, TOBRAPEAK, TOBRARND, AMIKACINPEAK, AMIKACINTROU, AMIKACIN in the last 72 hours.   Microbiology: No results found for this or any previous visit (from the past 720 hour(s)).  Medical History: Past Medical History  Diagnosis Date  . HTN (hypertension)   . Hypercholesteremia   . GERD (gastroesophageal reflux disease)   . Atrial fibrillation     Paroxysmal - 2 episodes related to ischemic NSTEMI events  5 & 09/2011  . Gout   . S/P coronary artery stent placement, 10/01/11 with DES to mid LAD and  PTCA of Diag 2 10/01/2009    Inferior STEMI 2011 - BMS to LCx; NSTEMI 5/13 with ISR/thrombosis of Cx BMS -->multiple DES to  LCx ; staged PCI of LAD /Balloon PTCA of major Diag 09/2011 for Sx (NSTEMI)   . Anemia   . Schatzki's ring   . Angiodysplasia of intestine   . Aortic stenosis, moderate     2D Echocardiogram 12/13 and 08/22/11 showed moderate aortic stenosis with valve area of 1.28 sq cm. EF of 45 to 50% with severe hypokinesia of the entire inferolateral myocardium  . Ulcer   . Heart murmur   . MI (myocardial infarction) 03/16/2011    Inferolateral ST segment elevation MI  . MI (myocardial infarction)     "he's had 4" (10/30/2013)  . History of blood transfusion ~ 2013    "several; had bleeding in small  intestines"  . Lower GI bleeding ~ 2013  . Arthritis     "legs" (10/30/2013)  . ESRD on hemodialysis     [place] [facility], TTS, esrd due to HTN (10/30/2013)  . Right renal artery stenosis     renal stent in 1998  . Pneumonia 07/2013    Medications:   (Not in a hospital admission)   Assessment: 74 yo M presents on 2/18 with rash / failure to thrive. PMH includes HTN, HLD, MI with stents, AFib, anemia, ESRD (HD TTS). Pharmacy to dose vancomycin for cellulitis. Both feet have redness and pain. Doxycycline was started 2 days ago for suspected early infection of the feet. Had a treatment of dialysis today. Pt is afebrile, WBC wnl.   Goal of Therapy:  Vancomycin trough level 10-15 mcg/ml  Resolution of infection  Plan:  Vancomycin 1750mg  IV x 1, then start 1g IV QHD Monitor clinical picture F/U HD schedule  Armandina StammerBATCHELDER,Keiaira Donlan J 05/31/2014,4:05 PM  Addendum: Pharmacy to add Zosyn for cellulitis.   Plan: Start Zosyn 2.25g IV Q8 tomorrow at 0400

## 2014-05-31 NOTE — ED Notes (Signed)
Assisted Jody, RN with taking pt's pants off

## 2014-05-31 NOTE — ED Notes (Signed)
Per EMS, patient sent here by his nephrologist, Dr Lowell GuitarPowell, for testing regarding sores on extremities. Pt. Reports being seen by dermatologist and tried creams without success. Dialysis patient, last dialysis this AM, full treatment done. Pt. Denies other complaints at this time, denies CP, SOB.

## 2014-05-31 NOTE — ED Provider Notes (Signed)
  Face-to-face evaluation   History: Roger [name] is a 74 y.o. male who presents for evaluation of worsening swelling in both feet associated with redness and pain.  He is having increasing trouble walking secondary to the pain.  He has had decreased appetite for several weeks.  There's been no documented fever.  His physician and dialysis started doxycycline, 2 days ago for suspected early infection of the feet.  He was seen in the ED 4 days ago, and at that time was evaluated for leg swelling and possible rectal bleeding.  There's been no reported additional rectal bleeding.  He is apparently taking his usual medications.  He had his usual full dialysis treatment today.  He has a chronic skin rash, diffusely, that has been evaluated by dermatology and treated with a medicated ointment.  There is no known trauma or specific self-induced itching that might have caused a rash, that is known.  There are no other known modifying factors.   Physical exam: Alert, calm, cooperative, confused.  Heart regular rhythm, no murmur.  Lungs mild diffuse rhonchi.  Skin generalized lesions, arms, legs, trunk, with thick scabs over ulcerations.  Feet are reddened with apparent excoriations.  Lower legs swollen, primarily ankles to feet.  No evidence of lymphangitis.  Medical screening examination/treatment/procedure(s) were conducted as a shared visit with non-physician practitioner(s) and myself.  I personally evaluated the patient during the encounter  Roger MelterElliott L Zian Delair, MD 06/01/14 [phone]

## 2014-05-31 NOTE — ED Notes (Signed)
Pink armband placed on pt's left wrist

## 2014-06-01 ENCOUNTER — Inpatient Hospital Stay (HOSPITAL_COMMUNITY): Payer: Medicare Other

## 2014-06-01 DIAGNOSIS — I10 Essential (primary) hypertension: Secondary | ICD-10-CM

## 2014-06-01 DIAGNOSIS — R001 Bradycardia, unspecified: Secondary | ICD-10-CM

## 2014-06-01 DIAGNOSIS — I350 Nonrheumatic aortic (valve) stenosis: Secondary | ICD-10-CM

## 2014-06-01 DIAGNOSIS — I255 Ischemic cardiomyopathy: Secondary | ICD-10-CM

## 2014-06-01 DIAGNOSIS — I480 Paroxysmal atrial fibrillation: Secondary | ICD-10-CM

## 2014-06-01 DIAGNOSIS — Z955 Presence of coronary angioplasty implant and graft: Secondary | ICD-10-CM

## 2014-06-01 DIAGNOSIS — I4891 Unspecified atrial fibrillation: Secondary | ICD-10-CM

## 2014-06-01 DIAGNOSIS — Z992 Dependence on renal dialysis: Secondary | ICD-10-CM

## 2014-06-01 DIAGNOSIS — I739 Peripheral vascular disease, unspecified: Secondary | ICD-10-CM

## 2014-06-01 DIAGNOSIS — N186 End stage renal disease: Secondary | ICD-10-CM

## 2014-06-01 DIAGNOSIS — Q2733 Arteriovenous malformation of digestive system vessel: Secondary | ICD-10-CM

## 2014-06-01 DIAGNOSIS — I4510 Unspecified right bundle-branch block: Secondary | ICD-10-CM

## 2014-06-01 LAB — CBC
HEMATOCRIT: 34.9 % — AB (ref 39.0–52.0)
HEMOGLOBIN: 11.6 g/dL — AB (ref 13.0–17.0)
MCH: 37.1 pg — ABNORMAL HIGH (ref 26.0–34.0)
MCHC: 33.2 g/dL (ref 30.0–36.0)
MCV: 111.5 fL — ABNORMAL HIGH (ref 78.0–100.0)
Platelets: 186 10*3/uL (ref 150–400)
RBC: 3.13 MIL/uL — ABNORMAL LOW (ref 4.22–5.81)
RDW: 16.6 % — ABNORMAL HIGH (ref 11.5–15.5)
WBC: 4.9 10*3/uL (ref 4.0–10.5)

## 2014-06-01 LAB — GLUCOSE, CAPILLARY: Glucose-Capillary: 92 mg/dL (ref 70–99)

## 2014-06-01 LAB — COMPREHENSIVE METABOLIC PANEL
ALT: 40 U/L (ref 0–53)
AST: 42 U/L — ABNORMAL HIGH (ref 0–37)
Albumin: 2.2 g/dL — ABNORMAL LOW (ref 3.5–5.2)
Alkaline Phosphatase: 225 U/L — ABNORMAL HIGH (ref 39–117)
Anion gap: 12 (ref 5–15)
BILIRUBIN TOTAL: 2 mg/dL — AB (ref 0.3–1.2)
BUN: 22 mg/dL (ref 6–23)
CALCIUM: 9.9 mg/dL (ref 8.4–10.5)
CO2: 26 mmol/L (ref 19–32)
Chloride: 101 mmol/L (ref 96–112)
Creatinine, Ser: 4.67 mg/dL — ABNORMAL HIGH (ref 0.50–1.35)
GFR calc Af Amer: 13 mL/min — ABNORMAL LOW (ref >90)
GFR, EST NON AFRICAN AMERICAN: 11 mL/min — AB (ref >90)
GLUCOSE: 97 mg/dL (ref 70–99)
Potassium: 4.6 mmol/L (ref 3.5–5.1)
SODIUM: 139 mmol/L (ref 135–145)
Total Protein: 6.6 g/dL (ref 6.0–8.3)

## 2014-06-01 MED ORDER — ALBUMIN HUMAN 25 % IV SOLN
INTRAVENOUS | Status: AC
  Filled 2014-06-01: qty 50

## 2014-06-01 MED ORDER — DIGOXIN 0.25 MG/ML IJ SOLN
0.1250 mg | Freq: Once | INTRAMUSCULAR | Status: AC
Start: 2014-06-01 — End: 2014-06-01
  Administered 2014-06-01: 0.125 mg via INTRAVENOUS
  Filled 2014-06-01: qty 0.5

## 2014-06-01 MED ORDER — SODIUM CHLORIDE 0.9 % IV BOLUS (SEPSIS)
500.0000 mL | Freq: Once | INTRAVENOUS | Status: AC
  Administered 2014-06-01: 500 mL via INTRAVENOUS

## 2014-06-01 MED ORDER — ALBUMIN HUMAN 25 % IV SOLN
12.5000 g | Freq: Once | INTRAVENOUS | Status: AC
  Administered 2014-06-01: 12.5 g via INTRAVENOUS

## 2014-06-01 MED ORDER — ASPIRIN 81 MG PO CHEW
81.0000 mg | CHEWABLE_TABLET | Freq: Every day | ORAL | Status: DC
  Administered 2014-06-02 – 2014-06-04 (×3): 81 mg via ORAL
  Filled 2014-06-01 (×3): qty 1

## 2014-06-01 NOTE — Progress Notes (Signed)
Utilization review completed. Kimble Delaurentis, RN, BSN. 

## 2014-06-01 NOTE — Progress Notes (Signed)
Pt cont to have increased HR; 120's-130's. Craige CottaKirby paged. New order given to admin Digoxin IV push. RN followed orders.  Before giving Digoxin pt BP 88/48 manually. Craige CottaKirby paged. New order given to admin 500 cc NS Bolus. RN followed orders. Will cont to monitor.

## 2014-06-01 NOTE — Progress Notes (Signed)
[facility] Rounding Note Subjective:  These places "itch" Has pain in lower legs with swelling No CP or SOB  Objective Vital signs in last 24 hours: Filed Vitals:   06/01/14 0616 06/01/14 0654 06/01/14 0830 06/01/14 1027  BP: 88/67 94/64 101/57 97/63  Pulse:   122 118  Temp:      TempSrc:      Resp:      Height:      Weight:      SpO2:    100%   Weight change:  No intake or output data in the 24 hours ending 06/01/14 1100    Physical Exam:  BP 97/63 mmHg  Pulse 118  Temp(Src) 98.7 F (37.1 C) (Oral)  Resp 18  Ht 5' 6"  (1.676 m)  Wt 78.5 kg (173 lb 1 oz)  BMI 27.95 kg/m2  SpO2 100% Very nice soft spoken older man  - daughter and son-in-law in room with him [place]/AT Lungs clear S1S2 No S3 Left AVF + bruit Exts: On arms - numerous lesions some punctate in appearance expecially right upper arm with scabbing, others more serpiginous on dorsum of hands LE's with 1+ edema. Ruburous changes feet with abraded, scaly, red cellulitic areas tops of feet and lower legs; deep serpiginous lesions behind right knee, over right knee, in crease; no nodularity to the lesions (trunk is spared)   Labs: Basic Metabolic Panel:  Recent Labs Lab 05/27/14 1051 05/31/14 1526 06/01/14 0525  NA 137 141 139  K 5.1 4.2 4.6  CL 92* 99 101  CO2 28 27 26   GLUCOSE 105* 93 97  BUN 23 17 22   CREATININE 4.68* 4.05* 4.67*  CALCIUM 10.6* 9.3 9.9   Liver Function Tests:  Recent Labs Lab 05/27/14 1051 05/31/14 1526 06/01/14 0525  AST 24 61* 42*  ALT 25 47 40  ALKPHOS 358* 256* 225*  BILITOT 2.3* 1.7* 2.0*  PROT 7.8 7.4 6.6  ALBUMIN 2.8* 2.5* 2.2*    Recent Labs Lab 05/27/14 1051 05/31/14 1526 06/01/14 0525  WBC 6.7 5.3 4.9  NEUTROABS  --  3.3  --   HGB 12.7* 12.9* 11.6*  HCT 38.3* 38.4* 34.9*  MCV 111.7* 112.3* 111.5*  PLT 193 196 186    Recent Labs Lab 06/01/14 0652  GLUCAP 92   Studies/Results: Dg Chest 2 View  05/31/2014   CLINICAL DATA:  Rash on  legs, failure to thrive, weakness, htn, gerd, afib, gout, anemia, multiple MI, esrd  EXAM: CHEST  2 VIEW  COMPARISON:  05/27/2014  FINDINGS: Bilateral interstitial thickening is noted similar to the most recent prior study but increased when compared older exams. Right pleural effusion partly obscures the right hemidiaphragm. Minimal left effusion. No pneumothorax.  Cardiac silhouette is mildly enlarged. Aorta is mildly uncoiled. No mediastinal or hilar masses.  Bony thorax is demineralized but grossly intact.  Bony thorax is demineralized but grossly intact.  IMPRESSION: 1. No significant change from the most recent prior study. 2. Small right minimal left pleural effusions. 3. Bilateral interstitial thickening. Mild interstitial edema should be considered in the proper clinical setting. No convincing pneumonia. 4. Mild stable cardiomegaly.   Electronically Signed   By: Lajean Manes M.D.   On: 05/31/2014 17:43   Medications:   . allopurinol  100 mg Oral Daily  . amiodarone  100 mg Oral QHS  . aspirin EC  325 mg Oral Daily  . cinacalcet  30 mg Oral Q T,Th,Sat-1800  . clobetasol cream  1 application Topical  Daily  . famotidine  20 mg Oral Daily  . folic acid  1 mg Oral Daily  . heparin  5,000 Units Subcutaneous 3 times per day  . metoprolol tartrate  12.5 mg Oral BID  . multivitamin with minerals  1 tablet Oral Daily  . PARoxetine  20 mg Oral Daily  . piperacillin-tazobactam (ZOSYN)  IV  2.25 g Intravenous Q8H  . prasugrel  10 mg Oral Daily  . senna  1 tablet Oral BID  . sevelamer carbonate  2,400 mg Oral TID WC  . sodium chloride  3 mL Intravenous Q12H  . sodium chloride  3 mL Intravenous Q12H  . thiamine  100 mg Oral Daily   Dialysis Prescription [facility] TTS (last HD PTA 2/18) 3 hours 15 minutes  400/800 2K2Ca AVF Venofer 50/week Doxercalciferol 1 mcg No heparin Last iPTH 502  Last Hb 12.4 Last phos 5.8 120   Assessment/Recommendations 1. ESRD - continueTTS HD 2. Cellulitis LE's -  Zosyn 3. Multiple cutaneous lesions - the presentation (starting as blisters), distribution (upper and lower arms, hands - not just LE's) not typical for calciphylaxis as I have seen it in the past, although certainly in the DDx and he has the substrate for it (elev PTH and high phos historically) .  Family says dermatology has not seen this full blow set of lesions (he has them in various stages of healing) - I think a deep tissue biopsy would be needed in this case to determine etiology. Unfortunately no dermatology consultation available in the hospital. Local care for now, at some point biopsy. Continue sensipar, control phos. 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 Cannot tell if dose was increased with 1/20 value of 502. Check PTH and increase dose if appropriate. Check phosphorus 5. Abnormal LFT's - elevated alk phos, bili and mild transaminase elevations. Not clear that any evaluation has been done and these values were normal 11/2013. Start with RUQ [place] 6. HTN - meds and volume control 7. CAD with prior PTCA and stenting asymptomatic at this time 8. Moderate AS by ECHO 2013 9. AFib per primary team amio/BB  Jamal Maes, MD [facility] [phone] pager 06/01/2014, 11:00 AM

## 2014-06-01 NOTE — Progress Notes (Signed)
Patient Demographics  Roger [name], is a 74 y.o. male, DOB - 11/28/1940, ZOX:[id number]  Admit date - 05/31/2014   Admitting Physician Yevonne Pax, MD  Outpatient Primary MD for the patient is [name], MD  LOS - 1   Chief Complaint  Patient presents with  . Rash  . Failure To Thrive      Admission history of present illness:  : Roger [name] is a 74 y.o. male presents with bilateral leg swelling and infection. History is given by his wife. For the last 2 weeks he has had increasing swelling of his legs. He has been weak and has not been able to standd on his feet. He has not noted any fevers. Patient has no chills. He has been more sleepy today. 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 His wife states that he was dialyzed today. In addition he has had multiple areas on his legs that start off as a blister and then they burst. His wife states that they then turn into sores. He has seen a dermatologist in [place] but states that the cream that was given to him has not really helped much. He is not a diabetic Subjective:   Raynell Scott today has, No headache, No chest pain, No abdominal pain - No Nausea, No new weakness tingling or numbness, No Cough - SOB.   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 Bilateral LE cellulitis - Calciphylaxis ?? -Continue vancocin and zosyn per pharmacy - Follow on blood cultures  2. Chronic Atrial Fibrillation -Cardiology consult appreciated, any with amiodarone for heart rate control - Not a candidate for anticoagulation giving history of significant GI bleed .  3. CKD V -Nephrology consulted for hemodialysis  4. Gout -continue with home medications  5. Chronic Anemia -currently his Hgb is at baseline   Code Status: Full  Family Communication: Wife at bedside  Disposition Plan: Remains inpatient   Procedures  None   Consults  Nephrology Cardiology  Medications  Scheduled Meds: . albumin human      . allopurinol  100 mg Oral Daily  . amiodarone  100 mg Oral QHS  . [START ON 06/02/2014] aspirin  81 mg Oral Daily  . cinacalcet  30 mg Oral Q T,Th,Sat-1800  . clobetasol cream  1 application Topical Daily  . famotidine  20 mg Oral Daily  . folic acid  1 mg Oral Daily  . heparin  5,000 Units Subcutaneous 3 times per day  . metoprolol tartrate  12.5 mg Oral BID  .  multivitamin with minerals  1 tablet Oral Daily  . PARoxetine  20 mg Oral Daily  . piperacillin-tazobactam (ZOSYN)  IV  2.25 g Intravenous Q8H  . prasugrel  10 mg Oral Daily  . senna  1 tablet Oral BID  . sevelamer carbonate  2,400 mg Oral TID WC  . sodium chloride  3 mL Intravenous Q12H  . sodium chloride  3 mL Intravenous Q12H  . thiamine  100 mg Oral Daily   Continuous Infusions:  PRN Meds:.sodium chloride, sodium chloride, sodium chloride, acetaminophen **OR** acetaminophen, feeding supplement (NEPRO CARB STEADY), heparin, heparin, lidocaine (PF), lidocaine-prilocaine, morphine injection, nitroGLYCERIN, ondansetron **OR** ondansetron (ZOFRAN) IV, oxyCODONE, pentafluoroprop-tetrafluoroeth, sodium chloride, temazepam  DVT Prophylaxis  heparin  Lab Results  Component Value Date   PLT 186 06/01/2014    Antibiotics     Anti-infectives    Start     Dose/Rate Route Frequency Ordered Stop   06/02/14 1200  vancomycin (VANCOCIN) IVPB 1000 mg/200 mL premix  Status:  Discontinued     1,000 mg 200 mL/hr over 60 Minutes Intravenous Every T-Th-Sa (Hemodialysis) 05/31/14 1612 05/31/14 2029   06/01/14 0400  piperacillin-tazobactam (ZOSYN) IVPB 2.25 g     2.25 g 100 mL/hr over 30 Minutes Intravenous Every 8 hours 05/31/14 1937     05/31/14 1945  piperacillin-tazobactam (ZOSYN) IVPB 3.375 g     3.375 g 100 mL/hr over 30 Minutes Intravenous  Once 05/31/14 1934 05/31/14 2010   05/31/14 1600  vancomycin (VANCOCIN) 1,750 mg in sodium chloride 0.9 % 500 mL IVPB     1,750 mg 250 mL/hr over 120 Minutes Intravenous  Once 05/31/14 1553 05/31/14 1808          Objective:   Filed Vitals:   06/01/14 1448 06/01/14 1500 06/01/14 1530 06/01/14 1600  BP: 88/56 88/45 90/34  105/54  Pulse: 115 116 117 101  Temp: 98.2 F (36.8 C)     TempSrc: Oral     Resp: 17 20 18 20   Height:      Weight:      SpO2:        Wt Readings from Last 3 Encounters:  05/31/14 78.5 kg (173 lb 1 oz)  05/27/14 81.194 kg (179 lb)  04/18/14 78.563 kg (173 lb 3.2 oz)     Intake/Output Summary (Last 24 hours) at 06/01/14 1714 Last data filed at 06/01/14 1300  Gross per 24 hour  Intake     60 ml  Output      0 ml  Net     60 ml     Physical Exam  Awake Alert, Oriented X 3, No new F.N deficits, Normal affect [place].AT,PERRAL Supple Neck,No JVD, No cervical lymphadenopathy appriciated.  Symmetrical Chest wall movement, Good air movement bilaterally, CTAB ,No Gallops,Rubs or new Murmurs, No Parasternal Heave +ve B.Sounds, Abd Soft, No tenderness, No organomegaly appriciated, No rebound - guarding or rigidity. Bilateral lower extremity erythema, with areas of dried hemorrhage and ulceration.   Data Review   Micro Results No results found for this or any previous visit (from the past 240 hour(s)).  Radiology Reports Dg Chest 2  View  05/31/2014   CLINICAL DATA:  Rash on legs, failure to thrive, weakness, htn, gerd, afib, gout, anemia, multiple MI, esrd  EXAM: CHEST  2 VIEW  COMPARISON:  05/27/2014  FINDINGS: Bilateral interstitial thickening is noted similar to the most recent prior study but increased when compared older exams. Right pleural effusion partly obscures the right hemidiaphragm. Minimal left effusion.  No pneumothorax.  Cardiac silhouette is mildly enlarged. Aorta is mildly uncoiled. No mediastinal or hilar masses.  Bony thorax is demineralized but grossly intact.  Bony thorax is demineralized but grossly intact.  IMPRESSION: 1. No significant change from the most recent prior study. 2. Small right minimal left pleural effusions. 3. Bilateral interstitial thickening. Mild interstitial edema should be considered in the proper clinical setting. No convincing pneumonia. 4. Mild stable cardiomegaly.   Electronically Signed   By: Amie Portland M.D.   On: 05/31/2014 17:43    CBC  Recent Labs Lab 05/27/14 1051 05/31/14 1526 06/01/14 0525  WBC 6.7 5.3 4.9  HGB 12.7* 12.9* 11.6*  HCT 38.3* 38.4* 34.9*  PLT 193 196 186  MCV 111.7* 112.3* 111.5*  MCH 37.0* 37.7* 37.1*  MCHC 33.2 33.6 33.2  RDW 16.0* 16.3* 16.6*  LYMPHSABS  --  1.0  --   MONOABS  --  0.5  --   EOSABS  --  0.4  --   BASOSABS  --  0.1  --     Chemistries   Recent Labs Lab 05/27/14 1051 05/31/14 1526 06/01/14 0525  NA 137 141 139  K 5.1 4.2 4.6  CL 92* 99 101  CO2 GLUCOSE 105* 93 97  BUN CREATININE 4.68* 4.05* 4.67*  CALCIUM 10.6* 9.3 9.9  AST 24 61* 42*  ALT 25 47 40  ALKPHOS 358* 256* 225*  BILITOT 2.3* 1.7* 2.0*   ------------------------------------------------------------------------------------------------------------------ estimated creatinine clearance is 14.1 mL/min (by C-G formula based on Cr of  4.67). ------------------------------------------------------------------------------------------------------------------ No results for input(s): HGBA1C in the last 72 hours. ------------------------------------------------------------------------------------------------------------------ No results for input(s): CHOL, HDL, LDLCALC, TRIG, CHOLHDL, LDLDIRECT in the last 72 hours. ------------------------------------------------------------------------------------------------------------------  Recent Labs  05/31/14 2145  TSH 5.326*   ------------------------------------------------------------------------------------------------------------------ No results for input(s): VITAMINB12, FOLATE, FERRITIN, TIBC, IRON, RETICCTPCT in the last 72 hours.  Coagulation profile  Recent Labs Lab 05/27/14 1051  INR 1.43    No results for input(s): DDIMER in the last 72 hours.  Cardiac Enzymes No results for input(s): CKMB, TROPONINI, MYOGLOBIN in the last 168 hours.  Invalid input(s): CK ------------------------------------------------------------------------------------------------------------------ Invalid input(s): POCBNP     Time Spent in minutes   30 minutes   Lolly Glaus M.D on 06/01/2014 at 5:14 PM  Between 7am to 7pm - Pager - [phone]  After 7pm go to [email or web address] - password [email or web address]  And look for the night coverage person covering for me after hours  [facility] Office  [phone]   **Disclaimer: This note may have been dictated with voice recognition software. Similar sounding words can inadvertently be transcribed and this note may contain transcription errors which may not have been corrected upon publication of note.**

## 2014-06-01 NOTE — Procedures (Signed)
I have personally attended this patient's dialysis session.   He was to have dialyzed tomorrow (Saturday) but the order date change was not noted and he was brought to HD today so I elected to let him finish a treatment today. He tolerated the treatment well. Will get him back on schedule before time for discharge.  Camille Balynthia Iyanah Demont, MD [facility] [phone] Pager 06/01/2014, 6:56 PM

## 2014-06-01 NOTE — Consult Note (Signed)
Reason for Consult:   AF with RVR  Requesting Physician: [facility]  HPI:          74 year old male with a history of ischemic heart disease, status post multiple interventions in the past, last PCI was 2013 with DES. He was cathed in July 2015 and had patent stents. He also has ESRD on hemodialysis, hypertension, and hyperlipidemia. He has had AFib in the past-  He was in NSR, on Amiodarone, when he last saw Dr Allyson SabalBerry in Jan 2016. He was on aspirin, Effient, and Coumadin and had a GI bleed March 2014. He was taken off his aspirin and his Coumadin. He is on Effient alone now. He has moderate aortic stenosis.  His last echo done in Jan 2016 suggested progression of his aortic stenosis to moderate- with a valve area of 0.6 cm, peak gradient 56 mmHg, mean 32 mmHg.            He is admitted now with non healing wounds on his legs. He is felt ot have cellulitis. He is noted to be in AF with VR around 100. He is re\lativkley asymptomatic when I examined him on dialysis (he is a poor historian). He denies chest pain, palpitations, or SOB.    PMHx:  Past Medical History  Diagnosis Date  . HTN (hypertension)   . Hypercholesteremia   . GERD (gastroesophageal reflux disease)   . Atrial fibrillation     Paroxysmal - 2 episodes related to ischemic NSTEMI events  5 & 09/2011  . Gout   . S/P coronary artery stent placement, 10/01/11 with DES to mid LAD and  PTCA of Diag 2 10/01/2009    Inferior STEMI 2011 - BMS to LCx; NSTEMI 5/13 with ISR/thrombosis of Cx BMS -->multiple DES to  LCx ; staged PCI of LAD /Balloon PTCA of major Diag 09/2011 for Sx (NSTEMI)   . Anemia   . Schatzki's ring   . Angiodysplasia of intestine   . Aortic stenosis, moderate     2D Echocardiogram 12/13 and 08/22/11 showed moderate aortic stenosis with valve area of 1.28 sq cm. EF of 45 to 50% with severe hypokinesia of the entire inferolateral myocardium  . Ulcer   . Heart murmur   . MI (myocardial infarction) 03/16/2011      Inferolateral ST segment elevation MI  . MI (myocardial infarction)     "he's had 4" (10/30/2013)  . History of blood transfusion ~ 2013    "several; had bleeding in small intestines"  . Lower GI bleeding ~ 2013  . Arthritis     "legs" (10/30/2013)  . ESRD on hemodialysis     [place] [facility], TTS, esrd due to HTN (10/30/2013)  . Right renal artery stenosis     renal stent in 1998  . Pneumonia 07/2013    Past Surgical History  Procedure Laterality Date  . Ureteral stent placement    . Colonoscopy  04/2009    with EGD by Dr.Buccini moderately large hiatal hernia could contribute to his anemia,Schatzki ring which might accout for his intermittent dysphagia/+internal hemorrhoids ;family hx of colon ca  . Esophagogastroduodenoscopy  03/2010    by Dr.Magod/small hiatal hernia with widley patent fibrous ring,minimal bulbitis otherwise nl  . Dialysis fistula creation    . Givens capsule study  04/16/10    SB erosions vs. AVMs, non-specific distal SB inflammation in setting ASA, plavix, NSAIDs  . Esophagogastroduodenoscopy  03/14/2012    Procedure: ESOPHAGOGASTRODUODENOSCOPY (  EGD);  Surgeon: Iva Boop, MD;  Location: [facility] ENDOSCOPY;  Service: Endoscopy;  Laterality: N/A;  . Colonoscopy  03/16/2012    Procedure: COLONOSCOPY;  Surgeon: Iva Boop, MD;  Location: [facility] ENDOSCOPY;  Service: Endoscopy;  Laterality: N/A;  . Renal artery stent Right 08/22/1996    Dr [name], P-154 Palmaz stent mounted on a 6mm x 2cm PowerFlex and deployed in the renal ostium at 7 atmospheres  reduction of 95% lesion to 0% residual without dissection    . Enteroscopy N/A 07/06/2012    Procedure: ENTEROSCOPY;  Surgeon: Hilarie Fredrickson, MD;  Location: [facility] ENDOSCOPY;  Service: Endoscopy;  Laterality: N/A;  . Doppler echocardiography  03/15/2012-[facility]    LV 45-50%, grade 2 diastolic dysfunction; mod AV stenosis; calcified MV annulus with mild MR; PA pressure  . Nm myocar perf wall motion  05/02/2010    EF  49% ; mod perfusion basal inferolateral, basal anterolateral,mid inferolat and mid anterolateral region  . Carotid doppler  04/20/2011    lright common iliac artery0-49%,left common iliac artery 0-49%  . Renal angiogram  01/02/2004    C02 angiography-widely patent renal arteries  . Event monitor  09/02/2011-6/20/2/13    reason afib  . Coronary angioplasty with stent placement  08/2011 & 09/2011    Integrity Resolute DES Stents  Stent #1:  2.10mm x 14 mm (extending to just distal to the bifurcation to the mid OM, Stent #2:  2.18mm x 18mm  Stent #3:  3.31mm x 18mm; covering proximal portion of the old stent into the proximal circumflex and overlapping stent #2 distally, Stent #4: 2.17mm x 8mm (4 overlapping in LCx- OM1; 1 in mid LAD; PTCA only of Major Diag (small caliber)  . Coronary angioplasty with stent placement  2011, 2012    Dr [name]  03/16/11  re-cannulized totally occluded circumflex, stented with Integrity bare metal stent focal 90% stenosis in LAD and diagonal branch   . Cardiac catheterization  10/30/2013  . Esophagogastroduodenoscopy (egd) with esophageal dilation    . Left heart catheterization with coronary angiogram N/A 08/24/2011    Procedure: LEFT HEART CATHETERIZATION WITH CORONARY ANGIOGRAM;  Surgeon: Marykay Lex, MD;  Location: [facility] CATH LAB;  Service: Cardiovascular;  Laterality: N/A;  . Left heart catheterization with coronary angiogram N/A 10/01/2011    Procedure: LEFT HEART CATHETERIZATION WITH CORONARY ANGIOGRAM;  Surgeon: Marykay Lex, MD;  Location: [facility] CATH LAB;  Service: Cardiovascular;  Laterality: N/A;  . Left and right heart catheterization with coronary angiogram N/A 10/30/2013    Procedure: LEFT AND RIGHT HEART CATHETERIZATION WITH CORONARY ANGIOGRAM;  Surgeon: Runell Gess, MD;  Location: [facility] CATH LAB;  Service: Cardiovascular;  Laterality: N/A;  . Abdominal aortagram  10/30/2013    Procedure: ABDOMINAL AORTAGRAM;  Surgeon: Runell Gess, MD;  Location:  [facility] CATH LAB;  Service: Cardiovascular;;    SOCHx:  reports that he quit smoking about 22 years ago. His smoking use included Cigarettes. He has a 22.5 pack-year smoking history. His smokeless tobacco use includes Chew. He reports that he does not drink alcohol or use illicit drugs.  FAMHx: Family History  Problem Relation Age of Onset  . Coronary artery disease Father   . Hypertension Mother   . Coronary artery disease Mother   . Colon cancer Brother 50    ALLERGIES: Allergies  Allergen Reactions  . Diltiazem     Not able to tolerate due to SOB    ROS: Pertinent items are  noted in HPI. see H&P for complete ROS  HOME MEDICATIONS: Prior to Admission medications   Medication Sig Start Date End Date Taking? Authorizing Provider  acetaminophen (TYLENOL) 325 MG tablet Take 2 tablets (650 mg total) by mouth every 4 (four) hours as needed for headache or mild pain. 10/31/13  Yes Jia Dottavio K Mckynlie Vanderslice, PA-C  acetaminophen-codeine (TYLENOL #3) 300-30 MG per tablet Take 1 tablet by mouth every 4 (four) hours as needed for moderate pain.   Yes Historical Provider, MD  allopurinol (ZYLOPRIM) 100 MG tablet Take 100 mg by mouth daily.    Yes Historical Provider, MD  amiodarone (PACERONE) 200 MG tablet Take 0.5 tablets (100 mg total) by mouth at bedtime. 01/15/14  Yes Runell Gess, MD  cinacalcet (SENSIPAR) 30 MG tablet Take 30 mg by mouth daily. Tues Thursdays and saturdays   Yes Historical Provider, MD  clobetasol cream (TEMOVATE) 0.05 % Apply 1 application topically daily. For sores on arm 02/28/14  Yes Historical Provider, MD  doxycycline (VIBRA-TABS) 100 MG tablet Take 100 mg by mouth 2 (two) times daily. Started 05-28-14 for 5 days   Yes Historical Provider, MD  metoprolol tartrate (LOPRESSOR) 25 MG tablet Take 12.5 mg by mouth 2 (two) times daily.   Yes Historical Provider, MD  multivitamin (RENA-VIT) TABS tablet Take 1 tablet by mouth every morning.    Yes Historical Provider, MD   nitroGLYCERIN (NITROSTAT) 0.4 MG SL tablet Place 0.4 mg under the tongue every 5 (five) minutes as needed for chest pain.   Yes Historical Provider, MD  PARoxetine (PAXIL) 20 MG tablet Take 20 mg by mouth daily.   Yes Historical Provider, MD  prasugrel (EFFIENT) 10 MG TABS tablet Take 1 tablet (10 mg total) by mouth daily. 04/18/14  Yes Runell Gess, MD  ranitidine (ZANTAC) 150 MG tablet Take 150 mg by mouth daily.   Yes Historical Provider, MD  sevelamer (RENVELA) 800 MG tablet Take 2,400 mg by mouth 3 (three) times daily with meals.   Yes Historical Provider, MD  temazepam (RESTORIL) 15 MG capsule Take 15-30 mg by mouth at bedtime as needed for sleep.   Yes Historical Provider, MD  pantoprazole (PROTONIX) 40 MG tablet Take 1 tablet (40 mg total) by mouth daily. Patient not taking: Reported on 05/27/2014 05/09/14   Runell Gess, MD    HOSPITAL MEDICATIONS: I have reviewed the patient's current medications.  VITALS: Blood pressure 88/45, pulse 116, temperature 98.2 F (36.8 C), temperature source Oral, resp. rate 20, height  (1.676 m), weight 173 lb 1 oz (78.5 kg), SpO2 93 %.  PHYSICAL EXAM: General appearance: alert, cooperative and no distress Neck: no carotid bruit and no JVD Lungs: clear to auscultation bilaterally Heart: irregularly irregular rhythm and 2/6 systolic murmur Abdomen: soft, non-tender; bowel sounds normal; no masses,  no organomegaly Pulses: diminnished Skin: multiple scab like lesions on his hands and lower extremities Neurologic: Grossly normal  LABS: Results for orders placed or performed during the hospital encounter of 05/31/14 (from the past 24 hour(s))  TSH     Status: Abnormal   Collection Time: 05/31/14  9:45 PM  Result Value Ref Range   TSH 5.326 (H) 0.350 - 4.500 uIU/mL  Comprehensive metabolic panel     Status: Abnormal   Collection Time: 06/01/14  5:25 AM  Result Value Ref Range   Sodium 139 135 - 145 mmol/L   Potassium 4.6 3.5 - 5.1  mmol/L   Chloride 101 96 - 112 mmol/L  CO2 26 19 - 32 mmol/L   Glucose, Bld 97 70 - 99 mg/dL   BUN 22 6 - 23 mg/dL   Creatinine, Ser 2.44 (H) 0.50 - 1.35 mg/dL   Calcium 9.9 8.4 - 01.0 mg/dL   Total Protein 6.6 6.0 - 8.3 g/dL   Albumin 2.2 (L) 3.5 - 5.2 g/dL   AST 42 (H) 0 - 37 U/L   ALT 40 0 - 53 U/L   Alkaline Phosphatase 225 (H) 39 - 117 U/L   Total Bilirubin 2.0 (H) 0.3 - 1.2 mg/dL   GFR calc non Af Amer 11 (L) >90 mL/min   GFR calc Af Amer 13 (L) >90 mL/min   Anion gap 12 5 - 15  CBC     Status: Abnormal   Collection Time: 06/01/14  5:25 AM  Result Value Ref Range   WBC 4.9 4.0 - 10.5 K/uL   RBC 3.13 (L) 4.22 - 5.81 MIL/uL   Hemoglobin 11.6 (L) 13.0 - 17.0 g/dL   HCT 27.2 (L) 53.6 - 64.4 %   MCV 111.5 (H) 78.0 - 100.0 fL   MCH 37.1 (H) 26.0 - 34.0 pg   MCHC 33.2 30.0 - 36.0 g/dL   RDW 03.4 (H) 74.2 - 59.5 %   Platelets 186 150 - 400 K/uL  Glucose, capillary     Status: None   Collection Time: 06/01/14  6:52 AM  Result Value Ref Range   Glucose-Capillary 92 70 - 99 mg/dL    EKG: AF with RBBB  IMAGING: Dg Chest 2 View  05/31/2014   CLINICAL DATA:  Rash on legs, failure to thrive, weakness, htn, gerd, afib, gout, anemia, multiple MI, esrd  EXAM: CHEST  2 VIEW  COMPARISON:  05/27/2014  FINDINGS: Bilateral interstitial thickening is noted similar to the most recent prior study but increased when compared older exams. Right pleural effusion partly obscures the right hemidiaphragm. Minimal left effusion. No pneumothorax.  Cardiac silhouette is mildly enlarged. Aorta is mildly uncoiled. No mediastinal or hilar masses.  Bony thorax is demineralized but grossly intact.  Bony thorax is demineralized but grossly intact.  IMPRESSION: 1. No significant change from the most recent prior study. 2. Small right minimal left pleural effusions. 3. Bilateral interstitial thickening. Mild interstitial edema should be considered in the proper clinical setting. No convincing pneumonia. 4. Mild  stable cardiomegaly.   Electronically Signed   By: Amie Portland M.D.   On: 05/31/2014 17:43    IMPRESSION: Active Problems:   Atrial fibrillation with RVR   S/P- DES to LCx-OM -5/13, BMS to LCx-OM; 10/01/11 with DES to mid LAD and  PTCA of Diag 2   PAF-NSR on Amiodarone, no Coumadin secondary to GI bleeding   End stage renal disease on dialysis; HD T, Th Sat   Aortic stenosis, moderate by cath 10/30/13, echo Jan 2016   Bilateral lower leg cellulitis   HTN (hypertension)   PAD (peripheral artery disease): 60% bilateral common iliac stenosis    HLD (hyperlipidemia)   Bradycardia when in NSR   GERD (gastroesophageal reflux disease)   ICM- EF 45%   GI AVM (gastrointestinal arteriovenous vascular malformation)   RBBB   Gout   RECOMMENDATION: MD to see. Not a candidate for anticoagulation with previous GI bleed. Consider increasing Amiodarone to 200 mg daily (he has had bradycardia in the past when in NSR). Decrease ASA to 81 mg.   Time Spent Directly with Patient: 45 minutes  Roger [name] [phone] beeper 06/01/2014, 3:57 PM

## 2014-06-01 NOTE — Progress Notes (Signed)
Dr. Randol KernElgergawy paged, clarification on lopressor with low bp, MD would like pt to receive med d/t elevated HR. Darrel HooverWilson,Yariela Tison S

## 2014-06-02 LAB — COMPREHENSIVE METABOLIC PANEL
ALT: 34 U/L (ref 0–53)
AST: 38 U/L — ABNORMAL HIGH (ref 0–37)
Albumin: 2.3 g/dL — ABNORMAL LOW (ref 3.5–5.2)
Alkaline Phosphatase: 250 U/L — ABNORMAL HIGH (ref 39–117)
Anion gap: 11 (ref 5–15)
BUN: 14 mg/dL (ref 6–23)
CALCIUM: 10.3 mg/dL (ref 8.4–10.5)
CO2: 25 mmol/L (ref 19–32)
Chloride: 101 mmol/L (ref 96–112)
Creatinine, Ser: 3.4 mg/dL — ABNORMAL HIGH (ref 0.50–1.35)
GFR calc non Af Amer: 17 mL/min — ABNORMAL LOW (ref >90)
GFR, EST AFRICAN AMERICAN: 19 mL/min — AB (ref >90)
GLUCOSE: 83 mg/dL (ref 70–99)
Potassium: 4.5 mmol/L (ref 3.5–5.1)
Sodium: 137 mmol/L (ref 135–145)
TOTAL PROTEIN: 6.8 g/dL (ref 6.0–8.3)
Total Bilirubin: 2.2 mg/dL — ABNORMAL HIGH (ref 0.3–1.2)

## 2014-06-02 LAB — PHOSPHORUS: Phosphorus: 4.9 mg/dL — ABNORMAL HIGH (ref 2.3–4.6)

## 2014-06-02 LAB — CBC
HCT: 36 % — ABNORMAL LOW (ref 39.0–52.0)
Hemoglobin: 12.1 g/dL — ABNORMAL LOW (ref 13.0–17.0)
MCH: 37.8 pg — AB (ref 26.0–34.0)
MCHC: 33.6 g/dL (ref 30.0–36.0)
MCV: 112.5 fL — AB (ref 78.0–100.0)
PLATELETS: 162 10*3/uL (ref 150–400)
RBC: 3.2 MIL/uL — AB (ref 4.22–5.81)
RDW: 16.5 % — ABNORMAL HIGH (ref 11.5–15.5)
WBC: 4.7 10*3/uL (ref 4.0–10.5)

## 2014-06-02 LAB — GLUCOSE, CAPILLARY
GLUCOSE-CAPILLARY: 87 mg/dL (ref 70–99)
Glucose-Capillary: 88 mg/dL (ref 70–99)
Glucose-Capillary: 90 mg/dL (ref 70–99)

## 2014-06-02 LAB — HEMOGLOBIN A1C
HEMOGLOBIN A1C: 6.2 % — AB (ref 4.8–5.6)
Mean Plasma Glucose: 131 mg/dL

## 2014-06-02 MED ORDER — VANCOMYCIN HCL IN DEXTROSE 1-5 GM/200ML-% IV SOLN
1000.0000 mg | INTRAVENOUS | Status: AC
  Administered 2014-06-02: 1000 mg via INTRAVENOUS
  Filled 2014-06-02: qty 200

## 2014-06-02 MED ORDER — HYDROCODONE-ACETAMINOPHEN 5-325 MG PO TABS
1.0000 | ORAL_TABLET | ORAL | Status: DC | PRN
  Administered 2014-06-03 – 2014-06-05 (×3): 1 via ORAL
  Filled 2014-06-02 (×3): qty 1

## 2014-06-02 MED ORDER — MIDODRINE HCL 5 MG PO TABS
10.0000 mg | ORAL_TABLET | ORAL | Status: DC
  Administered 2014-06-02 – 2014-06-05 (×2): 10 mg via ORAL
  Filled 2014-06-02 (×2): qty 2

## 2014-06-02 MED ORDER — VANCOMYCIN HCL IN DEXTROSE 750-5 MG/150ML-% IV SOLN: 750.0000 mg | INTRAVENOUS | Status: DC

## 2014-06-02 MED ORDER — AMIODARONE HCL 200 MG PO TABS
200.0000 mg | ORAL_TABLET | Freq: Every day | ORAL | Status: DC
  Administered 2014-06-02 – 2014-06-04 (×3): 200 mg via ORAL
  Filled 2014-06-02 (×4): qty 1

## 2014-06-02 NOTE — Progress Notes (Addendum)
ANTIBIOTIC CONSULT NOTE - INITIAL  Pharmacy Consult for vancomycin Indication: cellulitis  Allergies  Allergen Reactions  . Diltiazem     Not able to tolerate due to SOB    Patient Measurements: Height: 5\' 6"  (167.6 cm) Weight: 161 lb 6 oz (73.2 kg) IBW/kg (Calculated) : 63.8   Vital Signs: Temp: 98.2 F (36.8 C) (02/20 0411) Temp Source: Oral (02/20 0411) BP: 103/65 mmHg (02/20 0411) Pulse Rate: 101 (02/20 0411) Intake/Output from previous day: 02/19 0701 - 02/20 0700 In: 66 [P.O.:60; I.V.:6] Out: 2500  Intake/Output from this shift:    Labs:  Recent Labs  05/31/14 1526 06/01/14 0525 06/02/14 0450  WBC 5.3 4.9 4.7  HGB 12.9* 11.6* 12.1*  PLT 196 186 162  CREATININE 4.05* 4.67* 3.40*   Estimated Creatinine Clearance: 17.7 mL/min (by C-G formula based on Cr of 3.4). No results for input(s): VANCOTROUGH, VANCOPEAK, VANCORANDOM, GENTTROUGH, GENTPEAK, GENTRANDOM, TOBRATROUGH, TOBRAPEAK, TOBRARND, AMIKACINPEAK, AMIKACINTROU, AMIKACIN in the last 72 hours.   Microbiology: No results found for this or any previous visit (from the past 720 hour(s)).  Medical History: Past Medical History  Diagnosis Date  . HTN (hypertension)   . Hypercholesteremia   . GERD (gastroesophageal reflux disease)   . Atrial fibrillation     Paroxysmal - 2 episodes related to ischemic NSTEMI events  5 & 09/2011  . Gout   . S/P coronary artery stent placement, 10/01/11 with DES to mid LAD and  PTCA of Diag 2 10/01/2009    Inferior STEMI 2011 - BMS to LCx; NSTEMI 5/13 with ISR/thrombosis of Cx BMS -->multiple DES to  LCx ; staged PCI of LAD /Balloon PTCA of major Diag 09/2011 for Sx (NSTEMI)   . Anemia   . Schatzki's ring   . Angiodysplasia of intestine   . Aortic stenosis, moderate     2D Echocardiogram 12/13 and 08/22/11 showed moderate aortic stenosis with valve area of 1.28 sq cm. EF of 45 to 50% with severe hypokinesia of the entire inferolateral myocardium  . Ulcer   . Heart murmur    . MI (myocardial infarction) 03/16/2011    Inferolateral ST segment elevation MI  . MI (myocardial infarction)     "he's had 4" (10/30/2013)  . History of blood transfusion ~ 2013    "several; had bleeding in small intestines"  . Lower GI bleeding ~ 2013  . Arthritis     "legs" (10/30/2013)  . ESRD on hemodialysis     [place] HD, TTS, esrd due to HTN (10/30/2013)  . Right renal artery stenosis     renal stent in 1998  . Pneumonia 07/2013   Assessment: Patient is a 74 y.o M with ESRD on HD TTS with vancomycin and zosyn started on 2/18 for cellulitis.  He received vancomycin 1750mg  loading dose on 2/18 and vancomycin was subsequently d/ced by Dr. Freda MunroSaadat Khan.  He received ~4 hrs of HD with BFR 400 on 2/19 with plan for dialysis again today to get him back on his home schedule.    vanc 2/18>2/18> resume 2/20>> Zosyn 2/18 >  No cultures  Goal of Therapy:  Pre-HD vancomycin level 15-25  Plan:  - Vancomycin 1g IV on 2/20 after his dialysis session, then 750mg  QHD - continue Zosyn 2.25g IV Q8  Aivah Putman P 06/02/2014,11:54 AM

## 2014-06-02 NOTE — Progress Notes (Signed)
Patient Demographics  Roger [name], is a 74 y.o. male, DOB - April 09, 1941, ONG:[id number]  Admit date - 05/31/2014   Admitting Physician Yevonne Pax, MD  Outpatient Primary MD for the patient is [name], MD  LOS - 2   Chief Complaint  Patient presents with  . Rash  . Failure To Thrive      Admission history of present illness:   Roger [name] is a 74 y.o. male presents with bilateral leg swelling and infection. History is given by his wife. For the last 2 weeks he has had increasing swelling of his legs. He has been weak and has not been able to standd on his feet. He has not noted any fevers. Patient has no chills. He has been more sleepy today. 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 His wife states that he was dialyzed . In addition he has had multiple areas on his legs that start off as a blister and then they burst. His wife states that they then turn into sores. He has seen a dermatologist in [place] but states that the cream that was given to him has not really helped much. He is not a diabetic Subjective:   Roger [name] today has, No headache, No chest pain, No abdominal pain - No Nausea, complaints of generalized weakness and fatigue.  Assessment & Plan    Active Problems:   HTN (hypertension)   PAD (peripheral artery disease): 60% bilateral common iliac stenosis    HLD (hyperlipidemia)   Bradycardia when in NSR   GERD (gastroesophageal reflux disease)   S/P- DES to LCx-OM -5/13, BMS to LCx-OM; 10/01/11 with DES to mid LAD and  PTCA of Diag 2   PAF-NSR on Amiodarone, no Coumadin secondary to GI bleeding   End stage renal disease on dialysis; HD T, Th Sat   Aortic stenosis, moderate by cath 10/30/13, echo Jan 2016   ICM- EF 45%   GI AVM (gastrointestinal arteriovenous vascular malformation)   RBBB   Bilateral lower leg cellulitis   Atrial  fibrillation with RVR   Gout  Bilateral LE cellulitis - Calciphylaxis ?? -Continue vancomycin and zosyn per pharmacy - A febrile, no leukocytosis  Multiple areas of skin lesion in upper and lower extremities - Has different morphologies, discussed with family including wife and daughter at bedside regarding punch skin biopsy, at this point family does not want to proceed with any further workup, as they don't want him to be exposed to any painful procedures.  Chronic Atrial Fibrillation -Cardiology consult appreciated, discussed with cardiology, will increase his amiodarone to 200 mg oral daily, will stop his metoprolol, due to soft blood pressure during dialysis. - Not a candidate for anticoagulation giving history of significant GI bleed .  CKD V -Nephrology consulted for hemodialysis  Gout -continue with home medications  Chronic Anemia -currently his Hgb is at baseline   Code Status: DO NOT RESUSCITATE  Family Communication: Multiple family members at bedside, including daughter and wife, they do understand overall how frail patient is, and overall poor prognosis on the long run.  Disposition Plan: Remains inpatient   Procedures  None   Consults  Nephrology Cardiology  Medications  Scheduled Meds: . allopurinol  100  mg Oral Daily  . amiodarone  100 mg Oral QHS  . aspirin  81 mg Oral Daily  . cinacalcet  30 mg Oral Q T,Th,Sat-1800  . clobetasol cream  1 application Topical Daily  . famotidine  20 mg Oral Daily  . folic acid  1 mg Oral Daily  . heparin  5,000 Units Subcutaneous 3 times per day  . metoprolol tartrate  12.5 mg Oral BID  . midodrine  10 mg Oral Q T,Th,Sa-HD  . multivitamin with minerals  1 tablet Oral Daily  . PARoxetine  20 mg Oral Daily  . piperacillin-tazobactam (ZOSYN)  IV  2.25 g Intravenous Q8H  . prasugrel  10 mg Oral Daily  . senna  1 tablet Oral BID  . sevelamer carbonate  2,400 mg Oral TID WC  . sodium chloride  3 mL  Intravenous Q12H  . sodium chloride  3 mL Intravenous Q12H  . thiamine  100 mg Oral Daily  . vancomycin  1,000 mg Intravenous To Hemo  . [START ON 06/05/2014] vancomycin  750 mg Intravenous Q T,Th,Sa-HD   Continuous Infusions:  PRN Meds:.sodium chloride, acetaminophen **OR** acetaminophen, morphine injection, nitroGLYCERIN, ondansetron **OR** ondansetron (ZOFRAN) IV, oxyCODONE, sodium chloride, temazepam  DVT Prophylaxis  heparin  Lab Results  Component Value Date   PLT 162 06/02/2014    Antibiotics    Anti-infectives    Start     Dose/Rate Route Frequency Ordered Stop   06/05/14 1200  vancomycin (VANCOCIN) IVPB 750 mg/150 ml premix     750 mg 150 mL/hr over 60 Minutes Intravenous Every T-Th-Sa (Hemodialysis) 06/02/14 1218     06/02/14 1230  vancomycin (VANCOCIN) IVPB 1000 mg/200 mL premix     1,000 mg 200 mL/hr over 60 Minutes Intravenous To Hemodialysis 06/02/14 1216 06/03/14 1230   06/02/14 1200  vancomycin (VANCOCIN) IVPB 1000 mg/200 mL premix  Status:  Discontinued     1,000 mg 200 mL/hr over 60 Minutes Intravenous Every T-Th-Sa (Hemodialysis) 05/31/14 1612 05/31/14 2029   06/01/14 0400  piperacillin-tazobactam (ZOSYN) IVPB 2.25 g     2.25 g 100 mL/hr over 30 Minutes Intravenous Every 8 hours 05/31/14 1937     05/31/14 1945  piperacillin-tazobactam (ZOSYN) IVPB 3.375 g     3.375 g 100 mL/hr over 30 Minutes Intravenous  Once 05/31/14 1934 05/31/14 2010   05/31/14 1600  vancomycin (VANCOCIN) 1,750 mg in sodium chloride 0.9 % 500 mL IVPB     1,750 mg 250 mL/hr over 120 Minutes Intravenous  Once 05/31/14 1553 05/31/14 1808          Objective:   Filed Vitals:   06/02/14 0411 06/02/14 1200 06/02/14 1209 06/02/14 1230  BP: 103/65 114/76 108/66 95/61  Pulse: 101 94 94 94  Temp: 98.2 F (36.8 C) 98.2 F (36.8 C)    TempSrc: Oral Oral    Resp: 19 15 32 19  Height:      Weight:  73.9 kg (162 lb 14.7 oz)    SpO2: 95% 94%      Wt Readings from Last 3 Encounters:   06/02/14 73.9 kg (162 lb 14.7 oz)  05/27/14 81.194 kg (179 lb)  04/18/14 78.563 kg (173 lb 3.2 oz)     Intake/Output Summary (Last 24 hours) at 06/02/14 1243 Last data filed at 06/01/14 2235  Gross per 24 hour  Intake      6 ml  Output   2500 ml  Net  -2494 ml     Physical Exam  Awake Alert, Oriented X 3, No new F.N deficits, frail, ill-appearing. [place].AT,PERRAL Supple Neck,No JVD, No cervical lymphadenopathy appriciated.  Symmetrical Chest wall movement, Good air movement bilaterally,  ,No Gallops,Rubs or new Murmurs, No Parasternal Heave +ve B.Sounds, Abd Soft, No tenderness, No organomegaly appriciated, No rebound - guarding or rigidity. Bilateral lower extremity erythema, with areas of dried hemorrhage and ulceration, codeine upper and lower extremities.   Data Review   Micro Results No results found for this or any previous visit (from the past 240 hour(s)).  Radiology Reports Dg Chest 2 View  05/31/2014   CLINICAL DATA:  Rash on legs, failure to thrive, weakness, htn, gerd, afib, gout, anemia, multiple MI, esrd  EXAM: CHEST  2 VIEW  COMPARISON:  05/27/2014  FINDINGS: Bilateral interstitial thickening is noted similar to the most recent prior study but increased when compared older exams. Right pleural effusion partly obscures the right hemidiaphragm. Minimal left effusion. No pneumothorax.  Cardiac silhouette is mildly enlarged. Aorta is mildly uncoiled. No mediastinal or hilar masses.  Bony thorax is demineralized but grossly intact.  Bony thorax is demineralized but grossly intact.  IMPRESSION: 1. No significant change from the most recent prior study. 2. Small right minimal left pleural effusions. 3. Bilateral interstitial thickening. Mild interstitial edema should be considered in the proper clinical setting. No convincing pneumonia. 4. Mild stable cardiomegaly.   Electronically Signed   By: Amie Portlandavid  Ormond M.D.   On: 05/31/2014 17:43   [place] Abdomen Complete  06/02/2014    CLINICAL DATA:  Abnormal LFTs, history hypertension, GERD, end-stage renal disease, lower GI bleed, prior cardiac and renal stenting  EXAM: ULTRASOUND ABDOMEN COMPLETE  COMPARISON:  Noted  FINDINGS: Gallbladder: Normally distended without stones or wall thickening.  No pericholecystic fluid or sonographic Murphy sign.  Common bile duct: Diameter: Upper normal caliber 6 mm diameter.  Liver: Normal appearance. Hepatopetal portal venous flow. No intrahepatic biliary dilatation.  IVC: Normal appearance  Pancreas: Portions of body and tail normal appearance, remainder obscured by bowel gas.  Spleen: Calcified granulomata within spleen. 5.9 cm length. No focal mass.  Right Kidney: Length: 7.1 cm. Markedly atrophic and thinned with echogenic cortex. Small cyst 2.0 x 1.9 x 1.6 cm. No gross hydronephrosis or shadowing calcification.  Left Kidney: Length: 6.3 cm. Markedly atrophic with thin echogenic cortex. No gross mass or hydronephrosis.  Abdominal aorta: Predominately obscured by bowel gas  Other findings: No free-fluid.  Small RIGHT pleural effusion  IMPRESSION: Atrophic kidneys consistent with history of end-stage renal disease.  Incomplete visualization of aorta and pancreas.  No acute abnormalities.   Electronically Signed   By: Ulyses SouthwardMark  Boles M.D.   On: 06/02/2014 09:07    CBC  Recent Labs Lab 05/27/14 1051 05/31/14 1526 06/01/14 0525 06/02/14 0450  WBC 6.7 5.3 4.9 4.7  HGB 12.7* 12.9* 11.6* 12.1*  HCT 38.3* 38.4* 34.9* 36.0*  PLT 193 196 186 162  MCV 111.7* 112.3* 111.5* 112.5*  MCH 37.0* 37.7* 37.1* 37.8*  MCHC 33.2 33.6 33.2 33.6  RDW 16.0* 16.3* 16.6* 16.5*  LYMPHSABS  --  1.0  --   --   MONOABS  --  0.5  --   --   EOSABS  --  0.4  --   --   BASOSABS  --  0.1  --   --     Chemistries   Recent Labs Lab 05/27/14 1051 05/31/14 1526 06/01/14 0525 06/02/14 0450  NA 137 141 139 137  K 5.1 4.2 4.6 4.5  CL 92* 99 101 101  CO2 28 27 26 25   GLUCOSE 105* 93 97 83  BUN 23 17 22 14    CREATININE 4.68* 4.05* 4.67* 3.40*  CALCIUM 10.6* 9.3 9.9 10.3  AST 24 61* 42* 38*  ALT 25 47 40 34  ALKPHOS 358* 256* 225* 250*  BILITOT 2.3* 1.7* 2.0* 2.2*   ------------------------------------------------------------------------------------------------------------------ estimated creatinine clearance is 17.7 mL/min (by C-G formula based on Cr of 3.4). ------------------------------------------------------------------------------------------------------------------  Recent Labs  05/31/14 2145  HGBA1C 6.2*   ------------------------------------------------------------------------------------------------------------------ No results for input(s): CHOL, HDL, LDLCALC, TRIG, CHOLHDL, LDLDIRECT in the last 72 hours. ------------------------------------------------------------------------------------------------------------------  Recent Labs  05/31/14 2145  TSH 5.326*   ------------------------------------------------------------------------------------------------------------------ No results for input(s): VITAMINB12, FOLATE, FERRITIN, TIBC, IRON, RETICCTPCT in the last 72 hours.  Coagulation profile  Recent Labs Lab 05/27/14 1051  INR 1.43    No results for input(s): DDIMER in the last 72 hours.  Cardiac Enzymes No results for input(s): CKMB, TROPONINI, MYOGLOBIN in the last 168 hours.  Invalid input(s): CK ------------------------------------------------------------------------------------------------------------------ Invalid input(s): POCBNP     Time Spent in minutes   30 minutes   Eisha Chatterjee M.D on 06/02/2014 at 12:43 PM  Between 7am to 7pm - Pager - [phone]  After 7pm go to [email or web address] - password [email or web address]  And look for the night coverage person covering for me after hours  [facility] Office  [phone]   **Disclaimer: This note may have been dictated with voice recognition software. Similar sounding words can inadvertently  be transcribed and this note may contain transcription errors which may not have been corrected upon publication of note.**

## 2014-06-02 NOTE — Progress Notes (Signed)
Craige CottaKirby paged to clarify if pt should receive Metoprolol 12.5mg  with low BP. Craige CottaKirby informed RN ok to admin medication due to elevated HR. Medication given. Will cont to monitor.

## 2014-06-02 NOTE — Progress Notes (Addendum)
[facility] Rounding Note Subjective:  No input from wound care Had HD yesterday net UF 2.5 liters Still with fairly substantial edema and has some DOE Feet still hurt BP's have been soft Cards saw briefly, recommended reducing ASA, stated amio COULD be increased to 200/day for AF but no change made, and deferred further discussion about his severe AS to outpt setting  Objective Vital signs in last 24 hours: Filed Vitals:   06/01/14 1830 06/01/14 1841 06/01/14 2215 06/02/14 0411  BP: 107/57 114/72 101/67 103/65  Pulse: 107 103 122 101  Temp:  98.2 F (36.8 C) 98.3 F (36.8 C) 98.2 F (36.8 C)  TempSrc:  Oral Oral Oral  Resp:  18 18 19  Height:      Weight:  73.2 kg (161 lb 6 oz)    SpO2:  93% 93% 95%   Weight change: -5.3 kg (-11 lb 11 oz)  Intake/Output Summary (Last 24 hours) at 06/02/14 1030 Last data filed at 06/01/14 2235  Gross per 24 hour  Intake      6 ml  Output   2500 ml  Net  -2494 ml      Physical Exam:  BP 103/65 mmHg  Pulse 101  Temp(Src) 98.2 F (36.8 C) (Oral)  Resp 19  Ht 5' 6" (1.676 m)  Wt 73.2 kg (161 lb 6 oz)  BMI 26.06 kg/m2  SpO2 95% Very nice soft spoken older man   [place]/AT Lungs clear S1S2 No S3 Left AVF + bruit Exts: On arms - numerous lesions some punctate in appearance expecially right upper arm with scabbing, others more serpiginous on dorsum of hands LE's with 1+ shiny edema. Ruburous changes feet with abraded, scaly, red cellulitic areas tops of feet and lower legs; deep serpiginous lesions behind right knee, over right knee, in crease; no nodularity to the lesions (trunk is spared)   Labs: Basic Metabolic Panel:  Recent Labs Lab 05/27/14 1051 05/31/14 1526 06/01/14 0525 06/02/14 0450  NA 137 141 139 137  K 5.1 4.2 4.6 4.5  CL 92* 99 101 101  CO2 28 27 26 25  GLUCOSE 105* 93 97 83  BUN 23 17 22 14  CREATININE 4.68* 4.05* 4.67* 3.40*  CALCIUM 10.6* 9.3 9.9 10.3  PHOS  --   --   --  4.9*    Recent  Labs Lab 05/31/14 1526 06/01/14 0525 06/02/14 0450  AST 61* 42* 38*  ALT 47 40 34  ALKPHOS 256* 225* 250*  BILITOT 1.7* 2.0* 2.2*  PROT 7.4 6.6 6.8  ALBUMIN 2.5* 2.2* 2.3*    Recent Labs Lab 05/27/14 1051 05/31/14 1526 06/01/14 0525 06/02/14 0450  WBC 6.7 5.3 4.9 4.7  NEUTROABS  --  3.3  --   --   HGB 12.7* 12.9* 11.6* 12.1*  HCT 38.3* 38.4* 34.9* 36.0*  MCV 111.7* 112.3* 111.5* 112.5*  PLT 193 196 186 162    Recent Labs Lab 06/01/14 0652 06/02/14 0630 06/02/14 0800  GLUCAP 92 88 87   Studies/Results: Dg Chest 2 View  05/31/2014   CLINICAL DATA:  Rash on legs, failure to thrive, weakness, htn, gerd, afib, gout, anemia, multiple MI, esrd  EXAM: CHEST  2 VIEW  COMPARISON:  05/27/2014  FINDINGS: Bilateral interstitial thickening is noted similar to the most recent prior study but increased when compared older exams. Right pleural effusion partly obscures the right hemidiaphragm. Minimal left effusion. No pneumothorax.  Cardiac silhouette is mildly enlarged. Aorta is mildly uncoiled. No mediastinal   or hilar masses.  Bony thorax is demineralized but grossly intact.  Bony thorax is demineralized but grossly intact.  IMPRESSION: 1. No significant change from the most recent prior study. 2. Small right minimal left pleural effusions. 3. Bilateral interstitial thickening. Mild interstitial edema should be considered in the proper clinical setting. No convincing pneumonia. 4. Mild stable cardiomegaly.   Electronically Signed   By: Lajean Manes M.D.   On: 05/31/2014 17:43   US Abdomen Complete  06/02/2014   CLINICAL DATA:  Abnormal LFTs, history hypertension, GERD, end-stage renal disease, lower GI bleed, prior cardiac and renal stenting  EXAM: ULTRASOUND ABDOMEN COMPLETE  COMPARISON:  Noted  FINDINGS: Gallbladder: Normally distended without stones or wall thickening.  No pericholecystic fluid or sonographic Murphy sign.  Common bile duct: Diameter: Upper normal caliber 6 mm diameter.   Liver: Normal appearance. Hepatopetal portal venous flow. No intrahepatic biliary dilatation.  IVC: Normal appearance  Pancreas: Portions of body and tail normal appearance, remainder obscured by bowel gas.  Spleen: Calcified granulomata within spleen. 5.9 cm length. No focal mass.  Right Kidney: Length: 7.1 cm. Markedly atrophic and thinned with echogenic cortex. Small cyst 2.0 x 1.9 x 1.6 cm. No gross hydronephrosis or shadowing calcification.  Left Kidney: Length: 6.3 cm. Markedly atrophic with thin echogenic cortex. No gross mass or hydronephrosis.  Abdominal aorta: Predominately obscured by bowel gas  Other findings: No free-fluid.  Small RIGHT pleural effusion  IMPRESSION: Atrophic kidneys consistent with history of end-stage renal disease.  Incomplete visualization of aorta and pancreas.  No acute abnormalities.   Electronically Signed   By: Lavonia Dana M.D.   On: 06/02/2014 09:07   Medications:   . allopurinol  100 mg Oral Daily  . amiodarone  100 mg Oral QHS  . aspirin  81 mg Oral Daily  . cinacalcet  30 mg Oral Q T,Th,Sat-1800  . clobetasol cream  1 application Topical Daily  . famotidine  20 mg Oral Daily  . folic acid  1 mg Oral Daily  . heparin  5,000 Units Subcutaneous 3 times per day  . metoprolol tartrate  12.5 mg Oral BID  . midodrine  10 mg Oral Q T,Th,Sa-HD  . multivitamin with minerals  1 tablet Oral Daily  . PARoxetine  20 mg Oral Daily  . piperacillin-tazobactam (ZOSYN)  IV  2.25 g Intravenous Q8H  . prasugrel  10 mg Oral Daily  . senna  1 tablet Oral BID  . sevelamer carbonate  2,400 mg Oral TID WC  . sodium chloride  3 mL Intravenous Q12H  . sodium chloride  3 mL Intravenous Q12H  . thiamine  100 mg Oral Daily   Dialysis Prescription [facility] TTS (last HD PTA 2/18) 3 hours 15 minutes  400/800 2K2Ca AVF Venofer 50/week Doxercalciferol 1 mcg No heparin Last iPTH 502  Last Hb 12.4 Last phos 5.8 120   Assessment/Recommendations 1. ESRD - continueTTS HD. HD today to get  back on schedule, hopefully get off a little more fluid. I personally think needs longer HD time than 3 1/4 hours. 2. Cellulitis LE's - Zosyn. Primary service indicates vanco also but not on MAR and no evidence that pharmacy is dosing. Ask for pharmacy consult. 3. Multiple cutaneous lesions - the presentation (starting as blisters), distribution (upper and lower arms, hands - not just LE's) not typical for calciphylaxis as I have seen it in the past, although certainly in the DDx and he has the substrate for it (elev PTH and  high phos historically) .  Family says dermatology has not seen this full blow set of lesions (he has them in various stages of healing) - I think a deep tissue biopsy would be needed in this case to determine etiology. Unfortunately no dermatology consultation available in the hospital. Local care for now, at some point biopsy. Continue sensipar, control phos. Asked wound care to see and maybe they can help with biopsy. No notes from them as of yet. 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 Cannot tell if dose was increased with 1/20 value of 502. Check PTH and increase dose if appropriate. Checked phosphorus - 4.9 OK 5. Abnormal LFT's - elevated alk phos, bili and mild transaminase elevations. Not clear that any evaluation has been done and these values were normal 11/2013. RUQ US unremarkable 2/19. Persistent elevations 6. HTN - meds and volume control 7. CAD with prior PTCA and stenting asymptomatic at this time 8. Moderate AS by ECHO 2013 - limits fluid removal on HD. Add midodrine with HD.  9. AFib per primary team amio/BB. Cards suggests amio could be increased. Clearly BB cannot due to BP issues.  Jamal Maes, MD [facility] [phone] pager 06/02/2014, 10:30 AM

## 2014-06-02 NOTE — Procedures (Signed)
Patient was seen on dialysis and the procedure was supervised.  BFR 400  Via AVF BP is  114/76.   Patient appears to be tolerating treatment well  Roger [name] A 06/02/2014

## 2014-06-03 LAB — GLUCOSE, CAPILLARY: GLUCOSE-CAPILLARY: 92 mg/dL (ref 70–99)

## 2014-06-03 LAB — COMPREHENSIVE METABOLIC PANEL
ALK PHOS: 225 U/L — AB (ref 39–117)
ALT: 27 U/L (ref 0–53)
AST: 30 U/L (ref 0–37)
Albumin: 2.2 g/dL — ABNORMAL LOW (ref 3.5–5.2)
Anion gap: 9 (ref 5–15)
BUN: 11 mg/dL (ref 6–23)
CHLORIDE: 96 mmol/L (ref 96–112)
CO2: 29 mmol/L (ref 19–32)
Calcium: 10.2 mg/dL (ref 8.4–10.5)
Creatinine, Ser: 3.04 mg/dL — ABNORMAL HIGH (ref 0.50–1.35)
GFR calc Af Amer: 22 mL/min — ABNORMAL LOW (ref >90)
GFR, EST NON AFRICAN AMERICAN: 19 mL/min — AB (ref >90)
GLUCOSE: 92 mg/dL (ref 70–99)
Potassium: 4.1 mmol/L (ref 3.5–5.1)
Sodium: 134 mmol/L — ABNORMAL LOW (ref 135–145)
TOTAL PROTEIN: 6.6 g/dL (ref 6.0–8.3)
Total Bilirubin: 2.1 mg/dL — ABNORMAL HIGH (ref 0.3–1.2)

## 2014-06-03 LAB — PHOSPHORUS: Phosphorus: 5 mg/dL — ABNORMAL HIGH (ref 2.3–4.6)

## 2014-06-03 MED ORDER — SEVELAMER CARBONATE 2.4 G PO PACK
2.4000 g | PACK | Freq: Three times a day (TID) | ORAL | Status: DC
  Administered 2014-06-03 – 2014-06-05 (×3): 2.4 g via ORAL
  Filled 2014-06-03 (×9): qty 1

## 2014-06-03 MED ORDER — POLYETHYLENE GLYCOL 3350 17 G PO PACK
17.0000 g | PACK | Freq: Two times a day (BID) | ORAL | Status: DC
  Administered 2014-06-04 – 2014-06-05 (×3): 17 g via ORAL
  Filled 2014-06-03 (×5): qty 1

## 2014-06-03 NOTE — Progress Notes (Signed)
PT Cancellation Note  Patient Details Name: Roger JarvisLarry L [name] MRN: [id number] DOB: 01-26-1941   Cancelled Treatment:    Reason Eval/Treat Not Completed: Patient not medically ready Patient remains on strict bed rest orders. Please update activity orders when you feel patient is medically ready for physical therapy evaluation.  Berton MountBarbour, [name] 06/03/2014, 8:19 AM Charlsie MerlesLogan Secor Raykwon Hobbs, PT [phone]

## 2014-06-03 NOTE — Progress Notes (Addendum)
Patient Demographics  Roger [name], is a 74 y.o. male, DOB - Apr 06, 1941, ZOX:[id number]  Admit date - 05/31/2014   Admitting Physician Yevonne Pax, MD  Outpatient Primary MD for the patient is [name], MD  LOS - 3   Chief Complaint  Patient presents with  . Rash  . Failure To Thrive      Admission history of present illness:   Roger [name] is a 74 y.o. male presents with bilateral leg swelling and infection. History is given by his wife. For the last 2 weeks he has had increasing swelling of his legs. He has been weak and has not been able to standd on his feet. He has not noted any fevers. Patient has no chills. He has been more sleepy today. 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 His wife states that he was dialyzed . In addition he has had multiple areas on his legs that start off as a blister and then they burst. His wife states that they then turn into sores. He has seen a dermatologist in [place] but states that the cream that was given to him has not really helped much. He is not diabetic Subjective:   [name] today has, No headache, No chest pain, No abdominal pain - No Nausea, complaints of generalized weakness and fatigue.  Assessment & Plan    Active Problems:   HTN (hypertension)   PAD (peripheral artery disease): 60% bilateral common iliac stenosis    HLD (hyperlipidemia)   Bradycardia when in NSR   GERD (gastroesophageal reflux disease)   S/P- DES to LCx-OM -5/13, BMS to LCx-OM; 10/01/11 with DES to mid LAD and  PTCA of Diag 2   PAF-NSR on Amiodarone, no Coumadin secondary to GI bleeding   End stage renal disease on dialysis; HD T, Th Sat   Aortic stenosis, moderate by cath 10/30/13, echo Jan 2016   ICM- EF 45%   GI AVM (gastrointestinal arteriovenous vascular malformation)   RBBB   Bilateral lower leg cellulitis   Atrial  fibrillation with RVR   Gout  Bilateral LE cellulitis - Calciphylaxis ?? -Continue vancomycin and zosyn per pharmacy - A febrile, no leukocytosis  Multiple areas of skin lesion in upper and lower extremities - Has different morphologies, discussed with family including wife and daughter at bedside regarding punch skin biopsy, at this point family does not want to proceed with any further workup, as they don't want him to be exposed to any painful procedures.  Chronic Atrial Fibrillation -Cardiology consult appreciated, discussed with cardiology, increased his amiodarone to 200 mg oral daily,  stopped his metoprolol, due to soft blood pressure during dialysis. - Not a candidate for anticoagulation giving history of significant GI bleed . - Dictated on aspirin by cardiology.  coronary artery disease - Denies any chest pain - On Effient as an outpatient, started on 81 mg oral daily by cardiology.  ESRD -Nephrology consulted for hemodialysis  Gout -continue with home medications  Chronic Anemia -currently his Hgb is at baseline  Mild elevation in LFTs - No acute finding in the right upper quadrant ultrasound.  Code Status: DO NOT RESUSCITATE  Family Communication: Wife at bedside  Disposition Plan: PT consulted  Procedures  None   Consults  Nephrology Cardiology  Medications  Scheduled Meds: . allopurinol  100 mg Oral Daily  . amiodarone  200 mg Oral QHS  . aspirin  81 mg Oral Daily  . cinacalcet  30 mg Oral Q T,Th,Sat-1800  . clobetasol cream  1 application Topical Daily  . famotidine  20 mg Oral Daily  . folic acid  1 mg Oral Daily  . heparin  5,000 Units Subcutaneous 3 times per day  . midodrine  10 mg Oral Q T,Th,Sa-HD  . multivitamin with minerals  1 tablet Oral Daily  . PARoxetine  20 mg Oral Daily  . piperacillin-tazobactam (ZOSYN)  IV  2.25 g Intravenous Q8H  . prasugrel  10 mg Oral Daily  . senna  1 tablet Oral BID  . sevelamer carbonate   2.4 g Oral TID WC  . sodium chloride  3 mL Intravenous Q12H  . sodium chloride  3 mL Intravenous Q12H  . thiamine  100 mg Oral Daily  . [START ON 06/05/2014] vancomycin  750 mg Intravenous Q T,Th,Sa-HD   Continuous Infusions:  PRN Meds:.sodium chloride, acetaminophen **OR** acetaminophen, HYDROcodone-acetaminophen, morphine injection, nitroGLYCERIN, ondansetron **OR** ondansetron (ZOFRAN) IV, sodium chloride, temazepam  DVT Prophylaxis  heparin  Lab Results  Component Value Date   PLT 162 06/02/2014    Antibiotics    Anti-infectives    Start     Dose/Rate Route Frequency Ordered Stop   06/05/14 1200  vancomycin (VANCOCIN) IVPB 750 mg/150 ml premix     750 mg 150 mL/hr over 60 Minutes Intravenous Every T-Th-Sa (Hemodialysis) 06/02/14 1218     06/02/14 1230  vancomycin (VANCOCIN) IVPB 1000 mg/200 mL premix     1,000 mg 200 mL/hr over 60 Minutes Intravenous To Hemodialysis 06/02/14 1216 06/02/14 1523   06/02/14 1200  vancomycin (VANCOCIN) IVPB 1000 mg/200 mL premix  Status:  Discontinued     1,000 mg 200 mL/hr over 60 Minutes Intravenous Every T-Th-Sa (Hemodialysis) 05/31/14 1612 05/31/14 2029   06/01/14 0400  piperacillin-tazobactam (ZOSYN) IVPB 2.25 g     2.25 g 100 mL/hr over 30 Minutes Intravenous Every 8 hours 05/31/14 1937     05/31/14 1945  piperacillin-tazobactam (ZOSYN) IVPB 3.375 g     3.375 g 100 mL/hr over 30 Minutes Intravenous  Once 05/31/14 1934 05/31/14 2010   05/31/14 1600  vancomycin (VANCOCIN) 1,750 mg in sodium chloride 0.9 % 500 mL IVPB     1,750 mg 250 mL/hr over 120 Minutes Intravenous  Once 05/31/14 1553 05/31/14 1808          Objective:   Filed Vitals:   06/02/14 1500 06/02/14 1948 06/03/14 0638 06/03/14 1312  BP: 103/61 106/62 110/61 100/70  Pulse: 79 91 112 68  Temp:  98.6 F (37 C) 98.2 F (36.8 C)   TempSrc:  Oral Oral Oral  Resp: Height:      Weight:      SpO2:  93% 95% 97%    Wt Readings from Last 3 Encounters:   06/02/14 73.9 kg (162 lb 14.7 oz)  05/27/14 81.194 kg (179 lb)  04/18/14 78.563 kg (173 lb 3.2 oz)     Intake/Output Summary (Last 24 hours) at 06/03/14 1427 Last data filed at 06/03/14 1300  Gross per 24 hour  Intake    360 ml  Output      0 ml  Net    360 ml     Physical Exam  Awake Alert,  Oriented X 3, No new F.N deficits, frail, ill-appearing. [place].AT,PERRAL Supple Neck,No JVD, No cervical lymphadenopathy appriciated.  Symmetrical Chest wall movement, Good air movement bilaterally,  ,No Gallops,Rubs or new Murmurs, No Parasternal Heave +ve B.Sounds, Abd Soft, No tenderness, No organomegaly appriciated, No rebound - guarding or rigidity. Bilateral lower extremity erythema, with areas of dried hemorrhage and ulceration, codeine upper and lower extremities.   Data Review   Micro Results No results found for this or any previous visit (from the past 240 hour(s)).  Radiology Reports [place] Abdomen Complete  06/02/2014   CLINICAL DATA:  Abnormal LFTs, history hypertension, GERD, end-stage renal disease, lower GI bleed, prior cardiac and renal stenting  EXAM: ULTRASOUND ABDOMEN COMPLETE  COMPARISON:  Noted  FINDINGS: Gallbladder: Normally distended without stones or wall thickening.  No pericholecystic fluid or sonographic Murphy sign.  Common bile duct: Diameter: Upper normal caliber 6 mm diameter.  Liver: Normal appearance. Hepatopetal portal venous flow. No intrahepatic biliary dilatation.  IVC: Normal appearance  Pancreas: Portions of body and tail normal appearance, remainder obscured by bowel gas.  Spleen: Calcified granulomata within spleen. 5.9 cm length. No focal mass.  Right Kidney: Length: 7.1 cm. Markedly atrophic and thinned with echogenic cortex. Small cyst 2.0 x 1.9 x 1.6 cm. No gross hydronephrosis or shadowing calcification.  Left Kidney: Length: 6.3 cm. Markedly atrophic with thin echogenic cortex. No gross mass or hydronephrosis.  Abdominal aorta: Predominately obscured  by bowel gas  Other findings: No free-fluid.  Small RIGHT pleural effusion  IMPRESSION: Atrophic kidneys consistent with history of end-stage renal disease.  Incomplete visualization of aorta and pancreas.  No acute abnormalities.   Electronically Signed   By: Ulyses SouthwardMark  Boles M.D.   On: 06/02/2014 09:07    CBC  Recent Labs Lab 05/31/14 1526 06/01/14 0525 06/02/14 0450  WBC 5.3 4.9 4.7  HGB 12.9* 11.6* 12.1*  HCT 38.4* 34.9* 36.0*  PLT 196 186 162  MCV 112.3* 111.5* 112.5*  MCH 37.7* 37.1* 37.8*  MCHC 33.6 33.2 33.6  RDW 16.3* 16.6* 16.5*  LYMPHSABS 1.0  --   --   MONOABS 0.5  --   --   EOSABS 0.4  --   --   BASOSABS 0.1  --   --     Chemistries   Recent Labs Lab 05/31/14 1526 06/01/14 0525 06/02/14 0450 06/03/14 0325  NA 141 139 137 134*  K 4.2 4.6 4.5 4.1  CL 99 101 101 96  CO2 27 26 25 29   GLUCOSE 93 97 83 92  BUN 17 22 14 11   CREATININE 4.05* 4.67* 3.40* 3.04*  CALCIUM 9.3 9.9 10.3 10.2  AST 61* 42* 38* 30  ALT 47 40 34 27  ALKPHOS 256* 225* 250* 225*  BILITOT 1.7* 2.0* 2.2* 2.1*   ------------------------------------------------------------------------------------------------------------------ estimated creatinine clearance is 19.8 mL/min (by C-G formula based on Cr of 3.04). ------------------------------------------------------------------------------------------------------------------  Recent Labs  05/31/14 2145  HGBA1C 6.2*   ------------------------------------------------------------------------------------------------------------------ No results for input(s): CHOL, HDL, LDLCALC, TRIG, CHOLHDL, LDLDIRECT in the last 72 hours. ------------------------------------------------------------------------------------------------------------------  Recent Labs  05/31/14 2145  TSH 5.326*   ------------------------------------------------------------------------------------------------------------------ No results for input(s): VITAMINB12, FOLATE,  FERRITIN, TIBC, IRON, RETICCTPCT in the last 72 hours.  Coagulation profile No results for input(s): INR, PROTIME in the last 168 hours.  No results for input(s): DDIMER in the last 72 hours.  Cardiac Enzymes No results for input(s): CKMB, TROPONINI, MYOGLOBIN in the last 168 hours.  Invalid input(s): CK ------------------------------------------------------------------------------------------------------------------ Invalid input(s): POCBNP  Time Spent in minutes   30 minutes   Trenice Mesa M.D on 06/03/2014 at 2:27 PM  Between 7am to 7pm - Pager - [phone]  After 7pm go to [email or web address] - password [email or web address]  And look for the night coverage person covering for me after hours  [facility] Office  [phone]   **Disclaimer: This note may have been dictated with voice recognition software. Similar sounding words can inadvertently be transcribed and this note may contain transcription errors which may not have been corrected upon publication of note.**

## 2014-06-03 NOTE — Progress Notes (Signed)
[facility] Rounding Note Subjective:  Patient had HD again yesterday with some improvement in edema LE's though still present Used midodrine for BP support Still says he is SOB. Has not been OOB. Family says has not walkied since last Monday Family says he was confused after HD yesterday - and has been a little confused this AM He is not asking for much pain medication Family declined skin bx yesterday - today wife says would do (I explained to her reasoning - lesions not typical for calciphylaxis but cannot r/o and would change management) I also explained to wife why I think pt's dialysis time is too short (AS, hypotension - needs longer than 3 hr 15 min time)   Objective Vital signs in last 24 hours: Filed Vitals:   06/02/14 1430 06/02/14 1500 06/02/14 1948 06/03/14 0638  BP: 99/64 103/61 106/62 110/61  Pulse: 97 79 91 112  Temp:   98.6 F (37 C) 98.2 F (36.8 C)  TempSrc:   Oral Oral  Resp: 11 16 16 18   Height:      Weight:      SpO2:   93% 95%   Weight change: 0.7 kg (1 lb 8.7 oz)  Intake/Output Summary (Last 24 hours) at 06/03/14 0854 Last data filed at 06/03/14 0700  Gross per 24 hour  Intake    240 ml  Output      0 ml  Net    240 ml      Physical Exam:  BP 110/61 mmHg  Pulse 112  Temp(Src) 98.2 F (36.8 C) (Oral)  Resp 18  Ht 5' 6"  (1.676 m)  Wt 73.9 kg (162 lb 14.7 oz)  BMI 26.31 kg/m2  SpO2 95% Very nice soft spoken older man   Gets off track of the discussion very easily Breath sounds diminished S1S2 No S3 2/6 murmur USB no diastolic murmur or rub Left AVF + bruit Exts: On arms - numerous lesions some punctate in appearance expecially right upper arm with scabbing, others more serpiginous on dorsum of hands LE's with 1+ shiny edema that is somewhat improved since HD. Ruburous changes feet with abraded, scaly, red cellulitic areas tops of feet and lower legs faded some; deep serpiginous lesions behind right knee, over right knee, in  crease; no nodularity to the lesions (trunk is spared)  Labs: Basic Metabolic Panel:  Recent Labs Lab 05/27/14 1051 05/31/14 1526 06/01/14 0525 06/02/14 0450 06/03/14 0325  NA 137 141 139 137 134*  K 5.1 4.2 4.6 4.5 4.1  CL 92* 99 101 101 96  CO2 28 27 26 25 29   GLUCOSE 105* 93 97 83 92  BUN 23 17 22 14 11   CREATININE 4.68* 4.05* 4.67* 3.40* 3.04*  CALCIUM 10.6* 9.3 9.9 10.3 10.2  PHOS  --   --   --  4.9* 5.0*    Recent Labs Lab 06/01/14 0525 06/02/14 0450 06/03/14 0325  AST 42* 38* 30  ALT 40 34 27  ALKPHOS 225* 250* 225*  BILITOT 2.0* 2.2* 2.1*  PROT 6.6 6.8 6.6  ALBUMIN 2.2* 2.3* 2.2*    Recent Labs Lab 05/27/14 1051 05/31/14 1526 06/01/14 0525 06/02/14 0450  WBC 6.7 5.3 4.9 4.7  NEUTROABS  --  3.3  --   --   HGB 12.7* 12.9* 11.6* 12.1*  HCT 38.3* 38.4* 34.9* 36.0*  MCV 111.7* 112.3* 111.5* 112.5*  PLT 193 196 186 162    Recent Labs Lab 06/01/14 0652 06/02/14 0630 06/02/14 0800 06/02/14 1520  06/03/14 0637  GLUCAP 92 88 87 90 92   Studies/Results: US Abdomen Complete  06/02/2014   CLINICAL DATA:  Abnormal LFTs, history hypertension, GERD, end-stage renal disease, lower GI bleed, prior cardiac and renal stenting  EXAM: ULTRASOUND ABDOMEN COMPLETE  COMPARISON:  Noted  FINDINGS: Gallbladder: Normally distended without stones or wall thickening.  No pericholecystic fluid or sonographic Murphy sign.  Common bile duct: Diameter: Upper normal caliber 6 mm diameter.  Liver: Normal appearance. Hepatopetal portal venous flow. No intrahepatic biliary dilatation.  IVC: Normal appearance  Pancreas: Portions of body and tail normal appearance, remainder obscured by bowel gas.  Spleen: Calcified granulomata within spleen. 5.9 cm length. No focal mass.  Right Kidney: Length: 7.1 cm. Markedly atrophic and thinned with echogenic cortex. Small cyst 2.0 x 1.9 x 1.6 cm. No gross hydronephrosis or shadowing calcification.  Left Kidney: Length: 6.3 cm. Markedly atrophic with  thin echogenic cortex. No gross mass or hydronephrosis.  Abdominal aorta: Predominately obscured by bowel gas  Other findings: No free-fluid.  Small RIGHT pleural effusion  IMPRESSION: Atrophic kidneys consistent with history of end-stage renal disease.  Incomplete visualization of aorta and pancreas.  No acute abnormalities.   Electronically Signed   By: Lavonia Dana M.D.   On: 06/02/2014 09:07   Medications:   . allopurinol  100 mg Oral Daily  . amiodarone  200 mg Oral QHS  . aspirin  81 mg Oral Daily  . cinacalcet  30 mg Oral Q T,Th,Sat-1800  . clobetasol cream  1 application Topical Daily  . famotidine  20 mg Oral Daily  . folic acid  1 mg Oral Daily  . heparin  5,000 Units Subcutaneous 3 times per day  . midodrine  10 mg Oral Q T,Th,Sa-HD  . multivitamin with minerals  1 tablet Oral Daily  . PARoxetine  20 mg Oral Daily  . piperacillin-tazobactam (ZOSYN)  IV  2.25 g Intravenous Q8H  . prasugrel  10 mg Oral Daily  . senna  1 tablet Oral BID  . sevelamer carbonate  2,400 mg Oral TID WC  . sodium chloride  3 mL Intravenous Q12H  . sodium chloride  3 mL Intravenous Q12H  . thiamine  100 mg Oral Daily  . [START ON 06/05/2014] vancomycin  750 mg Intravenous Q T,Th,Sa-HD   Dialysis Prescription [facility] TTS (last HD PTA 2/18) 3 hours 15 minutes  400/800 2K2Ca AVF Venofer 50/week Doxercalciferol 1 mcg No heparin. EDW 76 kg Patient needs longer treatment time due to hypotension - will be 4 hours from now on. Also (if weights correct) needs lower EDW) Last iPTH 502  Last Hb 12.4 Last phos 5.8 120   Assessment/Recommendations 1. ESRD - continueTTS HD. HD yesterday so now back on schedule. I personally think needs longer HD time than 3 1/4 hours - discussed this with wife. Plan 4 hour treatment for Tuesday.  2. Cellulitis LE's - rec'd po doxy PTA, now vanco and zosyn and concensus from family + observation since admission is that redness of LE's is fading with ATB's.   3. Multiple cutaneous lesions  - the presentation (starting as blisters), distribution (upper and lower arms, hands - not just LE's) not typical for calciphylaxis as I have seen it in the past, although certainly in the DDx and he has the substrate for it (elev PTH and high phos historically) I still think skin biopsy would be helpful to determine etiology (and would change management if calciphylaxis). Family refused yesterday - I explained  rationale and wife agrees but wants [place] to tell daughter. In the meantime continue sensipar, control phos. No new lesions have appeared per pt.                                                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 Cannot tell if dose was increased with 05/02/14 outpt value of 502. Check PTH (ordered) and adjust dose if appropriate. Checked phosphorus - 4.9 OK 5. Abnormal LFT's - elevated alk phos, bili and mild transaminase elevations. Not clear that any evaluation has been done and these values were normal 11/2013. RUQ US unremarkable 2/19. Persistent elevations 6. HYPOtension - off BB. To get midodrine with HD.  7. CAD with prior PTCA and stenting No chest pain 8. Moderate AS by ECHO 2013 - limits fluid removal on HD. Added midodrine with HD.  9. AFib - amio increased and bb stopped. Hope will help w/BP on HD.   Jamal Maes, MD [facility] [phone] pager 06/03/2014, 8:54 AM

## 2014-06-03 NOTE — Evaluation (Signed)
Physical Therapy Evaluation Patient Details Name: Roger JarvisLarry L [name] MRN: [id number] DOB: [birth year] Today's Date: 06/03/2014   History of Present Illness  Roger [name] is a 74 y.o. male who presents with bilateral leg swelling and infection  Clinical Impression  Pt admitted with the above complications. Pt currently with functional limitations due to the deficits listed below (see PT Problem List). Demonstrates generalized weakness requiring min assist for balance for short distance ambulation but max assist due to lower extremities buckling with increased distance up to 85 feet today. Family states patient was previously able to ambulate within the community using a rollator. He has strong family support and is motivated to improve his functional abilities. Pt will benefit from skilled PT to increase their independence and safety with mobility to allow discharge to the venue listed below.       Follow Up Recommendations CIR (If denied by CIR, will likely need SNF)    Equipment Recommendations   (TBD)    Recommendations for Other Services Rehab consult;OT consult     Precautions / Restrictions Precautions Precautions: Fall Restrictions Weight Bearing Restrictions: No      Mobility  Bed Mobility               General bed mobility comments: In chair at start of therapy  Transfers Overall transfer level: Needs assistance Equipment used: Rolling walker (2 wheeled) Transfers: Sit to/from Stand Sit to Stand: From elevated surface;Mod assist         General transfer comment: Mod assist for boost and balance with notable posterior lean initially requiring correction and assist from PT. VC for hand placement and to increase UE weight bearing through RW for anterior weight shift.  Ambulation/Gait Ambulation/Gait assistance: Min assist;Max assist Ambulation Distance (Feet): 85 Feet Assistive device: Rolling walker (2 wheeled) Gait Pattern/deviations: Step-through  pattern;Decreased stride length;Antalgic;Trunk flexed;Staggering left;Staggering right;Narrow base of support Gait velocity: decreased   General Gait Details: Educated on safe DME use with a rolling walker. Patient needed frequent min assist during bout for loss of balance towards Rt and Lt.  Upon returning to his room, pt stated he felt weak and LEs immediately buckled with Rt hand coming off of RW. Pt required max assist to prevent fall but was able to stand upright with support and take several more steps to sit in chair.  Stairs            Wheelchair Mobility    Modified Rankin (Stroke Patients Only)       Balance Overall balance assessment: Needs assistance Sitting-balance support: No upper extremity supported;Feet supported Sitting balance-Leahy Scale: Fair   Postural control: Posterior lean Standing balance support: Bilateral upper extremity supported Standing balance-Leahy Scale: Poor                               Pertinent Vitals/Pain Pain Assessment: No/denies pain  HR elevated into 120s during therapy session. SpO2 in low 90s.    Home Living Family/patient expects to be discharged to:: Private residence Living Arrangements: Spouse/significant other Available Help at Discharge: Family;Available 24 hours/day Type of Home: Mobile home Home Access: Ramped entrance     Home Layout: One level Home Equipment: Walker - standard;Wheelchair - manual      Prior Function Level of Independence: Needs assistance   Gait / Transfers Assistance Needed: standard walker for mobility  ADL's / Homemaking Assistance Needed: Needs assist with bath/dress  Hand Dominance   Dominant Hand: Right    Extremity/Trunk Assessment   Upper Extremity Assessment: Defer to OT evaluation           Lower Extremity Assessment: Generalized weakness         Communication   Communication: No difficulties  Cognition Arousal/Alertness: Awake/alert Behavior  During Therapy: WFL for tasks assessed/performed Overall Cognitive Status: Impaired/Different from baseline Area of Impairment: Orientation;Problem solving;Safety/judgement Orientation Level: Disoriented to;Time (unsure of month)       Safety/Judgement: Decreased awareness of deficits   Problem Solving: Slow processing      General Comments General comments (skin integrity, edema, etc.): BIL LE edema Rt>Lt with skin shedding and erythema.    Exercises General Exercises - Lower Extremity Ankle Circles/Pumps: AROM;Both;10 reps;Seated Quad Sets: Strengthening;Both;10 reps;Seated Long Arc Quad: Strengthening;Both;10 reps;Seated Hip Flexion/Marching: Strengthening;Both;10 reps;Seated      Assessment/Plan    PT Assessment Patient needs continued PT services  PT Diagnosis Difficulty walking;Abnormality of gait;Generalized weakness   PT Problem List Decreased strength;Decreased range of motion;Decreased activity tolerance;Decreased balance;Decreased mobility;Decreased cognition;Decreased knowledge of use of DME;Decreased skin integrity  PT Treatment Interventions DME instruction;Gait training;Functional mobility training;Therapeutic activities;Therapeutic exercise;Balance training;Neuromuscular re-education;Cognitive remediation;Patient/family education   PT Goals (Current goals can be found in the Care Plan section) Acute Rehab PT Goals Patient Stated Goal: Go home PT Goal Formulation: With patient/family Time For Goal Achievement: 06/17/14 Potential to Achieve Goals: Good    Frequency Min 3X/week   Barriers to discharge        Co-evaluation               End of Session Equipment Utilized During Treatment: Gait belt Activity Tolerance: Patient tolerated treatment well Patient left: in chair;with call bell/phone within reach;with family/visitor present Nurse Communication: Mobility status         Time: 1610-9604 PT Time Calculation (min) (ACUTE ONLY): 26  min   Charges:   PT Evaluation $Initial PT Evaluation Tier I: 1 Procedure PT Treatments $Gait Training: 8-22 mins   PT G CodesBerton Mount 06/03/2014, 4:41 PM Sunday Spillers [place], [place] [phone]

## 2014-06-04 ENCOUNTER — Encounter (HOSPITAL_COMMUNITY): Payer: Self-pay | Admitting: General Practice

## 2014-06-04 LAB — RENAL FUNCTION PANEL
ALBUMIN: 2.1 g/dL — AB (ref 3.5–5.2)
Anion gap: 10 (ref 5–15)
BUN: 20 mg/dL (ref 6–23)
CHLORIDE: 96 mmol/L (ref 96–112)
CO2: 26 mmol/L (ref 19–32)
Calcium: 11.4 mg/dL — ABNORMAL HIGH (ref 8.4–10.5)
Creatinine, Ser: 4.27 mg/dL — ABNORMAL HIGH (ref 0.50–1.35)
GFR calc non Af Amer: 13 mL/min — ABNORMAL LOW (ref >90)
GFR, EST AFRICAN AMERICAN: 15 mL/min — AB (ref >90)
Glucose, Bld: 97 mg/dL (ref 70–99)
PHOSPHORUS: 6.2 mg/dL — AB (ref 2.3–4.6)
Potassium: 4.1 mmol/L (ref 3.5–5.1)
SODIUM: 132 mmol/L — AB (ref 135–145)

## 2014-06-04 LAB — GLUCOSE, CAPILLARY: Glucose-Capillary: 114 mg/dL — ABNORMAL HIGH (ref 70–99)

## 2014-06-04 MED ORDER — DOXYCYCLINE HYCLATE 100 MG PO TABS
100.0000 mg | ORAL_TABLET | Freq: Two times a day (BID) | ORAL | Status: DC
  Administered 2014-06-04 – 2014-06-05 (×2): 100 mg via ORAL
  Filled 2014-06-04 (×3): qty 1

## 2014-06-04 MED ORDER — NEPRO/CARBSTEADY PO LIQD
237.0000 mL | Freq: Three times a day (TID) | ORAL | Status: DC
  Administered 2014-06-04 (×3): 237 mL via ORAL
  Filled 2014-06-04 (×8): qty 237

## 2014-06-04 NOTE — Progress Notes (Signed)
OT Cancellation Note  Patient Details Name: Roger JarvisLarry L Rust MRN: [id number] DOB: 07/28/1940   Cancelled Treatment:    Reason Eval/Treat Not Completed: Other (comment) Pt currently meeting with palliative care team member.   Evern BioMayberry, Mafalda Mcginniss Lynn 06/04/2014, 12:56 PM  [phone]

## 2014-06-04 NOTE — Progress Notes (Addendum)
[facility] Progress Note   Subjective: awake, jerking somewhat. Legs and feet remain painful.   Filed Vitals:   06/03/14 6045 06/03/14 1312 06/03/14 2043 06/04/14 0607  BP: 110/61 100/70 147/91 106/75  Pulse: 112 68 107 88  Temp: 98.2 F (36.8 C)  98.2 F (36.8 C) 97.7 F (36.5 C)  TempSrc: Oral Oral Oral Oral  Resp: Height:      Weight:      SpO2: 95% 97% 98% 95%   Exam: Alert, jerking of extremities off and on No jvd Chest R basilar rales, L clear RRR 2/6 harsh SEM, no RG Abd soft, NTND, no ascites or HSM Ext show scattered necrotic skin lesions on bilat LE's and LUE.  Traumatic abrasion w scab L knee is from a fall, the other lesions are scooped out or flat. Also +1-2pitting edema bilat LE's below the knees. Mild erythema Neuro +asterixis, nonfocal, gen weakness        HD: TTS [place] 3h   76kg  400/800  2/2.0 BAth   AVF Heparin none   Last pth 502, Hb 12, P 5.8   Assessment: 1. Cellulitis LE's - on Vanc/ Zosyn IV 2. 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 Lesions cropped up in places he was scratching from edema related pruritis w blistering. The edema is better now. High pth historically 3. MBD cont sensipar 4. ^LFT's 5. ESRD on HD TTS, ^'d time to 4h 6. Hypotension - started on midodrine per HD 7. CAD hx prior stents 8. Moderate AS - prob cause of low BP's and edema accumulation, at least contributing factor 9. 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  Doubt he would tolerate recommended Rx for calciphylaxis ( HD 5d/ wk) and probably is a poor candidate for heart surgery/ valve procedures as well given his frailty, comorbidities. Patient has been questioning further HD according to family, and family is struggling with how to handle patient at home if  he is to continue dialysis given his frailty, pain and declining mobility. These problems are undoubtedly going to progress.  I have presented the option of dialysis withdrawal as a very reasonable option at this time, and the patient and family are going to discuss further amongst themselves.  Will ask for palliative care to see. Have d/w primary.     Vinson Moselle MD  pager [phone]    cell [phone]  06/04/2014, 10:41 AM     Recent Labs Lab 06/02/14 0450 06/03/14 0325 06/04/14 0416  NA 137 134* 132*  K 4.5 4.1 4.1  CL 101 96 96  CO2 GLUCOSE 83 92 97  BUN CREATININE 3.40* 3.04* 4.27*  CALCIUM 10.3 10.2 11.4*  PHOS 4.9* 5.0* 6.2*    Recent Labs Lab 06/01/14 0525 06/02/14 0450 06/03/14 0325 06/04/14 0416  AST 42* 38* 30  --   ALT 40 34 27  --   ALKPHOS 225* 250* 225*  --   BILITOT 2.0* 2.2* 2.1*  --   PROT 6.6 6.8 6.6  --   ALBUMIN 2.2* 2.3* 2.2* 2.1*    Recent Labs Lab 05/31/14 1526 06/01/14 0525 06/02/14 0450  WBC 5.3 4.9 4.7  NEUTROABS 3.3  --   --   HGB 12.9* 11.6*  12.1*  HCT 38.4* 34.9* 36.0*  MCV 112.3* 111.5* 112.5*  PLT 196 186 162   . allopurinol  100 mg Oral Daily  . amiodarone  200 mg Oral QHS  . aspirin  81 mg Oral Daily  . cinacalcet  30 mg Oral Q T,Th,Sat-1800  . clobetasol cream  1 application Topical Daily  . famotidine  20 mg Oral Daily  . feeding supplement (NEPRO CARB STEADY)  237 mL Oral TID BM  . folic acid  1 mg Oral Daily  . heparin  5,000 Units Subcutaneous 3 times per day  . midodrine  10 mg Oral Q T,Th,Sa-HD  . multivitamin with minerals  1 tablet Oral Daily  . PARoxetine  20 mg Oral Daily  . piperacillin-tazobactam (ZOSYN)  IV  2.25 g Intravenous Q8H  . polyethylene glycol  17 g Oral BID  . prasugrel  10 mg Oral Daily  . senna  1 tablet Oral BID  . sevelamer carbonate  2.4 g Oral TID WC  . sodium chloride  3 mL Intravenous Q12H  . sodium chloride  3 mL Intravenous Q12H  . thiamine  100 mg Oral Daily   . [START ON 06/05/2014] vancomycin  750 mg Intravenous Q T,Th,Sa-HD     sodium chloride, acetaminophen **OR** acetaminophen, HYDROcodone-acetaminophen, morphine injection, nitroGLYCERIN, ondansetron **OR** ondansetron (ZOFRAN) IV, sodium chloride, temazepam

## 2014-06-04 NOTE — Care Management Note (Signed)
    Page 1 of 1   06/05/2014     11:07:35 AM CARE MANAGEMENT NOTE 06/05/2014  Patient:  Roger [name],Roger [name]   Account Number:  [id number]  Date Initiated:  06/04/2014  Documentation initiated by:  COLE,ANGELA  Subjective/Objective Assessment:   PTA from home  admitted  with bilat. LE cellulitis, hx of HD.     Action/Plan:   Palliative care consult, family to talk about goals of care.   Anticipated DC Date:  06/05/2014   Anticipated DC Plan:  HOME W HOSPICE CARE      DC Planning Services  CM consult      Choice offered to / List presented to:             Status of service:  In process, will continue to follow Medicare Important Message given?  YES (If response is "NO", the following Medicare IM given date fields will be blank) Date Medicare IM given:  06/04/2014 Medicare IM given by:  Gae GallopOLE,ANGELA Date Additional Medicare IM given:   Additional Medicare IM given by:    Discharge Disposition:  HOME W HOSPICE CARE  Per UR Regulation:  Reviewed for med. necessity/level of care/duration of stay  If discussed at Long Length of Stay Meetings, dates discussed:   06/05/2014    Comments:  06/05/2014 @10 :30 Gae GallopAngela Cole RN,BSN Pt states ready to go home, refusing dialysis. MD aware. Pt requesting home hospice. Choice list given to pt/family. [facility]. [facility] [phone]. Informed Candice of pt reqesting BSC needed and hospital bed already in home. Pt 's demographics faxed. Will contact CSW  to arrange ambulance transportation to home.

## 2014-06-04 NOTE — Consult Note (Signed)
Goals of care discussion at bedside with patient, his wife and daughter.  [name] is alert and oriented this afternoon although a bit tired from having walked with PT.  He is now sitting up in a recliner chair resting.  He denies any acute pain.  He will admit that his legs are a "bit sore" but there are no obvious signs of distress.  He leads the conversation by discussing the events of the past 2 or 3 weeks at home.  Due to the progression of wounds surrounding most of his lower extremities he was debilitated to the point where he could no longer walk at home and this precipitated this current admission.  [name] was able to verbalize that he understands that his medical condition is deteriorating and that he has thought about what would happen if he discontinued dialysis.  He verbalizes that he is at peace with the decision and has known several friends over the years at the outpatient Dialysis center make the same decision for themselves.  His wife Dewayne Hatchnn and daughter Neysa BonitoChristy at bedside agree that he has to make the decision for himself.  They both expressed sadness yet support for him, not wanting him to suffer beyond what he is willing.  Dewayne Hatchnn brought up the discussion of home hospice.  We discussed the hospice option, philosophy and benefit of being in a supportive loving environment at home with proper medical/symptom support.  We specifically discussed the support that hospice can provide with symptom management including comfort measures expertise to prevent EOL suffering.   She really wants to care for him at home stating that she has always been by his side.  They celebrated their 50th wedding anniversary this past September.  Both reminisced about their life and home in [place].  He talked about wonderful family memories and good friends and neighbors.  He and Dewayne Hatchnn have another daughter and son, both reside in [place].   [name] confirmed that his choice at this point is to go home and spend time  with his family.  He feels that he has been given all of the information necessary to make this decision for himself.  Recommendation:  Home hospice referral when discharge is appropriate.  [facility].  Symptoms managed well to this point.  Would recommend very low dose yet scheduled pain medication, reasonably assuming that his extremety wounds are at least mildly painful at a minimum and I am not convinced that Mr. Eustice appropriately reports his pain.  Will follow until discharge to assist with questions and or transition to home  Cindra PresumeSue Ellen Ginger Leeth, RN, MSN, [facility] Palliative Integration

## 2014-06-04 NOTE — Clinical Social Work Note (Signed)
CSW has placed FL2 and DNR on chart for physician to sign off on.  Patient may need SNF placement for rehab unless he decides to go home with hospice.  CSW to continue to follow.  Roger KnackEric R. Lecretia [name], MSW, Theresia MajorsLCSWA [phone] 06/04/2014 4:57 PM

## 2014-06-04 NOTE — Progress Notes (Signed)
Mr. Isabell JarvisLarry L Stolz is a 74 y.o. male with history of HTN, ICM with CAD, ESRD on HD, PAF, gait disorder, BLE cellulitis; who was admitted on 05/31/14 with increase in BLE edema with weakness and difficulty walking.  Patient had been seen by dermatologist with BLE lesions being treated with steriod cream but he continues to have poor healing with blistering with rupture and subsequents ulcers. He was started on IV vancomycin and zosyn for treatment. Skin biopsy recommended but wife declined this. He was noted to be in AFib and cardiology was consulted for input. Dr. Rennis GoldenHilty revaluated patient and recommended increasing amiodarone to 200 mg daily but patient not candidate for anticoagulation due to h/o of GIB. He has had issues tolerating HD due to hypotension as well as confusion past HD per reports.  PT evaluation done yesterday and CIR recommended due to FTT and debility.   Spoke with Dr. Arlean HoppingSchertz regarding patient. Family has elected on comfort care as patient is unable to tolerate dialysis and has had subsequent complications due to this.  Will defer CIR consult.

## 2014-06-04 NOTE — Progress Notes (Signed)
Physical Therapy Treatment Patient Details Name: Roger [name] MRN: [id number] DOB: 12-02-1940 Today's Date: 06/04/2014    History of Present Illness Roger [name] is a 74 y.o. male who presents with bilateral leg swelling and infection    PT Comments    Patient motivated to work with therapy today. Able to ambulate 85 feet without buckling, min assist for stability while using a rolling walker for support. Stated UEs fatigued more quickly than LEs today. HR in 120s, SpO2 91-93% on room air. Tolerates therapeutic exercises well, and wife is able to verbalize these exercises accurately for patient to perform. Patient will continue to benefit from skilled physical therapy services to further improve independence with functional mobility.   Follow Up Recommendations  CIR (If denied by CIR, will likely need SNF)     Equipment Recommendations   (TBD)    Recommendations for Other Services Rehab consult;OT consult     Precautions / Restrictions Precautions Precautions: Fall Restrictions Weight Bearing Restrictions: No    Mobility  Bed Mobility               General bed mobility comments: In chair at start of therapy  Transfers Overall transfer level: Needs assistance Equipment used: Rolling walker (2 wheeled) Transfers: Sit to/from Stand Sit to Stand: From elevated surface;Mod assist         General transfer comment: Mod assist for boost and balance with notable posterior lean initially requiring correction and assist from PT. VC for hand placement and to increase UE weight bearing through RW for anterior weight shift. Cues also to maintain walker close to base of support  Ambulation/Gait Ambulation/Gait assistance: Min assist Ambulation Distance (Feet): 85 Feet Assistive device: Rolling walker (2 wheeled) Gait Pattern/deviations: Step-to pattern;Step-through pattern;Decreased step length - left;Trunk flexed;Drifts right/left Gait velocity: decreased   General Gait  Details: Min assist for stability and walker placement today. Frequent cues for upright posture with forward gaze. Decreased Rt knee control and distance increased but improved with tactile cues for knee extension in stance phase. No significant buckling requiring assist today. HR 120s, SpO2 92-93% on room air.   Stairs            Wheelchair Mobility    Modified Rankin (Stroke Patients Only)       Balance                                    Cognition Arousal/Alertness: Awake/alert Behavior During Therapy: WFL for tasks assessed/performed Overall Cognitive Status: Impaired/Different from baseline Area of Impairment: Problem solving;Safety/judgement         Safety/Judgement: Decreased awareness of deficits   Problem Solving: Slow processing;Difficulty sequencing      Exercises General Exercises - Lower Extremity Ankle Circles/Pumps: AROM;Both;10 reps;Seated Quad Sets: Strengthening;Both;10 reps;Seated Gluteal Sets: Strengthening;Both;10 reps;Seated Long Arc Quad: Strengthening;Both;10 reps;Seated Hip Flexion/Marching: Strengthening;Both;10 reps;Seated    General Comments General comments (skin integrity, edema, etc.): BIL LE edema Lt>Rt today with skin shedding and erythema.BIL LE edema Rt>Lt with skin shedding and erythema.      Pertinent Vitals/Pain Pain Assessment: No/denies pain    Home Living Family/patient expects to be discharged to:: Unsure Living Arrangements: Spouse/significant other                  Prior Function            PT Goals (current goals can now be found in the care  plan section) Acute Rehab PT Goals Patient Stated Goal: Go home PT Goal Formulation: With patient/family Time For Goal Achievement: 06/17/14 Potential to Achieve Goals: Good Progress towards PT goals: Progressing toward goals    Frequency  Min 3X/week    PT Plan Current plan remains appropriate    Co-evaluation             End of  Session Equipment Utilized During Treatment: Gait belt Activity Tolerance: Patient tolerated treatment well Patient left: in chair;with call bell/phone within reach;with family/visitor present     Time: 1610-9604 PT Time Calculation (min) (ACUTE ONLY): 17 min  Charges:  $Gait Training: 8-22 mins                    G Codes:      [name] 2014-06-26, 11:12 AM [name], PT [phone]

## 2014-06-04 NOTE — Consult Note (Signed)
Chart review: 12/11 - esrd, HTN, RAS presented with CP and STEMI, cath showed occluded LCx which was stented using BMS. 80%LAD, and disease of OM's. Also renal artery stent was now re-occluded and had bilat iliac art disease 50-60%. 5/13 - had NSTEMI and had DES to CFX/OM. 6/13 - parox afib w CP, [facility], underwent heart cath w DES to ISR of LAD lesion and PTCA of D2 lesion. Got HD. DC'd 7/13 - bleeding AVF, INR high at 7, Hb 8.2.  Got vit K and patch to AVF, bleeding stopped. Transfused 1 unit prbc for H 7.5. Coumadin dec'd to 2.5 daily at DC.  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 Coumadin held inpt and at dc, resume in 1 month if stable. DC's on prasugrel for afib AC. EF down to 45%.  3/14 - sent from HD unit for gen weakness and Hb 7.2. Underwent small bowel endo which found jejunal bleeding ulcer which was clipped. Plan was PPI for life. Rec'd 3u prbc's with Ab difficulties which was new. Cardiology rec'd hold prasugrel for 1-2 weeks d/t bleeding risks. ESRD on HD 4/15 - PNA / bronchitis, ESRD, HTN, CAD, afib, AS severe, anemia 7/15 - DOE, had R/L heart cath for question of severity of AS and cath showed mild/mod AS, moderated LV dysfunction and noncritical CAD.  Severe pulm HTN also.   Vinson Moselleob Higinio Grow MD (pgr) [phone]    ([phone]) [phone] 06/04/2014, 10:40 AM

## 2014-06-04 NOTE — Progress Notes (Signed)
Patient Demographics  Roger [name], is a 74 y.o. male, DOB - 04/28/1940, WUJ:[id number]  Admit date - 05/31/2014   Admitting Physician Yevonne Pax, MD  Outpatient Primary MD for the patient is [name], MD  LOS - 4   Chief Complaint  Patient presents with  . Rash  . Failure To Thrive      Admission history of present illness:   Roger [name] is a 74 y.o. male presents with bilateral leg swelling and infection. History is given by his wife. For the last 2 weeks he has had increasing swelling of his legs. He has been weak and has not been able to standd on his feet. He has not noted any fevers. Patient has no chills. He has been more sleepy today. 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 His wife states that he was dialyzed . In addition he has had multiple areas on his legs that start off as a blister and then they burst. His wife states that they then turn into sores. He has seen a dermatologist in [place] but states that the cream that was given to him has not really helped much. He is not diabetic Subjective:   Roger [name] today has, No headache, No chest pain, No abdominal pain - No Nausea, complaints of generalized weakness and fatigue.  Assessment & Plan    Active Problems:   HTN (hypertension)   PAD (peripheral artery disease): 60% bilateral common iliac stenosis    HLD (hyperlipidemia)   Bradycardia when in NSR   GERD (gastroesophageal reflux disease)   S/P- DES to LCx-OM -5/13, BMS to LCx-OM; 10/01/11 with DES to mid LAD and  PTCA of Diag 2   PAF-NSR on Amiodarone, no Coumadin secondary to GI bleeding   End stage renal disease on dialysis; HD T, Th Sat   Aortic stenosis, moderate by cath 10/30/13, echo Jan 2016   ICM- EF 45%   GI AVM (gastrointestinal arteriovenous vascular malformation)   RBBB   Bilateral lower leg cellulitis   Atrial  fibrillation with RVR   Gout  Bilateral LE cellulitis - Calciphylaxis ?? -Before meals IV vancomycin and Zosyn, will start on oral doxycycline - A febrile, no leukocytosis  Multiple areas of skin lesion in upper and lower extremities - Has different morphologies, discussed with family including wife and daughter at bedside regarding punch skin biopsy, at this point family does not want to proceed with any further workup, as they don't want him to be exposed to any painful procedures.  Chronic Atrial Fibrillation -Cardiology consult appreciated, discussed with cardiology, increased his amiodarone to 200 mg oral daily,  stopped his metoprolol, due to soft blood pressure during dialysis. - Not a candidate for anticoagulation giving history of significant GI bleed . - Started on aspirin by cardiology.  coronary artery disease - Denies any chest pain - On Effient as an outpatient, started on 81 mg oral daily by cardiology.  ESRD -Nephrology consulted for hemodialysis, discussion was carried with the patient and family today, they decided to stop hemodialysis after this week.  Gout -continue with home medications  Chronic Anemia -currently his Hgb is at baseline  Mild elevation in LFTs - No acute finding in the right  upper quadrant ultrasound.  Code Status: DO NOT RESUSCITATE  Family Communication: Wife at bedside, discussed with patient and multiple family members, patient decided to stop hemodialysis after this week as he has some stuff to take care of at home this week, and she doesn't want any further hemodialysis after that.  Disposition Plan:Lanter discharge home tomorrow after hemodialysis  Procedures  None   Consults  Nephrology Cardiology  Medications  Scheduled Meds: . allopurinol  100 mg Oral Daily  . amiodarone  200 mg Oral QHS  . aspirin  81 mg Oral Daily  . cinacalcet  30 mg Oral Q T,Th,Sat-1800  . clobetasol cream  1 application Topical Daily  .  doxycycline  100 mg Oral Q12H  . famotidine  20 mg Oral Daily  . feeding supplement (NEPRO CARB STEADY)  237 mL Oral TID BM  . folic acid  1 mg Oral Daily  . heparin  5,000 Units Subcutaneous 3 times per day  . midodrine  10 mg Oral Q T,Th,Sa-HD  . multivitamin with minerals  1 tablet Oral Daily  . PARoxetine  20 mg Oral Daily  . polyethylene glycol  17 g Oral BID  . prasugrel  10 mg Oral Daily  . senna  1 tablet Oral BID  . sevelamer carbonate  2.4 g Oral TID WC  . sodium chloride  3 mL Intravenous Q12H  . sodium chloride  3 mL Intravenous Q12H  . thiamine  100 mg Oral Daily   Continuous Infusions:  PRN Meds:.sodium chloride, acetaminophen **OR** acetaminophen, HYDROcodone-acetaminophen, morphine injection, nitroGLYCERIN, ondansetron **OR** ondansetron (ZOFRAN) IV, sodium chloride, temazepam  DVT Prophylaxis  heparin  Lab Results  Component Value Date   PLT 162 06/02/2014    Antibiotics    Anti-infectives    Start     Dose/Rate Route Frequency Ordered Stop   06/05/14 1200  vancomycin (VANCOCIN) IVPB 750 mg/150 ml premix  Status:  Discontinued     750 mg 150 mL/hr over 60 Minutes Intravenous Every T-Th-Sa (Hemodialysis) 06/02/14 1218 06/04/14 1732   06/04/14 2200  doxycycline (VIBRA-TABS) tablet 100 mg     100 mg Oral Every 12 hours 06/04/14 1732     06/02/14 1230  vancomycin (VANCOCIN) IVPB 1000 mg/200 mL premix     1,000 mg 200 mL/hr over 60 Minutes Intravenous To Hemodialysis 06/02/14 1216 06/02/14 1523   06/02/14 1200  vancomycin (VANCOCIN) IVPB 1000 mg/200 mL premix  Status:  Discontinued     1,000 mg 200 mL/hr over 60 Minutes Intravenous Every T-Th-Sa (Hemodialysis) 05/31/14 1612 05/31/14 2029   06/01/14 0400  piperacillin-tazobactam (ZOSYN) IVPB 2.25 g  Status:  Discontinued     2.25 g 100 mL/hr over 30 Minutes Intravenous Every 8 hours 05/31/14 1937 06/04/14 1732   05/31/14 1945  piperacillin-tazobactam (ZOSYN) IVPB 3.375 g     3.375 g 100 mL/hr over 30 Minutes  Intravenous  Once 05/31/14 1934 05/31/14 2010   05/31/14 1600  vancomycin (VANCOCIN) 1,750 mg in sodium chloride 0.9 % 500 mL IVPB     1,750 mg 250 mL/hr over 120 Minutes Intravenous  Once 05/31/14 1553 05/31/14 1808          Objective:   Filed Vitals:   06/03/14 1312 06/03/14 2043 06/04/14 0607 06/04/14 1355  BP: 100/70 147/91 106/75 138/88  Pulse: 68 107 88 108  Temp:  98.2 F (36.8 C) 97.7 F (36.5 C) 97.7 F (36.5 C)  TempSrc: Oral Oral Oral Oral  Resp: 17  Height:      Weight:      SpO2: 97% 98% 95% 96%    Wt Readings from Last 3 Encounters:  06/02/14 73.9 kg (162 lb 14.7 oz)  05/27/14 81.194 kg (179 lb)  04/18/14 78.563 kg (173 lb 3.2 oz)     Intake/Output Summary (Last 24 hours) at 06/04/14 1733 Last data filed at 06/04/14 1300  Gross per 24 hour  Intake    220 ml  Output      1 ml  Net    219 ml     Physical Exam  Awake Alert, Oriented X 3, No new F.N deficits, frail, ill-appearing. [place].AT,PERRAL Supple Neck,No JVD, No cervical lymphadenopathy appriciated.  Symmetrical Chest wall movement, Good air movement bilaterally,  ,No Gallops,Rubs or new Murmurs, No Parasternal Heave +ve B.Sounds, Abd Soft, No tenderness, No organomegaly appriciated, No rebound - guarding or rigidity. Bilateral lower extremity erythema, with areas of dried hemorrhage and ulceration, codeine upper and lower extremities.   Data Review   Micro Results No results found for this or any previous visit (from the past 240 hour(s)).  Radiology Reports No results found.  CBC  Recent Labs Lab 05/31/14 1526 06/01/14 0525 06/02/14 0450  WBC 5.3 4.9 4.7  HGB 12.9* 11.6* 12.1*  HCT 38.4* 34.9* 36.0*  PLT 196 186 162  MCV 112.3* 111.5* 112.5*  MCH 37.7* 37.1* 37.8*  MCHC 33.6 33.2 33.6  RDW 16.3* 16.6* 16.5*  LYMPHSABS 1.0  --   --   MONOABS 0.5  --   --   EOSABS 0.4  --   --   BASOSABS 0.1  --   --     Chemistries   Recent Labs Lab 05/31/14 1526  06/01/14 0525 06/02/14 0450 06/03/14 0325 06/04/14 0416  NA 141 139 137 134* 132*  K 4.2 4.6 4.5 4.1 4.1  CL 99 101 101 96 96  CO2 27 26 25 29 26   GLUCOSE 93 97 83 92 97  BUN 17 22 14 11 20   CREATININE 4.05* 4.67* 3.40* 3.04* 4.27*  CALCIUM 9.3 9.9 10.3 10.2 11.4*  AST 61* 42* 38* 30  --   ALT 47 40 34 27  --   ALKPHOS 256* 225* 250* 225*  --   BILITOT 1.7* 2.0* 2.2* 2.1*  --    ------------------------------------------------------------------------------------------------------------------ estimated creatinine clearance is 14.1 mL/min (by C-G formula based on Cr of 4.27). ------------------------------------------------------------------------------------------------------------------ No results for input(s): HGBA1C in the last 72 hours. ------------------------------------------------------------------------------------------------------------------ No results for input(s): CHOL, HDL, LDLCALC, TRIG, CHOLHDL, LDLDIRECT in the last 72 hours. ------------------------------------------------------------------------------------------------------------------ No results for input(s): TSH, T4TOTAL, T3FREE, THYROIDAB in the last 72 hours.  Invalid input(s): FREET3 ------------------------------------------------------------------------------------------------------------------ No results for input(s): VITAMINB12, FOLATE, FERRITIN, TIBC, IRON, RETICCTPCT in the last 72 hours.  Coagulation profile No results for input(s): INR, PROTIME in the last 168 hours.  No results for input(s): DDIMER in the last 72 hours.  Cardiac Enzymes No results for input(s): CKMB, TROPONINI, MYOGLOBIN in the last 168 hours.  Invalid input(s): CK ------------------------------------------------------------------------------------------------------------------ Invalid input(s): POCBNP     Time Spent in minutes   30 minutes   Giovana Faciane M.D on 06/04/2014 at 5:33 PM  Between 7am to 7pm - Pager  - [phone]  After 7pm go to [email or web address] - password [email or web address]  And look for the night coverage person covering for me after hours  [facility] Office  [phone]   **Disclaimer: This note may have been dictated with voice recognition software. Similar sounding words  can inadvertently be transcribed and this note may contain transcription errors which may not have been corrected upon publication of note.**

## 2014-06-04 NOTE — Progress Notes (Signed)
Speech Language Pathology Treatment: Dysphagia  Patient Details Name: Roger [name] MRN: [id number] DOB: May 30, 1940 Today's Date: 06/04/2014 Time: 0525-9102 SLP Time Calculation (min) (ACUTE ONLY): 10 min  Assessment / Plan / Recommendation Clinical Impression  Skilled observation complete with current diet textures. Patient continues to present with slow but effective mastication, with bolus directed to right side however without observed pocketing of difficulty. Patient reports recent fall in which he injured left side of face (did not seek medical care) which may be contributing to above. Independently utilizing appropriate strategies for both oral clearance and facilitation of esophageal motility given h/o esophageal dysfunction. Pateint questioning repeat esophageal dilation. SLP re-educated patient and spouse regarding general strategies for management of buccal pocketing, including liquid washes and lingual sweeps. Current diet remains appropriate. No further SLP needs indicated. Defer f/u for left sided facial injury and GI intervention to primary MD.    HPI HPI: 74 yo male who presents with bilateral leg swelling and infection. Pt has a h/o GERD, schatzki's ring, and PNA.   Pertinent Vitals Pain Assessment: No/denies pain  SLP Plan  All goals met    Recommendations Diet recommendations: Dysphagia 3 (mechanical soft);Thin liquid Liquids provided via: Cup;Straw Medication Administration: Whole meds with liquid Supervision: Patient able to self feed;Intermittent supervision to cue for compensatory strategies Compensations: Slow rate;Small sips/bites;Check for pocketing;Follow solids with liquid Postural Changes and/or Swallow Maneuvers: Seated upright 90 degrees;Upright 30-60 min after meal              Oral Care Recommendations: Oral care BID Follow up Recommendations: None Plan: All goals met    [place] MA, CCC-SLP [phone]   Parke Jandreau Meryl 06/04/2014, 8:52  AM

## 2014-06-05 LAB — PARATHYROID HORMONE, INTACT (NO CA): PTH: 32 pg/mL (ref 15–65)

## 2014-06-05 MED ORDER — OXYCODONE HCL 5 MG PO TABS: 5.0000 mg | ORAL_TABLET | ORAL | Status: AC | PRN

## 2014-06-05 MED ORDER — NEPRO/CARBSTEADY PO LIQD: 237.0000 mL | Freq: Three times a day (TID) | ORAL | Status: AC

## 2014-06-05 NOTE — Progress Notes (Signed)
Pt discharge with family to home with hospice. Pt refused hemodialysis today and forever. Pt states he is tired. Dr. Randol KernElgergawy aware and ok to discharge pt home with family and hospice. Case manager aware having home hospice bedside commode sent to house with hospice.

## 2014-06-05 NOTE — Discharge Summary (Signed)
Roger [name] L [name], 74 y.o., DOB 1940/08/18, MRN [id number]. Admission date: 05/31/2014 Discharge Date 06/05/2014 Primary MD Roger [name],ANGUS G, MD Admitting Physician Yevonne PaxSaadat A Khan, MD  Patient will be [facility], symptoms will be managed by them.  Admission Diagnosis  blood work testing  Discharge Diagnosis   Active Problems:   HTN (hypertension)   PAD (peripheral artery disease): 60% bilateral common iliac stenosis    HLD (hyperlipidemia)   Bradycardia when in NSR   GERD (gastroesophageal reflux disease)   S/P- DES to LCx-OM -5/13, BMS to LCx-OM; 10/01/11 with DES to mid LAD and  PTCA of Diag 2   PAF-NSR on Amiodarone, no Coumadin secondary to GI bleeding   End stage renal disease on dialysis; HD T, Th Sat   Aortic stenosis, moderate by cath 10/30/13, echo Jan 2016   ICM- EF 45%   GI AVM (gastrointestinal arteriovenous vascular malformation)   RBBB   Bilateral lower leg cellulitis   Atrial fibrillation with RVR   Gout      Past Medical History  Diagnosis Date  . HTN (hypertension)   . Hypercholesteremia   . GERD (gastroesophageal reflux disease)   . Atrial fibrillation     Paroxysmal - 2 episodes related to ischemic NSTEMI events  5 & 09/2011  . Gout   . S/P coronary artery stent placement, 10/01/11 with DES to mid LAD and  PTCA of Diag 2 10/01/2009    Inferior STEMI 2011 - BMS to LCx; NSTEMI 5/13 with ISR/thrombosis of Cx BMS -->multiple DES to  LCx ; staged PCI of LAD /Balloon PTCA of major Diag 09/2011 for Sx (NSTEMI)   . Anemia   . Schatzki's ring   . Angiodysplasia of intestine   . Aortic stenosis, moderate     2D Echocardiogram 12/13 and 08/22/11 showed moderate aortic stenosis with valve area of 1.28 sq cm. EF of 45 to 50% with severe hypokinesia of the entire inferolateral myocardium  . Ulcer   . Heart murmur   . MI (myocardial infarction) 03/16/2011    Inferolateral ST segment elevation MI  . MI (myocardial infarction)     "he's had 4" (10/30/2013)  .  History of blood transfusion ~ 2013    "several; had bleeding in small intestines"  . Lower GI bleeding ~ 2013  . Arthritis     "legs" (10/30/2013)  . ESRD on hemodialysis     [place] HD, TTS, esrd due to HTN (10/30/2013)  . Right renal artery stenosis     renal stent in 1998  . Pneumonia 07/2013  . Coronary artery disease     Past Surgical History  Procedure Laterality Date  . Ureteral stent placement    . Colonoscopy  04/2009    with EGD by Dr.Buccini moderately large hiatal hernia could contribute to his anemia,Schatzki ring which might accout for his intermittent dysphagia/+internal hemorrhoids ;family hx of colon ca  . Esophagogastroduodenoscopy  03/2010    by Dr.Magod/small hiatal hernia with widley patent fibrous ring,minimal bulbitis otherwise nl  . Dialysis fistula creation    . Givens capsule study  04/16/10    SB erosions vs. AVMs, non-specific distal SB inflammation in setting ASA, plavix, NSAIDs  . Esophagogastroduodenoscopy  03/14/2012    Procedure: ESOPHAGOGASTRODUODENOSCOPY (EGD);  Surgeon: Iva Booparl E Gessner, MD;  [facility];  Service: Endoscopy;  Laterality: N/A;  . Colonoscopy  03/16/2012    Procedure: COLONOSCOPY;  Surgeon: Iva Booparl E Gessner, MD;  [facility];  Service: Endoscopy;  Laterality: N/A;  . Renal artery stent Right 08/22/1996    Dr [name], P-154 Palmaz stent mounted on a 6mm x 2cm PowerFlex and deployed in the renal ostium at 7 atmospheres  reduction of 95% lesion to 0% residual without dissection    . Enteroscopy N/A 07/06/2012    Procedure: ENTEROSCOPY;  Surgeon: Hilarie Fredrickson, MD;  Location: [facility] ENDOSCOPY;  Service: Endoscopy;  Laterality: N/A;  . Doppler echocardiography  03/15/2012-[facility]    LV 45-50%, grade 2 diastolic dysfunction; mod AV stenosis; calcified MV annulus with mild MR; PA pressure  . Nm myocar perf wall motion  05/02/2010    EF 49% ; mod perfusion basal inferolateral, basal anterolateral,mid inferolat and  mid anterolateral region  . Carotid doppler  04/20/2011    lright common iliac artery0-49%,left common iliac artery 0-49%  . Renal angiogram  01/02/2004    C02 angiography-widely patent renal arteries  . Event monitor  09/02/2011-6/20/2/13    reason afib  . Coronary angioplasty with stent placement  08/2011 & 09/2011    Integrity Resolute DES Stents  Stent #1:  2.82mm x 14 mm (extending to just distal to the bifurcation to the mid OM, Stent #2:  2.20mm x 18mm  Stent #3:  3.39mm x 18mm; covering proximal portion of the old stent into the proximal circumflex and overlapping stent #2 distally, Stent #4: 2.91mm x 8mm (4 overlapping in LCx- OM1; 1 in mid LAD; PTCA only of Major Diag (small caliber)  . Coronary angioplasty with stent placement  2011, 2012    Dr [name]  03/16/11  re-cannulized totally occluded circumflex, stented with Integrity bare metal stent focal 90% stenosis in LAD and diagonal branch   . Cardiac catheterization  10/30/2013  . Esophagogastroduodenoscopy (egd) with esophageal dilation    . Left heart catheterization with coronary angiogram N/A 08/24/2011    Procedure: LEFT HEART CATHETERIZATION WITH CORONARY ANGIOGRAM;  Surgeon: Marykay Lex, MD;  Location: [facility] CATH LAB;  Service: Cardiovascular;  Laterality: N/A;  . Left heart catheterization with coronary angiogram N/A 10/01/2011    Procedure: LEFT HEART CATHETERIZATION WITH CORONARY ANGIOGRAM;  Surgeon: Marykay Lex, MD;  Location: [facility] CATH LAB;  Service: Cardiovascular;  Laterality: N/A;  . Left and right heart catheterization with coronary angiogram N/A 10/30/2013    Procedure: LEFT AND RIGHT HEART CATHETERIZATION WITH CORONARY ANGIOGRAM;  Surgeon: Runell Gess, MD;  Location: [facility] CATH LAB;  Service: Cardiovascular;  Laterality: N/A;  . Abdominal aortagram  10/30/2013    Procedure: ABDOMINAL AORTAGRAM;  Surgeon: Runell Gess, MD;  Location: [facility] CATH LAB;  Service: Cardiovascular;;     Hospital Course See H&P, Labs,  Consult and Test reports for all details in brief, patient was admitted for **  Active Problems:   HTN (hypertension)   PAD (peripheral artery disease): 60% bilateral common iliac stenosis    HLD (hyperlipidemia)   Bradycardia when in NSR   GERD (gastroesophageal reflux disease)   S/P- DES to LCx-OM -5/13, BMS to LCx-OM; 10/01/11 with DES to mid LAD and  PTCA of Diag 2   PAF-NSR on Amiodarone, no Coumadin secondary to GI bleeding   End stage renal disease on dialysis; HD T, Th Sat   Aortic stenosis, moderate by cath 10/30/13, echo Jan 2016   ICM- EF 45%   GI AVM (gastrointestinal arteriovenous vascular malformation)   RBBB   Bilateral lower leg cellulitis   Atrial fibrillation with RVR   Gout   Roger Najjar L  [name] is a 74 y.o. male presents with bilateral leg swelling and infection. History is given by his wife. For the last 2 weeks he has had increasing swelling of his legs. He has been weak and has not been able to standd on his feet. He has not noted any fevers. Patient has no chills. He has been more sleepy today. 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 His wife states that he was dialyzed . In addition he has had multiple areas on his legs that start off as a blister and then they burst. His wife states that they then turn into sores. He has seen a dermatologist in [place] but states that the cream that was given to him has not really helped much. 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  Bilateral LE cellulitis -Treated with IV vancomycin and Zosyn, then transitioned to oral - A febrile, no leukocytosis  Multiple areas of skin lesion in upper and lower extremities - Has different morphologies, discussed with family including wife and daughter at bedside regarding punch skin biopsy, at this point family does not want to proceed with any further workup, as they don't want him to be exposed to any painful procedures.  Chronic Atrial Fibrillation -Cardiology consult appreciated, discussed with cardiology, increased his amiodarone to 200 mg oral daily, stopped his metoprolol, due to soft blood pressure during dialysis. - Not a candidate for anticoagulation giving history of significant GI bleed . - Started on aspirin by cardiology.  coronary artery disease  ESRD -Patient had made decision to stop hemodialysis, given poor was quality he has, seen by palliative care, discharge home with home hospice  Gout -continue with home medications  Chronic Anemia   Mild elevation in LFTs - No acute finding in the right upper quadrant ultrasound.  Code Status: DO NOT RESUSCITATE/Comfort Care  Family Communication: Discussed with patient that multiple family members at bedside  Disposition Plan: Home with home hospice  Procedures  None   Consults  Nephrology Cardiology    Significant Tests:  See full reports for all details    Dg Chest 2 View  05/31/2014   CLINICAL DATA:  Rash on legs, failure to thrive, weakness, htn, gerd, afib, gout, anemia, multiple MI, esrd  EXAM: CHEST  2 VIEW  COMPARISON:  05/27/2014  FINDINGS: Bilateral interstitial thickening is noted similar to the most recent prior study but increased when compared older exams. Right pleural  effusion partly obscures the right hemidiaphragm. Minimal left effusion. No pneumothorax.  Cardiac silhouette is mildly enlarged. Aorta is mildly uncoiled. No mediastinal or hilar masses.  Bony thorax is demineralized but grossly intact.  Bony thorax is demineralized but grossly intact.  IMPRESSION: 1. No significant change from the most recent prior study. 2. Small right minimal left pleural effusions. 3. Bilateral interstitial thickening. Mild interstitial edema should be considered  in the proper clinical setting. No convincing pneumonia. 4. Mild stable cardiomegaly.   Electronically Signed   By: Amie Portland M.D.   On: 05/31/2014 17:43   US Abdomen Complete  06/02/2014   CLINICAL DATA:  Abnormal LFTs, history hypertension, GERD, end-stage renal disease, lower GI bleed, prior cardiac and renal stenting  EXAM: ULTRASOUND ABDOMEN COMPLETE  COMPARISON:  Noted  FINDINGS: Gallbladder: Normally distended without stones or wall thickening.  No pericholecystic fluid or sonographic Murphy sign.  Common bile duct: Diameter: Upper normal caliber 6 mm diameter.  Liver: Normal appearance. Hepatopetal portal venous flow. No intrahepatic biliary dilatation.  IVC: Normal appearance  Pancreas: Portions of body and tail normal appearance, remainder obscured by bowel gas.  Spleen: Calcified granulomata within spleen. 5.9 cm length. No focal mass.  Right Kidney: Length: 7.1 cm. Markedly atrophic and thinned with echogenic cortex. Small cyst 2.0 x 1.9 x 1.6 cm. No gross hydronephrosis or shadowing calcification.  Left Kidney: Length: 6.3 cm. Markedly atrophic with thin echogenic cortex. No gross mass or hydronephrosis.  Abdominal aorta: Predominately obscured by bowel gas  Other findings: No free-fluid.  Small RIGHT pleural effusion  IMPRESSION: Atrophic kidneys consistent with history of end-stage renal disease.  Incomplete visualization of aorta and pancreas.  No acute abnormalities.   Electronically Signed   By: Ulyses Southward  M.D.   On: 06/02/2014 09:07   Dg Chest Portable 1 View  05/27/2014   CLINICAL DATA:  Generalized weakness.  Multiple falls.  EXAM: PORTABLE CHEST - 1 VIEW  COMPARISON:  07/15/2013  FINDINGS: Monitoring leads overlie the patient. Stable enlarged cardiac and mediastinal contours. Tortuosity of the thoracic aorta. Interval development of a small right pleural effusion. Heterogeneous opacities right lung base. Probable minimal atelectasis left lung base.  IMPRESSION: New small right pleural effusion with underlying opacities favored to represent atelectasis. Infection is not excluded.   Electronically Signed   By: Annia Belt M.D.   On: 05/27/2014 12:29     Today   Subjective:   Roger [name] today has no headache,no chest abdominal pain, reports generalized weakness.  Objective:   Blood pressure 127/83, pulse 94, temperature 98.1 F (36.7 C), temperature source Oral, resp. rate 18, height  (1.676 m), weight 73.9 kg (162 lb 14.7 oz), SpO2 94 %.  Intake/Output Summary (Last 24 hours) at 06/05/14 1111 Last data filed at 06/05/14 0800  Gross per 24 hour  Intake    340 ml  Output      1 ml  Net    339 ml    Exam Awake Alert, Oriented X 3, No new F.N deficits, frail, ill-appearing. [place].AT,PERRAL Supple Neck,No JVD, No cervical lymphadenopathy appriciated.  Symmetrical Chest wall movement, Good air movement bilaterally,  ,No Gallops,Rubs or new Murmurs, No Parasternal Heave +ve B.Sounds, Abd Soft, No tenderness, No organomegaly appriciated, No rebound - guarding or rigidity. Bilateral lower extremity erythema, with areas of dried hemorrhage and ulceration, codeine upper and lower extremities.  Data Review   Cultures -   CBC w Diff: Lab Results  Component Value Date   WBC 4.7 06/02/2014   HGB 12.1* 06/02/2014   HCT 36.0* 06/02/2014   HCT 33 11/06/2011   PLT 162 06/02/2014   LYMPHOPCT 19 05/31/2014   MONOPCT 10 05/31/2014   EOSPCT 7* 05/31/2014   BASOPCT 1 05/31/2014   CMP:  Lab Results  Component Value Date   NA 132* 06/04/2014   NA 139 11/06/2011   K 4.1 06/04/2014  K 4.3 11/06/2011   CL 96 06/04/2014   CL 99 11/06/2011   CO2 26 06/04/2014   BUN 20 06/04/2014   CREATININE 4.27* 06/04/2014   CREATININE 5.81* 10/25/2013   PROT 6.6 06/03/2014   ALBUMIN 2.1* 06/04/2014   BILITOT 2.1* 06/03/2014   ALKPHOS 225* 06/03/2014   ALKPHOS 92 11/06/2011   AST 30 06/03/2014   ALT 27 06/03/2014  .  Micro Results No results found for this or any previous visit (from the past 240 hour(s)).   Discharge Instructions      Follow-up Information    Follow up with [facility].   Why:  Home hospice arranged   Contact information:   [address] [place] [address] [phone]       Discharge Medications     Medication List    STOP taking these medications        acetaminophen-codeine 300-30 MG per tablet  Commonly known as:  TYLENOL #3     cinacalcet 30 MG tablet  Commonly known as:  SENSIPAR     doxycycline 100 MG tablet  Commonly known as:  VIBRA-TABS     metoprolol tartrate 25 MG tablet  Commonly known as:  LOPRESSOR     multivitamin Tabs tablet     pantoprazole 40 MG tablet  Commonly known as:  PROTONIX     prasugrel 10 MG Tabs tablet  Commonly known as:  EFFIENT      TAKE these medications        acetaminophen 325 MG tablet  Commonly known as:  TYLENOL  Take 2 tablets (650 mg total) by mouth every 4 (four) hours as needed for headache or mild pain.     amiodarone 200 MG tablet  Commonly known as:  PACERONE  Take 0.5 tablets (100 mg total) by mouth at bedtime.     clobetasol cream 0.05 %  Commonly known as:  TEMOVATE  Apply 1 application topically daily. For sores on arm     feeding supplement (NEPRO CARB STEADY) Liqd  Take 237 mLs by mouth 3 (three) times daily between meals.     nitroGLYCERIN 0.4 MG SL tablet  Commonly known as:  NITROSTAT  Place 0.4 mg under the tongue every 5 (five) minutes as needed  for chest pain.     oxyCODONE 5 MG immediate release tablet  Commonly known as:  Oxy IR/ROXICODONE  Take 1 tablet (5 mg total) by mouth every 4 (four) hours as needed for severe pain.     PARoxetine 20 MG tablet  Commonly known as:  PAXIL  Take 20 mg by mouth daily.     ranitidine 150 MG tablet  Commonly known as:  ZANTAC  Take 150 mg by mouth daily.     sevelamer carbonate 800 MG tablet  Commonly known as:  RENVELA  Take 2,400 mg by mouth 3 (three) times daily with meals.     temazepam 15 MG capsule  Commonly known as:  RESTORIL  Take 15-30 mg by mouth at bedtime as needed for sleep.      ASK your doctor about these medications        allopurinol 100 MG tablet  Commonly known as:  ZYLOPRIM  Take 100 mg by mouth daily.         Total Time in preparing paper work, data evaluation and todays exam - 35 minutes  Connor Foxworthy M.D on 06/05/2014 at 11:11 AM  [facility] Office  [phone]

## 2014-06-05 NOTE — Discharge Instructions (Signed)
[facility]    Activity: As tolerated with Full fall precautions use walker/cane & assistance as needed   Disposition Home with home hospice   Diet: Renal diet , with feeding assistance and aspiration precautions as needed.    On your next visit with your primary care physician please Get Medicines reviewed and adjusted.   Please request your Prim.MD to go over all Hospital Tests and Procedure/Radiological results at the follow up, please get all Hospital records sent to your Prim MD by signing hospital release before you go home.   If you experience worsening of your admission symptoms, develop shortness of breath, life threatening emergency, suicidal or homicidal thoughts you must seek medical attention immediately by calling 911 or calling your MD immediately  if symptoms less severe.  You Must read complete instructions/literature along with all the possible adverse reactions/side effects for all the Medicines you take and that have been prescribed to you. Take any new Medicines after you have completely understood and accpet all the possible adverse reactions/side effects.   Do not drive, operating heavy machinery, perform activities at heights, swimming or participation in water activities or provide baby sitting services if your were admitted for syncope or siezures until you have seen by Primary MD or a Neurologist and advised to do so again.  Do not drive when taking Pain medications.    Do not take more than prescribed Pain, Sleep and Anxiety Medications  Special Instructions: If you have smoked or chewed Tobacco  in the last 2 yrs please stop smoking, stop any regular Alcohol  and or any Recreational drug use.  Wear Seat belts while driving.   Please note  You were cared for by a hospitalist during your hospital stay. If you have any questions about your discharge medications or the care you received while you were in the hospital after you are  discharged, you can call the unit and asked to speak with the hospitalist on call if the hospitalist that took care of you is not available. Once you are discharged, your primary care physician will handle any further medical issues. Please note that NO REFILLS for any discharge medications will be authorized once you are discharged, as it is imperative that you return to your primary care physician (or establish a relationship with a primary care physician if you do not have one) for your aftercare needs so that they can reassess your need for medications and monitor your lab values.

## 2014-06-05 NOTE — Clinical Social Work Note (Addendum)
Physician met with patient and family and patient will be going home with hospice, plan is to discharge to home.  CSW contacted EMS to set up patient transportation to home.  CSW to sign off please re-consult if social work needs arise.  Jones Broom. Blue, MSW, [place]

## 2014-06-05 NOTE — Evaluation (Signed)
Occupational Therapy Evaluation Patient Details Name: Roger [name] MRN: [id number] DOB: 07-24-1940 Today's Date: 06/05/2014    History of Present Illness Roger [name] is a 74 y.o. male who presents with bilateral leg swelling and infection   Clinical Impression   Pt admitted with above. Planning to d/c home with hospice-recommending HHOT as well. Pt limited in session due to knees buckling with standing.    Follow Up Recommendations  Home health OT;Supervision/Assistance - 24 hour (Hospice)    Equipment Recommendations  3 in 1 bedside comode    Recommendations for Other Services       Precautions / Restrictions Precautions Precautions: Fall Restrictions Weight Bearing Restrictions: No      Mobility Bed Mobility Overal bed mobility: Needs Assistance Bed Mobility: Supine to Sit;Rolling;Sit to Sidelying Rolling: Mod assist   Supine to sit: Max assist   Sit to sidelying: Mod assist General bed mobility comments: cues for technique to help scoot HOB; +2 assist to scoot HOB. Cues for technique to go from supine to sit. Assist with legs when going to sidelying position.   Transfers Overall transfer level: Needs assistance Equipment used: Rolling walker (2 wheeled) Transfers: Sit to/from Stand Sit to Stand: Max assist;From elevated surface         General transfer comment: knees buckling in standing position. Assist to prevent fall. Stood from bed x2 and knees buckled both times.    Balance  Pt with decreased balance sitting EOB-jerky movement in UE's and relying on UE support. Min guard-Min A.                                          ADL Overall ADL's : Needs assistance/impaired                 Upper Body Dressing : Sitting;Minimal assistance   Lower Body Dressing: Maximal assistance;Sitting/lateral leans   Toilet Transfer: Maximal assistance (sit to stand from elevated bed.)             General ADL Comments: Educated on  alternative technique for LB ADLs. Told family recommending 3 in 1 and HHOT. Discussed safety. Educated on exercises.  Did not ambulate due to pt's knees buckling at EOB. OT called and spoke with case manager about d/c recommendations.     Vision     Perception     Praxis      Pertinent Vitals/Pain Pain Assessment: Faces Faces Pain Scale: Hurts little more Pain Location:  (bilateral legs) Pain Intervention(s): Monitored during session;Repositioned     Hand Dominance Right   Extremity/Trunk Assessment Upper Extremity Assessment Upper Extremity Assessment: Generalized weakness (pt with jerky movements in UE's.)   Lower Extremity Assessment Lower Extremity Assessment: Defer to PT evaluation       Communication Communication Communication: No difficulties   Cognition Arousal/Alertness: Awake/alert Behavior During Therapy: WFL for tasks assessed/performed Overall Cognitive Status:  (unsure of baseline) Area of Impairment: Problem solving;Safety/judgement;Memory     Memory:  (could not state month of his birthday)   Safety/Judgement: Decreased awareness of safety   Problem Solving: Slow processing     General Comments       Exercises       Shoulder Instructions      Home Living Family/patient expects to be discharged to:: Hospice/Palliative care (home with hospice) Living Arrangements: Spouse/significant other Available Help at Discharge: Family;Available 24 hours/day Type of  Home: Mobile home Home Access: Ramped entrance     Home Layout: One level               Home Equipment: Walker - standard;Wheelchair - manual;Toilet riser          Prior Functioning/Environment Level of Independence: Needs assistance  Gait / Transfers Assistance Needed: standard walker for mobility ADL's / Homemaking Assistance Needed: per PT eval, Needs assist with bath/dress        OT Diagnosis: Generalized weakness;Acute pain   OT Problem List: Decreased  strength;Decreased activity tolerance;Impaired balance (sitting and/or standing);Decreased knowledge of use of DME or AE;Decreased knowledge of precautions;Decreased cognition;Pain;Decreased coordination;Impaired UE functional use   OT Treatment/Interventions: Self-care/ADL training;Therapeutic exercise;DME and/or AE instruction;Therapeutic activities;Patient/family education;Balance training    OT Goals(Current goals can be found in the care plan section) Acute Rehab OT Goals Patient Stated Goal: spouse states pt wants to go home OT Goal Formulation: With patient/family Time For Goal Achievement: 06/12/14 Potential to Achieve Goals: Good ADL Goals Pt Will Perform Lower Body Bathing: with caregiver independent in assisting;sitting/lateral leans;bed level;sit to/from stand Pt Will Perform Lower Body Dressing: with caregiver independent in assisting;sitting/lateral leans;sit to/from stand;bed level Pt Will Transfer to Toilet: with mod assist;stand pivot transfer;bedside commode (with caregiver independent in assisting) Pt Will Perform Toileting - Clothing Manipulation and hygiene: with caregiver independent in assisting;sit to/from stand;sitting/lateral leans;bed level  OT Frequency: Min 2X/week   Barriers to D/C:            Co-evaluation              End of Session Equipment Utilized During Treatment: Gait belt;Rolling walker Nurse Communication: Mobility status;Other (comment) (d/c recommendations)  Activity Tolerance: Other (comment) (knees buckling) Patient left: in bed;with call bell/phone within reach;with bed alarm set;with family/visitor present   Time: [id number] OT Time Calculation (min): 17 min Charges:  OT General Charges $OT Visit: 1 Procedure OT Evaluation $Initial OT Evaluation Tier I: 1 Procedure G-CodesEarlie Raveling:    Analeia Ismael L OTR/L [phone] 06/05/2014, 10:54 AM

## 2014-06-07 ENCOUNTER — Telehealth (HOSPITAL_COMMUNITY): Payer: Self-pay

## 2014-06-07 NOTE — Telephone Encounter (Signed)
06/07/14  Gentleman I spoke to said it wasn't necessary for him to come to therapy

## 2014-06-12 DEATH — deceased

## 2014-07-06 ENCOUNTER — Ambulatory Visit: Payer: Medicare Other | Admitting: Cardiovascular Disease

## 2014-10-08 ENCOUNTER — Other Ambulatory Visit: Payer: Self-pay

## 2015-06-08 IMAGING — CR DG CHEST 2V
2 series · 2 of 2 positions shown · non-contrast
Comparison: 05/27/2014

CLINICAL DATA: Rash on legs, failure to thrive, weakness, htn,
gerd, afib, gout, anemia, multiple MI, esrd

EXAM:
CHEST  2 VIEW

[chest lat]
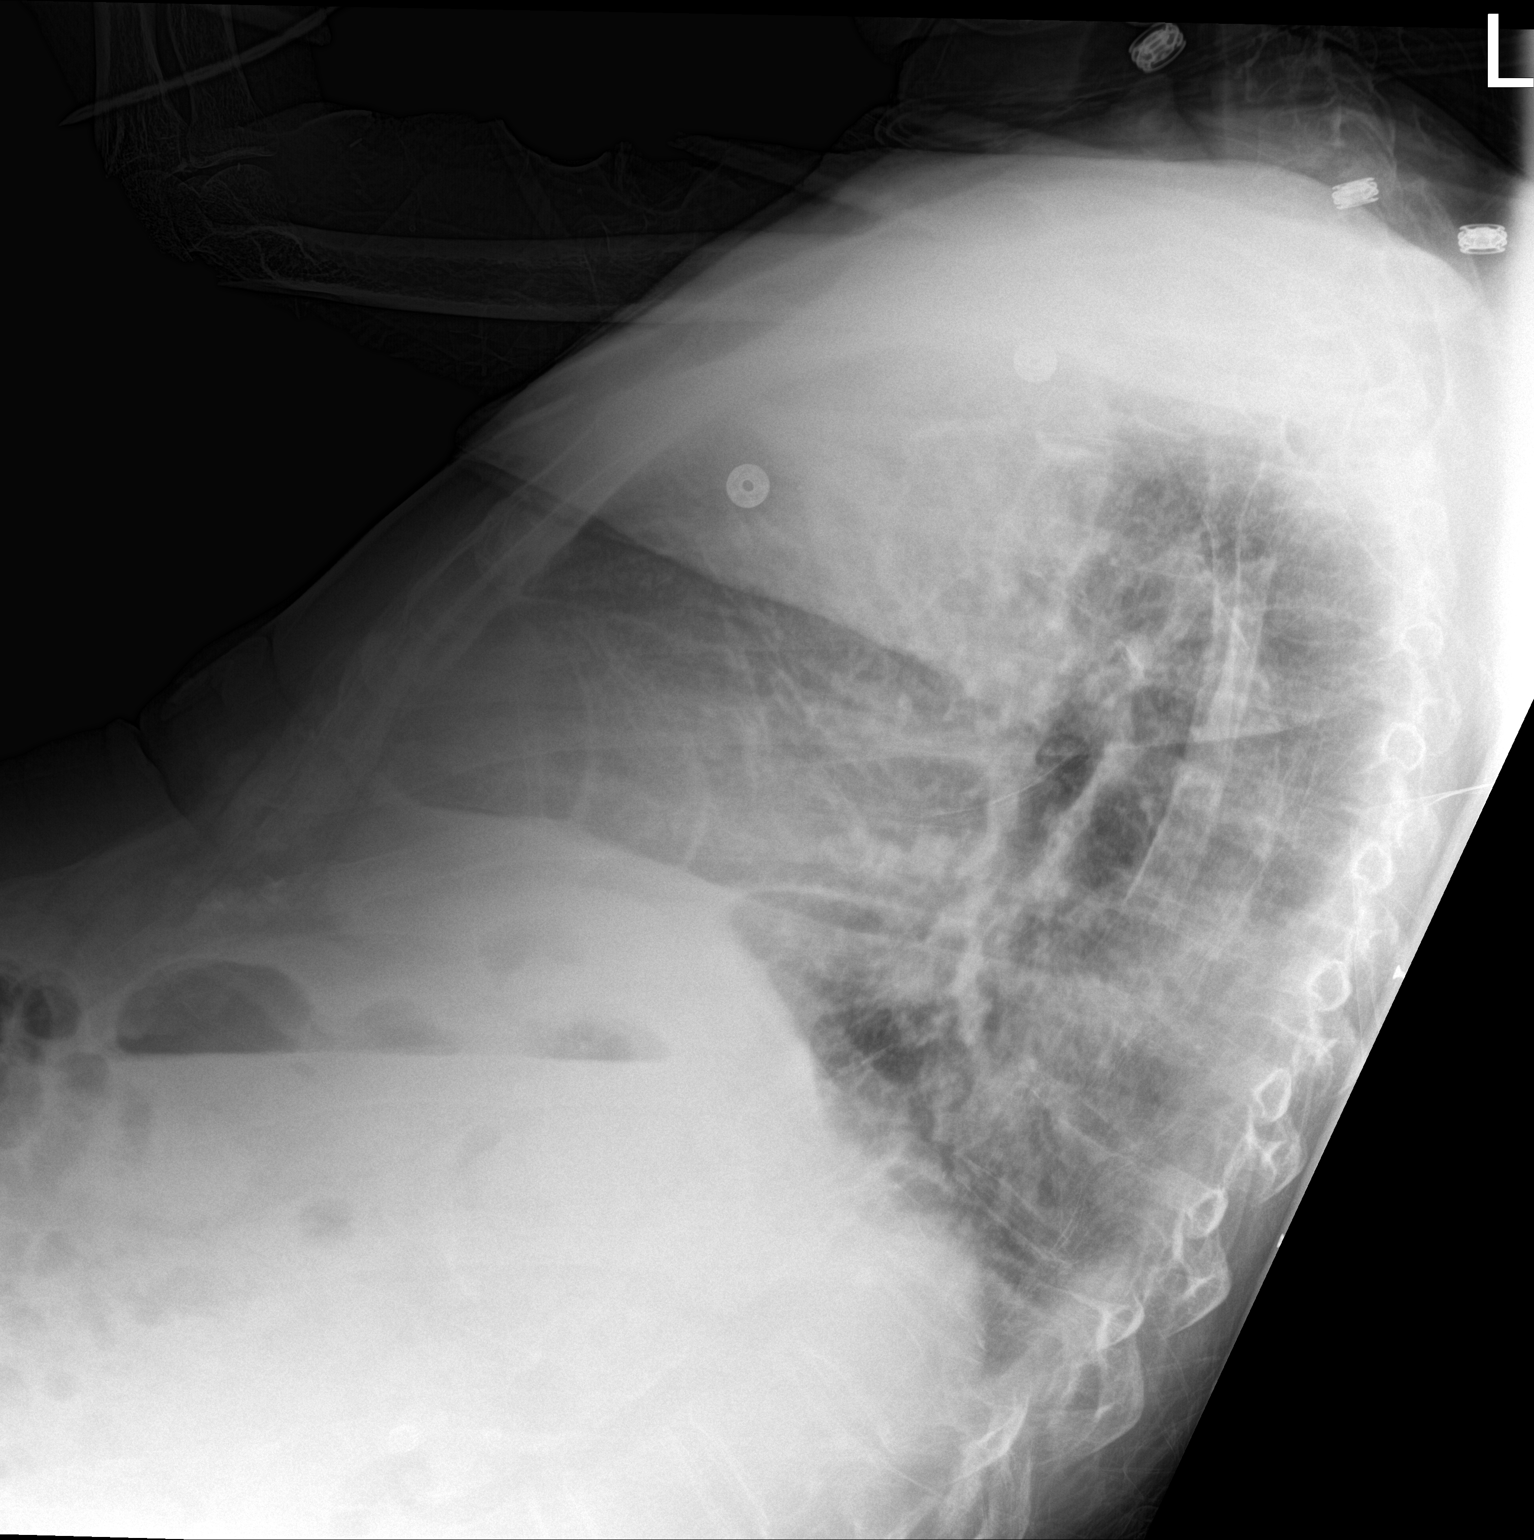

[chest ap]
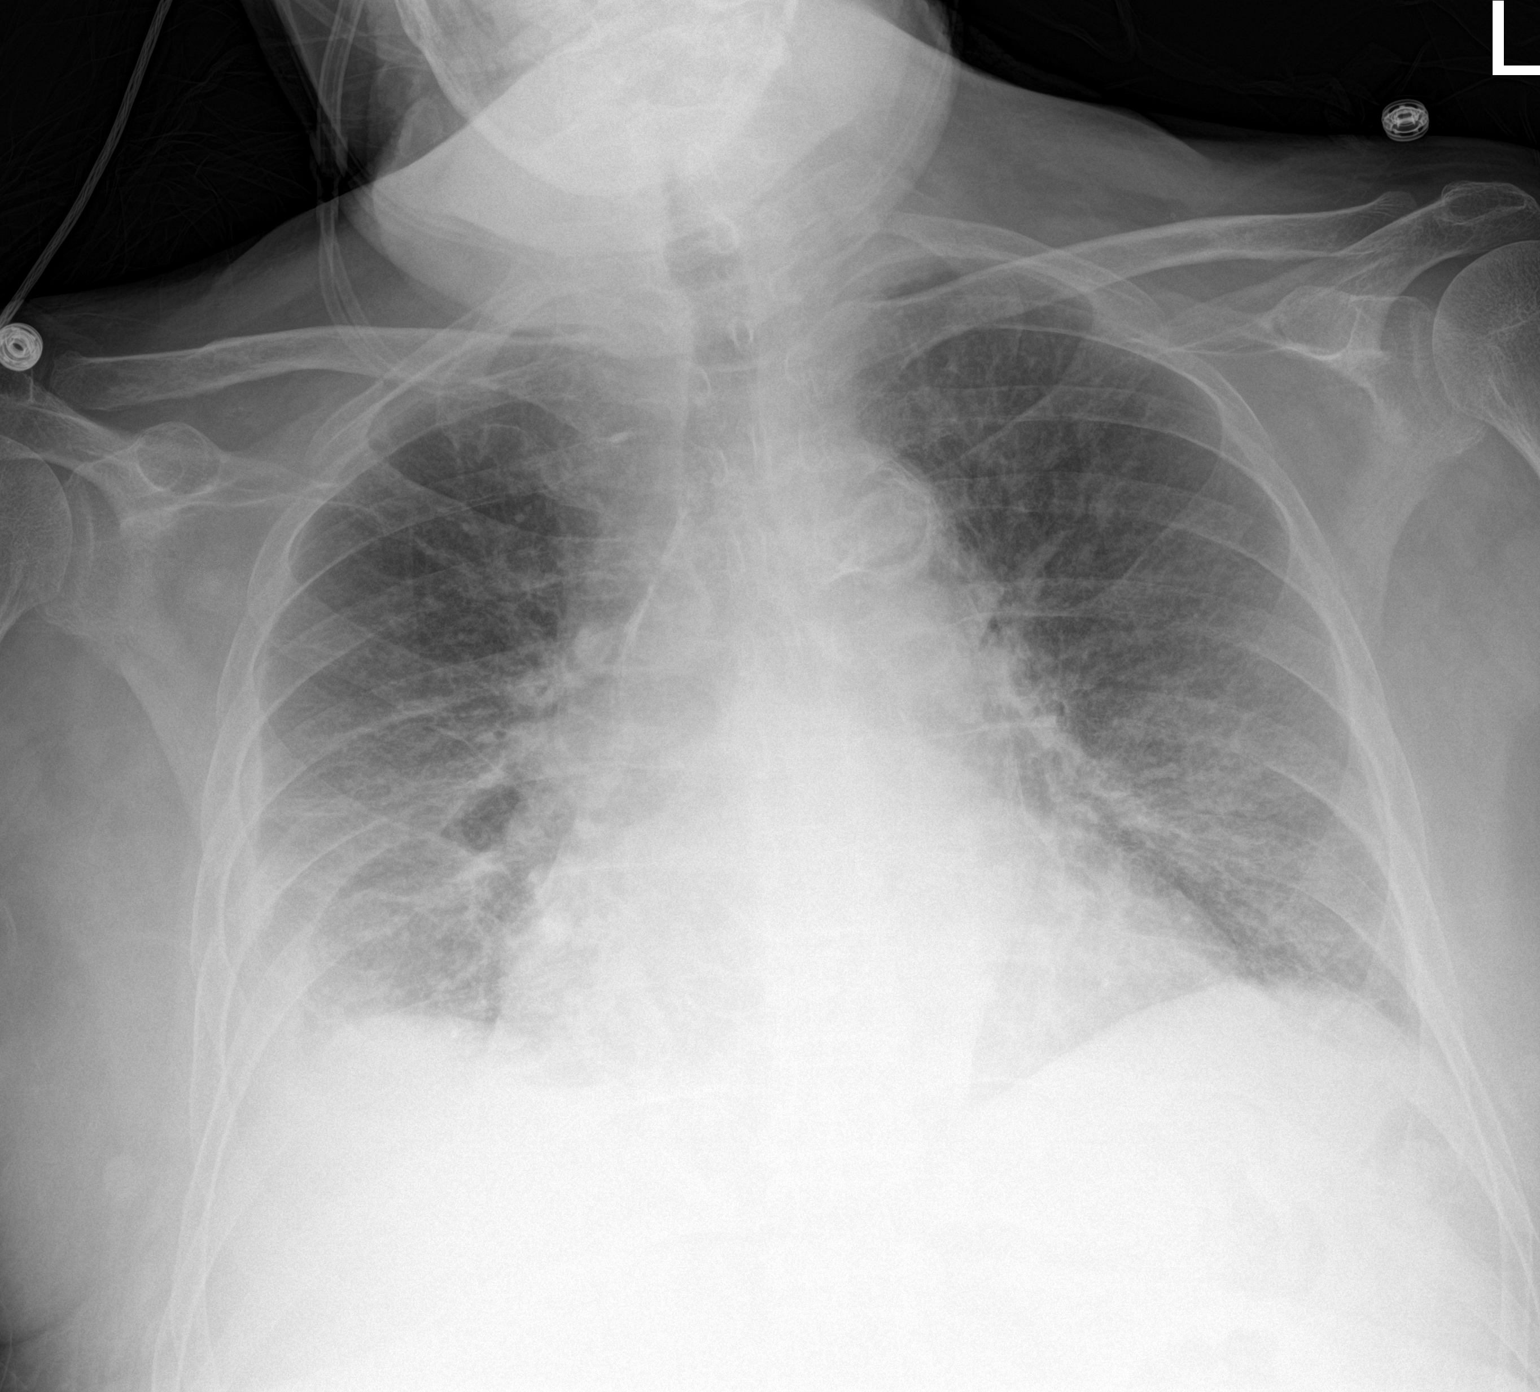

[2 of 2 positions shown; findings below may reference images not displayed]

FINDINGS: Bilateral interstitial thickening is noted similar to the most
recent prior study but increased when compared older exams. Right
pleural effusion partly obscures the right hemidiaphragm. Minimal
left effusion. No pneumothorax.

Cardiac silhouette is mildly enlarged. Aorta is mildly uncoiled. No
mediastinal or hilar masses.

Bony thorax is demineralized but grossly intact.

Bony thorax is demineralized but grossly intact.
IMPRESSION: 1. No significant change from the most recent prior study.
2. Small right minimal left pleural effusions.
3. Bilateral interstitial thickening. Mild interstitial edema should
be considered in the proper clinical setting. No convincing
pneumonia.
4. Mild stable cardiomegaly.

## 2015-06-09 IMAGING — US US ABDOMEN COMPLETE
1 series · 13 of 25 positions shown · non-contrast
Comparison: Noted

CLINICAL DATA: Abnormal LFTs, history hypertension, GERD, end-stage
renal disease, lower GI bleed, prior cardiac and renal stenting

EXAM:
ULTRASOUND ABDOMEN COMPLETE

[Series 1: us abdomen complete · 0.26mm/px · 13 of 41 slices shown]
[im 1/41]
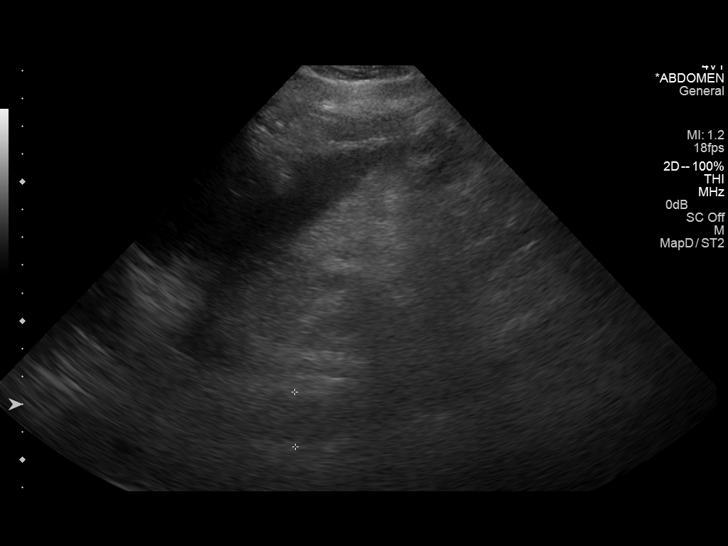
[im 4/41]
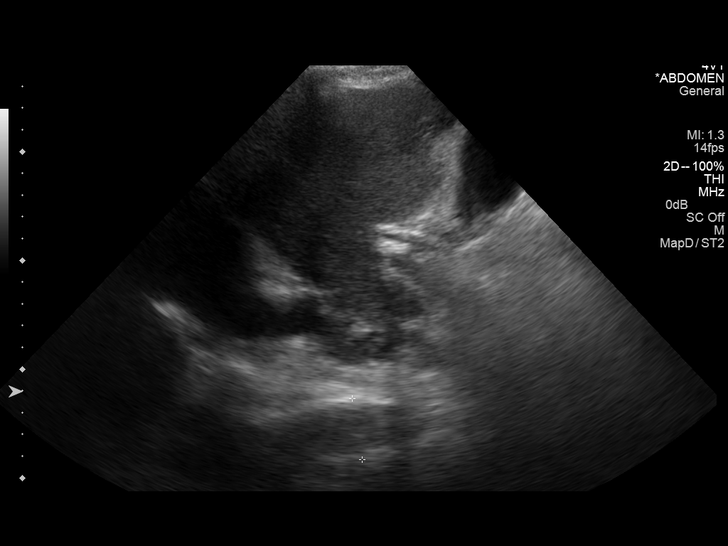
[im 7/41]
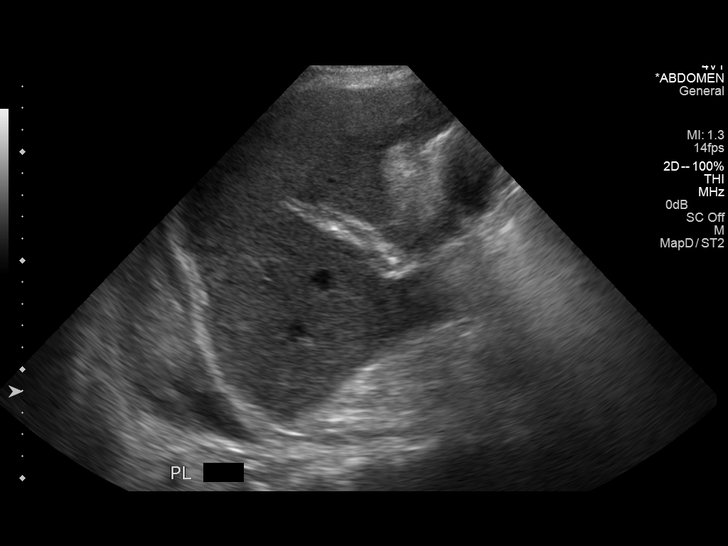
[im 11/41]
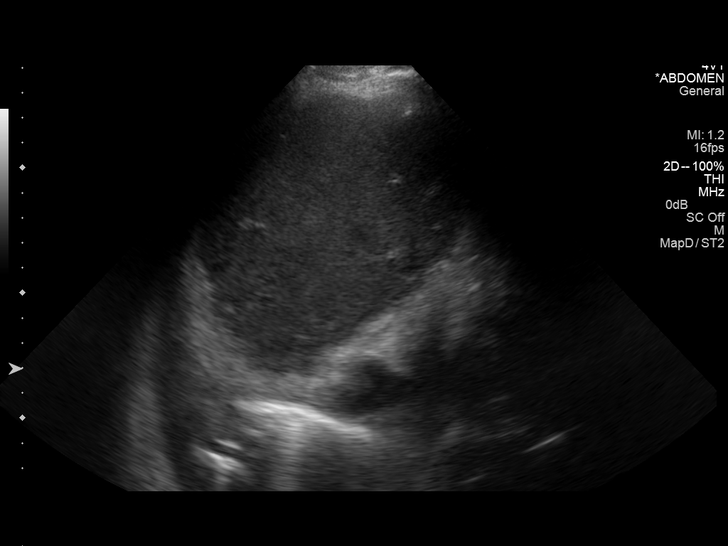
[im 14/41]
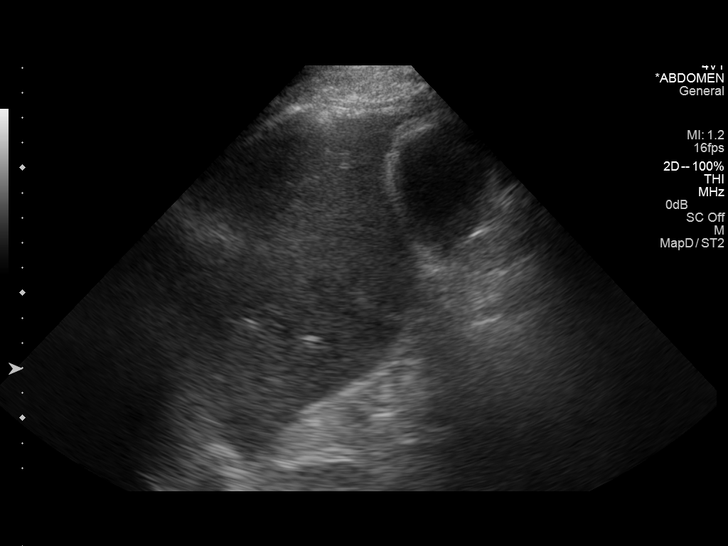
[im 17/41]
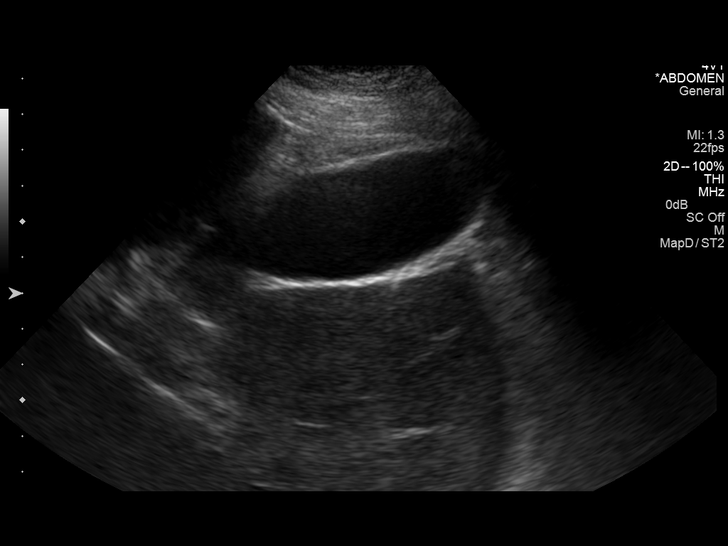
[im 21/41]
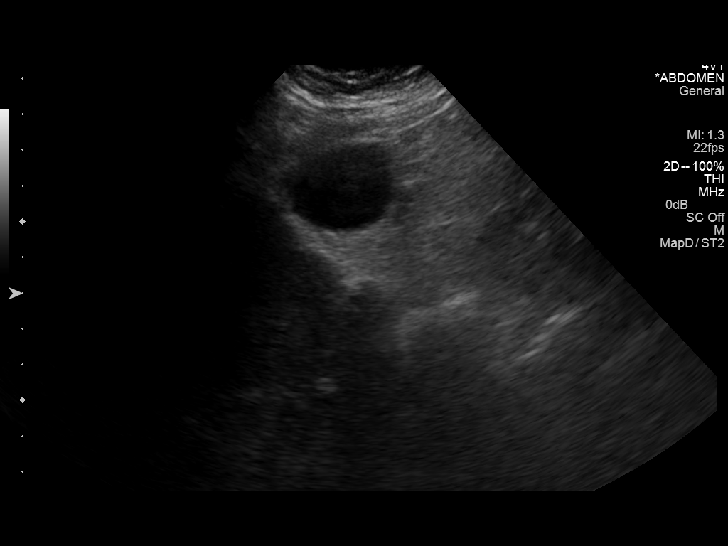
[im 24/41]
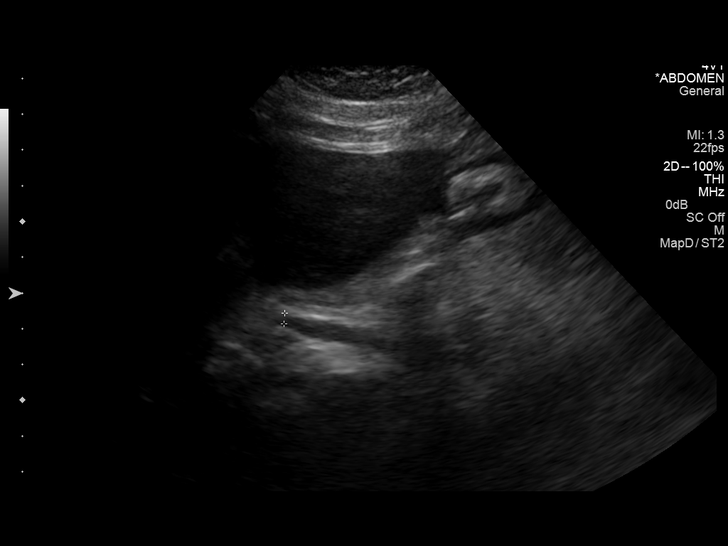
[im 27/41]
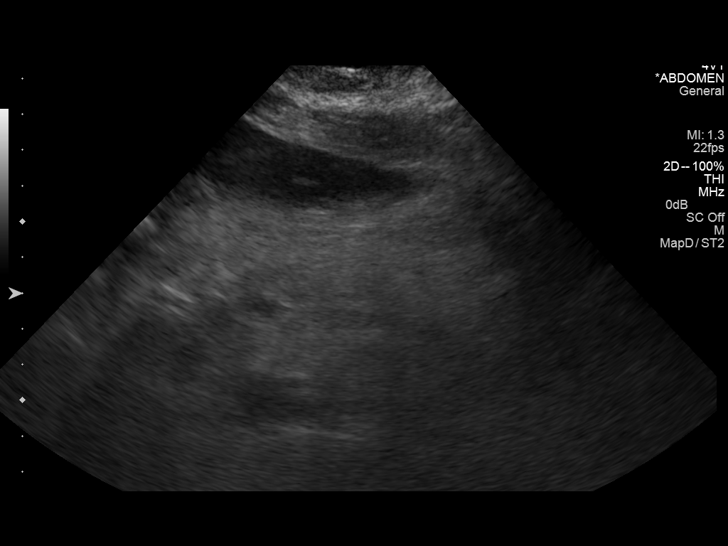
[im 31/41]
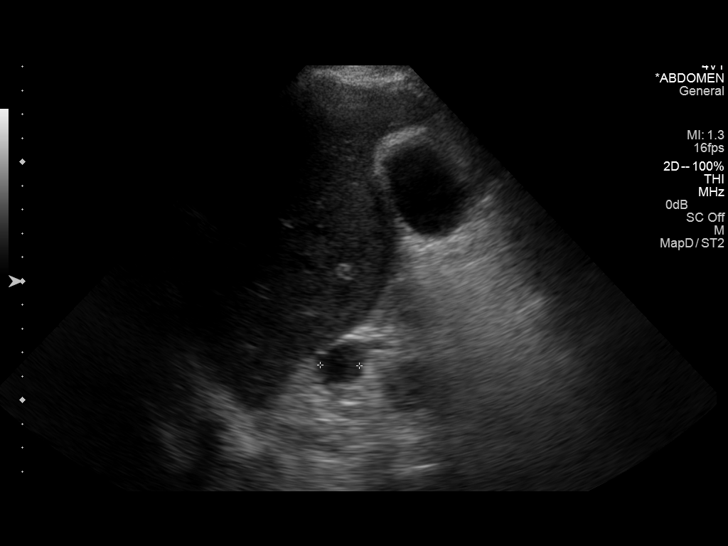
[im 34/41]
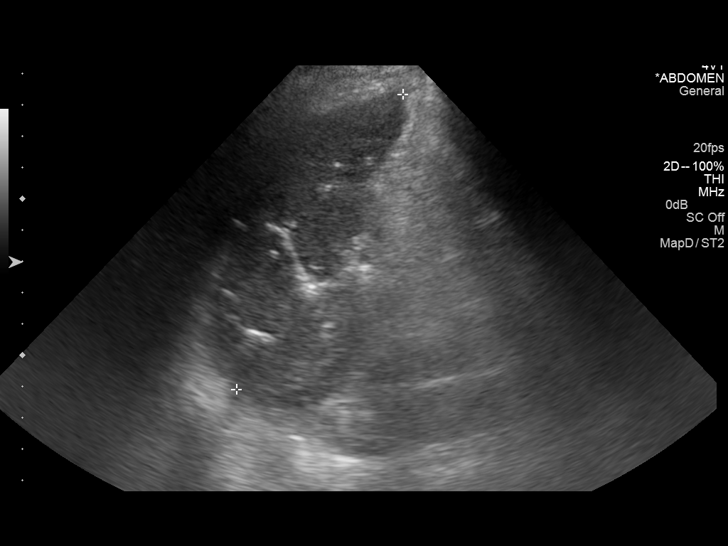
[im 37/41]
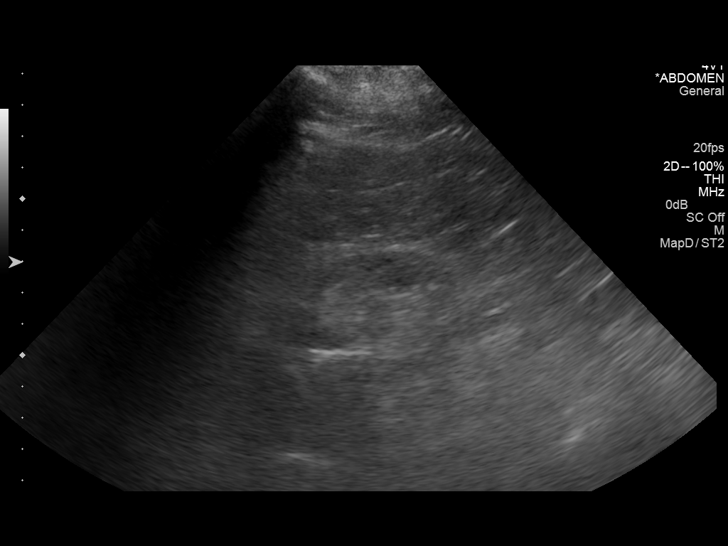
[im 41/41]
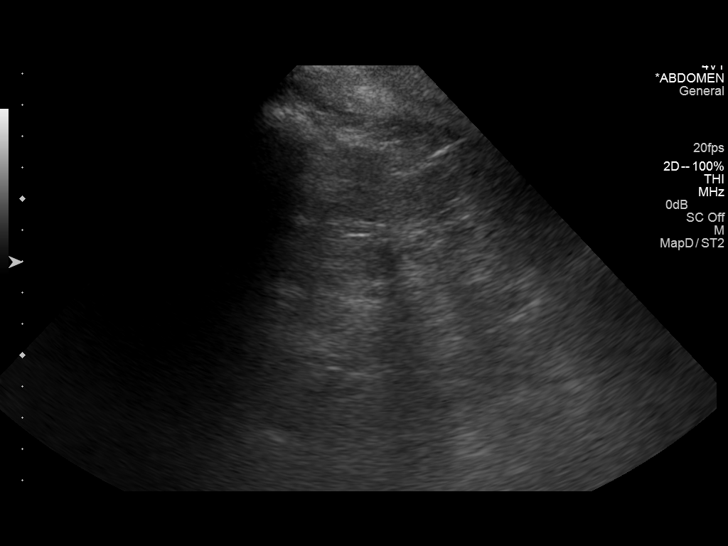

[13 of 25 positions shown; findings below may reference images not displayed]

FINDINGS: Gallbladder: Normally distended without stones or wall thickening.

No pericholecystic fluid or sonographic Murphy sign.

Common bile duct: Diameter: Upper normal caliber 6 mm diameter.

Liver: Normal appearance. Hepatopetal portal venous flow. No
intrahepatic biliary dilatation.

IVC: Normal appearance

Pancreas: Portions of body and tail normal appearance, remainder
obscured by bowel gas.

Spleen: Calcified granulomata within spleen. 5.9 cm length. No focal
mass.

Right Kidney: Length: 7.1 cm. Markedly atrophic and thinned with
echogenic cortex. Small cyst 2.0 x 1.9 x 1.6 cm. No gross
hydronephrosis or shadowing calcification.

Left Kidney: Length: 6.3 cm. Markedly atrophic with thin echogenic
cortex. No gross mass or hydronephrosis.

Abdominal aorta: Predominately obscured by bowel gas

Other findings: No free-fluid.  Small RIGHT pleural effusion
IMPRESSION: Atrophic kidneys consistent with history of end-stage renal disease.

Incomplete visualization of aorta and pancreas.

No acute abnormalities.
